# Patient Record
Sex: Female | Born: 1950 | Race: White | Hispanic: No | Smoking: Former smoker
Health system: Southern US, Community
[De-identification: ages and names within clinical notes are randomized; demographics above are authoritative.]

## PROBLEM LIST (undated history)

## (undated) DIAGNOSIS — Z720 Tobacco use: Secondary | ICD-10-CM

## (undated) DIAGNOSIS — R519 Headache, unspecified: Secondary | ICD-10-CM

## (undated) DIAGNOSIS — E559 Vitamin D deficiency, unspecified: Secondary | ICD-10-CM

## (undated) DIAGNOSIS — C801 Malignant (primary) neoplasm, unspecified: Secondary | ICD-10-CM

## (undated) DIAGNOSIS — F32A Depression, unspecified: Secondary | ICD-10-CM

## (undated) DIAGNOSIS — I10 Essential (primary) hypertension: Secondary | ICD-10-CM

## (undated) DIAGNOSIS — T7840XA Allergy, unspecified, initial encounter: Secondary | ICD-10-CM

## (undated) DIAGNOSIS — E7800 Pure hypercholesterolemia, unspecified: Secondary | ICD-10-CM

## (undated) DIAGNOSIS — G4730 Sleep apnea, unspecified: Secondary | ICD-10-CM

## (undated) DIAGNOSIS — K589 Irritable bowel syndrome without diarrhea: Secondary | ICD-10-CM

## (undated) DIAGNOSIS — R7303 Prediabetes: Secondary | ICD-10-CM

## (undated) DIAGNOSIS — J449 Chronic obstructive pulmonary disease, unspecified: Secondary | ICD-10-CM

## (undated) DIAGNOSIS — J45909 Unspecified asthma, uncomplicated: Secondary | ICD-10-CM

## (undated) DIAGNOSIS — K219 Gastro-esophageal reflux disease without esophagitis: Secondary | ICD-10-CM

## (undated) DIAGNOSIS — J40 Bronchitis, not specified as acute or chronic: Secondary | ICD-10-CM

## (undated) DIAGNOSIS — M1990 Unspecified osteoarthritis, unspecified site: Secondary | ICD-10-CM

## (undated) DIAGNOSIS — F419 Anxiety disorder, unspecified: Secondary | ICD-10-CM

## (undated) DIAGNOSIS — S52502A Unspecified fracture of the lower end of left radius, initial encounter for closed fracture: Secondary | ICD-10-CM

## (undated) HISTORY — PX: POLYPECTOMY: SHX149

## (undated) HISTORY — DX: Vitamin D deficiency, unspecified: E55.9

## (undated) HISTORY — DX: Malignant (primary) neoplasm, unspecified: C80.1

## (undated) HISTORY — PX: VAGINAL HYSTERECTOMY: SUR661

## (undated) HISTORY — DX: Tobacco use: Z72.0

## (undated) HISTORY — PX: APPENDECTOMY: SHX54

## (undated) HISTORY — DX: Allergy, unspecified, initial encounter: T78.40XA

## (undated) HISTORY — DX: Gastro-esophageal reflux disease without esophagitis: K21.9

## (undated) HISTORY — DX: Depression, unspecified: F32.A

## (undated) HISTORY — DX: Anxiety disorder, unspecified: F41.9

## (undated) HISTORY — DX: Chronic obstructive pulmonary disease, unspecified: J44.9

## (undated) HISTORY — DX: Unspecified osteoarthritis, unspecified site: M19.90

## (undated) HISTORY — DX: Headache, unspecified: R51.9

## (undated) HISTORY — DX: Irritable bowel syndrome, unspecified: K58.9

## (undated) HISTORY — DX: Bronchitis, not specified as acute or chronic: J40

---

## 1999-02-23 ENCOUNTER — Other Ambulatory Visit: Admission: RE | Admit: 1999-02-23 | Discharge: 1999-02-23 | Payer: Self-pay | Admitting: Obstetrics and Gynecology

## 1999-11-19 ENCOUNTER — Other Ambulatory Visit: Admission: RE | Admit: 1999-11-19 | Discharge: 1999-11-19 | Payer: Self-pay | Admitting: Obstetrics and Gynecology

## 2000-02-03 ENCOUNTER — Ambulatory Visit (HOSPITAL_COMMUNITY): Admission: RE | Admit: 2000-02-03 | Discharge: 2000-02-03 | Payer: Self-pay | Admitting: *Deleted

## 2000-05-07 ENCOUNTER — Emergency Department (HOSPITAL_COMMUNITY): Admission: EM | Admit: 2000-05-07 | Discharge: 2000-05-07 | Payer: Self-pay | Admitting: Emergency Medicine

## 2001-05-02 ENCOUNTER — Emergency Department (HOSPITAL_COMMUNITY): Admission: EM | Admit: 2001-05-02 | Discharge: 2001-05-02 | Payer: Self-pay

## 2001-07-09 ENCOUNTER — Encounter: Payer: Self-pay | Admitting: Emergency Medicine

## 2001-07-09 ENCOUNTER — Emergency Department (HOSPITAL_COMMUNITY): Admission: EM | Admit: 2001-07-09 | Discharge: 2001-07-10 | Payer: Self-pay | Admitting: Emergency Medicine

## 2003-10-08 ENCOUNTER — Ambulatory Visit (HOSPITAL_COMMUNITY): Admission: RE | Admit: 2003-10-08 | Discharge: 2003-10-08 | Payer: Self-pay | Admitting: *Deleted

## 2011-02-25 HISTORY — PX: ESOPHAGOGASTRODUODENOSCOPY: SHX1529

## 2012-08-05 ENCOUNTER — Emergency Department (INDEPENDENT_AMBULATORY_CARE_PROVIDER_SITE_OTHER): Payer: PRIVATE HEALTH INSURANCE

## 2012-08-05 ENCOUNTER — Encounter (HOSPITAL_COMMUNITY): Payer: Self-pay | Admitting: Emergency Medicine

## 2012-08-05 ENCOUNTER — Emergency Department (HOSPITAL_COMMUNITY)
Admission: EM | Admit: 2012-08-05 | Discharge: 2012-08-05 | Disposition: A | Discharge status: Home / Self Care | Payer: PRIVATE HEALTH INSURANCE | Source: Home / Self Care

## 2012-08-05 DIAGNOSIS — J40 Bronchitis, not specified as acute or chronic: Secondary | ICD-10-CM

## 2012-08-05 HISTORY — DX: Essential (primary) hypertension: I10

## 2012-08-05 HISTORY — DX: Unspecified asthma, uncomplicated: J45.909

## 2012-08-05 MED ORDER — ALBUTEROL SULFATE (5 MG/ML) 0.5% IN NEBU
5.0000 mg | INHALATION_SOLUTION | Freq: Once | RESPIRATORY_TRACT | Status: AC
  Administered 2012-08-05: 5 mg via RESPIRATORY_TRACT

## 2012-08-05 MED ORDER — HYDROCOD POLST-CHLORPHEN POLST 10-8 MG/5ML PO LQCR: 5.0000 mL | Freq: Two times a day (BID) | ORAL | Status: DC | PRN

## 2012-08-05 MED ORDER — ALBUTEROL SULFATE (5 MG/ML) 0.5% IN NEBU
INHALATION_SOLUTION | RESPIRATORY_TRACT | Status: AC
  Filled 2012-08-05: qty 1

## 2012-08-05 MED ORDER — ALBUTEROL SULFATE HFA 108 (90 BASE) MCG/ACT IN AERS: 2.0000 | INHALATION_SPRAY | Freq: Four times a day (QID) | RESPIRATORY_TRACT | Status: DC | PRN

## 2012-08-05 MED ORDER — IPRATROPIUM BROMIDE 0.02 % IN SOLN
0.5000 mg | Freq: Once | RESPIRATORY_TRACT | Status: AC
  Administered 2012-08-05: 0.5 mg via RESPIRATORY_TRACT

## 2012-08-05 MED ORDER — AZITHROMYCIN 250 MG PO TABS: 250.0000 mg | ORAL_TABLET | Freq: Every day | ORAL | Status: DC

## 2012-08-05 NOTE — ED Provider Notes (Signed)
Medical screening examination/treatment/procedure(s) were performed by non-physician practitioner and as supervising physician I was immediately available for consultation/collaboration.  Takyia Sindt, M.D.  Argie Lober C Trudie Cervantes, MD 08/05/12 1948 

## 2012-08-05 NOTE — ED Notes (Signed)
Waiting discharge papers 

## 2012-08-05 NOTE — ED Notes (Signed)
Pt c/o sinus pressure and pain. Productive cough. Sob. Wheezing. And low grade temp. Mild diarrhea. Denies nausea, vomiting, and chest pain.  Pt states that she has taken several otc meds with only mild relief of symptoms "symptoms will get better for a few days but then return" Symptoms present for almost the past two months.

## 2012-08-05 NOTE — ED Provider Notes (Signed)
History     CSN: [id number]  Arrival date & time 08/05/12  1151   First MD Initiated Contact with Patient 08/05/12 1240      Chief Complaint  Patient presents with  . URI    sinus pressure and pain. productive cough . sob. wheezing. decrease appetitie.     HPI: Patient is a 62 y.o. female presenting with URI. The history is provided by the patient.  URI Presenting symptoms: congestion, cough, fatigue, fever, rhinorrhea and sore throat   Presenting symptoms: no ear pain and no facial pain   Severity:  Moderate Onset quality:  Gradual Duration:  6 weeks Timing:  Intermittent Progression:  Unchanged Chronicity:  Recurrent Relieved by:  Nothing Associated symptoms: headaches, sneezing and wheezing   Associated symptoms: no myalgias, no neck pain, no sinus pain and no swollen glands   Risk factors: being elderly and chronic respiratory disease   Pt reports 6 wk h/o persistent cough and associated URI sx's. States she will improve then symptoms return. The cough is frequent and she feels "worn out" due to so muching coughing. Pt is a smoker and admits to h/o frequent Bronchitis. Has had intermittent low grade fever (99.9). Denies CP, N/V/D. States she has an episode last night where she coughed so hard and long she felt as if she would not be able to catch her breath.   Past Medical History  Diagnosis Date  . Hypertension   . Asthma     Past Surgical History  Procedure Laterality Date  . Appendectomy    . Abdominal hysterectomy      Family History  Problem Relation Age of Onset  . Heart failure Other   . Diabetes Other     History  Substance Use Topics  . Smoking status: Current Every Day Smoker -- 2.00 packs/day    Types: Cigarettes  . Smokeless tobacco: Not on file  . Alcohol Use: No    OB History   Grav Para Term Preterm Abortions TAB SAB Ect Mult Living                  Review of Systems  Constitutional: Positive for fever and fatigue.  HENT: Positive  for congestion, sore throat, rhinorrhea and sneezing. Negative for ear pain, nosebleeds, neck pain and ear discharge.   Eyes: Negative.   Respiratory: Positive for cough and wheezing. Negative for chest tightness.   Cardiovascular: Negative for chest pain.  Gastrointestinal: Negative.   Endocrine: Negative.   Genitourinary: Negative.   Musculoskeletal: Negative for myalgias.  Allergic/Immunologic: Negative.   Neurological: Positive for headaches.  Hematological: Negative.   Psychiatric/Behavioral: Negative.     Allergies  Penicillins  Home Medications   Current Outpatient Rx  Name  Route  Sig  Dispense  Refill  . citalopram (CELEXA) 20 MG tablet   Oral   Take 20 mg by mouth daily.         Marland Kitchen lisinopril-hydrochlorothiazide (PRINZIDE,ZESTORETIC) 20-25 MG per tablet   Oral   Take 1 tablet by mouth daily.         Marland Kitchen OMEPRAZOLE PO   Oral   Take by mouth.         Marland Kitchen albuterol (PROVENTIL HFA;VENTOLIN HFA) 108 (90 BASE) MCG/ACT inhaler   Inhalation   Inhale 2 puffs into the lungs every 6 (six) hours as needed for wheezing.   1 Inhaler   2   . azithromycin (ZITHROMAX Z-PAK) 250 MG tablet   Oral   Take 1  tablet (250 mg total) by mouth daily. Take 2 tabs on day 1 then 1 tab on days 2-5.   6 tablet   0   . chlorpheniramine-HYDROcodone (TUSSIONEX PENNKINETIC ER) 10-8 MG/5ML LQCR   Oral   Take 5 mLs by mouth every 12 (twelve) hours as needed.   30 mL   0     BP 154/76  Pulse 77  Temp(Src) 99.1 F (37.3 C) (Oral)  Resp 22  SpO2 99%  Physical Exam  Nursing note and vitals reviewed. Constitutional: She is oriented to person, place, and time. She appears well-developed and well-nourished.  HENT:  Head: Normocephalic and atraumatic.  Right Ear: Tympanic membrane, external ear and ear canal normal.  Left Ear: Tympanic membrane, external ear and ear canal normal.  Nose: Nose normal. Right sinus exhibits no maxillary sinus tenderness and no frontal sinus tenderness.  Left sinus exhibits no maxillary sinus tenderness and no frontal sinus tenderness.  Mouth/Throat: Uvula is midline, oropharynx is clear and moist and mucous membranes are normal.  Eyes: Conjunctivae are normal.  Neck: Neck supple.  Cardiovascular: Normal rate and regular rhythm.   Pulmonary/Chest: Effort normal. She has wheezes.  BBS coarsse with inspiratory and expiratory wheezes.  Musculoskeletal: Normal range of motion.  Neurological: She is alert and oriented to person, place, and time.  Skin: Skin is warm and dry.  Psychiatric: She has a normal mood and affect.    ED Course  Procedures (including critical care time)  Labs Reviewed - No data to display Dg Chest 2 View  08/05/2012  *RADIOLOGY REPORT*  Clinical Data: Cough, shortness of breath.  CHEST - 2 VIEW  Comparison: None  Findings: Heart and mediastinal contours are within normal limits. No focal opacities or effusions.  No acute bony abnormality.  IMPRESSION: No active cardiopulmonary disease.   Original Report Authenticated By: Charlett Nose, M.D.      1. Bronchitis       MDM  HPI and PE c/w Bronchitis. CXR w/o acute findings. BBS much improved w/ Albuterol/Atrovent neb. Will treat w/ Albuterol HFA, Z-Pack and short course of medication for cough to use primarily at night. Pt counseled regarding nedd to stop smoking. Info provided. Pt encouraged to continue her efforts at getting established w/ a PCP and until that time return here as needed.        Leanne Chang, NP 08/05/12 1511

## 2013-06-15 ENCOUNTER — Ambulatory Visit (INDEPENDENT_AMBULATORY_CARE_PROVIDER_SITE_OTHER): Payer: No Typology Code available for payment source | Admitting: Internal Medicine

## 2013-06-15 ENCOUNTER — Ambulatory Visit: Payer: No Typology Code available for payment source

## 2013-06-15 ENCOUNTER — Telehealth: Payer: Self-pay

## 2013-06-15 VITALS — BP 132/64 | HR 84 | Temp 98.7°F | Resp 16 | Ht 62.5 in

## 2013-06-15 DIAGNOSIS — R062 Wheezing: Secondary | ICD-10-CM

## 2013-06-15 DIAGNOSIS — E663 Overweight: Secondary | ICD-10-CM | POA: Insufficient documentation

## 2013-06-15 DIAGNOSIS — L519 Erythema multiforme, unspecified: Secondary | ICD-10-CM

## 2013-06-15 DIAGNOSIS — T7840XA Allergy, unspecified, initial encounter: Secondary | ICD-10-CM

## 2013-06-15 DIAGNOSIS — F17200 Nicotine dependence, unspecified, uncomplicated: Secondary | ICD-10-CM | POA: Insufficient documentation

## 2013-06-15 LAB — COMPREHENSIVE METABOLIC PANEL
BUN: 15 mg/dL (ref 6–23)
CO2: 25 mEq/L (ref 19–32)
Calcium: 9 mg/dL (ref 8.4–10.5)
Chloride: 102 mEq/L (ref 96–112)
Creat: 1.02 mg/dL (ref 0.50–1.10)
Glucose, Bld: 198 mg/dL — ABNORMAL HIGH (ref 70–99)

## 2013-06-15 LAB — POCT CBC
Hemoglobin: 10.3 g/dL — AB (ref 12.2–16.2)
Lymph, poc: 2.5 (ref 0.6–3.4)
MCH, POC: 21.7 pg — AB (ref 27–31.2)
MCV: 75.9 fL — AB (ref 80–97)
POC LYMPH PERCENT: 19.8 %L (ref 10–50)
Platelet Count, POC: 352 10*3/uL (ref 142–424)
RBC: 4.74 M/uL (ref 4.04–5.48)

## 2013-06-15 MED ORDER — EPINEPHRINE 0.3 MG/0.3ML IJ SOAJ
0.3000 mg | Freq: Once | INTRAMUSCULAR | Status: AC
  Administered 2013-06-15: 0.3 mg via SUBCUTANEOUS

## 2013-06-15 MED ORDER — METHYLPREDNISOLONE ACETATE 80 MG/ML IJ SUSP
80.0000 mg | Freq: Once | INTRAMUSCULAR | Status: AC
  Administered 2013-06-15: 80 mg via INTRAMUSCULAR

## 2013-06-15 MED ORDER — AZITHROMYCIN 250 MG PO TABS: 250.0000 mg | ORAL_TABLET | Freq: Every day | ORAL | Status: DC

## 2013-06-15 MED ORDER — PREDNISONE 20 MG PO TABS: ORAL_TABLET | ORAL | Status: DC

## 2013-06-15 NOTE — Patient Instructions (Signed)
Zyrtec  Twice a day for 5 days then once a day for 3 weeks.

## 2013-06-15 NOTE — Telephone Encounter (Signed)
Patient is wanting to make sure she received all the medications prescribed from her office visit today with Dr. Merla Riches. She received; predniSONE (DELTASONE) 20 MG tablet and  azithromycin (ZITHROMAX Z-PAK) 250 MG tablet She was confused on whether she is supposed to get the chlorpheniramine-HYDROcodone [facility]) 10-8 MG/5ML [facility]  also because she saw it on her AVS as current medications. Please advise, I told her we could check to make sure.   Best: [phone]

## 2013-06-15 NOTE — Progress Notes (Signed)
Subjective:    Patient ID: Tiffany Murphy, female    DOB: August 30, 1950, 62 y.o.   MRN: [id number]  HPI called by staff to see patient urgently as she was describing an allergic reaction that was beginning to affect her ability to breathe She has noted swelling and redness of her hands with red lesions on her arms for the past 36 hours This is very pruritic She has been exposed to lots of soaps while washing dishes and has a history of allergic reactions to various soap products She has also had tightness in her chest with an increased cough and some dyspnea on exertion for about a month No fever or night sweats No weight loss no new medications or other new exposures  There are no active problems to display for this patient.  she is a smoker with a history of reactive airway disease who is on Celexa for depression but no other chronic medications  Past Medical History  Diagnosis Date  . Hypertension   . Asthma        Review of Systems  Constitutional: Negative for fever, chills, activity change, appetite change, fatigue and unexpected weight change.  HENT: Negative for facial swelling, hearing loss, postnasal drip, rhinorrhea and sinus pressure.   Eyes: Negative for visual disturbance.  Respiratory: Negative for choking.   Cardiovascular: Negative for chest pain, palpitations and leg swelling.  Gastrointestinal: Negative for abdominal pain.  Genitourinary: Negative for difficulty urinating.  Musculoskeletal: Negative for arthralgias.       Objective:   Physical Exam BP 132/64  Pulse 84  Temp(Src) 98.7 F (37.1 C) (Oral)  Resp 16  Ht 5' 2.5" (1.588 m)  SpO2 96% She appears in no acute distress TMs clear/conjunctiva not inflamed/nares slightly boggy/throat clear/no nodes Chest with wheezing on inspiration and expiration bilaterally No exacerbation with wheezing on forced expiration and no rhonchi rales Heart regular without murmur No carotid bruits Abdomen  supple Skin exam reveals moderate swelling of both hands with target lesions on the palms and hives on the forearms A left forearm there is an area that has been irritated by her scratching which is much more purpuric-looking No regional adenopathy No peripheral edema Sensorium intact  Her rash did not respond to epinephrine but her lungs were more clear     [facility] reading (PRIMARY) by  Dr. Laney Pastor no acute distress  Results for orders placed in visit on 06/15/13  POCT CBC      Result Value Range   WBC 12.5 (*) 4.6 - 10.2 K/uL   Lymph, poc 2.5  0.6 - 3.4   POC LYMPH PERCENT 19.8  10 - 50 %L   MID (cbc) 0.6  0 - 0.9   POC MID % 5.2  0 - 12 %M   POC Granulocyte 9.4 (*) 2 - 6.9   Granulocyte percent 75.0  37 - 80 %G   RBC 4.74  4.04 - 5.48 M/uL   Hemoglobin 10.3 (*) 12.2 - 16.2 g/dL   HCT, POC 36.0 (*) 37.7 - 47.9 %   MCV 75.9 (*) 80 - 97 fL   MCH, POC 21.7 (*) 27 - 31.2 pg   MCHC 28.6 (*) 31.8 - 35.4 g/dL   RDW, POC 18.4     Platelet Count, POC 352  142 - 424 K/uL   MPV 8.8  0 - 99.8 fL       Assessment & Plan:  Allergic reaction, initial encounter - Plan: EPINEPHrine (EPI-PEN) injection 0.3 mg  Wheezing - Plan: DG Chest 2 View, methylPREDNISolone acetate (DEPO-MEDROL) injection 80 mg  ? Bronchitis exacerbation in a smoker Erythema multiforme - Plan: POCT CBC, Comprehensive metabolic panel, methylPREDNISolone acetate (DEPO-MEDROL) injection 80 mg  ? Viral reaction versus reaction to infection versus allergic reaction Smoker  Anemia-low MCV   Given 80 mg of Depo-Medrol in the office  Meds ordered this encounter  Medications  . EPINEPHrine (EPI-PEN) injection 0.3 mg    Sig:   . methylPREDNISolone acetate (DEPO-MEDROL) injection 80 mg    Sig:   . predniSONE (DELTASONE) 20 MG tablet    Sig: 3/3/2/2/1/1 single daily dose for 6 days    Dispense:  12 tablet    Refill:  0  . azithromycin (ZITHROMAX Z-PAK) 250 MG tablet    Sig: Take 1 tablet (250 mg total) by mouth  daily. Take 2 tabs on day 1 then 1 tab on days 2-5.    Dispense:  6 tablet    Refill:  0    Order Specific Question:  Supervising Provider    Answer:  Jeryl Columbia [[id number]]   Zyrtec 10 mg twice a day for 5 days and then once a day for one to 2 months Followup 48 hours Metabolic profile ordered She will need to address her problem with anemia and smoking and we will establish this in followup

## 2013-06-16 NOTE — Telephone Encounter (Signed)
Spoke with patient and was notified tussionex was medication that was prescribed in past and still showed on medication list

## 2013-07-11 ENCOUNTER — Other Ambulatory Visit: Payer: Self-pay | Admitting: *Deleted

## 2013-07-11 ENCOUNTER — Ambulatory Visit (INDEPENDENT_AMBULATORY_CARE_PROVIDER_SITE_OTHER): Payer: No Typology Code available for payment source | Admitting: Family Medicine

## 2013-07-11 ENCOUNTER — Encounter: Payer: Self-pay | Admitting: Family Medicine

## 2013-07-11 VITALS — BP 138/73 | HR 73 | Temp 98.2°F | Resp 16 | Ht 62.5 in | Wt 189.0 lb

## 2013-07-11 DIAGNOSIS — F341 Dysthymic disorder: Secondary | ICD-10-CM

## 2013-07-11 DIAGNOSIS — F418 Other specified anxiety disorders: Secondary | ICD-10-CM | POA: Insufficient documentation

## 2013-07-11 DIAGNOSIS — R739 Hyperglycemia, unspecified: Secondary | ICD-10-CM

## 2013-07-11 DIAGNOSIS — R7309 Other abnormal glucose: Secondary | ICD-10-CM

## 2013-07-11 DIAGNOSIS — I10 Essential (primary) hypertension: Secondary | ICD-10-CM | POA: Insufficient documentation

## 2013-07-11 DIAGNOSIS — E7800 Pure hypercholesterolemia, unspecified: Secondary | ICD-10-CM

## 2013-07-11 DIAGNOSIS — Z23 Encounter for immunization: Secondary | ICD-10-CM

## 2013-07-11 DIAGNOSIS — F17200 Nicotine dependence, unspecified, uncomplicated: Secondary | ICD-10-CM

## 2013-07-11 LAB — POCT GLYCOSYLATED HEMOGLOBIN (HGB A1C): Hemoglobin A1C: 5.9

## 2013-07-11 MED ORDER — LISINOPRIL-HYDROCHLOROTHIAZIDE 20-25 MG PO TABS
1.0000 | ORAL_TABLET | Freq: Every day | ORAL | Status: DC
Start: 2013-07-11 — End: 2013-09-10

## 2013-07-11 MED ORDER — CITALOPRAM HYDROBROMIDE 40 MG PO TABS: 40.0000 mg | ORAL_TABLET | Freq: Every day | ORAL | Status: DC

## 2013-07-11 MED ORDER — ALBUTEROL SULFATE HFA 108 (90 BASE) MCG/ACT IN AERS: 2.0000 | INHALATION_SPRAY | Freq: Four times a day (QID) | RESPIRATORY_TRACT | Status: DC | PRN

## 2013-07-11 NOTE — Progress Notes (Signed)
Subjective:    Patient ID: Tiffany Murphy, female    DOB: 1950-07-14, 63 y.o.   MRN: [id number]  HPI  This 63 y.o. Cauc female is here to establish care at [facility]. Previous care at [facility] in [place], [place]. Current medical problems include HTN, chronic depression and anxiety and nicotine addiction. She is using e-cigs for smoking cessation. Gained 30 lbs last time she quit smoking.  HTN- Pt out of medication 2 days but asymptomatic. No report of vision changes, diaphoresis, CP or tightness, palpitations, dizziness, numbness, facial asymmetry, weakness or syncope. Was advised that her blood sugar was high when she was seen at 89  In Dec 2014. No hx of DM, polyphagia or polydipsia. Has had polyuria w/o dysuria or urgency.  Chronic depression requiring medication for years; she has significant anxiety and feels "wound up". Relaxation- reading. Plans to get more active w/ her church group.   Hypercholesterolemia- The pt has been compliant w/ medications but stopped Lovastatin due to muscle cramps; mild cramps became severe when medication dose was increased.  Pt wants Flu vaccine but having mild throat irritation, frontal HA, itchy eyes and hx of allergies. She denies fever/ chills but has a slightly prod cough due to tobacco use.  Review of Systems  Constitutional: Positive for appetite change. Negative for diaphoresis and fatigue.  HENT: Positive for congestion, postnasal drip, sinus pressure and sore throat. Negative for ear pain and trouble swallowing.   Eyes: Positive for itching. Negative for redness and visual disturbance.  Cardiovascular: Negative.   Genitourinary: Positive for frequency.  Musculoskeletal: Positive for myalgias. Negative for arthralgias, back pain and gait problem.  Skin: Negative.   Neurological: Negative.   Hematological: Negative.   Psychiatric/Behavioral: Positive for sleep disturbance and dysphoric mood. Negative for suicidal ideas, behavioral problems,  confusion, self-injury and agitation. The patient is nervous/anxious. The patient is not hyperactive.        Objective:   Physical Exam  Nursing note and vitals reviewed. Constitutional: She is oriented to person, place, and time. Vital signs are normal. She appears well-developed and well-nourished. No distress.  HENT:  Head: Normocephalic and atraumatic.  Right Ear: Hearing, tympanic membrane, external ear and ear canal normal.  Left Ear: Hearing, tympanic membrane, external ear and ear canal normal.  Nose: Nose normal. No nasal deformity or septal deviation. Right sinus exhibits no maxillary sinus tenderness and no frontal sinus tenderness. Left sinus exhibits no maxillary sinus tenderness and no frontal sinus tenderness.  Mouth/Throat: Uvula is midline and mucous membranes are normal. No oral lesions. Dental caries present. No uvula swelling. Posterior oropharyngeal erythema present. No oropharyngeal exudate.  Eyes: Conjunctivae, EOM and lids are normal. Pupils are equal, round, and reactive to light. No scleral icterus.  Neck: Trachea normal and normal range of motion. Neck supple. No spinous process tenderness and no muscular tenderness present. No mass and no thyromegaly present.  Cardiovascular: Normal rate, regular rhythm, S1 normal, S2 normal and normal heart sounds.   No extrasystoles are present. PMI is not displaced.  Exam reveals no gallop and no friction rub.   No murmur heard. Pulmonary/Chest: Effort normal and breath sounds normal. No respiratory distress. She has no decreased breath sounds. She has no wheezes. She has no rhonchi.  Musculoskeletal: Normal range of motion. She exhibits no edema and no tenderness.  Neurological: She is alert and oriented to person, place, and time. She has normal strength. She displays no atrophy. No cranial nerve deficit or sensory  deficit. She exhibits normal muscle tone. Coordination and gait normal.  Skin: Skin is warm, dry and intact. No rash  noted. She is not diaphoretic. No cyanosis or erythema. No pallor.  Psychiatric: Her speech is normal and behavior is normal. Judgment and thought content normal. Her mood appears anxious. Her affect is not angry, not blunt and not inappropriate. Cognition and memory are normal. She does not exhibit a depressed mood.    Results for orders placed in visit on 07/11/13  POCT GLYCOSYLATED HEMOGLOBIN (HGB A1C)      Result Value Range   Hemoglobin A1C 5.9         Assessment & Plan:  Benign essential HTN-  RX: Lisinopril- HCTZ 20-25  1 tablet daily.  Depression with anxiety- Increase Citalopram to 40 mg; if this dose is too strong, reduce to 1/2 tablet.  Work on lifestyle changes- better nutrition and staying active.  Hyperglycemia - Advised about diet modifications and regular exercise.  Plan: POCT glycosylated hemoglobin (Hb A1C)  Nicotine addiction- Continue current plan but plan o discontinuing this product within few months.  Pure hypercholesterolemia- Will not prescribe another statin at this time due to side effect. Increase Fish Oil to 2 grams daily; dietary modifications as noted. Will check fasting lipids at future visit.  Need for prophylactic vaccination and inoculation against influenza - Plan: Flu Vaccine QUAD 36+ mos IM  Meds ordered this encounter  Medications  . cetirizine (ZYRTEC) 10 MG tablet    Sig: Take 10 mg by mouth daily.  . potassium gluconate 595 MG TABS tablet- OTC supplement    Sig: Take 595 mg by mouth.  . cyanocobalamin 2000 MCG tablet    Sig: Take 2,500 mcg by mouth daily.  . Fish Oil-Cholecalciferol (OMEGA-3 FISH OIL/VITAMIN D3 PO)    Sig: Take by mouth.  . calcium carbonate (OS-CAL) 600 MG TABS tablet    Sig: Take 600 mg by mouth 2 (two) times daily with a meal.  . albuterol (PROVENTIL HFA;VENTOLIN HFA) 108 (90 BASE) MCG/ACT inhaler    Sig: Inhale 2 puffs into the lungs every 6 (six) hours as needed for wheezing.    Dispense:  1 Inhaler    Refill:  2              . citalopram (CELEXA) 40 MG tablet    Sig: Take 1 tablet (40 mg total) by mouth daily.    Dispense:  30 tablet    Refill:  5   ROI signed for records from [facility] in [place], [place]. (requesting last 3 years of service).

## 2013-07-11 NOTE — Patient Instructions (Signed)
You Can Quit Smoking If you are ready to quit smoking or are thinking about it, congratulations! You have chosen to help yourself be healthier and live longer! There are lots of different ways to quit smoking. Nicotine gum, nicotine patches, a nicotine inhaler, or nicotine nasal spray can help with physical craving. Hypnosis, support groups, and medicines help break the habit of smoking. TIPS TO GET OFF AND STAY OFF CIGARETTES  Learn to predict your moods. Do not let a bad situation be your excuse to have a cigarette. Some situations in your life might tempt you to have a cigarette.  Ask friends and co-workers not to smoke around you.  Make your home smoke-free.  Never have "just one" cigarette. It leads to wanting another and another. Remind yourself of your decision to quit.  On a card, make a list of your reasons for not smoking. Read it at least the same number of times a day as you have a cigarette. Tell yourself everyday, "I do not want to smoke. I choose not to smoke."  Ask someone at home or work to help you with your plan to quit smoking.  Have something planned after you eat or have a cup of coffee. Take a walk or get other exercise to perk you up. This will help to keep you from overeating.  Try a relaxation exercise to calm you down and decrease your stress. Remember, you may be tense and nervous the first two weeks after you quit. This will pass.  Find new activities to keep your hands busy. Play with a pen, coin, or rubber band. Doodle or draw things on paper.  Brush your teeth right after eating. This will help cut down the craving for the taste of tobacco after meals. You can try mouthwash too.  Try gum, breath mints, or diet candy to keep something in your mouth. IF YOU SMOKE AND WANT TO QUIT:  Do not stock up on cigarettes. Never buy a carton. Wait until one pack is finished before you buy another.  Never carry cigarettes with you at work or at home.  Keep cigarettes  as far away from you as possible. Leave them with someone else.  Never carry matches or a lighter with you.  Ask yourself, "Do I need this cigarette or is this just a reflex?"  Bet with someone that you can quit. Put cigarette money in a piggy bank every morning. If you smoke, you give up the money. If you do not smoke, by the end of the week, you keep the money.  Keep trying. It takes 21 days to change a habit!  Talk to your doctor about using medicines to help you quit. These include nicotine replacement gum, lozenges, or skin patches. Document Released: 04/02/2009 Document Revised: 08/29/2011 Document Reviewed: 04/02/2009 [facility] Patient Information 2014 [place].    Exercise to Lose Weight Exercise and a healthy diet may help you lose weight. Your doctor may suggest specific exercises. EXERCISE IDEAS AND TIPS  Choose low-cost things you enjoy doing, such as walking, bicycling, or exercising to workout videos.  Take stairs instead of the elevator.  Walk during your lunch break.  Park your car further away from work or school.  Go to a gym or an exercise class.  Start with 5 to 10 minutes of exercise each day. Build up to 30 minutes of exercise 4 to 6 days a week.  Wear shoes with good support and comfortable clothes.  Stretch before and after  working out.  Work out until you breathe harder and your heart beats faster.  Drink extra water when you exercise.  Do not do so much that you hurt yourself, feel dizzy, or get very short of breath. Exercises that burn about 150 calories:  Running 1  miles in 15 minutes.  Playing volleyball for 45 to 60 minutes.  Washing and waxing a car for 45 to 60 minutes.  Playing touch football for 45 minutes.  Walking 1  miles in 35 minutes.  Pushing a stroller 1  miles in 30 minutes.  Playing basketball for 30 minutes.  Raking leaves for 30 minutes.  Bicycling 5 miles in 30 minutes.  Walking 2 miles in 30  minutes.  Dancing for 30 minutes.  Shoveling snow for 15 minutes.  Swimming laps for 20 minutes.  Walking up stairs for 15 minutes.  Bicycling 4 miles in 15 minutes.  Gardening for 30 to 45 minutes.  Jumping rope for 15 minutes.  Washing windows or floors for 45 to 60 minutes. Document Released: 07/09/2010 Document Revised: 08/29/2011 Document Reviewed: 07/09/2010 [facility] Patient Information 2014 [place], [place].   I have prescribed CELEXA 40 mg (Citalopram is the generic) 1 tablet every day to try to decrease your anxiety. If this dose is too high, reduce the dose back to 20 mg by taking 1/2 tablet. Work on better nutrition -more fruits and vegetables, less processed foods and fried foods, sodas and other sugary drinks and starchy foods (breads, rice, potatoes and pasta. Try to avoid unhealthy snack foods.  Try to get more active and get involved in your church again. Helping others always make you feel better!

## 2013-08-01 ENCOUNTER — Ambulatory Visit (INDEPENDENT_AMBULATORY_CARE_PROVIDER_SITE_OTHER): Payer: No Typology Code available for payment source | Admitting: Family Medicine

## 2013-08-01 VITALS — BP 146/79 | HR 85 | Temp 97.8°F | Resp 18 | Ht 60.75 in | Wt 182.4 lb

## 2013-08-01 DIAGNOSIS — A084 Viral intestinal infection, unspecified: Secondary | ICD-10-CM

## 2013-08-01 DIAGNOSIS — E860 Dehydration: Secondary | ICD-10-CM

## 2013-08-01 DIAGNOSIS — R197 Diarrhea, unspecified: Secondary | ICD-10-CM

## 2013-08-01 DIAGNOSIS — R110 Nausea: Secondary | ICD-10-CM

## 2013-08-01 LAB — POCT CBC
GRANULOCYTE PERCENT: 78.4 % (ref 37–80)
HCT, POC: 40.9 % (ref 37.7–47.9)
HEMOGLOBIN: 12 g/dL — AB (ref 12.2–16.2)
Lymph, poc: 1.2 (ref 0.6–3.4)
MCH: 22.1 pg — AB (ref 27–31.2)
MCHC: 29.3 g/dL — AB (ref 31.8–35.4)
MCV: 75.5 fL — AB (ref 80–97)
MID (CBC): 0.5 (ref 0–0.9)
MPV: 8.9 fL (ref 0–99.8)
PLATELET COUNT, POC: 351 10*3/uL (ref 142–424)
POC GRANULOCYTE: 6 (ref 2–6.9)
POC LYMPH PERCENT: 15.5 %L (ref 10–50)
POC MID %: 6.1 % (ref 0–12)
RBC: 5.42 M/uL (ref 4.04–5.48)
RDW, POC: 20.7 %
WBC: 7.7 10*3/uL (ref 4.6–10.2)

## 2013-08-01 LAB — GLUCOSE, POCT (MANUAL RESULT ENTRY): POC GLUCOSE: 79 mg/dL (ref 70–99)

## 2013-08-01 MED ORDER — ONDANSETRON 4 MG PO TBDP: ORAL_TABLET | ORAL | Status: DC

## 2013-08-01 MED ORDER — ONDANSETRON 4 MG PO TBDP
4.0000 mg | ORAL_TABLET | Freq: Once | ORAL | Status: AC
  Administered 2013-08-01: 4 mg via ORAL

## 2013-08-01 NOTE — Progress Notes (Signed)
Subjective: 63 year old lady who works at an [employer]. Yesterday morning she started having diarrhea. She had missed work yesterday and today. She has had severe diarrhea the whole time. She had nausea and vomiting yesterday, nauseated today. She has had chills and sweats but no documented fever. She has not eaten anything but drank a little Liquids which is she feels like just goes right through her. Active bowel sounds but not having major cramping or pain. She has a history of some GI problems and GERD for which she is on omeprazole. She tends toward having diarrheal stools anyhow. She took some Emetrol, no other medications directly for this. She says she is extremely thirsty.  Objective: Somewhat ill-appearing lady. Her TMs are normal. Throat clear. Neck supple without nodes. Chest is clear. Heart regular, slightly tachycardic. Abdomen has normal active bowel sounds, soft without masses or tenderness.  Assessment: Acute gastroenteritis with diarrhea and vomiting Mild to moderate dehydration Mild tachycardia  Plan: IV fluids CBC and glucose Zofran 4 mg by mouth

## 2013-08-01 NOTE — Patient Instructions (Signed)
Take Imodium 2 pills initially, then one every 6 hours as needed  Takes Zofran every 4-6 hours if needed for nausea and vomiting  Continue to try to stay well hydrated  Viral Gastroenteritis Viral gastroenteritis is also known as stomach flu. This condition affects the stomach and intestinal tract. It can cause sudden diarrhea and vomiting. The illness typically lasts 3 to 8 days. Most people develop an immune response that eventually gets rid of the virus. While this natural response develops, the virus can make you quite ill. CAUSES  Many different viruses can cause gastroenteritis, such as rotavirus or noroviruses. You can catch one of these viruses by consuming contaminated food or water. You may also catch a virus by sharing utensils or other personal items with an infected person or by touching a contaminated surface. SYMPTOMS  The most common symptoms are diarrhea and vomiting. These problems can cause a severe loss of body fluids (dehydration) and a body salt (electrolyte) imbalance. Other symptoms may include:  Fever.  Headache.  Fatigue.  Abdominal pain. DIAGNOSIS  Your caregiver can usually diagnose viral gastroenteritis based on your symptoms and a physical exam. A stool sample may also be taken to test for the presence of viruses or other infections. TREATMENT  This illness typically goes away on its own. Treatments are aimed at rehydration. The most serious cases of viral gastroenteritis involve vomiting so severely that you are not able to keep fluids down. In these cases, fluids must be given through an intravenous line (IV). HOME CARE INSTRUCTIONS   Drink enough fluids to keep your urine clear or pale yellow. Drink small amounts of fluids frequently and increase the amounts as tolerated.  Ask your caregiver for specific rehydration instructions.  Avoid:  Foods high in sugar.  Alcohol.  Carbonated drinks.  Tobacco.  Juice.  Caffeine drinks.  Extremely hot  or cold fluids.  Fatty, greasy foods.  Too much intake of anything at one time.  Dairy products until 24 to 48 hours after diarrhea stops.  You may consume probiotics. Probiotics are active cultures of beneficial bacteria. They may lessen the amount and number of diarrheal stools in adults. Probiotics can be found in yogurt with active cultures and in supplements.  Wash your hands well to avoid spreading the virus.  Only take over-the-counter or prescription medicines for pain, discomfort, or fever as directed by your caregiver. Do not give aspirin to children. Antidiarrheal medicines are not recommended.  Ask your caregiver if you should continue to take your regular prescribed and over-the-counter medicines.  Keep all follow-up appointments as directed by your caregiver. SEEK IMMEDIATE MEDICAL CARE IF:   You are unable to keep fluids down.  You do not urinate at least once every 6 to 8 hours.  You develop shortness of breath.  You notice blood in your stool or vomit. This may look like coffee grounds.  You have abdominal pain that increases or is concentrated in one small area (localized).  You have persistent vomiting or diarrhea.  You have a fever.  The patient is a child younger than 3 months, and he or she has a fever.  The patient is a child older than 3 months, and he or she has a fever and persistent symptoms.  The patient is a child older than 3 months, and he or she has a fever and symptoms suddenly get worse.  The patient is a baby, and he or she has no tears when crying. MAKE SURE YOU:  Understand these instructions.  Will watch your condition.  Will get help right away if you are not doing well or get worse. Document Released: 06/06/2005 Document Revised: 08/29/2011 Document Reviewed: 03/23/2011 [facility] Patient Information 2014 [place].

## 2013-08-14 ENCOUNTER — Encounter: Payer: Self-pay | Admitting: Family Medicine

## 2013-09-10 ENCOUNTER — Ambulatory Visit (INDEPENDENT_AMBULATORY_CARE_PROVIDER_SITE_OTHER): Payer: No Typology Code available for payment source | Admitting: Family Medicine

## 2013-09-10 ENCOUNTER — Encounter: Payer: Self-pay | Admitting: Family Medicine

## 2013-09-10 VITALS — BP 107/72 | HR 79 | Temp 98.2°F | Resp 16 | Ht 61.75 in | Wt 189.0 lb

## 2013-09-10 DIAGNOSIS — Z862 Personal history of diseases of the blood and blood-forming organs and certain disorders involving the immune mechanism: Secondary | ICD-10-CM

## 2013-09-10 DIAGNOSIS — Z8639 Personal history of other endocrine, nutritional and metabolic disease: Secondary | ICD-10-CM

## 2013-09-10 DIAGNOSIS — G8929 Other chronic pain: Secondary | ICD-10-CM

## 2013-09-10 DIAGNOSIS — I10 Essential (primary) hypertension: Secondary | ICD-10-CM

## 2013-09-10 DIAGNOSIS — R109 Unspecified abdominal pain: Secondary | ICD-10-CM

## 2013-09-10 MED ORDER — LISINOPRIL-HYDROCHLOROTHIAZIDE 20-25 MG PO TABS: 1.0000 | ORAL_TABLET | Freq: Every day | ORAL | Status: DC

## 2013-09-11 NOTE — Progress Notes (Signed)
S:  This 63 y.o. Cauc female is here for HTN follow-up. She is compliant w/ medication w/o adverse effects. Her weight is unchanged. She denies fatigue, diaphoresis, CP or tightness, palpitations, SOB or DOE, cough, orthopnea, edema, abd/back pain, HA, dizziness, asymmetric numbness, weakness or syncope.  She has hx of elevated cholesterol but not following TLCs (therapeutic lifestyle changes). She does not tolerate statins but is taking fish oil supplement.  Pt c/o chronic bilateral flank pain, not aggravated by coughing or sneezing. Certain movement do seem to cause discomfort and she endorses chronic mid-back pain. She does have intermittent nausea but no vomiting or change in stools. Hx of tobacco use. No hx of trauma.  Patient Active Problem List   Diagnosis Date Noted  . Benign essential HTN 07/11/2013  . Depression with anxiety 07/11/2013  . Pure hypercholesterolemia 07/11/2013  . Nicotine addiction 06/15/2013  . Overweight 06/15/2013   Prior to Admission medications   Medication Sig Start Date End Date Taking? Authorizing Provider  albuterol (PROVENTIL HFA;VENTOLIN HFA) 108 (90 BASE) MCG/ACT inhaler Inhale 2 puffs into the lungs every 6 (six) hours as needed for wheezing. 07/11/13  Yes Barton Fanny, MD  calcium carbonate (OS-CAL) 600 MG TABS tablet Take 600 mg by mouth 2 (two) times daily with a meal.   Yes Historical Provider, MD  cetirizine (ZYRTEC) 10 MG tablet Take 10 mg by mouth daily.   Yes Historical Provider, MD  citalopram (CELEXA) 40 MG tablet Take 1 tablet (40 mg total) by mouth daily. 07/11/13  Yes Barton Fanny, MD  Fish Oil-Cholecalciferol (OMEGA-3 FISH OIL/VITAMIN D3 PO) Take by mouth.   Yes Historical Provider, MD  lisinopril-hydrochlorothiazide (PRINZIDE,ZESTORETIC) 20-25 MG per tablet Take 1 tablet by mouth daily.   Yes Barton Fanny, MD  OMEPRAZOLE PO Take 20 mg by mouth.    Yes Historical Provider, MD  ondansetron (ZOFRAN ODT) 4 MG disintegrating  tablet Take one every 4-6 hours as needed for nausea or vomiting 08/01/13  Yes Posey Boyer, MD  potassium gluconate 595 MG TABS tablet Take 595 mg by mouth.   Yes Historical Provider, MD  cyanocobalamin 2000 MCG tablet Take 2,500 mcg by mouth daily.    Historical Provider, MD   PMHx, Surg Hx, Soc and Fam Hx reviewed.  ROS:As per HPI.  O: Filed Vitals:   09/10/13 1609  BP: 107/72  Pulse: 79  Temp: 98.2 F (36.8 C)  Resp: 16   GEN: In NAD: WN,WD. HENT: [place]/AT; EOMI w/ clear conj/sclerae. EACs/canals/nares clear. Post ph w/ mild erythema. COR: RRR. Normal S1 and S2.   LUNGS: CTA; Normal resp rate and effort. ABD: Soft and NT; normal BS. No guarding. Flank tenderness along ribs. BACK: Spine is straight; paravertebral muscles tender. NEURO: A&O x 3; CNs intact. Nonfocal.  A/P: Benign essential HTN- Stable on current medications.  History of hypercholesterolemia- Encouraged TLCs.  Advised A1c= 5.9%. Needs to address weight issue and improve nutrition.  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 Will do xrays at next visit.  Meds ordered this encounter  Medications  . lisinopril-hydrochlorothiazide (PRINZIDE,ZESTORETIC) 20-25 MG per tablet    Sig: Take 1 tablet by mouth daily.    Dispense:  30 tablet    Refill:  5

## 2014-01-22 ENCOUNTER — Encounter: Payer: Self-pay | Admitting: Family Medicine

## 2014-01-22 ENCOUNTER — Ambulatory Visit (INDEPENDENT_AMBULATORY_CARE_PROVIDER_SITE_OTHER): Payer: No Typology Code available for payment source | Admitting: Family Medicine

## 2014-01-22 VITALS — BP 124/68 | HR 71 | Temp 98.7°F | Resp 16 | Ht 62.0 in | Wt 176.0 lb

## 2014-01-22 DIAGNOSIS — R5381 Other malaise: Secondary | ICD-10-CM

## 2014-01-22 DIAGNOSIS — R51 Headache: Secondary | ICD-10-CM

## 2014-01-22 DIAGNOSIS — F418 Other specified anxiety disorders: Secondary | ICD-10-CM

## 2014-01-22 DIAGNOSIS — Z1231 Encounter for screening mammogram for malignant neoplasm of breast: Secondary | ICD-10-CM

## 2014-01-22 DIAGNOSIS — Z0000 Encounter for general adult medical examination without abnormal findings: Secondary | ICD-10-CM

## 2014-01-22 DIAGNOSIS — R5383 Other fatigue: Secondary | ICD-10-CM

## 2014-01-22 DIAGNOSIS — F341 Dysthymic disorder: Secondary | ICD-10-CM

## 2014-01-22 DIAGNOSIS — I10 Essential (primary) hypertension: Secondary | ICD-10-CM

## 2014-01-22 DIAGNOSIS — R7309 Other abnormal glucose: Secondary | ICD-10-CM

## 2014-01-22 LAB — POCT URINALYSIS DIPSTICK
Bilirubin, UA: NEGATIVE
Blood, UA: NEGATIVE
Glucose, UA: NEGATIVE
KETONES UA: NEGATIVE
Nitrite, UA: NEGATIVE
PH UA: 5
PROTEIN UA: NEGATIVE
Spec Grav, UA: 1.025
UROBILINOGEN UA: 0.2

## 2014-01-22 LAB — CBC WITH DIFFERENTIAL/PLATELET
BASOS PCT: 1 % (ref 0–1)
Basophils Absolute: 0.1 10*3/uL (ref 0.0–0.1)
Eosinophils Absolute: 0.2 10*3/uL (ref 0.0–0.7)
Eosinophils Relative: 3 % (ref 0–5)
HEMATOCRIT: 32.6 % — AB (ref 36.0–46.0)
HEMOGLOBIN: 10.1 g/dL — AB (ref 12.0–15.0)
LYMPHS PCT: 32 % (ref 12–46)
Lymphs Abs: 1.7 10*3/uL (ref 0.7–4.0)
MCH: 22.3 pg — ABNORMAL LOW (ref 26.0–34.0)
MCHC: 31 g/dL (ref 30.0–36.0)
MCV: 72.1 fL — AB (ref 78.0–100.0)
MONO ABS: 0.5 10*3/uL (ref 0.1–1.0)
MONOS PCT: 9 % (ref 3–12)
NEUTROS ABS: 2.9 10*3/uL (ref 1.7–7.7)
Neutrophils Relative %: 55 % (ref 43–77)
Platelets: 350 10*3/uL (ref 150–400)
RBC: 4.52 MIL/uL (ref 3.87–5.11)
RDW: 18 % — ABNORMAL HIGH (ref 11.5–15.5)
WBC: 5.2 10*3/uL (ref 4.0–10.5)

## 2014-01-22 LAB — BASIC METABOLIC PANEL WITH GFR
BUN: 24 mg/dL — AB (ref 6–23)
CHLORIDE: 106 meq/L (ref 96–112)
CO2: 26 mEq/L (ref 19–32)
Calcium: 9.1 mg/dL (ref 8.4–10.5)
Creat: 1.33 mg/dL — ABNORMAL HIGH (ref 0.50–1.10)
GFR, Est African American: 49 mL/min — ABNORMAL LOW
GFR, Est Non African American: 42 mL/min — ABNORMAL LOW
GLUCOSE: 100 mg/dL — AB (ref 70–99)
POTASSIUM: 4.2 meq/L (ref 3.5–5.3)
Sodium: 141 mEq/L (ref 135–145)

## 2014-01-22 LAB — THYROID PANEL WITH TSH
Free Thyroxine Index: 2.9 (ref 1.0–3.9)
T3 Uptake: 37.8 % — ABNORMAL HIGH (ref 22.5–37.0)
T4, Total: 7.7 ug/dL (ref 5.0–12.5)
TSH: 3.343 u[IU]/mL (ref 0.350–4.500)

## 2014-01-22 LAB — POCT GLYCOSYLATED HEMOGLOBIN (HGB A1C): HEMOGLOBIN A1C: 6

## 2014-01-22 LAB — SEDIMENTATION RATE: Sed Rate: 16 mm/hr (ref 0–22)

## 2014-01-22 MED ORDER — CITALOPRAM HYDROBROMIDE 40 MG PO TABS: 40.0000 mg | ORAL_TABLET | Freq: Every day | ORAL | Status: DC
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
## 2014-01-22 NOTE — Patient Instructions (Signed)

## 2014-01-23 LAB — VITAMIN D 25 HYDROXY (VIT D DEFICIENCY, FRACTURES): Vit D, 25-Hydroxy: 48 ng/mL (ref 30–89)

## 2014-01-24 NOTE — Progress Notes (Signed)
Quick Note:  Please add following labs: serum iron, TIBC, Ferritin and retic count. If there is a panel for "anemia" or "iron studies" , then just add that panel.  Thanks Angie!!!  ______

## 2014-02-13 ENCOUNTER — Ambulatory Visit: Payer: No Typology Code available for payment source | Admitting: Family Medicine

## 2014-05-01 ENCOUNTER — Ambulatory Visit: Payer: No Typology Code available for payment source | Admitting: Family Medicine

## 2014-06-06 ENCOUNTER — Other Ambulatory Visit: Payer: Self-pay | Admitting: Family Medicine

## 2014-09-20 ENCOUNTER — Other Ambulatory Visit: Payer: Self-pay | Admitting: Physician Assistant

## 2014-09-25 ENCOUNTER — Other Ambulatory Visit: Payer: Self-pay | Admitting: Physician Assistant

## 2014-10-28 ENCOUNTER — Other Ambulatory Visit: Payer: Self-pay | Admitting: Physician Assistant

## 2014-11-06 ENCOUNTER — Other Ambulatory Visit: Payer: Self-pay | Admitting: Physician Assistant

## 2014-11-26 ENCOUNTER — Telehealth: Payer: Self-pay | Admitting: Cardiology

## 2014-11-26 NOTE — Telephone Encounter (Signed)
11/26/2014 Received faxed records from [facility] on patient for upcoming appointment with Dr. Virginia Rochester on 12/01/2014 @ 8:30 am  Records given to [facility].  cbr

## 2014-12-01 ENCOUNTER — Ambulatory Visit (INDEPENDENT_AMBULATORY_CARE_PROVIDER_SITE_OTHER): Payer: Medicare HMO | Admitting: Cardiology

## 2014-12-01 ENCOUNTER — Encounter: Payer: Self-pay | Admitting: Cardiology

## 2014-12-01 VITALS — BP 138/78 | HR 77 | Ht 62.0 in | Wt 183.5 lb

## 2014-12-01 DIAGNOSIS — R931 Abnormal findings on diagnostic imaging of heart and coronary circulation: Secondary | ICD-10-CM | POA: Insufficient documentation

## 2014-12-01 DIAGNOSIS — I2510 Atherosclerotic heart disease of native coronary artery without angina pectoris: Secondary | ICD-10-CM | POA: Diagnosis not present

## 2014-12-01 MED ORDER — PRAVASTATIN SODIUM 40 MG PO TABS: 40.0000 mg | ORAL_TABLET | Freq: Every evening | ORAL | Status: DC

## 2014-12-01 NOTE — Progress Notes (Signed)
Cardiology Office Note   Date:  12/01/2014   ID:  Tiffany Murphy, DOB Jun 15, 1951, MRN [id number]  PCP:  No primary care provider on file.  Cardiologist:   Minus Breeding, MD   Chief Complaint  Patient presents with  . Coronary Calcium      History of Present Illness: Tiffany Murphy is a 64 y.o. female who presents for evaluation of coronary calcium. She has no history of coronary disease. However, recently while she was having a CT to evaluate possible lung mass she was found to have coronary calcium.  I do not have the images but this is described as left main and two-vessel coronary calcium. Of course this was not quantified. The patient denies any cardiovascular symptoms per se. She stays active at work. She doesn't exercise routinely. She gets fatigued and has to stop what she is doing. She describes her heart beating fast and diaphoresis but this has been long-standing. She denies any chest pressure, neck or arm discomfort. She does not have resting symptoms. She does not have overt shortness of breath, PND or orthopnea. She stops what she is doing and she recovers quickly. None of her symptoms are new.  Past Medical History  Diagnosis Date  . Hypertension   . Asthma   . COPD (chronic obstructive pulmonary disease)   . GERD (gastroesophageal reflux disease)   . Tobacco abuse     Past Surgical History  Procedure Laterality Date  . Appendectomy    . Vaginal hysterectomy       Current Outpatient Prescriptions  Medication Sig Dispense Refill  . albuterol (PROVENTIL HFA;VENTOLIN HFA) 108 (90 BASE) MCG/ACT inhaler Inhale 2 puffs into the lungs every 6 (six) hours as needed for wheezing. 1 Inhaler 2  . ALPRAZolam (XANAX) 0.25 MG tablet Take 1-2 tablets by mouth daily.    Jearl Klinefelter ELLIPTA 62.5-25 MCG/INH AEPB Take 1 puff by mouth daily.    . calcium carbonate (OS-CAL) 600 MG TABS tablet Take 600 mg by mouth 2 (two) times daily with a meal.    . cetirizine (ZYRTEC) 10 MG tablet  Take 10 mg by mouth daily.    . Fish Oil-Cholecalciferol (OMEGA-3 FISH OIL/VITAMIN D3 PO) Take 2,000 mg by mouth daily.     . fluorometholone (FML) 0.1 % ophthalmic suspension Place 1 drop into both eyes daily.    Marland Kitchen gabapentin (NEURONTIN) 300 MG capsule Take 2 capsules by mouth daily.    Marland Kitchen lisinopril-hydrochlorothiazide (PRINZIDE,ZESTORETIC) 20-25 MG per tablet TAKE ONE TABLET BY MOUTH ONCE DAILY "OV NEEDED FOR ADDITIONAL VISITS" 30 tablet 0  . mupirocin ointment (BACTROBAN) 2 % Apply 1 application topically daily.    Marland Kitchen omeprazole (PRILOSEC) 40 MG capsule Take 1 capsule by mouth daily.    . potassium gluconate 595 MG TABS tablet Take 595 mg by mouth.    . RESTASIS 0.05 % ophthalmic emulsion Place 1 drop into both eyes as needed.    . sertraline (ZOLOFT) 50 MG tablet Take 50 mg by mouth daily.    . SYMBICORT 160-4.5 MCG/ACT inhaler Take 2 puffs by mouth daily.    . pravastatin (PRAVACHOL) 40 MG tablet Take 1 tablet (40 mg total) by mouth every evening. 30 tablet 6   No current facility-administered medications for this visit.    Allergies:   Lovastatin; Other; and Penicillins    Social History:  The patient  reports that she has been smoking Cigarettes.  She has a 88 pack-year smoking history. She does not have  any smokeless tobacco history on file. She reports that she does not drink alcohol or use illicit drugs.   Family History:  The patient's family history includes Alzheimer's disease (age of onset: 87) in her father; CAD (age of onset: 62) in her mother; Diabetes in her father.    ROS:  Please see the history of present illness.   Otherwise, review of systems are positive for joint pains, body aches, cough, headaches, dizziness..   All other systems are reviewed and negative.    PHYSICAL EXAM: VS:  BP 138/78 mmHg  Pulse 77  Ht 5\' 2"  (1.575 m)  Wt 183 lb 8 oz (83.235 kg)  BMI 33.55 kg/m2 , BMI Body mass index is 33.55 kg/(m^2). GENERAL:  Well appearing HEENT:  Pupils equal round  and reactive, fundi not visualized, oral mucosa unremarkable, dentures.   NECK:  No jugular venous distention, waveform within normal limits, carotid upstroke brisk and symmetric, no bruits, no thyromegaly LYMPHATICS:  No cervical, inguinal adenopathy LUNGS:  Clear to auscultation bilaterally BACK:  No CVA tenderness CHEST:  Unremarkable HEART:  PMI not displaced or sustained,S1 and S2 within normal limits, no S3, no S4, no clicks, no rubs, no murmurs ABD:  Flat, positive bowel sounds normal in frequency in pitch, no bruits, no rebound, no guarding, no midline pulsatile mass, no hepatomegaly, no splenomegaly EXT:  2 plus pulses throughout, no edema, no cyanosis no clubbing SKIN:  No rashes no nodules NEURO:  Cranial nerves II through XII grossly intact, motor grossly intact throughout PSYCH:  Cognitively intact, oriented to person place and time    EKG:  EKG is ordered today. The ekg ordered today demonstrates sinus rhythm, rate 77, axis within normal limits, poor anterior R wave progression, no acute ST-T wave changes.   Recent Labs: 01/22/2014: BUN 24*; Creat 1.33*; Hemoglobin 10.1*; Platelets 350; Potassium 4.2; Sodium 141; TSH 3.343    Lipid Panel No results found for: CHOL, TRIG, HDL, CHOLHDL, VLDL, LDLCALC, LDLDIRECT    Wt Readings from Last 3 Encounters:  12/01/14 183 lb 8 oz (83.235 kg)  01/22/14 176 lb (79.833 kg)  09/10/13 189 lb (85.73 kg)      Other studies Reviewed: Additional studies/ records that were reviewed today include: Office records and lipids. Review of the above records demonstrates:  Please see elsewhere in the note.     ASSESSMENT AND PLAN:  CAD:  The patient has known coronary calcium. She has no symptoms however. I'm going to screen her with a POET (Plain Old Exercise Treadmill).  We talked a great deal about primary risk reduction.  I think it would be prudent to screen her yearly with stress testing. Should she have any symptoms in the future I  will have a low threshold for further evaluation.  TOBACCO ABUSE:  She has tried multiple things including Chantix and patches. I suggested lozenges. I gave her telephone numbers.  We discussed a specific strategy for tobacco cessation.  (Greater than three minutes discussing tobacco cessation.)  DYSLIPIDEMIA:  Given her risk factors I think her LDL should be at least less than 100 and it was recently 140. She had problems with Mevacor in the past.  I will start Pravachol 40 mg daily.  . Current medicines are reviewed at length with the patient today.  The patient does not have concerns regarding medicines.  The following changes have been made:  Danelle Earthly  Labs/ tests ordered today include:   Orders Placed This Encounter  Procedures  .  Exercise Tolerance Test  . EKG 12-Lead   Disposition:   FU with me in one year.    Signed, Minus Breeding, MD  12/01/2014 9:53 AM    [place]

## 2014-12-01 NOTE — Patient Instructions (Addendum)
Your physician recommends that you schedule a follow-up appointment in: [place] has requested that you have an exercise tolerance test. For further information please visit [email or web address]. Please also follow instruction sheet, as given.  Your physician has recommended you make the following change in your medication: Start Pravastatin 40 mg daily'

## 2014-12-31 ENCOUNTER — Telehealth (HOSPITAL_COMMUNITY): Payer: Self-pay

## 2014-12-31 NOTE — Telephone Encounter (Signed)
Encounter complete. 

## 2015-01-01 ENCOUNTER — Telehealth (HOSPITAL_COMMUNITY): Payer: Self-pay

## 2015-01-01 NOTE — Telephone Encounter (Signed)
Encounter complete. 

## 2015-01-02 ENCOUNTER — Ambulatory Visit (HOSPITAL_COMMUNITY)
Admission: RE | Admit: 2015-01-02 | Discharge: 2015-01-02 | Disposition: A | Discharge status: Home / Self Care | Payer: Medicare HMO | Source: Ambulatory Visit | Attending: Cardiology | Admitting: Cardiology

## 2015-01-02 DIAGNOSIS — I2510 Atherosclerotic heart disease of native coronary artery without angina pectoris: Secondary | ICD-10-CM

## 2015-01-02 DIAGNOSIS — R9439 Abnormal result of other cardiovascular function study: Secondary | ICD-10-CM | POA: Diagnosis not present

## 2015-01-02 DIAGNOSIS — R931 Abnormal findings on diagnostic imaging of heart and coronary circulation: Secondary | ICD-10-CM

## 2015-01-02 LAB — EXERCISE TOLERANCE TEST
CHL RATE OF PERCEIVED EXERTION: 15
Estimated workload: 6.7 METS
Exercise duration (min): 4 min
Exercise duration (sec): 49 s
MPHR: 155 {beats}/min
Peak HR: 123 {beats}/min
Percent HR: 79 %
Rest HR: 69 {beats}/min

## 2015-01-05 ENCOUNTER — Other Ambulatory Visit: Payer: Self-pay | Admitting: *Deleted

## 2015-01-05 DIAGNOSIS — R931 Abnormal findings on diagnostic imaging of heart and coronary circulation: Secondary | ICD-10-CM

## 2015-01-14 ENCOUNTER — Telehealth: Payer: Self-pay | Admitting: Cardiology

## 2015-01-14 ENCOUNTER — Other Ambulatory Visit: Payer: Self-pay | Admitting: *Deleted

## 2015-01-14 DIAGNOSIS — R931 Abnormal findings on diagnostic imaging of heart and coronary circulation: Secondary | ICD-10-CM

## 2015-01-14 NOTE — Telephone Encounter (Signed)
Follow up:   Per pt she was told she would get a call back to re-do her EXTOL. TEST.   Please give her a call.

## 2015-01-14 NOTE — Telephone Encounter (Signed)
Release myoview  Per scheduler - unable to see order. Called left message on patient's voicemail Left message scheduler will contact patient with results.

## 2015-01-14 NOTE — Telephone Encounter (Signed)
Called and left message for patient to call and schedule nuclear stress test ordered by Dr. Percival Spanish

## 2015-01-16 ENCOUNTER — Other Ambulatory Visit: Payer: Self-pay | Admitting: *Deleted

## 2015-01-16 DIAGNOSIS — R931 Abnormal findings on diagnostic imaging of heart and coronary circulation: Secondary | ICD-10-CM

## 2015-01-20 ENCOUNTER — Telehealth (HOSPITAL_COMMUNITY): Payer: Self-pay

## 2015-01-20 NOTE — Telephone Encounter (Signed)
Encounter complete. 

## 2015-01-21 ENCOUNTER — Telehealth (HOSPITAL_COMMUNITY): Payer: Self-pay

## 2015-01-21 NOTE — Telephone Encounter (Signed)
Encounter complete. 

## 2015-01-22 ENCOUNTER — Ambulatory Visit (HOSPITAL_COMMUNITY)
Admission: RE | Admit: 2015-01-22 | Discharge: 2015-01-22 | Disposition: A | Discharge status: Home / Self Care | Payer: Medicare HMO | Source: Ambulatory Visit | Attending: Cardiovascular Disease | Admitting: Cardiovascular Disease

## 2015-01-22 DIAGNOSIS — I2510 Atherosclerotic heart disease of native coronary artery without angina pectoris: Secondary | ICD-10-CM | POA: Insufficient documentation

## 2015-01-22 DIAGNOSIS — R931 Abnormal findings on diagnostic imaging of heart and coronary circulation: Secondary | ICD-10-CM

## 2015-01-22 LAB — MYOCARDIAL PERFUSION IMAGING
CHL CUP RESTING HR STRESS: 66 {beats}/min
LV dias vol: 83 mL
LV sys vol: 32 mL
Peak HR: 82 {beats}/min
SDS: 1
SRS: 2
SSS: 3
TID: 1.27

## 2015-01-22 MED ORDER — REGADENOSON 0.4 MG/5ML IV SOLN
0.4000 mg | Freq: Once | INTRAVENOUS | Status: AC
  Administered 2015-01-22: 0.4 mg via INTRAVENOUS

## 2015-01-22 MED ORDER — TECHNETIUM TC 99M SESTAMIBI GENERIC - CARDIOLITE
32.8000 | Freq: Once | INTRAVENOUS | Status: AC | PRN
  Administered 2015-01-22: 32.8 via INTRAVENOUS

## 2015-01-22 MED ORDER — TECHNETIUM TC 99M SESTAMIBI GENERIC - CARDIOLITE
10.2000 | Freq: Once | INTRAVENOUS | Status: AC | PRN
  Administered 2015-01-22: 10 via INTRAVENOUS

## 2015-07-21 ENCOUNTER — Other Ambulatory Visit: Payer: Self-pay | Admitting: Cardiology

## 2015-08-19 ENCOUNTER — Other Ambulatory Visit: Payer: Self-pay | Admitting: Cardiology

## 2015-08-20 NOTE — Telephone Encounter (Signed)
REFILL 

## 2015-08-25 LAB — HM COLONOSCOPY

## 2015-08-26 HISTORY — PX: COLONOSCOPY: SHX174

## 2016-02-10 ENCOUNTER — Other Ambulatory Visit: Payer: Self-pay | Admitting: Oncology

## 2016-03-07 ENCOUNTER — Other Ambulatory Visit: Payer: Self-pay | Admitting: *Deleted

## 2016-03-07 MED ORDER — PRAVASTATIN SODIUM 40 MG PO TABS: 40.0000 mg | ORAL_TABLET | Freq: Every evening | ORAL | 1 refills | Status: DC

## 2016-03-07 NOTE — Telephone Encounter (Signed)
called to change pharmacy to [facility], did inform pt taht she needs an OV, she is over due, transffered to scheduling to get that appt.

## 2016-04-07 ENCOUNTER — Encounter: Payer: Self-pay | Admitting: Cardiology

## 2016-04-20 NOTE — Progress Notes (Deleted)
Cardiology Office Note   Date:  04/20/2016   ID:  Tiffany Murphy, DOB [birth year], MRN [id number]  PCP:  Imagene Riches, NP  Cardiologist:   Minus Breeding, MD   No chief complaint on file.     History of Present Illness: Tiffany Murphy is a 65 y.o. female who presents for evaluation of coronary calcium. She has had a CT to evaluate possible lung mass and she was found to have coronary calcium.  I do not have the images but this is described as left main and two-vessel coronary calcium. Of course this was not quantified.  She had an inadequate POET (Plain Old Exercise Treadmill).   Stress test demonstrated no evidence of ischemia or infarct and EF is normal. She was instructed to follow up with me yearly. ***   She should have follow up with me yearly or if she has any chest pain symptoms. However, she does not have any evidence of obstructive disease from this test. Call [name] with the results and send results to Imagene Riches, NP  Given the patient's known coronary calcium and this test as being negative but inadequate she will need to come back for pharmacologic stress testing. Please call the patient and schedule Lexiscan Myoview.  The patient denies any cardiovascular symptoms per se. She stays active at work. She doesn't exercise routinely. She gets fatigued and has to stop what she is doing. She describes her heart beating fast and diaphoresis but this has been long-standing. She denies any chest pressure, neck or arm discomfort. She does not have resting symptoms. She does not have overt shortness of breath, PND or orthopnea. She stops what she is doing and she recovers quickly. None of her symptoms are new.  Past Medical History:  Diagnosis Date  . Asthma   . COPD (chronic obstructive pulmonary disease) ([place])   . GERD (gastroesophageal reflux disease)   . Hypertension   . Tobacco abuse     Past Surgical History:  Procedure Laterality Date  . APPENDECTOMY    . VAGINAL  HYSTERECTOMY       Current Outpatient Prescriptions  Medication Sig Dispense Refill  . albuterol (PROVENTIL HFA;VENTOLIN HFA) 108 (90 BASE) MCG/ACT inhaler Inhale 2 puffs into the lungs every 6 (six) hours as needed for wheezing. 1 Inhaler 2  . ALPRAZolam (XANAX) 0.25 MG tablet Take 1-2 tablets by mouth daily.    Jearl Klinefelter ELLIPTA 62.5-25 MCG/INH AEPB Take 1 puff by mouth daily.    . calcium carbonate (OS-CAL) 600 MG TABS tablet Take 600 mg by mouth 2 (two) times daily with a meal.    . cetirizine (ZYRTEC) 10 MG tablet Take 10 mg by mouth daily.    . Fish Oil-Cholecalciferol (OMEGA-3 FISH OIL/VITAMIN D3 PO) Take 2,000 mg by mouth daily.     . fluorometholone (FML) 0.1 % ophthalmic suspension Place 1 drop into both eyes daily.    Marland Kitchen gabapentin (NEURONTIN) 300 MG capsule Take 2 capsules by mouth daily.    Marland Kitchen lisinopril-hydrochlorothiazide (PRINZIDE,ZESTORETIC) 20-25 MG per tablet TAKE ONE TABLET BY MOUTH ONCE DAILY "OV NEEDED FOR ADDITIONAL VISITS" 30 tablet 0  . mupirocin ointment (BACTROBAN) 2 % Apply 1 application topically daily.    Marland Kitchen omeprazole (PRILOSEC) 40 MG capsule Take 1 capsule by mouth daily.    . potassium gluconate 595 MG TABS tablet Take 595 mg by mouth.    . pravastatin (PRAVACHOL) 40 MG tablet Take 1 tablet (40 mg total) by mouth every evening.  30 tablet 1  . RESTASIS 0.05 % ophthalmic emulsion Place 1 drop into both eyes as needed.    . sertraline (ZOLOFT) 50 MG tablet Take 50 mg by mouth daily.    . SYMBICORT 160-4.5 MCG/ACT inhaler Take 2 puffs by mouth daily.     No current facility-administered medications for this visit.     Allergies:   Lovastatin; Other; and Penicillins    ROS:  Please see the history of present illness.   Otherwise, review of systems are positive for joint pains, body aches, cough, headaches, dizziness ***..   All other systems are reviewed and negative.    PHYSICAL EXAM: VS:  There were no vitals taken for this visit. , BMI There is no height or  weight on file to calculate BMI. GENERAL:  Well appearing HEENT:  Pupils equal round and reactive, fundi not visualized, oral mucosa unremarkable, dentures.   NECK:  No jugular venous distention, waveform within normal limits, carotid upstroke brisk and symmetric, no bruits, no thyromegaly LYMPHATICS:  No cervical, inguinal adenopathy LUNGS:  Clear to auscultation bilaterally BACK:  No CVA tenderness CHEST:  Unremarkable HEART:  PMI not displaced or sustained,S1 and S2 within normal limits, no S3, no S4, no clicks, no rubs, no murmurs ABD:  Flat, positive bowel sounds normal in frequency in pitch, no bruits, no rebound, no guarding, no midline pulsatile mass, no hepatomegaly, no splenomegaly EXT:  2 plus pulses throughout, no edema, no cyanosis no clubbing   EKG:  EKG is ordered today. The ekg ordered today demonstrates sinus rhythm, rate 77, axis within normal limits, poor anterior R wave progression, no acute ST-T wave changes.   Recent Labs: No results found for requested labs within last 8760 hours.    Lipid Panel No results found for: CHOL, TRIG, HDL, CHOLHDL, VLDL, LDLCALC, LDLDIRECT    Wt Readings from Last 3 Encounters:  01/22/15 183 lb (83 kg)  12/01/14 183 lb 8 oz (83.2 kg)  01/22/14 176 lb (79.8 kg)      Other studies Reviewed: Additional studies/ records that were reviewed today include: *** Review of the above records demonstrates:  ***    ASSESSMENT AND PLAN:  CAD:  The patient has known coronary calcium.   ***  TOBACCO ABUSE:  She has tried multiple things including Chantix and patches. I suggested lozenges. I gave her telephone numbers.  We discussed a specific strategy for tobacco cessation.  (Greater than three minutes discussing tobacco cessation.)  DYSLIPIDEMIA:  ***.  Current medicines are reviewed at length with the patient today.  The patient does not have concerns regarding medicines.  The following changes have been made:  ***  Labs/ tests  ordered today include:  ***  No orders of the defined types were placed in this encounter.  Disposition:   FU with me ***  Signed, Minus Breeding, MD  04/20/2016 9:14 PM    [place]

## 2016-04-21 ENCOUNTER — Ambulatory Visit: Payer: Medicare HMO | Admitting: Cardiology

## 2016-06-11 ENCOUNTER — Other Ambulatory Visit: Payer: Self-pay | Admitting: Cardiology

## 2016-06-14 NOTE — Telephone Encounter (Signed)
REFILL 

## 2017-08-15 ENCOUNTER — Ambulatory Visit: Payer: Medicare HMO | Admitting: Neurology

## 2017-09-14 ENCOUNTER — Ambulatory Visit: Payer: Medicare HMO | Admitting: Neurology

## 2017-10-02 ENCOUNTER — Telehealth: Payer: Self-pay | Admitting: Gastroenterology

## 2017-10-03 NOTE — Telephone Encounter (Signed)
Pl refill dicyclomine 10mg  po qid  #120 with 6 refills

## 2017-10-03 NOTE — Telephone Encounter (Signed)
Would you like to give her refills? If so directions and how many refills?

## 2017-10-04 MED ORDER — DICYCLOMINE HCL 10 MG PO CAPS: 10.0000 mg | ORAL_CAPSULE | Freq: Four times a day (QID) | ORAL | 6 refills | Status: DC

## 2017-10-04 NOTE — Telephone Encounter (Signed)
Sent refill into patients pharmacy.  

## 2018-01-03 ENCOUNTER — Emergency Department (HOSPITAL_COMMUNITY)
Admission: EM | Admit: 2018-01-03 | Discharge: 2018-01-03 | Disposition: A | Discharge status: Home / Self Care | Payer: Medicare HMO | Attending: Emergency Medicine | Admitting: Emergency Medicine

## 2018-01-03 ENCOUNTER — Other Ambulatory Visit: Payer: Self-pay

## 2018-01-03 ENCOUNTER — Emergency Department (HOSPITAL_COMMUNITY): Payer: Medicare HMO

## 2018-01-03 ENCOUNTER — Encounter (HOSPITAL_COMMUNITY): Payer: Self-pay | Admitting: Emergency Medicine

## 2018-01-03 DIAGNOSIS — J441 Chronic obstructive pulmonary disease with (acute) exacerbation: Secondary | ICD-10-CM | POA: Diagnosis not present

## 2018-01-03 DIAGNOSIS — R51 Headache: Secondary | ICD-10-CM | POA: Insufficient documentation

## 2018-01-03 DIAGNOSIS — F17210 Nicotine dependence, cigarettes, uncomplicated: Secondary | ICD-10-CM | POA: Insufficient documentation

## 2018-01-03 DIAGNOSIS — R519 Headache, unspecified: Secondary | ICD-10-CM

## 2018-01-03 DIAGNOSIS — I10 Essential (primary) hypertension: Secondary | ICD-10-CM | POA: Insufficient documentation

## 2018-01-03 DIAGNOSIS — Z79899 Other long term (current) drug therapy: Secondary | ICD-10-CM | POA: Insufficient documentation

## 2018-01-03 DIAGNOSIS — R05 Cough: Secondary | ICD-10-CM | POA: Diagnosis present

## 2018-01-03 LAB — CBC
HEMATOCRIT: 43.2 % (ref 36.0–46.0)
Hemoglobin: 13.9 g/dL (ref 12.0–15.0)
MCH: 30.9 pg (ref 26.0–34.0)
MCHC: 32.2 g/dL (ref 30.0–36.0)
MCV: 96 fL (ref 78.0–100.0)
Platelets: 209 10*3/uL (ref 150–400)
RBC: 4.5 MIL/uL (ref 3.87–5.11)
RDW: 13.5 % (ref 11.5–15.5)
WBC: 7.5 10*3/uL (ref 4.0–10.5)

## 2018-01-03 LAB — BASIC METABOLIC PANEL
Anion gap: 10 (ref 5–15)
BUN: 13 mg/dL (ref 8–23)
CHLORIDE: 107 mmol/L (ref 98–111)
CO2: 26 mmol/L (ref 22–32)
Calcium: 9.4 mg/dL (ref 8.9–10.3)
Creatinine, Ser: 1.01 mg/dL — ABNORMAL HIGH (ref 0.44–1.00)
GFR calc Af Amer: >60 mL/min (ref >60)
GFR calc non Af Amer: 56 mL/min — ABNORMAL LOW (ref >60)
GLUCOSE: 152 mg/dL — AB (ref 70–99)
POTASSIUM: 4.3 mmol/L (ref 3.5–5.1)
SODIUM: 143 mmol/L (ref 135–145)

## 2018-01-03 MED ORDER — DOXYCYCLINE HYCLATE 100 MG PO TABS
100.0000 mg | ORAL_TABLET | Freq: Once | ORAL | Status: AC
  Administered 2018-01-03: 100 mg via ORAL
  Filled 2018-01-03: qty 1

## 2018-01-03 MED ORDER — SODIUM CHLORIDE 0.9 % IV BOLUS
500.0000 mL | Freq: Once | INTRAVENOUS | Status: AC
  Administered 2018-01-03: 500 mL via INTRAVENOUS

## 2018-01-03 MED ORDER — FENTANYL CITRATE (PF) 100 MCG/2ML IJ SOLN
25.0000 ug | Freq: Once | INTRAMUSCULAR | Status: DC
  Filled 2018-01-03: qty 2

## 2018-01-03 MED ORDER — FENTANYL CITRATE (PF) 100 MCG/2ML IJ SOLN
12.5000 ug | Freq: Once | INTRAMUSCULAR | Status: AC
  Administered 2018-01-03: 12.5 ug via INTRAVENOUS

## 2018-01-03 MED ORDER — DOXYCYCLINE HYCLATE 100 MG PO CAPS: 100.0000 mg | ORAL_CAPSULE | Freq: Two times a day (BID) | ORAL | 0 refills | Status: DC

## 2018-01-03 MED ORDER — PREDNISONE 10 MG PO TABS: 20.0000 mg | ORAL_TABLET | Freq: Every day | ORAL | 0 refills | Status: AC

## 2018-01-03 MED ORDER — METHYLPREDNISOLONE SODIUM SUCC 125 MG IJ SOLR
125.0000 mg | Freq: Once | INTRAMUSCULAR | Status: AC
  Administered 2018-01-03: 125 mg via INTRAVENOUS
  Filled 2018-01-03: qty 2

## 2018-01-03 MED ORDER — IPRATROPIUM-ALBUTEROL 0.5-2.5 (3) MG/3ML IN SOLN
3.0000 mL | Freq: Once | RESPIRATORY_TRACT | Status: AC
  Administered 2018-01-03: 3 mL via RESPIRATORY_TRACT
  Filled 2018-01-03: qty 3

## 2018-01-03 NOTE — Discharge Instructions (Addendum)
Please make sure you are using your nebulizer!

## 2018-01-03 NOTE — ED Triage Notes (Addendum)
Pt presents to ED with a hx of COPD, states increasing cough with green sputum, sore throat, and right sided headache x 1 week.  No wheezing noted in triage.  Pt states up to date on COPD inhalers/meds.  Pt states severe decrease in energy.

## 2018-01-03 NOTE — ED Provider Notes (Signed)
16:15: Assumed care from Wyn Quaker PA-C at change of shift pending CT head and ambulatory SpO2  See prior provider note for full H&P. Briefly patient is a 67 year old female with a past medical history of COPD and tobacco abuse who presents to the emergency department with URI symptoms, increased shortness of breath, and right-sided headache.  Patient had not been using her nebulizer treatments at home as prescribed.  Regarding her URI symptoms and dyspnea upon arrival to the emergency department she was given a DuoNeb with significant improvement.  Regarding her headache, gradual onset, steady progression, similar to previous, has been occurring intermittently for several months. Located in R frontal region radiating to entire R head. Prior PA ordered head CT to further evaluate with low suspicion.   Physical Exam  BP 122/62 (BP Location: Left Arm)   Pulse 65   Temp 98.5 F (36.9 C) (Oral)   Resp 19   SpO2 92%   Physical Exam  Constitutional: She appears well-developed and well-nourished. No distress.  HENT:  Head: Normocephalic and atraumatic.  Eyes: Conjunctivae are normal. Right eye exhibits no discharge. Left eye exhibits no discharge.  Cardiovascular: Normal rate and regular rhythm.  Pulmonary/Chest: Effort normal and breath sounds normal. No respiratory distress. She has no wheezes.  Neurological: She is alert.  Clear speech.  CN III through XII grossly intact.  Negative pronator drift.  Symmetric grip strength.  Sensation grossly intact bilateral upper extremities.  Ambulatory with steady gait.  Psychiatric: She has a normal mood and affect. Her behavior is normal. Thought content normal.  Nursing note and vitals reviewed.   ED Course/Procedures   Results for orders placed or performed during the hospital encounter of 60/10/93  Basic metabolic panel  Result Value Ref Range   Sodium 143 135 - 145 mmol/L   Potassium 4.3 3.5 - 5.1 mmol/L   Chloride 107 98 - 111 mmol/L    CO2 26 22 - 32 mmol/L   Glucose, Bld 152 (H) 70 - 99 mg/dL   BUN 13 8 - 23 mg/dL   Creatinine, Ser 1.01 (H) 0.44 - 1.00 mg/dL   Calcium 9.4 8.9 - 10.3 mg/dL   GFR calc non Af Amer 56 (L) >60 mL/min   GFR calc Af Amer >60 >60 mL/min   Anion gap 10 5 - 15  CBC  Result Value Ref Range   WBC 7.5 4.0 - 10.5 K/uL   RBC 4.50 3.87 - 5.11 MIL/uL   Hemoglobin 13.9 12.0 - 15.0 g/dL   HCT 43.2 36.0 - 46.0 %   MCV 96.0 78.0 - 100.0 fL   MCH 30.9 26.0 - 34.0 pg   MCHC 32.2 30.0 - 36.0 g/dL   RDW 13.5 11.5 - 15.5 %   Platelets 209 150 - 400 K/uL   Dg Chest 2 View  Result Date: 01/03/2018 CLINICAL DATA:  Cough, congestion, increased cough with green sputum, sore throat and RIGHT-side headache, history hypertension, smoking, COPD, asthma EXAM: CHEST - 2 VIEW COMPARISON:  06/28/2017 FINDINGS: Normal heart size, mediastinal contours, and pulmonary vascularity. Atherosclerotic calcification aorta. Minimal chronic bronchitic changes. No pulmonary infiltrate, pleural effusion or pneumothorax. Bones demineralized. IMPRESSION: Minimal chronic bronchitic changes without infiltrate. Electronically Signed   By: Lavonia Dana M.D.   On: 01/03/2018 12:23   Ct Head Wo Contrast  Result Date: 01/03/2018 CLINICAL DATA:  Right-sided headache, acute, normal neuro exam. Symptoms for 1 week. EXAM: CT HEAD WITHOUT CONTRAST TECHNIQUE: Contiguous axial images were obtained from the base of  the skull through the vertex without intravenous contrast. COMPARISON:  MRI brain 06/21/2017. FINDINGS: Brain: Cystic lesion and gliosis involving the left temporal tip is stable. No acute infarct, hemorrhage, or mass lesion is present. Ventricles are proportionate to mild atrophy. No significant extra-axial fluid collection is present. The brainstem and cerebellum are normal. Vascular: Atherosclerotic calcifications are present within the cavernous internal carotid arteries bilaterally. There is no hyperdense vessel. Skull: Calvarium is intact.  No focal lytic or blastic lesions are present. Sinuses/Orbits: Mucosal thickening is present in the right sphenoid sinus. The paranasal sinuses are otherwise clear. There is some fluid inferiorly in the right mastoid air cells. No obstructing nasopharyngeal lesion is present. The left mastoid air cells are clear. The globes and orbits are within normal limits. IMPRESSION: 1. No acute intracranial abnormality or significant interval change. 2. Right sphenoid sinus disease. Electronically Signed   By: San Morelle M.D.   On: 01/03/2018 16:36   Results reviewed:: Labs grossly unremarkable, chest x-ray negative for infiltrate-findings of minimal chronic bronchitic changes.  17:30: RE-EVAL: Patient ambulatory pulse ox remaining > 92%, no complaints of dyspnea with ambulation. She does report that her headache has persisted. CT head negative for acute intracranial abnormality- history and exam do not appear consistent with SAH, ICH, ischemic CVA, dural venous sinus thrombosis, acute glaucoma, giant cell arteritis, mass, or meningitis. Will trial fluids and low dose of fentanyl per supervising physician Dr. Gloris Manchester recommendations.    19:00: RE-EVAL: Patient feeling much better, ready go to home.   Procedures  MDM    Patient work-up reassuring. No evidence of respiratory distress, ambulatory with SpO2 > 90%. I discussed results, treatment plan, need for PCP follow-up, and return precautions with the patient. Provided opportunity for questions, patient confirmed understanding and is in agreement with plan. Discharge instructions per previous provider.   Vitals:   01/03/18 1420 01/03/18 1506 01/03/18 1727 01/03/18 1902  BP: (!) 104/54  122/62 (!) 144/60  Pulse: 69  65 64  Resp: 19  19 18   Temp: 98.9 F (37.2 C)  98.5 F (36.9 C)   TempSrc: Oral  Oral   SpO2: 94% 98% 92% 95%       Amaryllis Dyke, PA-C 01/04/18 0109    Fredia Sorrow, MD 01/16/18 [phone]

## 2018-01-03 NOTE — ED Notes (Signed)
Patient transported to CT 

## 2018-01-03 NOTE — ED Notes (Addendum)
Pt ambulated in hallway on pulse ox. O2 saturation stayed between 92-94%. Gait was steady. PA notified.

## 2018-01-03 NOTE — ED Provider Notes (Signed)
[facility] EMERGENCY DEPARTMENT Provider Note   CSN: [id number] Arrival date & time: 01/03/18  1116     History   Chief Complaint Chief Complaint  Patient presents with  . Cough  . COPD    HPI Tiffany Murphy is a 67 y.o. female with a past medical history of COPD, tobacco abuse, who presents today for evaluation of increasing cough, increasing sputum purulence for the past 3 days.  Reports generally not feeling well, that she gets very weak very quickly, and becomes very short of breath quickly.  She denies any fevers at home.  She also reports right-sided headache that she has had for multiple months that is been significantly worse for the past day.  She reports that she has not been using her nebulizers and breathing treatments at home like she is supposed to.  She does not have a reason for this.    HPI  Past Medical History:  Diagnosis Date  . Asthma   . COPD (chronic obstructive pulmonary disease) ([place])   . GERD (gastroesophageal reflux disease)   . Hypertension   . Tobacco abuse     Patient Active Problem List   Diagnosis Date Noted  . Elevated coronary artery calcium score 12/01/2014  . Benign essential HTN 07/11/2013  . Depression with anxiety 07/11/2013  . Pure hypercholesterolemia 07/11/2013  . Nicotine addiction 06/15/2013  . Overweight 06/15/2013    Past Surgical History:  Procedure Laterality Date  . APPENDECTOMY    . VAGINAL HYSTERECTOMY       OB History   None      Home Medications    Prior to Admission medications   Medication Sig Start Date End Date Taking? Authorizing Provider  albuterol (PROVENTIL HFA;VENTOLIN HFA) 108 (90 BASE) MCG/ACT inhaler Inhale 2 puffs into the lungs every 6 (six) hours as needed for wheezing. 07/11/13   Barton Fanny, MD  ALPRAZolam Duanne Moron) 0.25 MG tablet Take 1-2 tablets by mouth daily. 11/26/14   [provider]  ANORO ELLIPTA 62.5-25 MCG/INH AEPB Take 1 puff by mouth daily. 09/25/14    [provider]  calcium carbonate (OS-CAL) 600 MG TABS tablet Take 600 mg by mouth 2 (two) times daily with a meal.    [provider]  cetirizine (ZYRTEC) 10 MG tablet Take 10 mg by mouth daily.    [provider]  dicyclomine (BENTYL) 10 MG capsule Take 1 capsule (10 mg total) by mouth 4 (four) times daily. 10/04/17   Jackquline Denmark, MD  doxycycline (VIBRAMYCIN) 100 MG capsule Take 1 capsule (100 mg total) by mouth 2 (two) times daily. 01/03/18   Lorin Glass, PA-C  Fish Oil-Cholecalciferol (OMEGA-3 FISH OIL/VITAMIN D3 PO) Take 2,000 mg by mouth daily.     [provider]  fluorometholone (FML) 0.1 % ophthalmic suspension Place 1 drop into both eyes daily. 09/04/14   [provider]  gabapentin (NEURONTIN) 300 MG capsule Take 2 capsules by mouth daily. 11/12/14   [provider]  lisinopril-hydrochlorothiazide (PRINZIDE,ZESTORETIC) 20-25 MG per tablet TAKE ONE TABLET BY MOUTH ONCE DAILY "OV NEEDED FOR ADDITIONAL VISITS" 09/22/14   Harrison Mons, PA-C  mupirocin ointment (BACTROBAN) 2 % Apply 1 application topically daily. 11/12/14   [provider]  omeprazole (PRILOSEC) 40 MG capsule Take 1 capsule by mouth daily. 11/01/14   [provider]  potassium gluconate 595 MG TABS tablet Take 595 mg by mouth.    [provider]  pravastatin (PRAVACHOL) 40 MG tablet  TAKE 1 TABLET EVERY EVENING 06/14/16   Minus Breeding, MD  predniSONE (DELTASONE) 10 MG tablet Take 2 tablets (20 mg total) by mouth daily for 4 days. 01/03/18 01/07/18  Lorin Glass, PA-C  RESTASIS 0.05 % ophthalmic emulsion Place 1 drop into both eyes as needed. 09/04/14   [provider]  sertraline (ZOLOFT) 50 MG tablet Take 50 mg by mouth daily.    [provider]  SYMBICORT 160-4.5 MCG/ACT inhaler Take 2 puffs by mouth daily. 10/04/14   [provider]    Family History Family History  Problem Relation Age of Onset  .  Alzheimer's disease Father 69  . Diabetes Father   . CAD Mother 30    Social History Social History   Tobacco Use  . Smoking status: Current Every Day Smoker    Packs/day: 0.50    Years: 44.00    Pack years: 22.00    Types: Cigarettes  . Smokeless tobacco: Never Used  Substance Use Topics  . Alcohol use: No    Alcohol/week: 0.0 oz  . Drug use: No     Allergies   Lovastatin; Other; and Penicillins   Review of Systems Review of Systems  Constitutional: Positive for fatigue.  HENT: Positive for congestion, ear pain and sore throat.   Eyes: Negative for visual disturbance.  Respiratory: Positive for cough and shortness of breath. Negative for wheezing.   Cardiovascular: Negative for chest pain and palpitations.  Gastrointestinal: Negative for abdominal pain.  Neurological: Positive for headaches.  All other systems reviewed and are negative.    Physical Exam Updated Vital Signs BP (!) 104/54   Pulse 69   Temp 98.9 F (37.2 C) (Oral)   Resp 19   SpO2 98%   Physical Exam  Constitutional: She is oriented to person, place, and time. She appears well-developed and well-nourished. No distress.  HENT:  Head: Normocephalic and atraumatic.  Right Ear: Tympanic membrane, external ear and ear canal normal.  Left Ear: Tympanic membrane, external ear and ear canal normal.  Nose: Mucosal edema present.  Mouth/Throat: Uvula is midline and mucous membranes are normal. No posterior oropharyngeal edema or posterior oropharyngeal erythema.  White plaques on tongue and in mouth.   Eyes: Conjunctivae are normal.  Neck: Neck supple.  Cardiovascular: Normal rate, regular rhythm, normal heart sounds and intact distal pulses.  No murmur heard. Pulmonary/Chest: Effort normal. No respiratory distress. She has decreased breath sounds in the right upper field, the right middle field, the right lower field, the left upper field, the left middle field and the left lower field. She has  wheezes in the right upper field, the right middle field, the right lower field, the left upper field, the left middle field and the left lower field. She has rhonchi in the right upper field, the right middle field, the right lower field, the left upper field, the left middle field and the left lower field.  Abdominal: Soft. There is no tenderness.  Musculoskeletal: She exhibits no edema (To bilateral lower legs).  Neurological: She is alert and oriented to person, place, and time. No cranial nerve deficit or sensory deficit. Coordination normal.  Skin: Skin is warm and dry.  Psychiatric: She has a normal mood and affect.  Nursing note and vitals reviewed.    ED Treatments / Results  Labs (all labs ordered are listed, but only abnormal results are displayed) Labs Reviewed  BASIC METABOLIC PANEL - Abnormal; Notable for the following components:  Result Value   Glucose, Bld 152 (*)    Creatinine, Ser 1.01 (*)    GFR calc non Af Amer 56 (*)    All other components within normal limits  CBC    EKG None  Radiology Dg Chest 2 View  Result Date: 01/03/2018 CLINICAL DATA:  Cough, congestion, increased cough with green sputum, sore throat and RIGHT-side headache, history hypertension, smoking, COPD, asthma EXAM: CHEST - 2 VIEW COMPARISON:  06/28/2017 FINDINGS: Normal heart size, mediastinal contours, and pulmonary vascularity. Atherosclerotic calcification aorta. Minimal chronic bronchitic changes. No pulmonary infiltrate, pleural effusion or pneumothorax. Bones demineralized. IMPRESSION: Minimal chronic bronchitic changes without infiltrate. Electronically Signed   By: Lavonia Dana M.D.   On: 01/03/2018 12:23    Procedures Procedures (including critical care time)  Medications Ordered in ED Medications  doxycycline (VIBRA-TABS) tablet 100 mg (has no administration in time range)  ipratropium-albuterol (DUONEB) 0.5-2.5 (3) MG/3ML nebulizer solution 3 mL (3 mLs Nebulization Given  01/03/18 1503)  methylPREDNISolone sodium succinate (SOLU-MEDROL) 125 mg/2 mL injection 125 mg (125 mg Intravenous Given 01/03/18 1523)     Initial Impression / Assessment and Plan / ED Course  I have reviewed the triage vital signs and the nursing notes.  Pertinent labs & imaging results that were available during my care of the patient were reviewed by me and considered in my medical decision making (see chart for details).  Clinical Course as of Jan 04 1627  Wed Jan 03, 2018  1536 Patient reevaluated, reports that she is breathing better, lungs are improved with decreased rhonchi, wheezes, and increased air movement throughout.   [EH]    Clinical Course User Index [EH] Lorin Glass, PA-C   Patient presents today for evaluation of cough, increased sputum purulence and production along with sore throat and right-sided headache for approximately 1 week.  Symptoms are consistent with a viral URI with secondary acute exacerbation of COPD.  She was treated with a DuoNeb and IV steroids in the department after which she reported feeling like she could breathe easier and her lung sounds improved.  Chest x-ray did not show any consolidation or other acute abnormalities.  Patient does report significant headache and anxiety over this headache and is requesting CT scan.  Discussed risks and benefits with patient.  CT scan ordered.  At shift change care was transferred to [facility] PA-C who will follow pending studies, re-evaulate and determine disposition.      Final Clinical Impressions(s) / ED Diagnoses   Final diagnoses:  COPD exacerbation ([place])  Acute nonintractable headache, unspecified headache type    ED Discharge Orders        Ordered    predniSONE (DELTASONE) 10 MG tablet  Daily     01/03/18 1614    doxycycline (VIBRAMYCIN) 100 MG capsule  2 times daily     01/03/18 1614       Lorin Glass, [place] 01/03/18 1628    Nat Christen, MD 01/04/18 1041

## 2018-04-11 ENCOUNTER — Encounter: Payer: Self-pay | Admitting: Family Medicine

## 2018-04-11 ENCOUNTER — Ambulatory Visit (INDEPENDENT_AMBULATORY_CARE_PROVIDER_SITE_OTHER): Payer: Medicare HMO | Admitting: Family Medicine

## 2018-04-11 VITALS — BP 114/75 | HR 66 | Ht 62.0 in | Wt 189.7 lb

## 2018-04-11 DIAGNOSIS — F410 Panic disorder [episodic paroxysmal anxiety] without agoraphobia: Secondary | ICD-10-CM | POA: Insufficient documentation

## 2018-04-11 DIAGNOSIS — J45909 Unspecified asthma, uncomplicated: Secondary | ICD-10-CM | POA: Insufficient documentation

## 2018-04-11 DIAGNOSIS — J3089 Other allergic rhinitis: Secondary | ICD-10-CM | POA: Insufficient documentation

## 2018-04-11 DIAGNOSIS — Z716 Tobacco abuse counseling: Secondary | ICD-10-CM

## 2018-04-11 DIAGNOSIS — F418 Other specified anxiety disorders: Secondary | ICD-10-CM | POA: Diagnosis not present

## 2018-04-11 DIAGNOSIS — K219 Gastro-esophageal reflux disease without esophagitis: Secondary | ICD-10-CM | POA: Insufficient documentation

## 2018-04-11 DIAGNOSIS — R1011 Right upper quadrant pain: Secondary | ICD-10-CM

## 2018-04-11 DIAGNOSIS — Z889 Allergy status to unspecified drugs, medicaments and biological substances status: Secondary | ICD-10-CM | POA: Insufficient documentation

## 2018-04-11 DIAGNOSIS — E7800 Pure hypercholesterolemia, unspecified: Secondary | ICD-10-CM | POA: Insufficient documentation

## 2018-04-11 DIAGNOSIS — R1012 Left upper quadrant pain: Secondary | ICD-10-CM

## 2018-04-11 DIAGNOSIS — F17219 Nicotine dependence, cigarettes, with unspecified nicotine-induced disorders: Secondary | ICD-10-CM

## 2018-04-11 DIAGNOSIS — F17200 Nicotine dependence, unspecified, uncomplicated: Secondary | ICD-10-CM | POA: Insufficient documentation

## 2018-04-11 DIAGNOSIS — R7302 Impaired glucose tolerance (oral): Secondary | ICD-10-CM | POA: Insufficient documentation

## 2018-04-11 DIAGNOSIS — J449 Chronic obstructive pulmonary disease, unspecified: Secondary | ICD-10-CM | POA: Insufficient documentation

## 2018-04-11 DIAGNOSIS — E66811 Obesity, class 1: Secondary | ICD-10-CM | POA: Insufficient documentation

## 2018-04-11 DIAGNOSIS — G8929 Other chronic pain: Secondary | ICD-10-CM

## 2018-04-11 DIAGNOSIS — I10 Essential (primary) hypertension: Secondary | ICD-10-CM | POA: Diagnosis not present

## 2018-04-11 DIAGNOSIS — Z7689 Persons encountering health services in other specified circumstances: Secondary | ICD-10-CM

## 2018-04-11 DIAGNOSIS — K589 Irritable bowel syndrome without diarrhea: Secondary | ICD-10-CM

## 2018-04-11 DIAGNOSIS — E876 Hypokalemia: Secondary | ICD-10-CM | POA: Insufficient documentation

## 2018-04-11 DIAGNOSIS — E669 Obesity, unspecified: Secondary | ICD-10-CM | POA: Insufficient documentation

## 2018-04-11 DIAGNOSIS — J4489 Other specified chronic obstructive pulmonary disease: Secondary | ICD-10-CM | POA: Insufficient documentation

## 2018-04-11 DIAGNOSIS — Z720 Tobacco use: Secondary | ICD-10-CM | POA: Insufficient documentation

## 2018-04-11 DIAGNOSIS — F39 Unspecified mood [affective] disorder: Secondary | ICD-10-CM

## 2018-04-11 MED ORDER — ROSUVASTATIN CALCIUM 20 MG PO TABS: 20.0000 mg | ORAL_TABLET | Freq: Every day | ORAL | 0 refills | Status: DC

## 2018-04-11 MED ORDER — MONTELUKAST SODIUM 10 MG PO TABS: 10.0000 mg | ORAL_TABLET | Freq: Every day | ORAL | 1 refills | Status: DC

## 2018-04-11 MED ORDER — BUSPIRONE HCL 10 MG PO TABS: 10.0000 mg | ORAL_TABLET | Freq: Three times a day (TID) | ORAL | 5 refills | Status: DC

## 2018-04-11 MED ORDER — ALPRAZOLAM 0.25 MG PO TABS: ORAL_TABLET | ORAL | 0 refills | Status: DC

## 2018-04-11 MED ORDER — POTASSIUM CHLORIDE CRYS ER 20 MEQ PO TBCR
20.0000 meq | EXTENDED_RELEASE_TABLET | Freq: Two times a day (BID) | ORAL | 1 refills | Status: DC
Start: 2018-04-11 — End: 2018-07-04

## 2018-04-11 NOTE — Patient Instructions (Signed)
Melissa please put a full set of fasting labs in for the patient for future draw.  Please think seriously about quitting smoking!  This is very important for your health and well being.   Smoking cessation instruction/counseling given:  counseled patient on the dangers of tobacco use, advised patient to stop smoking, and reviewed strategies to maximize success  Discussed with patient that there are multiple treatments to aid in quitting smoking, however I explained none will work unless pt really wants to quit  Please let us know in the future if you are interested and ready to quit.  You can also call 1-800-QUIT-NOW 401 356 3286) for free smoking cessation counseling and support.     Also, please go online to www.heart.org (the American Heart Association website) and search "quit smoking ".     Or try the centers for disease control website at: https://www.schmidt.com/  Or, go to the "national cancer institute" web site of NIH:  http://benson.com/  There is a lot of great information on these websites for you to look over.      Want to Quit Smoking? FDA-Approved Products Can Help  Quitting smoking can be hard, but it is possible. In fact, every time you put out a cigarette is a new chance to try quitting again, according to the U.S. Food and Drug Administration's newest tobacco education campaign, "Every Try Counts."   If you want to quit-almost 70 percent of adult smokers say they do-you may want to use a "smoking cessation" product proven to help. Data has shown that using FDA-approved cessation medicine can double your chance of quitting successfully.  Some products contain nicotine as an active ingredient and others do not. These products include over-the-counter (OTC) options like skin patches, lozenges, and gum, as well as prescription medicines.  Smoking cessation products are intended  to help you quit smoking. They are regulated through the [facility] and Research, which ensures that the products are safe and effective and that their benefits outweigh any known associated risks.  The Benefits of Quitting Smoking No matter how much you smoke-or for how long-quitting will benefit you.  Not only will you lower your risk of getting various cancers, including lung cancer, you'll also reduce your chances of having heart disease, a stroke, emphysema, and other serious diseases. Quitting also will lower the risk of heart disease and lung cancer in nonsmokers who otherwise would be exposed to your secondhand smoke.  Although there are benefits to quitting at any age, it is important to quit as soon as possible so your body can begin to heal from the damage caused by smoking. For instance, 12 hours after you quit smoking the carbon monoxide level in your blood drops to normal. Carbon monoxide is harmful because it displaces oxygen in the blood and deprives your heart, brain, and other vital organs of oxygen.  What To Know About Smoking Cessation Products Understanding how smoking cessation products work-and what side effects they may cause-can help you determine which product may be best for you.  If you're considering one of these products, reading labels and talking to your pharmacist and other health care providers are good first steps to take.  You also can check the FDA's website for more information on each product at Drugs@FDA , where you can search for each product by name.  And remember to weigh each product's benefits and risks, among other considerations.  About Nicotine Replacement Therapy (NRT) Nicotine is the substance primarily responsible for causing  addiction to tobacco products. Tobacco users who are addicted to nicotine are used to having nicotine in their bodies.  As you try to quit smoking, you may have symptoms of nicotine withdrawal. When you  quit, this withdrawal may cause symptoms like cravings, or urges, to smoke; depression; trouble sleeping; irritability; anxiety; and increased appetite.  Nicotine withdrawal can discourage some smokers from continuing with a quit attempt. But the FDA has approved several smoking cessation products designed to help users gradually withdraw from smoking (that is, "wean" themselves from smoking) by using specific amounts of nicotine that decrease over time. This type of product is called a "nicotine replacement therapy" or NRT. It supplies nicotine in controlled amounts while sparing you from other chemicals found in tobacco products.  NRTs are available over the counter and by prescription. You should generally use them only for a short time to help you manage nicotine cravings and withdrawal. However, the FDA recognizes that some people may need to use these products longer to stay smoke-free. Talk to your health care provider to determine the best course of treatment for you.  Over-the-counter NRTs are approved for sale to people age 52 and older. They are available under various brand names and sometimes as generic products. They include:  - Skin patches (also called "transdermal nicotine patches"). These patches are placed on the skin, similar to how you would apply an adhesive bandage. - Chewing gum (also called "nicotine gum"). This gum must be chewed according to the labeled instructions to be effective. - Lozenges (also called "nicotine lozenges"). You use these products by dissolving them in your mouth. For over-the-counter products, it's important to follow the instructions on the Drug Facts Label (DFL) and to read the enclosed User's Guide for complete directions and other important information. Ask your health care provider if you have questions.  Currently, prescription nicotine replacement therapy is available only under the brand name Nicotrol, and is available both as a nasal spray and an  oral inhaler. The products are FDA-approved only for use by adults.  If you are under age 82 and want to quit smoking, talk to a health care professional about whether you should use nicotine replacement therapies.  Important Advice for People Considering Nicotine Replacement Therapy Women who are pregnant or breastfeeding should talk to their health care providers and use nicotine replacement products only if the health care providers approve.  Also talk to your health care provider before using these products if you have:  diabetes, heart disease, asthma, or stomach ulcers; had a recent heart attack; high blood pressure that is not controlled with medicine; a history of irregular heartbeat; or been prescribed medication to help you quit smoking. If you take prescription medication for depression or asthma, tell your health care provider if you are quitting smoking because he or she may need to change your prescription dose.  Stop using a nicotine replacement product and call your health care professional if you have any of the following symptoms: nausea; dizziness; weakness; vomiting; fast or irregular heartbeat; mouth problems with the lozenge or gum; or redness or swelling of the skin around the patch that does not go away.  About Prescription Cessation Medicines Without Nicotine  The FDA has approved two smoking cessation products that do not contain nicotine. They are Chantix (varenicline tartrate) and Zyban (buproprion hydrochloride). Both are available in tablet form and by prescription only.  Chantix acts at sites in the brain affected by nicotine by reducing the rewarding effects  of nicotine. The precise way that Zyban helps with smoking cessation is unknown.  As with other prescription products, the FDA has evaluated these medicines and found that the benefits outweigh the risks. For users taking these products, risks include changes in behavior, depressed mood, hostility,  aggression, and suicidal thoughts or actions.  The most common side effects of Chantix include nausea; constipation; gas; vomiting; and trouble sleeping or vivid, unusual, or strange dreams. Chantix also may change how you react to alcohol, so talk to your health care provider about your drinking habits (if you drink alcohol) and whether these habits need to change. Chantix is not recommended for people under the age of 41.  The most commonly observed side effects consistently associated with the use of Zyban are dry mouth and insomnia.  Because Zyban contains the same active ingredient as the antidepressant Wellbutrin (bupropion), the FDA encourages people who use Zyban-and those who are considering it-to talk to their health care providers about the risks of treatment with antidepressant medicines. Zyban has not been studied in children under the age of 31 and is not approved for use in children and teenagers.  Note: If your health care provider prescribes Chantix or Zyban, please read the product's patient medication guide in its entirety. These guides offer important information on side effects, risks, warnings, product ingredients, and what you should talk about with your health care provider before taking the products.  Finally, if you ever have any side effects related to any smoking cessation products, or have any other problems related to your treatment, the FDA would like to hear from you. Please consider making a voluntary and confidential report to the FDA's MedWatch program.  Updated: May 30, 2016

## 2018-04-11 NOTE — Progress Notes (Signed)
New patient office visit note:  Impression and Recommendations:    1. Establishing care with new doctor, encounter for   2. Benign essential HTN   3. Depression with anxiety   4. Cigarette nicotine dependence with nicotine-induced disorder   5. Tobacco use disorder   6. Tobacco abuse counseling   7. Elevated LDL cholesterol level   8. Chronic obstructive pulmonary disease, unspecified COPD type ([place])   9. Gastroesophageal reflux disease, esophagitis presence not specified   10. Uncomplicated asthma, unspecified asthma severity, unspecified whether persistent   11. Obesity, Class I, BMI 30-34.9   12. Glucose intolerance (impaired glucose tolerance)   13. Irritable bowel syndrome, unspecified type   14. Chronic bilateral upper abdominal pain   15. Panic attacks   16. Mood disorder ([place])   17. Hypokalemia   18. Environmental and seasonal allergies    Mood -Educated pt that alprazolam has negative indications for memory  -Strongly encouraged pt to consider changing her mood medication due to some memory issues -Educated pt about other medications to help with anxiety including buspar -Informed pt that it will take 3-4 weeks for buspar to become maximally effective -Explained to pt that she is unwilling to prescribe alprazolam in patients with memory issues and that we will be willing to refer pt to psychiatry for further evaluation -Instructed pt not to use the medication unless she is actively having a panic attack and told pt that she will not refill the medication after it runs out  -Educated pt that we can try new medications to find ways to treat her symptoms without using alprazolam daily -Explained that mood must also be managed through non-medicinal methods such as exercise, healthy diet and relaxation  Tobacco Abuse -Strongly encouraged pt to quit smoking -Explained how pack years work in healthcare -Explained that she has a significant history that is detrimental  to her health -explained to pt that she needs to quit smoking to help with breathing and help with overall health -instructed pt to try reducing cigarette use by one each day  COPD -Explained to pt that her lungs sound horrible and she likely needs further evaluation by a pulmonologist -Explained that a pulmonologist can better plan medication management of her COPD and offer samples to see what works -Encouraged pt to return for an OV to plan care for her lungs  Cramps -Explained that cramps are usually due to dehydration -Recommended pt to increase their water intake to half of their body weight in ounces.  GI -Referred pt to Gastroenterology  Falls -Strongly encouraged pt to consider bed rails to help protect her from falls -Explained that it is possible to seriously injure oneself with falls in older age  Health Management -Pt to return for FBW -Instructed pt to return for blood work in order to have all her medications refilled -Explained the significant importance of healthy diet and exercise in preventing chronic disease -Explained HIPAA requirements of the healthcare professionals in the office -Educated pt that each medicine has SE but some are worth the risk due to benefit of overall health -Discussed the importance of regular appointments in order to monitor or prevent progression of chronic diseases -Explained the role of the nurse practitioner in our clinic and scope of care  Pt was in the office today for 32.5+ minutes, with over 50% time spent in face to face counseling of patients various medical conditions, treatment plans of those medical conditions including medicine management and lifestyle  modification, strategies to improve health and well being; and in coordination of care. SEE ABOVE TREATMENT PLAN FOR DETAILS     Education and routine counseling performed. Handouts provided.   Do not remove these below:  Orders Placed This Encounter  Procedures  .  Ambulatory referral to Gastroenterology    Meds ordered this encounter  Medications  . ALPRAZolam (XANAX) 0.25 MG tablet    Sig: 1 tab as needed for PANIC attack only    Dispense:  60 tablet    Refill:  0  . montelukast (SINGULAIR) 10 MG tablet    Sig: Take 1 tablet (10 mg total) by mouth at bedtime.    Dispense:  90 tablet    Refill:  1  . potassium chloride SA (K-DUR,KLOR-CON) 20 MEQ tablet    Sig: Take 1 tablet (20 mEq total) by mouth 2 (two) times daily.    Dispense:  60 tablet    Refill:  1  . rosuvastatin (CRESTOR) 20 MG tablet    Sig: Take 1 tablet (20 mg total) by mouth at bedtime.    Dispense:  90 tablet    Refill:  0  . busPIRone (BUSPAR) 10 MG tablet    Sig: Take 1 tablet (10 mg total) by mouth 3 (three) times daily.    Dispense:  90 tablet    Refill:  5    Medications Discontinued During This Encounter  Medication Reason  . ANORO ELLIPTA 62.5-25 MCG/INH AEPB Discontinued by provider  . calcium carbonate (OS-CAL) 600 MG TABS tablet Patient Preference  . cetirizine (ZYRTEC) 10 MG tablet Change in therapy  . doxycycline (VIBRAMYCIN) 100 MG capsule Completed Course  . Fish Oil-Cholecalciferol (OMEGA-3 FISH OIL/VITAMIN D3 PO) Patient Preference  . fluorometholone (FML) 0.1 % ophthalmic suspension Patient Preference  . mupirocin ointment (BACTROBAN) 2 % Completed Course  . potassium gluconate 595 MG TABS tablet Patient Preference  . pravastatin (PRAVACHOL) 40 MG tablet Change in therapy  . RESTASIS 0.05 % ophthalmic emulsion Discontinued by provider  . ALPRAZolam (XANAX) 0.25 MG tablet Reorder  . montelukast (SINGULAIR) 10 MG tablet Reorder  . potassium chloride SA (K-DUR,KLOR-CON) 20 MEQ tablet Reorder  . rosuvastatin (CRESTOR) 20 MG tablet Reorder      Gross side effects, risk and benefits, and alternatives of medications discussed with patient.  Patient is aware that all medications have potential side effects and we are unable to predict every side effect or  drug-drug interaction that may occur.  Expresses verbal understanding and consents to current therapy plan and treatment regimen.  Return for 30-Chronic OV w me near future WITH FBW 3d prior.  Please see AVS handed out to patient at the end of our visit for further patient instructions/ counseling done pertaining to today's office visit.    Note:  This document was prepared using Dragon voice recognition software and may include unintentional dictation errors.  This document serves as a record of services personally performed by Mellody Dance, MD. It was created on her behalf by Georga Bora, a trained medical scribe. The creation of this record is based on the scribe's personal observations and the provider's statements to them.   I have reviewed the above medical documentation for accuracy and completeness and I concur.  Mellody Dance, D.O.       ---------------------------------------------------------------------------------------------------------------------------------------------------------------------------------------------    Subjective:    Chief complaint:   Chief Complaint  Patient presents with  . Establish Care     HPI: Tiffany Murphy is a pleasant 67  y.o. female who presents to [place] at [facility] today to review their medical history with me and establish care.   I asked the patient to review their chronic problem list with me to ensure everything was updated and accurate.    All recent office visits with other providers, any medical records that patient brought in etc  - I reviewed today.     We asked pt to get [place] their medical records from [facility] providers/ specialists that they had seen within the past 3-5 years- if they are in private practice and/or do not work for [employer], [facility], [facility], [facility] or [employer] owned practice.  Told them to call their specialists to clarify this if they are not sure.   Lifestyle -Pt has 8  grandchildren and 3 great-grandchildren -Cares for a 93 year old grandchild -says she hasn't been taking care of herself and feels "so bad all the time"   Medical Hx GI -Pt has IBS and hiatal hernia -Pt has chronic abdominal pain, 30 years of history -States she has a stabbing pain in her abdomen -Says she was evaluated by Dr. Lyndel Safe but he moved to [place] would like referral to a closer gastroenterologist -States it has been going on for 30 years; started intermittently but says in the last 4-5 years "it's gotten so bad I can hardly stand it  COPD -Sees Dr. Gardenia Phlegm for pulmonology in [place] a pulmonologist closer to home, in [place] -Is taking Albuterol, singulair and montelukast -Sees nurse practitioners who work under his license  Cardiology -Sees Dr. Percival Spanish -has had EKG's that came back negative -Has had some slight chest pains, still occur occasionally  -Hx of HLD -Hx of HTN -Hx of Hiatal hernia -Prediabetic  Memory -pt states  Mood -Pt has been taking alprazolam "for years and years" -Says she's usually only taking them once per day, but occasionally will take two or three depending on how bad she is feeling -States she had been taking maximum strength four times per day and decided to quit -Says she has always struggled with depression  -Previously saw a psychiatrist "a long time ago" but states they gave her a bunch of medication "that made [me] gain weight, I went from 117 in August to 150 in December"  Joint Pain -Pt has been prescribed gabapentin to help with joint pain -Pt states she struggles with knee and back pain  Potassium deficiency -pt says she gets really bad leg cramps each night so she was prescribed potassium  Family Hx -Father diagnosed with Alzheimer's in early 3s, DM in his 90's -Mother had CHF, depression and had suicidal attempts   Surgical Hx    Social Hx Tobacco Abuse -Pt does smoke, has smoked since 67  years old -States her grandpa and aunt used to sneak them to her -had gotten up to 2 packs a day for roughly 20 years -Smoked about a pack a day for 20 years -Now she smokes roughly 1/2 per day -states she had quit using chantix in the past and it worked, but she started again -Pt wants to quit     Wt Readings from Last 3 Encounters:  04/11/18 189 lb 11.2 oz (86 kg)  01/22/15 183 lb (83 kg)  12/01/14 183 lb 8 oz (83.2 kg)   BP Readings from Last 3 Encounters:  04/11/18 114/75  01/03/18 (!) 144/60  12/01/14 138/78   Pulse Readings from Last 3 Encounters:  04/11/18 66  01/03/18 64  12/01/14 77   BMI Readings from Last 3 Encounters:  04/11/18 34.70 kg/m  01/22/15 33.47 kg/m  12/01/14 33.56 kg/m    Patient Care Team    Relationship Specialty Notifications Start End  Mellody Dance, DO PCP - General Family Medicine  04/11/18   Minus Breeding, MD Consulting Physician Cardiology  04/11/18   Gardiner Rhyme, MD Referring Physician Specialist  04/11/18   Jackquline Denmark, MD Consulting Physician Gastroenterology  04/11/18   Jolene Schimke    04/11/18   Yvone Neu, MD Referring Physician Family Medicine  04/11/18     Patient Active Problem List   Diagnosis Date Noted  . COPD (chronic obstructive pulmonary disease) ([place]) 04/11/2018    Priority: High  . Tobacco use disorder 04/11/2018  . Elevated LDL cholesterol level 04/11/2018  . GERD (gastroesophageal reflux disease) 04/11/2018  . Asthma 04/11/2018  . Obesity, Class I, BMI 30-34.9 04/11/2018  . Glucose intolerance (impaired glucose tolerance) 04/11/2018  . Irritable bowel syndrome 04/11/2018  . Chronic bilateral upper abdominal pain 04/11/2018  . Panic attacks 04/11/2018  . Mood disorder ([place]) 04/11/2018  . Hypokalemia 04/11/2018  . Environmental and seasonal allergies 04/11/2018  . Elevated coronary artery calcium score 12/01/2014  . Benign essential HTN 07/11/2013  . Depression with anxiety 07/11/2013  .  Pure hypercholesterolemia 07/11/2013  . Nicotine addiction 06/15/2013  . Overweight 06/15/2013       As reported by pt:  Past Medical History:  Diagnosis Date  . Asthma   . COPD (chronic obstructive pulmonary disease) ([place])   . GERD (gastroesophageal reflux disease)   . Hypertension   . Tobacco abuse      Past Surgical History:  Procedure Laterality Date  . APPENDECTOMY    . VAGINAL HYSTERECTOMY       Family History  Problem Relation Age of Onset  . Alzheimer's disease Father 64  . Diabetes Father   . CAD Mother 56     Social History   Substance and Sexual Activity  Drug Use No     Social History   Substance and Sexual Activity  Alcohol Use No  . Alcohol/week: 0.0 standard drinks     Social History   Tobacco Use  Smoking Status Current Every Day Smoker  . Packs/day: 0.50  . Years: 44.00  . Pack years: 22.00  . Types: Cigarettes  Smokeless Tobacco Never Used     Current Meds  Medication Sig  . albuterol (PROVENTIL HFA;VENTOLIN HFA) 108 (90 BASE) MCG/ACT inhaler Inhale 2 puffs into the lungs every 6 (six) hours as needed for wheezing.  Marland Kitchen ALPRAZolam (XANAX) 0.25 MG tablet 1 tab as needed for PANIC attack only  . dicyclomine (BENTYL) 10 MG capsule Take 1 capsule (10 mg total) by mouth 4 (four) times daily.  . fluticasone (FLONASE) 50 MCG/ACT nasal spray Place 2 sprays into both nostrils daily.  Marland Kitchen gabapentin (NEURONTIN) 300 MG capsule Take 2 capsules by mouth daily.  Marland Kitchen lisinopril-hydrochlorothiazide (PRINZIDE,ZESTORETIC) 20-25 MG per tablet TAKE ONE TABLET BY MOUTH ONCE DAILY "OV NEEDED FOR ADDITIONAL VISITS"  . montelukast (SINGULAIR) 10 MG tablet Take 1 tablet (10 mg total) by mouth at bedtime.  Marland Kitchen omeprazole (PRILOSEC) 40 MG capsule Take 1 capsule by mouth daily.  . potassium chloride SA (K-DUR,KLOR-CON) 20 MEQ tablet Take 1 tablet (20 mEq total) by mouth 2 (two) times daily.  . rosuvastatin (CRESTOR) 20 MG tablet Take 1 tablet (20 mg total) by  mouth at bedtime.  . sertraline (  ZOLOFT) 100 MG tablet Take 200 mg by mouth daily.   . SYMBICORT 160-4.5 MCG/ACT inhaler Take 2 puffs by mouth daily.  . [DISCONTINUED] ALPRAZolam (XANAX) 0.25 MG tablet Take 1-2 tablets by mouth daily.  . [DISCONTINUED] montelukast (SINGULAIR) 10 MG tablet Take 10 mg by mouth at bedtime.  . [DISCONTINUED] potassium chloride SA (K-DUR,KLOR-CON) 20 MEQ tablet Take by mouth.  . [DISCONTINUED] rosuvastatin (CRESTOR) 20 MG tablet Take 20 mg by mouth daily.    Allergies: Penicillins; Lovastatin; and Other   Review of Systems  Constitutional: Negative for chills, diaphoresis, fever, malaise/fatigue and weight loss.  HENT: Negative for congestion, sore throat and tinnitus.   Eyes: Negative for blurred vision, double vision and photophobia.  Respiratory: Negative for cough and wheezing.   Cardiovascular: Negative for chest pain and palpitations.  Gastrointestinal: Negative for blood in stool, diarrhea, nausea and vomiting.  Genitourinary: Negative for dysuria, frequency and urgency.  Musculoskeletal: Negative for joint pain and myalgias.  Skin: Negative for itching and rash.  Neurological: Negative for dizziness, focal weakness, weakness and headaches.  Endo/Heme/Allergies: Negative for environmental allergies and polydipsia. Does not bruise/bleed easily.  Psychiatric/Behavioral: Negative for depression and memory loss. The patient is not nervous/anxious and does not have insomnia.         Objective:   Blood pressure 114/75, pulse 66, height 5\' 2"  (1.575 m), weight 189 lb 11.2 oz (86 kg), SpO2 96 %. Body mass index is 34.7 kg/m. General: Well Developed, well nourished, and in no acute distress.  Neuro: Alert and oriented x3, extra-ocular muscles intact, sensation grossly intact.  HEENT:[place]/AT, PERRLA, neck supple, No carotid bruits Skin: no gross rashes  Cardiac: Regular rate and rhythm Respiratory: -Diffuse inspiratory and expiratory wheezes and  coarse breath sounds Abdominal: not grossly distended Musculoskeletal: Ambulates w/o diff, FROM * 4 ext.  Vasc: less 2 sec cap RF, warm and pink  Psych:  No HI/SI, judgement and insight good, Euthymic mood. Full Affect.   Recent Results (from the past 2160 hour(s))  VITAMIN D 25 Hydroxy (Vit-D Deficiency, Fractures)     Status: Abnormal   Collection Time: 04/26/18  9:41 AM  Result Value Ref Range   Vit D, 25-Hydroxy 24.1 (L) 30.0 - 100.0 ng/mL    Comment: Vitamin D deficiency has been defined by the [place] practice guideline as a level of serum 25-OH vitamin D less than 20 ng/mL (1,2). The Endocrine Society went on to further define vitamin D insufficiency as a level between 21 and 29 ng/mL (2). 1. IOM (Institute of Medicine). 2010. Dietary reference    intakes for calcium and D. [place]: The    [employer]. 2. Holick MF, Binkley [place], Bischoff-Ferrari HA, et al.    Evaluation, treatment, and prevention of vitamin D    deficiency: an Endocrine Society clinical practice    guideline. JCEM. 2011 Jul; 96(7):1911-30.   TSH     Status: None   Collection Time: 04/26/18  9:41 AM  Result Value Ref Range   TSH 2.980 0.450 - 4.500 uIU/mL  T4, free     Status: None   Collection Time: 04/26/18  9:41 AM  Result Value Ref Range   Free T4 0.89 0.82 - 1.77 ng/dL  Lipid panel     Status: None   Collection Time: 04/26/18  9:41 AM  Result Value Ref Range   Cholesterol, Total 147 100 - 199 mg/dL   Triglycerides 139 0 - 149 mg/dL  HDL 50 >39 mg/dL   VLDL Cholesterol Cal 28 5 - 40 mg/dL   LDL Calculated 69 0 - 99 mg/dL   Chol/HDL Ratio 2.9 0.0 - 4.4 ratio    Comment:                                   T. Chol/HDL Ratio                                             Men  Women                               1/2 Avg.Risk  3.4    3.3                                   Avg.Risk  5.0    4.4                                2X Avg.Risk  9.6    7.1                                 3X Avg.Risk 23.4   11.0   Hemoglobin A1c     Status: Abnormal   Collection Time: 04/26/18  9:41 AM  Result Value Ref Range   Hgb A1c MFr Bld 6.2 (H) 4.8 - 5.6 %    Comment:          Prediabetes: 5.7 - 6.4          Diabetes: >6.4          Glycemic control for adults with diabetes: <7.0    Est. average glucose Bld gHb Est-mCnc 131 mg/dL  Comprehensive metabolic panel     Status: Abnormal   Collection Time: 04/26/18  9:41 AM  Result Value Ref Range   Glucose 125 (H) 65 - 99 mg/dL   BUN 14 8 - 27 mg/dL   Creatinine, Ser 0.96 0.57 - 1.00 mg/dL   GFR calc non Af Amer 61 >59 mL/min/1.73   GFR calc Af Amer 70 >59 mL/min/1.73   BUN/Creatinine Ratio 15 12 - 28   Sodium 142 134 - 144 mmol/L   Potassium 3.9 3.5 - 5.2 mmol/L   Chloride 102 96 - 106 mmol/L   CO2 25 20 - 29 mmol/L   Calcium 9.1 8.7 - 10.3 mg/dL   Total Protein 6.2 6.0 - 8.5 g/dL   Albumin 4.1 3.6 - 4.8 g/dL   Globulin, Total 2.1 1.5 - 4.5 g/dL   Albumin/Globulin Ratio 2.0 1.2 - 2.2   Bilirubin Total 0.4 0.0 - 1.2 mg/dL   Alkaline Phosphatase 71 39 - 117 IU/L   AST 20 0 - 40 IU/L   ALT 21 0 - 32 IU/L  CBC with Differential/Platelet     Status: None   Collection Time: 04/26/18  9:41 AM  Result Value Ref Range   WBC 5.7 3.4 - 10.8 x10E3/uL   RBC 4.65 3.77 - 5.28 x10E6/uL   Hemoglobin 14.0 11.1 - 15.9 g/dL   Hematocrit  41.9 34.0 - 46.6 %   MCV 90 79 - 97 fL   MCH 30.1 26.6 - 33.0 pg   MCHC 33.4 31.5 - 35.7 g/dL   RDW 13.2 12.3 - 15.4 %   Platelets 210 150 - 450 x10E3/uL   Neutrophils 60 Not Estab. %   Lymphs 26 Not Estab. %   Monocytes 9 Not Estab. %   Eos 3 Not Estab. %   Basos 1 Not Estab. %   Neutrophils Absolute 3.5 1.4 - 7.0 x10E3/uL   Lymphocytes Absolute 1.5 0.7 - 3.1 x10E3/uL   Monocytes Absolute 0.5 0.1 - 0.9 x10E3/uL   EOS (ABSOLUTE) 0.2 0.0 - 0.4 x10E3/uL   Basophils Absolute 0.1 0.0 - 0.2 x10E3/uL   Immature Granulocytes 1 Not Estab. %   Immature Grans (Abs) 0.0 0.0 - 0.1  x10E3/uL

## 2018-04-13 ENCOUNTER — Other Ambulatory Visit: Payer: Self-pay

## 2018-04-13 DIAGNOSIS — R931 Abnormal findings on diagnostic imaging of heart and coronary circulation: Secondary | ICD-10-CM

## 2018-04-13 DIAGNOSIS — R7302 Impaired glucose tolerance (oral): Secondary | ICD-10-CM

## 2018-04-13 DIAGNOSIS — E7800 Pure hypercholesterolemia, unspecified: Secondary | ICD-10-CM

## 2018-04-13 DIAGNOSIS — F39 Unspecified mood [affective] disorder: Secondary | ICD-10-CM

## 2018-04-13 DIAGNOSIS — J449 Chronic obstructive pulmonary disease, unspecified: Secondary | ICD-10-CM

## 2018-04-13 DIAGNOSIS — E669 Obesity, unspecified: Secondary | ICD-10-CM

## 2018-04-13 DIAGNOSIS — E876 Hypokalemia: Secondary | ICD-10-CM

## 2018-04-13 DIAGNOSIS — I10 Essential (primary) hypertension: Secondary | ICD-10-CM

## 2018-04-26 ENCOUNTER — Other Ambulatory Visit: Payer: Medicare HMO

## 2018-04-26 DIAGNOSIS — J449 Chronic obstructive pulmonary disease, unspecified: Secondary | ICD-10-CM

## 2018-04-26 DIAGNOSIS — R7302 Impaired glucose tolerance (oral): Secondary | ICD-10-CM

## 2018-04-26 DIAGNOSIS — F39 Unspecified mood [affective] disorder: Secondary | ICD-10-CM

## 2018-04-26 DIAGNOSIS — I10 Essential (primary) hypertension: Secondary | ICD-10-CM

## 2018-04-26 DIAGNOSIS — E7800 Pure hypercholesterolemia, unspecified: Secondary | ICD-10-CM

## 2018-04-26 DIAGNOSIS — R931 Abnormal findings on diagnostic imaging of heart and coronary circulation: Secondary | ICD-10-CM

## 2018-04-26 DIAGNOSIS — E876 Hypokalemia: Secondary | ICD-10-CM

## 2018-04-26 DIAGNOSIS — E669 Obesity, unspecified: Secondary | ICD-10-CM

## 2018-04-27 LAB — COMPREHENSIVE METABOLIC PANEL
ALBUMIN: 4.1 g/dL (ref 3.6–4.8)
ALK PHOS: 71 IU/L (ref 39–117)
ALT: 21 IU/L (ref 0–32)
AST: 20 IU/L (ref 0–40)
Albumin/Globulin Ratio: 2 (ref 1.2–2.2)
BUN / CREAT RATIO: 15 (ref 12–28)
BUN: 14 mg/dL (ref 8–27)
Bilirubin Total: 0.4 mg/dL (ref 0.0–1.2)
CALCIUM: 9.1 mg/dL (ref 8.7–10.3)
CO2: 25 mmol/L (ref 20–29)
CREATININE: 0.96 mg/dL (ref 0.57–1.00)
Chloride: 102 mmol/L (ref 96–106)
GFR calc Af Amer: 70 mL/min/{1.73_m2} (ref >59)
GFR, EST NON AFRICAN AMERICAN: 61 mL/min/{1.73_m2} (ref >59)
GLOBULIN, TOTAL: 2.1 g/dL (ref 1.5–4.5)
GLUCOSE: 125 mg/dL — AB (ref 65–99)
Potassium: 3.9 mmol/L (ref 3.5–5.2)
SODIUM: 142 mmol/L (ref 134–144)
Total Protein: 6.2 g/dL (ref 6.0–8.5)

## 2018-04-27 LAB — CBC WITH DIFFERENTIAL/PLATELET
BASOS: 1 %
Basophils Absolute: 0.1 10*3/uL (ref 0.0–0.2)
EOS (ABSOLUTE): 0.2 10*3/uL (ref 0.0–0.4)
EOS: 3 %
HEMATOCRIT: 41.9 % (ref 34.0–46.6)
Hemoglobin: 14 g/dL (ref 11.1–15.9)
Immature Grans (Abs): 0 10*3/uL (ref 0.0–0.1)
Immature Granulocytes: 1 %
LYMPHS ABS: 1.5 10*3/uL (ref 0.7–3.1)
Lymphs: 26 %
MCH: 30.1 pg (ref 26.6–33.0)
MCHC: 33.4 g/dL (ref 31.5–35.7)
MCV: 90 fL (ref 79–97)
MONOCYTES: 9 %
Monocytes Absolute: 0.5 10*3/uL (ref 0.1–0.9)
Neutrophils Absolute: 3.5 10*3/uL (ref 1.4–7.0)
Neutrophils: 60 %
Platelets: 210 10*3/uL (ref 150–450)
RBC: 4.65 x10E6/uL (ref 3.77–5.28)
RDW: 13.2 % (ref 12.3–15.4)
WBC: 5.7 10*3/uL (ref 3.4–10.8)

## 2018-04-27 LAB — VITAMIN D 25 HYDROXY (VIT D DEFICIENCY, FRACTURES): Vit D, 25-Hydroxy: 24.1 ng/mL — ABNORMAL LOW (ref 30.0–100.0)

## 2018-04-27 LAB — LIPID PANEL
CHOL/HDL RATIO: 2.9 ratio (ref 0.0–4.4)
CHOLESTEROL TOTAL: 147 mg/dL (ref 100–199)
HDL: 50 mg/dL (ref >39)
LDL Calculated: 69 mg/dL (ref 0–99)
TRIGLYCERIDES: 139 mg/dL (ref 0–149)
VLDL Cholesterol Cal: 28 mg/dL (ref 5–40)

## 2018-04-27 LAB — HEMOGLOBIN A1C
Est. average glucose Bld gHb Est-mCnc: 131 mg/dL
Hgb A1c MFr Bld: 6.2 % — ABNORMAL HIGH (ref 4.8–5.6)

## 2018-04-27 LAB — T4, FREE: FREE T4: 0.89 ng/dL (ref 0.82–1.77)

## 2018-04-27 LAB — TSH: TSH: 2.98 u[IU]/mL (ref 0.450–4.500)

## 2018-04-30 ENCOUNTER — Ambulatory Visit (INDEPENDENT_AMBULATORY_CARE_PROVIDER_SITE_OTHER): Payer: Medicare HMO | Admitting: Family Medicine

## 2018-04-30 ENCOUNTER — Encounter: Payer: Self-pay | Admitting: Family Medicine

## 2018-04-30 VITALS — BP 138/76 | HR 69 | Temp 98.4°F | Ht 62.0 in | Wt 196.5 lb

## 2018-04-30 DIAGNOSIS — G4733 Obstructive sleep apnea (adult) (pediatric): Secondary | ICD-10-CM | POA: Diagnosis not present

## 2018-04-30 DIAGNOSIS — R5383 Other fatigue: Secondary | ICD-10-CM | POA: Insufficient documentation

## 2018-04-30 DIAGNOSIS — R911 Solitary pulmonary nodule: Secondary | ICD-10-CM | POA: Insufficient documentation

## 2018-04-30 DIAGNOSIS — I10 Essential (primary) hypertension: Secondary | ICD-10-CM

## 2018-04-30 DIAGNOSIS — Z716 Tobacco abuse counseling: Secondary | ICD-10-CM

## 2018-04-30 DIAGNOSIS — E785 Hyperlipidemia, unspecified: Secondary | ICD-10-CM

## 2018-04-30 DIAGNOSIS — E559 Vitamin D deficiency, unspecified: Secondary | ICD-10-CM

## 2018-04-30 DIAGNOSIS — F39 Unspecified mood [affective] disorder: Secondary | ICD-10-CM

## 2018-04-30 DIAGNOSIS — J449 Chronic obstructive pulmonary disease, unspecified: Secondary | ICD-10-CM

## 2018-04-30 DIAGNOSIS — F17219 Nicotine dependence, cigarettes, with unspecified nicotine-induced disorders: Secondary | ICD-10-CM

## 2018-04-30 DIAGNOSIS — F410 Panic disorder [episodic paroxysmal anxiety] without agoraphobia: Secondary | ICD-10-CM

## 2018-04-30 DIAGNOSIS — R7302 Impaired glucose tolerance (oral): Secondary | ICD-10-CM

## 2018-04-30 DIAGNOSIS — R5382 Chronic fatigue, unspecified: Secondary | ICD-10-CM

## 2018-04-30 DIAGNOSIS — E669 Obesity, unspecified: Secondary | ICD-10-CM

## 2018-04-30 MED ORDER — ALBUTEROL SULFATE HFA 108 (90 BASE) MCG/ACT IN AERS: 2.0000 | INHALATION_SPRAY | Freq: Four times a day (QID) | RESPIRATORY_TRACT | 2 refills | Status: DC | PRN

## 2018-04-30 MED ORDER — VITAMIN D (ERGOCALCIFEROL) 1.25 MG (50000 UNIT) PO CAPS: 50000.0000 [IU] | ORAL_CAPSULE | ORAL | 10 refills | Status: DC

## 2018-04-30 NOTE — Patient Instructions (Addendum)
-Please contact your pulmonologist\sleep doctor regarding questions about your obstructive sleep apnea machine and all.  This is important to use nightly in order to help with your energy levels etc.  She would like a referral to pulmonologist in [place], let me know  -    Risk factors for prediabetes and type 2 diabetes  Researchers don't fully understand why some people develop prediabetes and type 2 diabetes and others don't.  It's clear that certain factors increase the risk, however, including:  Weight. The more fatty tissue you have, the more resistant your cells become to insulin.  Inactivity. The less active you are, the greater your risk. Physical activity helps you control your weight, uses up glucose as energy and makes your cells more sensitive to insulin.  Family history. Your risk increases if a parent or sibling has type 2 diabetes.  Race. Although it's unclear why, people of certain races -- including blacks, Hispanics, American Indians and Asian-Americans -- are at higher risk.  Age. Your risk increases as you get older. This may be because you tend to exercise less, lose muscle mass and gain weight as you age. But type 2 diabetes is also increasing dramatically among children, adolescents and younger adults.  Gestational diabetes. If you developed gestational diabetes when you were pregnant, your risk of developing prediabetes and type 2 diabetes later increases. If you gave birth to a baby weighing more than 9 pounds (4 kilograms), you're also at risk of type 2 diabetes.  Polycystic ovary syndrome. For women, having polycystic ovary syndrome -- a common condition characterized by irregular menstrual periods, excess hair growth and obesity -- increases the risk of diabetes.  High blood pressure. Having blood pressure over 140/90 millimeters of mercury (mm Hg) is linked to an increased risk of type 2 diabetes.  Abnormal cholesterol and triglyceride levels. If you have low levels  of high-density lipoprotein (HDL), or "good," cholesterol, your risk of type 2 diabetes is higher. Triglycerides are another type of fat carried in the blood. People with high levels of triglycerides have an increased risk of type 2 diabetes. Your doctor can let you know what your cholesterol and triglyceride levels are.  A good guide to good carbs: The glycemic index ---If you have diabetes, or at risk for diabetes, you know all too well that when you eat carbohydrates, your blood sugar goes up. The total amount of carbs you consume at a meal or in a snack mostly determines what your blood sugar will do. But the food itself also plays a role. A serving of white rice has almost the same effect as eating pure table sugar -- a quick, high spike in blood sugar. A serving of lentils has a slower, smaller effect.  ---Picking good sources of carbs can help you control your blood sugar and your weight. Even if you don't have diabetes, eating healthier carbohydrate-rich foods can help ward off a host of chronic conditions, from heart disease to various cancers to, well, diabetes.  ---One way to choose foods is with the glycemic index (GI). This tool measures how much a food boosts blood sugar.  The glycemic index rates the effect of a specific amount of a food on blood sugar compared with the same amount of pure glucose. A food with a glycemic index of 28 boosts blood sugar only 28% as much as pure glucose. One with a GI of 95 acts like pure glucose.    High glycemic foods result in a quick spike  in insulin and blood sugar (also known as blood glucose).  Low glycemic foods have a slower, smaller effect- these are healthier for you.   Using the glycemic index Using the glycemic index is easy: choose foods in the low GI category instead of those in the high GI category (see below), and go easy on those in between. Low glycemic index (GI of 55 or less): Most fruits and vegetables, beans, minimally processed  grains, pasta, low-fat dairy foods, and nuts.  Moderate glycemic index (GI 56 to 69): White and sweet potatoes, corn, white rice, couscous, breakfast cereals such as Cream of Wheat and Mini Wheats.  High glycemic index (GI of 70 or higher): White bread, rice cakes, most crackers, bagels, cakes, doughnuts, croissants, most packaged breakfast cereals. You can see the values for 100 commons foods and get links to more at www.[email or web address].si.  Swaps for lowering glycemic index  Instead of this high-glycemic index food Eat this lower-glycemic index food  White rice Brown rice or converted rice  Instant oatmeal Steel-cut oats  Cornflakes Bran flakes  Baked potato Pasta, bulgur  White bread Whole-grain bread  Corn Peas or leafy greens       Prediabetes Eating Plan  Prediabetes--also called impaired glucose tolerance or impaired fasting glucose--is a condition that causes blood sugar (blood glucose) levels to be higher than normal. Following a healthy diet can help to keep prediabetes under control. It can also help to lower the risk of type 2 diabetes and heart disease, which are increased in people who have prediabetes. Along with regular exercise, a healthy diet:  Promotes weight loss.  Helps to control blood sugar levels.  Helps to improve the way that the body uses insulin.   WHAT DO I NEED TO KNOW ABOUT THIS EATING PLAN?   Use the glycemic index (GI) to plan your meals. The index tells you how quickly a food will raise your blood sugar. Choose low-GI foods. These foods take a longer time to raise blood sugar.  Pay close attention to the amount of carbohydrates in the food that you eat. Carbohydrates increase blood sugar levels.  Keep track of how many calories you take in. Eating the right amount of calories will help you to achieve a healthy weight. Losing about 7 percent of your starting weight can help to prevent type 2 diabetes.  You may want to follow a  Mediterranean diet. This diet includes a lot of vegetables, lean meats or fish, whole grains, fruits, and healthy oils and fats.   WHAT FOODS CAN I EAT?  Grains Whole grains, such as whole-wheat or whole-grain breads, crackers, cereals, and pasta. Unsweetened oatmeal. Bulgur. Barley. Quinoa. Brown rice. Corn or whole-wheat flour tortillas or taco shells. Vegetables Lettuce. Spinach. Peas. Beets. Cauliflower. Cabbage. Broccoli. Carrots. Tomatoes. Squash. Eggplant. Herbs. Peppers. Onions. Cucumbers. Brussels sprouts. Fruits Berries. Bananas. Apples. Oranges. Grapes. Papaya. Mango. Pomegranate. Kiwi. Grapefruit. Cherries. Meats and Other Protein Sources Seafood. Lean meats, such as chicken and [place] or lean cuts of pork and beef. Tofu. Eggs. Nuts. Beans. Dairy Low-fat or fat-free dairy products, such as yogurt, cottage cheese, and cheese. Beverages Water. Tea. Coffee. Sugar-free or diet soda. Seltzer water. Milk. Milk alternatives, such as soy or almond milk. Condiments Mustard. Relish. Low-fat, low-sugar ketchup. Low-fat, low-sugar barbecue sauce. Low-fat or fat-free mayonnaise. Sweets and Desserts Sugar-free or low-fat pudding. Sugar-free or low-fat ice cream and other frozen treats. Fats and Oils Avocado. Walnuts. Olive oil. The items listed above may not be a complete  list of recommended foods or beverages. Contact your dietitian for more options.    WHAT FOODS ARE NOT RECOMMENDED?  Grains Refined white flour and flour products, such as bread, pasta, snack foods, and cereals. Beverages Sweetened drinks, such as sweet iced tea and soda. Sweets and Desserts Baked goods, such as cake, cupcakes, pastries, cookies, and cheesecake. The items listed above may not be a complete list of foods and beverages to avoid. Contact your dietitian for more information.   This information is not intended to replace advice given to you by your health care provider. Make sure you discuss any  questions you have with your health care provider.   Document Released: 10/21/2014 Document Reviewed: 10/21/2014 Elsevier Interactive Patient Education [employer].

## 2018-04-30 NOTE — Progress Notes (Signed)
Assessment and plan:  1. Glucose intolerance (impaired glucose tolerance)   2. Benign essential HTN   3. OSA and COPD overlap syndrome ([place])   4. Chronic obstructive pulmonary disease, unspecified COPD type (HCC)   5. Obesity, Class I, BMI 30-34.9   6. Mood disorder ([place])   7. Panic attacks   8. Hyperlipidemia with target LDL less than 70   9. Cigarette nicotine dependence with nicotine-induced disorder   10. Lung nodule seen on imaging study   11. Tobacco abuse counseling   12. Chronic fatigue   13. Vitamin D deficiency     1. Reviewed recent lab work (04/26/2018) in depth with patient today.  All lab work within normal limits unless otherwise noted.  2. Vitamin D Deficiency - 24.1 - Begin once weekly Vitamin D supplementation. - Re-check in 4-6 months.  - Educated patient about importance for energy levels, strong bones, and mood.  3. Prediabetes - Glucose Intolerance - HbA1c = 6.2. - Reviewed that if HbA1c increases above 6.4, she will become diabetic.  - Counseled patient on prevention of disease and discussed dietary and prudent lifestyle modifications as first line.  Importance of low carb/ketogenic diet discussed with patient in addition to regular exercise.   - Handout provided on prediabetes and prevention of diabetes.  - Re-check in 4 months.  4. Kidney Function - Reviewed that patient's kidney function has improved since last check.  - Discussed critical importance of hydration and exercise to preserve kidney health. - Patient should also avoid nephrotoxic substances.  5. Lipid Panel - Managed on Statins - Patient is currently managed on statins.  Triglycerides = 139 HDL = 50 LDL = 69  - Cholesterol remains stable at this time. - Continue treatment plan as prescribed.  See med list below. - Patient tolerating meds well without complication.  Denies S-E.  - Dietary changes such as low  saturated & trans fat and low carb/ ketogenic diets discussed with patient.  Encouraged regular exercise and weight loss when appropriate.   - Educational handouts provided at patient's desire.  6. Pulmonology - COPD, Nodule in Left Lung, Sleep Apnea - Need for referral to pulmonology in January due to patient's financial difficulties.   - Advised patient to continue following up with pulmonology to monitor her left lung nodule. - Patient understands that she should continue to obtain specialty care.  - Encouraged patient to follow up with management of her breathing treatment medications through pulmonology.  Obstructive Sleep Apnea - Encouraged patient to follow up with pulmonology/sleep doctor for OSA and to adjust her machine's settings.  Strongly advised patient to begin treating her OSA by wearing her machine every night.  7. Mood Management - Mood remains stable at this time. - Continue treatment plan as prescribed.   - Continue taking Buspar three times daily.  See med list below. - Patient tolerating meds well without complication.  Denies S-E  - Continue to attempt discontinuing use of Xanax.  Reviewed the importance of this at length with patient today.  8. Benign Essential Hypertension - Blood pressure remains controlled at this time. - Continue treatment plan as prescribed.  See med list below. - Patient tolerating meds well without complication.  Denies S-E  - Lifestyle changes such as dash diet and engaging in a regular exercise program discussed with patient.  Educational handouts provided  - Ambulatory BP monitoring encouraged. Keep log and bring in next OV  9. BMI Counseling -  BMI of 35.9 Explained to patient what BMI refers to, and what it means medically.    Told patient to think about it as a "medical risk stratification measurement" and how increasing BMI is associated with increasing risk/ or worsening state of various diseases such as hypertension,  hyperlipidemia, diabetes, premature OA, depression etc.  American Heart Association guidelines for healthy diet, basically Mediterranean diet, and exercise guidelines of 30 minutes 5 days per week or more discussed in detail.  Health counseling performed.  All questions answered.  10. Lifestyle & Preventative Health Maintenance - Advised patient to continue working toward exercising to improve overall mental, physical, and emotional health.    - To improve her health and energy levels, encouraged patient to quit smoking, eat more healthfully, exercise regularly.  - Encouraged patient to engage in daily physical activity, especially a formal exercise routine.  Recommended that the patient eventually strive for at least 150 minutes of moderate cardiovascular activity per week according to guidelines established by the [facility].   - Healthy dietary habits encouraged, including low-carb, and high amounts of lean protein in diet.   - Patient should also consume adequate amounts of water.  11. Smoking Cessation - Told pt to think seriously about quitting smoking!  Told pt it is very important for his/her health and well being.    - Smoking cessation instruction/counseling given:  counseled patient on the dangers of tobacco use, advised patient to stop smoking, and reviewed strategies to maximize success  - Discussed with patient that there are multiple treatments to aid in quitting smoking, however I explained none will work unless pt really want to quit  - Told to call 1-800-QUIT-NOW 956-796-3254) for free smoking cessation counseling and support, or pt can go online to www.heart.org - the American Heart Association website and search "quit smoking ".    Education and routine counseling performed. Handouts provided.  12. Follow-Up - Prescriptions provided and refilled today PRN. - Re-check fasting lab work as recommended. - In 4 months, re-check Vitamin D and A1c. - Otherwise, continue to  return for CPE and chronic follow-up as scheduled.   - Patient knows to call in sooner if desired to address acute concerns.  Pt was in the office today for 32.5+ minutes, with over 50% time spent in face to face counseling of patients various medical conditions, treatment plans of those medical conditions including medicine management and lifestyle modification, strategies to improve health and well being; and in coordination of care. SEE ABOVE TREATMENT PLAN FOR DETAILS    --> Told patient to get her pulmonary medications from her pulmonologist in the future.  Meds ordered this encounter  Medications  . albuterol (PROVENTIL HFA;VENTOLIN HFA) 108 (90 Base) MCG/ACT inhaler    Sig: Inhale 2 puffs into the lungs every 6 (six) hours as needed for wheezing.    Dispense:  1 Inhaler    Refill:  2  . Vitamin D, Ergocalciferol, (DRISDOL) 1.25 MG (50000 UT) CAPS capsule    Sig: Take 1 capsule (50,000 Units total) by mouth every 7 (seven) days.    Dispense:  12 capsule    Refill:  10      Return for (2) 48mo- reck Vit D and A1c 3 d prior.   Anticipatory guidance and routine counseling done re: condition, txmnt options and need for follow up. All questions of patient's were answered.   Gross side effects, risk and benefits, and alternatives of medications discussed with patient.  Patient is aware that  all medications have potential side effects and we are unable to predict every sideeffect or drug-drug interaction that may occur.  Expresses verbal understanding and consents to current therapy plan and treatment regiment.  Please see AVS handed out to patient at the end of our visit for additional patient instructions/ counseling done pertaining to today's office visit.  Note:  This document was prepared using Dragon voice recognition software and may include unintentional dictation errors.  This document serves as a record of services personally performed by Mellody Dance, DO. It was created  on her behalf by [name], a trained medical scribe. The creation of this record is based on the scribe's personal observations and the provider's statements to them.   I have reviewed the above medical documentation for accuracy and completeness and I concur.  Mellody Dance, DO, D.O. 04/30/2018 6:45 PM      ----------------------------------------------------------------------------------------------------------------------   Subjective:   CC:   Tiffany Murphy is a 67 y.o. female who presents to [facility] today for review and discussion of recent bloodwork that was done in addition to f/up on chronic conditions we are managing for pt.  1. All recent blood work that we ordered was reviewed with patient today.  Patient was counseled on all abnormalities and we discussed dietary and lifestyle changes that could help those values (also medications when appropriate).  Extensive health counseling performed and all patient's concerns/ questions were addressed.  See labs below and also plan for more details of these abnormalities  Nodule in Left Lung Notes she forgot to mention a nodule in her left lung.  States "they keep an eye on it, depending on what's going on."  Notes definitely every year, but sometimes every six months.  Primary Care and Pulmonology.  She followed up with Dr. Gardiner Rhyme.  Was diagnosed as COPD.  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 Alcide Clever found it.  Notes "I've never seen anybody for it."  Says "It's a little bitty thing" and "it's just a soft one."  States she has bad headaches, and had an MRI done to find this.  Mood - Managed on Buspar and Xanax Patient started Buspar last appointment. Notes she has been trying to avoid taking Xanax.  Family History of Diabetes Patient's father and all of her aunts were diabetics.  Obstructive Sleep Apnea Patient has  obstructive sleep apnea. Notes she hates the mask and refuses to wear it.  Smoking Cessation Goals Patient wants to quit smoking.  Notes "I'm not making excuses."  States her life at home is miserable between her husband and her granddaughter.  Her granddaughter is 15 and hard to handle.  Feels "it would be really hard" to quit at this time.   Wt Readings from Last 3 Encounters:  04/30/18 196 lb 8 oz (89.1 kg)  04/11/18 189 lb 11.2 oz (86 kg)  01/22/15 183 lb (83 kg)   BP Readings from Last 3 Encounters:  04/30/18 138/76  04/11/18 114/75  01/03/18 (!) 144/60   Pulse Readings from Last 3 Encounters:  04/30/18 69  04/11/18 66  01/03/18 64   BMI Readings from Last 3 Encounters:  04/30/18 35.94 kg/m  04/11/18 34.70 kg/m  01/22/15 33.47 kg/m     Patient Care Team    Relationship Specialty Notifications Start End  Mellody Dance, DO PCP - General Family Medicine  04/11/18   Minus Breeding, MD  Consulting Physician Cardiology  04/11/18   Gardiner Rhyme, MD Referring Physician Pulmonary Disease  04/11/18    Comment: txs her COPD and OSA  Jackquline Denmark, MD Consulting Physician Gastroenterology  04/11/18   Jolene Schimke    04/11/18   Yvone Neu, MD Referring Physician Family Medicine  04/11/18     Full medical history updated and reviewed in the office today  Patient Active Problem List   Diagnosis Date Noted  . Chronic obstructive pulmonary disease ([place]) 04/11/2018    Priority: High  . OSA and COPD overlap syndrome ([place]) 04/30/2018  . Chronic fatigue 04/30/2018  . Lung nodule seen on imaging study 04/30/2018  . Vitamin D deficiency 04/30/2018  . Tobacco use disorder 04/11/2018  . Elevated LDL cholesterol level 04/11/2018  . GERD (gastroesophageal reflux disease) 04/11/2018  . Asthma 04/11/2018  . Obesity, Class I, BMI 30-34.9 04/11/2018  . Glucose intolerance (impaired glucose tolerance) 04/11/2018  . Irritable bowel syndrome 04/11/2018  . Chronic  bilateral upper abdominal pain 04/11/2018  . Panic attacks 04/11/2018  . Mood disorder ([place]) 04/11/2018  . Hypokalemia 04/11/2018  . Environmental and seasonal allergies 04/11/2018  . Elevated coronary artery calcium score 12/01/2014  . Benign essential HTN 07/11/2013  . Depression with anxiety 07/11/2013  . Pure hypercholesterolemia 07/11/2013  . Nicotine addiction 06/15/2013  . Overweight 06/15/2013    Past Medical History:  Diagnosis Date  . Asthma   . COPD (chronic obstructive pulmonary disease) ([place])   . GERD (gastroesophageal reflux disease)   . Hypertension   . Tobacco abuse     Past Surgical History:  Procedure Laterality Date  . APPENDECTOMY    . VAGINAL HYSTERECTOMY      Social History   Tobacco Use  . Smoking status: Current Every Day Smoker    Packs/day: 0.50    Years: 44.00    Pack years: 22.00    Types: Cigarettes  . Smokeless tobacco: Never Used  Substance Use Topics  . Alcohol use: No    Alcohol/week: 0.0 standard drinks    Family Hx: Family History  Problem Relation Age of Onset  . Alzheimer's disease Father 22  . Diabetes Father   . CAD Mother 59     Medications: Current Outpatient Medications  Medication Sig Dispense Refill  . albuterol (PROVENTIL HFA;VENTOLIN HFA) 108 (90 Base) MCG/ACT inhaler Inhale 2 puffs into the lungs every 6 (six) hours as needed for wheezing. 1 Inhaler 2  . ALPRAZolam (XANAX) 0.25 MG tablet 1 tab as needed for PANIC attack only 60 tablet 0  . busPIRone (BUSPAR) 10 MG tablet Take 1 tablet (10 mg total) by mouth 3 (three) times daily. 90 tablet 5  . dicyclomine (BENTYL) 10 MG capsule Take 1 capsule (10 mg total) by mouth 4 (four) times daily. 120 capsule 6  . fluticasone (FLONASE) 50 MCG/ACT nasal spray Place 2 sprays into both nostrils daily.    Marland Kitchen gabapentin (NEURONTIN) 300 MG capsule Take 300 mg by mouth 3 (three) times daily.     Marland Kitchen lisinopril-hydrochlorothiazide (PRINZIDE,ZESTORETIC) 20-25 MG per tablet TAKE ONE  TABLET BY MOUTH ONCE DAILY "OV NEEDED FOR ADDITIONAL VISITS" 30 tablet 0  . montelukast (SINGULAIR) 10 MG tablet Take 1 tablet (10 mg total) by mouth at bedtime. 90 tablet 1  . omeprazole (PRILOSEC) 40 MG capsule Take 1 capsule by mouth daily.    . potassium chloride SA (K-DUR,KLOR-CON) 20 MEQ tablet Take 1 tablet (20 mEq total) by mouth 2 (two) times daily. [place]  tablet 1  . rosuvastatin (CRESTOR) 20 MG tablet Take 1 tablet (20 mg total) by mouth at bedtime. 90 tablet 0  . sertraline (ZOLOFT) 100 MG tablet Take 200 mg by mouth daily.     . SYMBICORT 160-4.5 MCG/ACT inhaler Take 2 puffs by mouth 2 (two) times daily.     . Vitamin D, Ergocalciferol, (DRISDOL) 1.25 MG (50000 UT) CAPS capsule Take 1 capsule (50,000 Units total) by mouth every 7 (seven) days. 12 capsule 10   No current facility-administered medications for this visit.     Allergies:  Allergies  Allergen Reactions  . Penicillins Hives and Shortness Of Breath  . Lovastatin     cramps  . Other     Fragrances in soaps, perfumes, shampoos, conditioners     Review of Systems: General:   No F/C, wt loss Pulm:   No DIB, SOB, pleuritic chest pain Card:  No CP, palpitations Abd:  No n/v/d or pain Ext:  No inc edema from baseline  Objective:  Blood pressure 138/76, pulse 69, temperature 98.4 F (36.9 C), height 5\' 2"  (1.575 m), weight 196 lb 8 oz (89.1 kg), SpO2 98 %. Body mass index is 35.94 kg/m. Gen:   Well NAD, A and O *3 HEENT:    [place]/AT, EOMI,  MMM Lungs:   Normal work of breathing. CTA B/L, no Wh, rhonchi Heart:   RRR, S1, S2 WNL's, no MRG Abd:   No gross distention Exts:    warm, pink,  Brisk capillary refill, warm and well perfused.  Psych:    No HI/SI, judgement and insight good, Euthymic mood. Full Affect.   Recent Results (from the past 2160 hour(s))  VITAMIN D 25 Hydroxy (Vit-D Deficiency, Fractures)     Status: Abnormal   Collection Time: 04/26/18  9:41 AM  Result Value Ref Range   Vit D, 25-Hydroxy 24.1  (L) 30.0 - 100.0 ng/mL    Comment: Vitamin D deficiency has been defined by the [place] practice guideline as a level of serum 25-OH vitamin D less than 20 ng/mL (1,2). The Endocrine Society went on to further define vitamin D insufficiency as a level between 21 and 29 ng/mL (2). 1. IOM (Institute of Medicine). 2010. Dietary reference    intakes for calcium and D. [place]: The    [employer]. 2. Holick MF, Binkley [place], Bischoff-Ferrari HA, et al.    Evaluation, treatment, and prevention of vitamin D    deficiency: an Endocrine Society clinical practice    guideline. JCEM. 2011 Jul; 96(7):1911-30.   TSH     Status: None   Collection Time: 04/26/18  9:41 AM  Result Value Ref Range   TSH 2.980 0.450 - 4.500 uIU/mL  T4, free     Status: None   Collection Time: 04/26/18  9:41 AM  Result Value Ref Range   Free T4 0.89 0.82 - 1.77 ng/dL  Lipid panel     Status: None   Collection Time: 04/26/18  9:41 AM  Result Value Ref Range   Cholesterol, Total 147 100 - 199 mg/dL   Triglycerides 139 0 - 149 mg/dL   HDL 50 >39 mg/dL   VLDL Cholesterol Cal 28 5 - 40 mg/dL   LDL Calculated 69 0 - 99 mg/dL   Chol/HDL Ratio 2.9 0.0 - 4.4 ratio    Comment:  T. Chol/HDL Ratio                                             Men  Women                               1/2 Avg.Risk  3.4    3.3                                   Avg.Risk  5.0    4.4                                2X Avg.Risk  9.6    7.1                                3X Avg.Risk 23.4   11.0   Hemoglobin A1c     Status: Abnormal   Collection Time: 04/26/18  9:41 AM  Result Value Ref Range   Hgb A1c MFr Bld 6.2 (H) 4.8 - 5.6 %    Comment:          Prediabetes: 5.7 - 6.4          Diabetes: >6.4          Glycemic control for adults with diabetes: <7.0    Est. average glucose Bld gHb Est-mCnc 131 mg/dL  Comprehensive metabolic panel     Status: Abnormal    Collection Time: 04/26/18  9:41 AM  Result Value Ref Range   Glucose 125 (H) 65 - 99 mg/dL   BUN 14 8 - 27 mg/dL   Creatinine, Ser 0.96 0.57 - 1.00 mg/dL   GFR calc non Af Amer 61 >59 mL/min/1.73   GFR calc Af Amer 70 >59 mL/min/1.73   BUN/Creatinine Ratio 15 12 - 28   Sodium 142 134 - 144 mmol/L   Potassium 3.9 3.5 - 5.2 mmol/L   Chloride 102 96 - 106 mmol/L   CO2 25 20 - 29 mmol/L   Calcium 9.1 8.7 - 10.3 mg/dL   Total Protein 6.2 6.0 - 8.5 g/dL   Albumin 4.1 3.6 - 4.8 g/dL   Globulin, Total 2.1 1.5 - 4.5 g/dL   Albumin/Globulin Ratio 2.0 1.2 - 2.2   Bilirubin Total 0.4 0.0 - 1.2 mg/dL   Alkaline Phosphatase 71 39 - 117 IU/L   AST 20 0 - 40 IU/L   ALT 21 0 - 32 IU/L  CBC with Differential/Platelet     Status: None   Collection Time: 04/26/18  9:41 AM  Result Value Ref Range   WBC 5.7 3.4 - 10.8 x10E3/uL   RBC 4.65 3.77 - 5.28 x10E6/uL   Hemoglobin 14.0 11.1 - 15.9 g/dL   Hematocrit 41.9 34.0 - 46.6 %   MCV 90 79 - 97 fL   MCH 30.1 26.6 - 33.0 pg   MCHC 33.4 31.5 - 35.7 g/dL   RDW 13.2 12.3 - 15.4 %   Platelets 210 150 - 450 x10E3/uL   Neutrophils 60 Not Estab. %   Lymphs 26 Not Estab. %   Monocytes 9 Not Estab. %   Eos 3  Not Estab. %   Basos 1 Not Estab. %   Neutrophils Absolute 3.5 1.4 - 7.0 x10E3/uL   Lymphocytes Absolute 1.5 0.7 - 3.1 x10E3/uL   Monocytes Absolute 0.5 0.1 - 0.9 x10E3/uL   EOS (ABSOLUTE) 0.2 0.0 - 0.4 x10E3/uL   Basophils Absolute 0.1 0.0 - 0.2 x10E3/uL   Immature Granulocytes 1 Not Estab. %   Immature Grans (Abs) 0.0 0.0 - 0.1 x10E3/uL

## 2018-05-13 ENCOUNTER — Other Ambulatory Visit: Payer: Self-pay | Admitting: Gastroenterology

## 2018-05-15 ENCOUNTER — Other Ambulatory Visit: Payer: Self-pay | Admitting: Gastroenterology

## 2018-05-15 ENCOUNTER — Other Ambulatory Visit: Payer: Self-pay | Admitting: Family Medicine

## 2018-05-15 DIAGNOSIS — I10 Essential (primary) hypertension: Secondary | ICD-10-CM

## 2018-05-15 MED ORDER — LISINOPRIL-HYDROCHLOROTHIAZIDE 20-25 MG PO TABS: ORAL_TABLET | ORAL | 1 refills | Status: DC

## 2018-05-15 NOTE — Telephone Encounter (Signed)
Patient is requesting a refill of her lisinopril-hydrochlorothiazide, this was filled last by prior PCP and was advised by her pharm to contact new PCP for prescription refill. If approved please send to [facility] on [address].

## 2018-05-15 NOTE — Telephone Encounter (Signed)
Patient called requesting a refill on lisinopril- HCTZ, medication was last filled by her pervious provider. LOV 04/13/2018.  Please review and advise. MPulliam, CMA/RT(R)

## 2018-05-15 NOTE — Telephone Encounter (Signed)
Sent request to Dr. Opalski to review. MPulliam, CMA/RT(R)  

## 2018-05-15 NOTE — Addendum Note (Signed)
Addended by: Lanier Prude D on: 05/15/2018 11:35 AM   Modules accepted: Orders

## 2018-05-21 ENCOUNTER — Other Ambulatory Visit: Payer: Self-pay | Admitting: Gastroenterology

## 2018-05-29 ENCOUNTER — Other Ambulatory Visit: Payer: Self-pay | Admitting: Family Medicine

## 2018-05-29 ENCOUNTER — Telehealth: Payer: Self-pay | Admitting: Gastroenterology

## 2018-05-29 MED ORDER — DICYCLOMINE HCL 10 MG PO CAPS
10.0000 mg | ORAL_CAPSULE | Freq: Four times a day (QID) | ORAL | 0 refills | Status: DC
Start: 2018-05-29 — End: 2018-06-25

## 2018-05-29 MED ORDER — OMEPRAZOLE 40 MG PO CPDR: 40.0000 mg | DELAYED_RELEASE_CAPSULE | Freq: Every day | ORAL | 1 refills | Status: DC

## 2018-05-29 MED ORDER — SERTRALINE HCL 100 MG PO TABS: 200.0000 mg | ORAL_TABLET | Freq: Every day | ORAL | 1 refills | Status: DC

## 2018-05-29 NOTE — Telephone Encounter (Signed)
Patient is requesting a refill of her sertraline and omeprazole. If approved please send to [facility] on [address]. Patient is also requesting a 90 day supply if possible, she states that with her insurance it is the same price for a 30 day and 90 day supply, so she would like the 90 day to save money if she can.  Finally the patient has taken 800mg  ibuprofen for arthritis and is wondering if Dr. Jenetta Downer would be ok sending a prescription for that too.

## 2018-05-29 NOTE — Telephone Encounter (Signed)
Sent refill into patients pharmacy.

## 2018-05-29 NOTE — Addendum Note (Signed)
Addended by: Lanier Prude D on: 05/29/2018 04:42 PM   Modules accepted: Orders

## 2018-05-29 NOTE — Telephone Encounter (Signed)
Patient is requesting a refill on Sertraline and Omeprazole, both medications were last filled by previous providers.  Patient is also requesting Ibuprofen 800mg  for arthritis.  LOV 04/30/2018.  Please review and advise. MPulliam, CMA/RT(R)

## 2018-06-14 ENCOUNTER — Other Ambulatory Visit: Payer: Self-pay

## 2018-06-14 ENCOUNTER — Telehealth: Payer: Self-pay | Admitting: Family Medicine

## 2018-06-14 NOTE — Telephone Encounter (Signed)
Last filled by a pervious provider sent request to Dr. Raliegh Scarlet to review. MPulliam, CMA/RT(R)

## 2018-06-14 NOTE — Telephone Encounter (Signed)
Patient requesting a refill on Symbicort.  Medication was last filled by a pervious provider. LOV 04/30/2018.  Please review and advise. MPulliam, CMA/RT(R)

## 2018-06-14 NOTE — Telephone Encounter (Signed)
Patient is requesting a refill for her Symbicort, she states that its roughly the same price between 1 and 3 inhalers, so if possible she would like a few extra to save some money if possible. If approved please send to [facility] on [address].

## 2018-06-14 NOTE — Telephone Encounter (Signed)
-  Sees Dr. Gardenia Phlegm for pulmonology in [place] - Per pt her copd and breathing is really bad and she requires yrly visits or more often with them.    (  Please See first OV where we decided it be best for her to cont with pulm for her COPD txmnt)

## 2018-06-14 NOTE — Telephone Encounter (Signed)
Called patient she states that she is in the process of changing pulmonologist and she is working on setting up a new patient appointment to est with a new office.  Patient is currently out of medication and is not sure how long she will have to wait on an appointment.  Please review and advise. MPulliam, CMA/RT(R)

## 2018-06-15 ENCOUNTER — Other Ambulatory Visit: Payer: Self-pay

## 2018-06-15 DIAGNOSIS — G4733 Obstructive sleep apnea (adult) (pediatric): Secondary | ICD-10-CM

## 2018-06-15 DIAGNOSIS — J449 Chronic obstructive pulmonary disease, unspecified: Secondary | ICD-10-CM

## 2018-06-15 DIAGNOSIS — J45909 Unspecified asthma, uncomplicated: Secondary | ICD-10-CM

## 2018-06-15 MED ORDER — SYMBICORT 160-4.5 MCG/ACT IN AERO: 2.0000 | INHALATION_SPRAY | Freq: Two times a day (BID) | RESPIRATORY_TRACT | 1 refills | Status: DC

## 2018-06-15 NOTE — Telephone Encounter (Signed)
Patient notified. MPulliam, CMA/RT(R)  

## 2018-06-15 NOTE — Telephone Encounter (Signed)
Yes that is fine.  You can refill it for 1 inhaler with 1 refill-  in case it takes her a month or so to get in with pulmonology.   However, please make patient aware that I will not refill it in the future as it is best for her to get this from her specialist and be properly monitored from a pulmonary standpoint.   ---> Please place referral to pulmonary-[place] [facility] for pt - [facility].

## 2018-06-15 NOTE — Telephone Encounter (Signed)
RX sent into pharmacy and referral placed.  Called patient to notify.  Left message for patient to call the office. MPulliam, CMA/RT(R)

## 2018-06-18 ENCOUNTER — Encounter: Payer: Self-pay | Admitting: Gastroenterology

## 2018-06-25 ENCOUNTER — Encounter: Payer: Self-pay | Admitting: Gastroenterology

## 2018-06-25 ENCOUNTER — Ambulatory Visit: Payer: Medicare HMO | Admitting: Gastroenterology

## 2018-06-25 VITALS — BP 134/76 | HR 73 | Ht 62.0 in | Wt 190.4 lb

## 2018-06-25 DIAGNOSIS — K589 Irritable bowel syndrome without diarrhea: Secondary | ICD-10-CM

## 2018-06-25 DIAGNOSIS — Z86010 Personal history of colonic polyps: Secondary | ICD-10-CM

## 2018-06-25 MED ORDER — DICYCLOMINE HCL 10 MG PO CAPS: 10.0000 mg | ORAL_CAPSULE | Freq: Four times a day (QID) | ORAL | 2 refills | Status: DC

## 2018-06-25 NOTE — Patient Instructions (Signed)
If you are age 70 or older, your body mass index should be between 23-30. Your Body mass index is 34.82 kg/m. If this is out of the aforementioned range listed, please consider follow up with your Primary Care Provider.  If you are age 34 or younger, your body mass index should be between 19-25. Your Body mass index is 34.82 kg/m. If this is out of the aformentioned range listed, please consider follow up with your Primary Care Provider.   We have sent the following medications to your pharmacy for you to pick up at your convenience: Bentyl 10 mg   You will be due for a recall colonoscopy in 08/2020. We will send you a reminder in the mail when it gets closer to that time.   Thank you,  Dr. Jackquline Denmark

## 2018-06-25 NOTE — Progress Notes (Signed)
Chief Complaint: FU  Referring Provider:  Mellody Dance, DO      ASSESSMENT AND PLAN;   #1.  IBS with diarhea. Neg colon with TI and random colonic biopsies 08/2015.  Did have polyps.  #2. H/O tubular adenomas (last colon 08/2015). Next due 08/20/2020 unless red flag symptoms.  Plan: - bentyl 10mg  po qid #120, 6 refills - I have instructed patient to stop smoking.  Have discussed risks associated with smoking including risks of various cancers. - FU prn   HPI:    Tiffany Murphy is a 68 y.o. female  For follow-up visit Here to get refill for Bentyl. Doing well on 4/day Occasional diarrhea and abdominal pain but much better. Continues to smoke despite medical advice.  Past GI history: -Colonoscopy 08/26/2015 colonic polyp status post polypectomy (tubular adenomas and hyperplastic polyps), mild sigmoid diverticulosis.  Otherwise normal to TI.  Negative random TI and colonic biopsies.  She had negative CT/PET CT at that time. -EGD 02/2011 by Dr. Paul Half hiatal hernia.  Otherwise normal. Past Medical History:  Diagnosis Date  . Asthma   . COPD (chronic obstructive pulmonary disease) ([place])   . GERD (gastroesophageal reflux disease)   . Hypertension   . Tobacco abuse     Past Surgical History:  Procedure Laterality Date  . APPENDECTOMY    . COLONOSCOPY  08/26/2015   Colonic polyps status post polypectomy. Minimal sigmoid diverticulosis.   Marland Kitchen ESOPHAGOGASTRODUODENOSCOPY  02/25/2011   Large hiatal hernia otherwise normal EGD.  Marland Kitchen VAGINAL HYSTERECTOMY      Family History  Problem Relation Age of Onset  . Alzheimer's disease Father 53  . Diabetes Father   . CAD Mother 107  . Colon cancer Neg Hx   . Esophageal cancer Neg Hx     Social History   Tobacco Use  . Smoking status: Current Every Day Smoker    Packs/day: 0.50    Years: 44.00    Pack years: 22.00    Types: Cigarettes  . Smokeless tobacco: Never Used  Substance Use Topics  . Alcohol use: No   Alcohol/week: 0.0 standard drinks  . Drug use: No    Current Outpatient Medications  Medication Sig Dispense Refill  . albuterol (PROVENTIL HFA;VENTOLIN HFA) 108 (90 Base) MCG/ACT inhaler Inhale 2 puffs into the lungs every 6 (six) hours as needed for wheezing. 1 Inhaler 2  . ALPRAZolam (XANAX) 0.25 MG tablet 1 tab as needed for PANIC attack only 60 tablet 0  . busPIRone (BUSPAR) 10 MG tablet Take 1 tablet (10 mg total) by mouth 3 (three) times daily. 90 tablet 5  . dicyclomine (BENTYL) 10 MG capsule Take 1 capsule (10 mg total) by mouth 4 (four) times daily. 120 capsule 0  . fluticasone (FLONASE) 50 MCG/ACT nasal spray Place 2 sprays into both nostrils daily.    Marland Kitchen gabapentin (NEURONTIN) 300 MG capsule Take 300 mg by mouth 3 (three) times daily.     Marland Kitchen lisinopril-hydrochlorothiazide (PRINZIDE,ZESTORETIC) 20-25 MG tablet TAKE ONE TABLET BY MOUTH ONCE DAILY "OV NEEDED FOR ADDITIONAL VISITS" 90 tablet 1  . montelukast (SINGULAIR) 10 MG tablet Take 1 tablet (10 mg total) by mouth at bedtime. 90 tablet 1  . omeprazole (PRILOSEC) 40 MG capsule Take 1 capsule (40 mg total) by mouth daily. 90 capsule 1  . potassium chloride SA (K-DUR,KLOR-CON) 20 MEQ tablet Take 1 tablet (20 mEq total) by mouth 2 (two) times daily. 60 tablet 1  . rosuvastatin (CRESTOR) 20 MG tablet Take 1  tablet (20 mg total) by mouth at bedtime. 90 tablet 0  . sertraline (ZOLOFT) 100 MG tablet Take 2 tablets (200 mg total) by mouth daily. 90 tablet 1  . SYMBICORT 160-4.5 MCG/ACT inhaler Inhale 2 puffs into the lungs 2 (two) times daily. 1 Inhaler 1  . Vitamin D, Ergocalciferol, (DRISDOL) 1.25 MG (50000 UT) CAPS capsule Take 1 capsule (50,000 Units total) by mouth every 7 (seven) days. 12 capsule 10   No current facility-administered medications for this visit.     Allergies  Allergen Reactions  . Penicillins Hives and Shortness Of Breath  . Lovastatin     Cramps in legs and feet  . Other     Fragrances in soaps, perfumes,  shampoos, conditioners    Review of Systems:  Constitutional: Denies fever, chills, diaphoresis, appetite change and fatigue.  HEENT: Denies photophobia, eye pain, redness, hearing loss, ear pain, congestion, sore throat, rhinorrhea, sneezing, mouth sores, neck pain, neck stiffness and tinnitus.   Respiratory: Denies SOB, DOE, cough, chest tightness,  and wheezing.   Cardiovascular: Denies chest pain, palpitations and leg swelling.  Genitourinary: Denies dysuria, urgency, frequency, hematuria, flank pain and difficulty urinating.  Musculoskeletal: Denies myalgias, back pain, joint swelling, arthralgias and gait problem.  Skin: No rash.  Neurological: Denies dizziness, seizures, syncope, weakness, light-headedness, numbness and headaches.  Hematological: Denies adenopathy. Easy bruising, personal or family bleeding history  Psychiatric/Behavioral: No anxiety or depression     Physical Exam:    BP 134/76   Pulse 73   Ht 5\' 2"  (1.575 m)   Wt 190 lb 6 oz (86.4 kg)   BMI 34.82 kg/m  Filed Weights   06/25/18 1059  Weight: 190 lb 6 oz (86.4 kg)   Constitutional:  Well-developed, in no acute distress. Psychiatric: Normal mood and affect. Behavior is normal. HEENT: Pupils normal.  Conjunctivae are normal. No scleral icterus. Neck supple.  Cardiovascular: Normal rate, regular rhythm. No edema Pulmonary/chest: B/L decreased BS Abdominal: Soft, nondistended. Nontender. Bowel sounds active throughout. There are no masses palpable. No hepatomegaly. Rectal:  defered Neurological: Alert and oriented to person place and time. Skin: Skin is warm and dry. No rashes noted.  Data Reviewed: I have personally reviewed following labs and imaging studies  CBC: CBC Latest Ref Rng & Units 04/26/2018 01/03/2018 01/22/2014  WBC 3.4 - 10.8 x10E3/uL 5.7 7.5 5.2  Hemoglobin 11.1 - 15.9 g/dL 14.0 13.9 10.1(L)  Hematocrit 34.0 - 46.6 % 41.9 43.2 32.6(L)  Platelets 150 - 450 x10E3/uL 210 209 350     CMP: CMP Latest Ref Rng & Units 04/26/2018 01/03/2018 01/22/2014  Glucose 65 - 99 mg/dL 125(H) 152(H) 100(H)  BUN 8 - 27 mg/dL 14 13 24(H)  Creatinine 0.57 - 1.00 mg/dL 0.96 1.01(H) 1.33(H)  Sodium 134 - 144 mmol/L 142 143 141  Potassium 3.5 - 5.2 mmol/L 3.9 4.3 4.2  Chloride 96 - 106 mmol/L 102 107 106  CO2 20 - 29 mmol/L 25 26 26   Calcium 8.7 - 10.3 mg/dL 9.1 9.4 9.1  Total Protein 6.0 - 8.5 g/dL 6.2 - -  Total Bilirubin 0.0 - 1.2 mg/dL 0.4 - -  Alkaline Phos 39 - 117 IU/L 71 - -  AST 0 - 40 IU/L 20 - -  ALT 0 - 32 IU/L 21 - -  I spent 15 minutes of face-to-face time with the patient. Greater than 50% of the time was spent counseling and coordinating care.     Carmell Austria, MD 06/25/2018, 11:27 AM  Cc: Mellody Dance, DO

## 2018-07-02 DIAGNOSIS — L02212 Cutaneous abscess of back [any part, except buttock]: Secondary | ICD-10-CM | POA: Diagnosis not present

## 2018-07-02 DIAGNOSIS — L578 Other skin changes due to chronic exposure to nonionizing radiation: Secondary | ICD-10-CM | POA: Diagnosis not present

## 2018-07-02 DIAGNOSIS — L0390 Cellulitis, unspecified: Secondary | ICD-10-CM | POA: Diagnosis not present

## 2018-07-03 ENCOUNTER — Other Ambulatory Visit: Payer: Self-pay | Admitting: Family Medicine

## 2018-07-03 DIAGNOSIS — E876 Hypokalemia: Secondary | ICD-10-CM

## 2018-07-03 NOTE — Telephone Encounter (Signed)
Patient is requesting a refill of her gabapentin and potassium, she was advised to all PCP office by her pharmacy. She is also requesting a 90 day supply, she states with her coverage that 90 day and 30 day is the same price so she would like to save money with the 3 mnth supply. If approved please send to [facility] on [address].

## 2018-07-03 NOTE — Telephone Encounter (Signed)
Patient requesting refill on Potassium, reviewed chart and patient is not due for refill should still have a RF through the pharmacy patient notified. Patient also requesting refill on gabepentin, medication was last filled by a pervious provider.  LOV 04/30/2018. Please review and advise. MPulliam, CMA/RT(R)

## 2018-07-03 NOTE — Telephone Encounter (Signed)
I do not know why she is on the gabapentin.  I looked back at my last 2 notes on her-the office visit notes and I never discussed use of Neurontin and what it was for with her.  -Energy given medicine I need to know what it is for, how long she has been on it.  Whether or not she is having any side effects.  Whether or not it is even effective etc.  I recommend she make a follow-up office visit to discuss this.

## 2018-07-03 NOTE — Addendum Note (Signed)
Addended by: Lanier Prude D on: 07/03/2018 05:19 PM   Modules accepted: Orders

## 2018-07-04 ENCOUNTER — Telehealth: Payer: Self-pay | Admitting: Family Medicine

## 2018-07-04 NOTE — Telephone Encounter (Signed)
Spoke to patient appointment made for 07/09/18. MPulliam, CMA/RT(R)

## 2018-07-04 NOTE — Telephone Encounter (Signed)
Appointment made for the patient on 07/09/18. MPulliam, CMA/RT(R)

## 2018-07-04 NOTE — Telephone Encounter (Signed)
Patient called to request Rx refill on :   gabapentin (NEURONTIN) 300 MG capsule [[id number]]   Order Details  Dose: 300 mg Route: Oral Frequency: 3 times daily  Dispense Quantity: -- Refills: -- Fills remaining: --        Sig: Take 300 mg by mouth 3 (three) times daily.        Written Date: -- Expiration Date: -- Ordering Date: 12/01/14   Start Date: 11/12/14 End Date: --         Ordering Provider: -- DEA #:  -- NPI:  --   Authorizing Provider: [provider] DEA #:  -- NPI:  [id number]   Ordering User:  Vennie Homans                       --Forwarding request to medical assistant that if approved to send short (30) supply to :  [facility] ([address]), [place] [phone] (Phone) [phone] (Fax)   ------Patient is completely out & usually get a 90day supply @ [facility] but needs some soon as possible .  --glh

## 2018-07-06 ENCOUNTER — Ambulatory Visit (INDEPENDENT_AMBULATORY_CARE_PROVIDER_SITE_OTHER): Payer: Medicare HMO | Admitting: Pulmonary Disease

## 2018-07-06 ENCOUNTER — Encounter: Payer: Self-pay | Admitting: Pulmonary Disease

## 2018-07-06 VITALS — BP 106/68 | HR 64 | Ht 62.0 in | Wt 191.0 lb

## 2018-07-06 DIAGNOSIS — Z8709 Personal history of other diseases of the respiratory system: Secondary | ICD-10-CM | POA: Diagnosis not present

## 2018-07-06 DIAGNOSIS — Z720 Tobacco use: Secondary | ICD-10-CM

## 2018-07-06 DIAGNOSIS — G4733 Obstructive sleep apnea (adult) (pediatric): Secondary | ICD-10-CM | POA: Diagnosis not present

## 2018-07-06 MED ORDER — FLUTICASONE-UMECLIDIN-VILANT 100-62.5-25 MCG/INH IN AEPB: 1.0000 | INHALATION_SPRAY | Freq: Every day | RESPIRATORY_TRACT | 0 refills | Status: DC

## 2018-07-06 MED ORDER — FLUTICASONE-UMECLIDIN-VILANT 100-62.5-25 MCG/INH IN AEPB: 1.0000 | INHALATION_SPRAY | Freq: Every day | RESPIRATORY_TRACT | 6 refills | Status: DC

## 2018-07-06 NOTE — Patient Instructions (Addendum)
Chronic obstructive pulmonary disease History of obstructive sleep apnea  Obtain a home sleep study Breathing study CT scan of the chest without contrast for lung nodule We will change your inhaler to Trelegy from Symbicort  Continue to work on your smoking cessation efforts  I will see you back in the office in 3 months   Sleep Apnea Sleep apnea is a condition in which breathing pauses or becomes shallow during sleep. Episodes of sleep apnea usually last 10 seconds or longer, and they may occur as many as 20 times an hour. Sleep apnea disrupts your sleep and keeps your body from getting the rest that it needs. This condition can increase your risk of certain health problems, including:  Heart attack.  Stroke.  Obesity.  Diabetes.  Heart failure.  Irregular heartbeat. There are three kinds of sleep apnea:  Obstructive sleep apnea. This kind is caused by a blocked or collapsed airway.  Central sleep apnea. This kind happens when the part of the brain that controls breathing does not send the correct signals to the muscles that control breathing.  Mixed sleep apnea. This is a combination of obstructive and central sleep apnea. What are the causes? The most common cause of this condition is a collapsed or blocked airway. An airway can collapse or become blocked if:  Your throat muscles are abnormally relaxed.  Your tongue and tonsils are larger than normal.  You are overweight.  Your airway is smaller than normal. What increases the risk? This condition is more likely to develop in people who:  Are overweight.  Smoke.  Have a smaller than normal airway.  Are elderly.  Are female.  Drink alcohol.  Take sedatives or tranquilizers.  Have a family history of sleep apnea. What are the signs or symptoms? Symptoms of this condition include:  Trouble staying asleep.  Daytime sleepiness and tiredness.  Irritability.  Loud snoring.  Morning  headaches.  Trouble concentrating.  Forgetfulness.  Decreased interest in sex.  Unexplained sleepiness.  Mood swings.  Personality changes.  Feelings of depression.  Waking up often during the night to urinate.  Dry mouth.  Sore throat. How is this diagnosed? This condition may be diagnosed with:  A medical history.  A physical exam.  A series of tests that are done while you are sleeping (sleep study). These tests are usually done in a sleep lab, but they may also be done at home. How is this treated? Treatment for this condition aims to restore normal breathing and to ease symptoms during sleep. It may involve managing health issues that can affect breathing, such as high blood pressure or obesity. Treatment may include:  Sleeping on your side.  Using a decongestant if you have nasal congestion.  Avoiding the use of depressants, including alcohol, sedatives, and narcotics.  Losing weight if you are overweight.  Making changes to your diet.  Quitting smoking.  Using a device to open your airway while you sleep, such as: ? An oral appliance. This is a custom-made mouthpiece that shifts your lower jaw forward. ? A continuous positive airway pressure (CPAP) device. This device delivers oxygen to your airway through a mask. ? A nasal expiratory positive airway pressure (EPAP) device. This device has valves that you put into each nostril. ? A bi-level positive airway pressure (BPAP) device. This device delivers oxygen to your airway through a mask.  Surgery if other treatments do not work. During surgery, excess tissue is removed to create a wider airway. It  is important to get treatment for sleep apnea. Without treatment, this condition can lead to:  High blood pressure.  Coronary artery disease.  (Men) An inability to achieve or maintain an erection (impotence).  Reduced thinking abilities. Follow these instructions at home:  Make any lifestyle changes that  your health care provider recommends.  Eat a healthy, well-balanced diet.  Take over-the-counter and prescription medicines only as told by your health care provider.  Avoid using depressants, including alcohol, sedatives, and narcotics.  Take steps to lose weight if you are overweight.  If you were given a device to open your airway while you sleep, use it only as told by your health care provider.  Do not use any tobacco products, such as cigarettes, chewing tobacco, and e-cigarettes. If you need help quitting, ask your health care provider.  Keep all follow-up visits as told by your health care provider. This is important. Contact a health care provider if:  The device that you received to open your airway during sleep is uncomfortable or does not seem to be working.  Your symptoms do not improve.  Your symptoms get worse. Get help right away if:  You develop chest pain.  You develop shortness of breath.  You develop discomfort in your back, arms, or stomach.  You have trouble speaking.  You have weakness on one side of your body.  You have drooping in your face. These symptoms may represent a serious problem that is an emergency. Do not wait to see if the symptoms will go away. Get medical help right away. Call your local emergency services (911 in the [place] Do not drive yourself to the hospital. This information is not intended to replace advice given to you by your health care provider. Make sure you discuss any questions you have with your health care provider. Document Released: 05/27/2002 Document Revised: 01/02/2017 Document Reviewed: 03/16/2015 Elsevier Interactive Patient Education  2019 [employer].

## 2018-07-06 NOTE — Progress Notes (Signed)
Tiffany Murphy    [id number]    23-Jan-1951  Primary Care Physician:Opalski, Neoma Laming, DO  Referring Physician: Mellody Dance, [place] [address] [place], [place] [address]  Chief complaint:   Patient with a history of obstructive lung disease In for evaluation for COPD  HPI:  Has a history of COPD History of obstructive sleep apnea Did use CPAP regularly in the past-has not in the last year-she did try to use it again recently and felt the pressure was not right--she has not been able to tolerate it  She has an active smoker, smokes about half a pack a day, at the area she was smoking 2 packs of cigarettes a day  She has a history of hypertension, mood disorder, panic attacks Hyperlipidemia Lung nodule noted in the past  History of chronic fatigue  She was on Symbicort-feels it may not be helping as well as it did in the past  Outpatient Encounter Medications as of 07/06/2018  Medication Sig  . albuterol (PROVENTIL HFA;VENTOLIN HFA) 108 (90 Base) MCG/ACT inhaler Inhale 2 puffs into the lungs every 6 (six) hours as needed for wheezing.  Marland Kitchen ALPRAZolam (XANAX) 0.25 MG tablet 1 tab as needed for PANIC attack only  . busPIRone (BUSPAR) 10 MG tablet Take 1 tablet (10 mg total) by mouth 3 (three) times daily.  Marland Kitchen dicyclomine (BENTYL) 10 MG capsule Take 1 capsule (10 mg total) by mouth 4 (four) times daily.  . fluticasone (FLONASE) 50 MCG/ACT nasal spray Place 2 sprays into both nostrils daily.  Marland Kitchen gabapentin (NEURONTIN) 300 MG capsule Take 300 mg by mouth 3 (three) times daily.   Marland Kitchen lisinopril-hydrochlorothiazide (PRINZIDE,ZESTORETIC) 20-25 MG tablet TAKE ONE TABLET BY MOUTH ONCE DAILY "OV NEEDED FOR ADDITIONAL VISITS"  . montelukast (SINGULAIR) 10 MG tablet Take 1 tablet (10 mg total) by mouth at bedtime.  Marland Kitchen omeprazole (PRILOSEC) 40 MG capsule Take 1 capsule (40 mg total) by mouth daily.  . potassium chloride SA (K-DUR,KLOR-CON) 20 MEQ tablet TAKE 1 TABLET BY MOUTH TWICE  DAILY  . rosuvastatin (CRESTOR) 20 MG tablet Take 1 tablet (20 mg total) by mouth at bedtime.  . sertraline (ZOLOFT) 100 MG tablet Take 2 tablets (200 mg total) by mouth daily.  . SYMBICORT 160-4.5 MCG/ACT inhaler Inhale 2 puffs into the lungs 2 (two) times daily.  . Vitamin D, Ergocalciferol, (DRISDOL) 1.25 MG (50000 UT) CAPS capsule Take 1 capsule (50,000 Units total) by mouth every 7 (seven) days.  . Fluticasone-Umeclidin-Vilant (TRELEGY ELLIPTA) 100-62.5-25 MCG/INH AEPB Inhale 1 puff into the lungs daily.  . Fluticasone-Umeclidin-Vilant (TRELEGY ELLIPTA) 100-62.5-25 MCG/INH AEPB Inhale 1 puff into the lungs daily.  . mupirocin ointment (BACTROBAN) 2 % APPLY OINTMENT EXTERNALLY TO AFFECTED AREA TWICE DAILY FOR 10 DAYS THEN THE FIRST 5 DAYS OF THE MONTH FOR 3 MONTHS APPLY TO NOSTRILS UNDER B   No facility-administered encounter medications on file as of 07/06/2018.     Allergies as of 07/06/2018 - Review Complete 07/06/2018  Allergen Reaction Noted  . Penicillins Hives and Shortness Of Breath 08/05/2012  . Lovastatin  07/11/2013  . Other  07/11/2013    Past Medical History:  Diagnosis Date  . Asthma   . COPD (chronic obstructive pulmonary disease) ([place])   . GERD (gastroesophageal reflux disease)   . Hypertension   . Tobacco abuse     Past Surgical History:  Procedure Laterality Date  . APPENDECTOMY    . COLONOSCOPY  08/26/2015   Colonic polyps  status post polypectomy. Minimal sigmoid diverticulosis.   Marland Kitchen ESOPHAGOGASTRODUODENOSCOPY  02/25/2011   Large hiatal hernia otherwise normal EGD.  Marland Kitchen VAGINAL HYSTERECTOMY      Family History  Problem Relation Age of Onset  . Alzheimer's disease Father 68  . Diabetes Father   . CAD Mother 26  . Colon cancer Neg Hx   . Esophageal cancer Neg Hx     Social History   Socioeconomic History  . Marital status: Married    Spouse name: Not on file  . Number of children: 2  . Years of education: Not on file  . Highest education level:  Not on file  Occupational History  . Not on file  Social Needs  . Financial resource strain: Not on file  . Food insecurity:    Worry: Not on file    Inability: Not on file  . Transportation needs:    Medical: Not on file    Non-medical: Not on file  Tobacco Use  . Smoking status: Current Every Day Smoker    Packs/day: 0.50    Years: 44.00    Pack years: 22.00    Types: Cigarettes  . Smokeless tobacco: Never Used  Substance and Sexual Activity  . Alcohol use: No    Alcohol/week: 0.0 standard drinks  . Drug use: No  . Sexual activity: Yes    Birth control/protection: Condom, None  Lifestyle  . Physical activity:    Days per week: Not on file    Minutes per session: Not on file  . Stress: Not on file  Relationships  . Social connections:    Talks on phone: Not on file    Gets together: Not on file    Attends religious service: Not on file    Active member of club or organization: Not on file    Attends meetings of clubs or organizations: Not on file    Relationship status: Not on file  . Intimate partner violence:    Fear of current or ex partner: Not on file    Emotionally abused: Not on file    Physically abused: Not on file    Forced sexual activity: Not on file  Other Topics Concern  . Not on file  Social History Narrative   Works at [employer].     Review of Systems  Constitutional: Positive for fatigue.  HENT: Negative.   Eyes: Negative.   Respiratory: Positive for shortness of breath.   Cardiovascular: Negative.   Gastrointestinal: Negative.   Endocrine: Negative.   Genitourinary: Negative.   All other systems reviewed and are negative.   Vitals:   07/06/18 1028  BP: 106/68  Pulse: 64  SpO2: 95%   Physical Exam  Constitutional: She is oriented to person, place, and time. She appears well-developed and well-nourished.  HENT:  Head: Normocephalic and atraumatic.  Mallampati 3  Eyes: Pupils are equal, round, and reactive to light.  Conjunctivae are normal. Right eye exhibits no discharge. Left eye exhibits no discharge.  Neck: Normal range of motion. Neck supple. No tracheal deviation present. No thyromegaly present.  Cardiovascular: Normal rate and regular rhythm.  Pulmonary/Chest: Effort normal. No respiratory distress. She has no wheezes.  Abdominal: Soft. Bowel sounds are normal. She exhibits no distension. There is no abdominal tenderness. There is no rebound.  Musculoskeletal: Normal range of motion.        General: No edema.  Neurological: She is alert and oriented to person, place, and time. No cranial nerve  deficit.  Skin: Skin is warm and dry.  Psychiatric: She has a normal mood and affect.   Data Reviewed: Report of a previous CT scan noted in record Sleep study results not available  Assessment:  .  Obstructive lung disease -Very significant smoking history -Did have breathing study in the past but not within the last couple years -Has been using Symbicort-does not feel it is working as well .  History of abnormal CT scan of the chest -CT report from 2016 does show some nodularity, some mucous plugging -Significant smoking history .  History of obstructive sleep apnea -Used CPAP well in the past but recently has not used it -When she tried to use her CPAP again-feels like the pressure is not appropriate, not tolerable .  Shortness of breath -Related to history of COPD .  Active smoker -Has quit for over 3 months in the past with different interventions -She is actively thinking about quitting -She recently cut back from 2 packs a day to half a pack a day  Plan/Recommendations:  Repeat CT scan of the chest  Pulmonary function study  Switch from Symbicort to [facility]  Home sleep study  Sherrilyn Rist MD [facility] 07/06/2018, 11:06 AM  CC: Mellody Dance, DO

## 2018-07-09 ENCOUNTER — Ambulatory Visit: Payer: Medicare HMO | Admitting: Family Medicine

## 2018-07-10 ENCOUNTER — Ambulatory Visit: Payer: Medicare HMO | Admitting: Family Medicine

## 2018-07-10 DIAGNOSIS — H524 Presbyopia: Secondary | ICD-10-CM | POA: Diagnosis not present

## 2018-07-10 DIAGNOSIS — Z0100 Encounter for examination of eyes and vision without abnormal findings: Secondary | ICD-10-CM | POA: Diagnosis not present

## 2018-07-10 DIAGNOSIS — E119 Type 2 diabetes mellitus without complications: Secondary | ICD-10-CM | POA: Diagnosis not present

## 2018-07-10 LAB — HM DIABETES EYE EXAM

## 2018-07-11 DIAGNOSIS — G4733 Obstructive sleep apnea (adult) (pediatric): Secondary | ICD-10-CM

## 2018-07-12 ENCOUNTER — Other Ambulatory Visit: Payer: Self-pay | Admitting: Pulmonary Disease

## 2018-07-12 ENCOUNTER — Ambulatory Visit (INDEPENDENT_AMBULATORY_CARE_PROVIDER_SITE_OTHER): Payer: Medicare HMO | Admitting: Family Medicine

## 2018-07-12 ENCOUNTER — Encounter: Payer: Self-pay | Admitting: Family Medicine

## 2018-07-12 VITALS — BP 137/79 | HR 74 | Temp 98.0°F | Ht 62.0 in | Wt 190.0 lb

## 2018-07-12 DIAGNOSIS — G479 Sleep disorder, unspecified: Secondary | ICD-10-CM | POA: Insufficient documentation

## 2018-07-12 DIAGNOSIS — R5382 Chronic fatigue, unspecified: Secondary | ICD-10-CM | POA: Diagnosis not present

## 2018-07-12 DIAGNOSIS — G8929 Other chronic pain: Secondary | ICD-10-CM | POA: Insufficient documentation

## 2018-07-12 DIAGNOSIS — F17219 Nicotine dependence, cigarettes, with unspecified nicotine-induced disorders: Secondary | ICD-10-CM | POA: Diagnosis not present

## 2018-07-12 DIAGNOSIS — Z716 Tobacco abuse counseling: Secondary | ICD-10-CM | POA: Diagnosis not present

## 2018-07-12 DIAGNOSIS — M2550 Pain in unspecified joint: Secondary | ICD-10-CM

## 2018-07-12 DIAGNOSIS — G894 Chronic pain syndrome: Secondary | ICD-10-CM | POA: Insufficient documentation

## 2018-07-12 DIAGNOSIS — E669 Obesity, unspecified: Secondary | ICD-10-CM

## 2018-07-12 DIAGNOSIS — F410 Panic disorder [episodic paroxysmal anxiety] without agoraphobia: Secondary | ICD-10-CM

## 2018-07-12 DIAGNOSIS — F39 Unspecified mood [affective] disorder: Secondary | ICD-10-CM | POA: Diagnosis not present

## 2018-07-12 DIAGNOSIS — Z23 Encounter for immunization: Secondary | ICD-10-CM

## 2018-07-12 MED ORDER — BUPROPION HCL ER (SR) 150 MG PO TB12: ORAL_TABLET | ORAL | 2 refills | Status: DC

## 2018-07-12 MED ORDER — GABAPENTIN 300 MG PO CAPS: ORAL_CAPSULE | ORAL | 1 refills | Status: DC

## 2018-07-12 NOTE — Progress Notes (Addendum)
Impression and Recommendations:    1. Chronic pain disorder   2. Chronic joint pain   3. Obesity, Class I, BMI 30-34.9   4. Mood disorder ([place])   5. Panic attacks   6. Cigarette nicotine dependence with nicotine-induced disorder   7. Tobacco abuse counseling   8. Chronic fatigue   9. Sleep difficulties- poor sleep hygeine   10. Flu vaccine need     - Followed by Dr. Lyndel Safe of GI, Dr. Ander Slade of pulmonology, and Dr. Michele Mcalpine for dermatology.  Discussed need for patient to continue to obtain management and screenings with all established specialists.  Educated patient at length about the critical importance of keeping health maintenance up to date.  Pt was interviewed and evaluated by me in the clinic today for 32.5+ minutes, with over 50% time spent in face to face counseling of patients various medical conditions, treatment plans of those medical conditions including medicine management and lifestyle modification, strategies to improve health and well being; and in coordination of care. SEE ABOVE TREATMENT PLAN FOR DETAILS  - Participated in lengthy conversation and all questions were answered.  - Advised patient to return for Medicare Wellness visit in near future.  - Extensively reviewed med list and refills with patient in office today.  1. Chronic Pains (Joint, Back, Generalized Abdominal, Carpal Tunnel) - Per patient, managed well since 2016 on Gabapentin. - Gabapentin refilled today.  - Extensive education provided today on chronic pain management.  - Reviewed that adequate hydration is also critical for pain management.  2. Sleep Difficulties - Patient may take 600 Gabapentin at night to help alleviate nighttime cramps/restless leg concerns. - Advised patient to restore a good sleep/wake cycle.  - Reviewed the "spokes of the wheel" of mood and health management.  Stressed the importance of ongoing prudent habits, including regular exercise, appropriate sleep hygiene,  healthful dietary habits, and prayer/meditation to relax.  - Handouts provided.  3. Current Smoker - Tobacco Abuse & Cessation Counseling Told pt to think seriously about quitting smoking!  Told pt it is very important for his/her health and well being.    Smoking cessation instruction/counseling given:  counseled patient on the dangers of tobacco use, advised patient to stop smoking, and reviewed strategies to maximize success  Discussed with patient that there are multiple treatments to aid in quitting smoking, however I explained none will work unless pt really want to quit  Told to call 1-800-QUIT-NOW (920)430-3045) for free smoking cessation counseling and support, or pt can go online to www.heart.org - the American Heart Association website and search "quit smoking ".   4. Lifestyle & Preventative Health Maintenance - Advised patient to continue working toward exercising to improve overall mental, physical, and emotional health.    - Extensive health counseling performed.  All questions answered.  - Encouraged patient to engage in daily physical activity, especially a formal exercise routine.  Recommended that the patient eventually strive for at least 150 minutes of moderate cardiovascular activity per week according to guidelines established by the [facility].   - Healthy dietary habits encouraged, including low-carb, and high amounts of lean protein in diet.   - Patient should also consume adequate amounts of water.   Orders Placed This Encounter  Procedures  . Flu vaccine HIGH DOSE PF (Fluzone High dose)    Meds ordered this encounter  Medications  . gabapentin (NEURONTIN) 300 MG capsule    Sig: 1 tab in am, 1 in afternoon and 2  po q hs    Dispense:  400 capsule    Refill:  1    Medications Discontinued During This Encounter  Medication Reason  . Fluticasone-Umeclidin-Vilant (TRELEGY ELLIPTA) 100-62.5-25 MCG/INH AEPB Duplicate  . SYMBICORT 160-4.5 MCG/ACT inhaler Change  in therapy  . gabapentin (NEURONTIN) 300 MG capsule Reorder     Gross side effects, risk and benefits, and alternatives of medications and treatment plan in general discussed with patient.  Patient is aware that all medications have potential side effects and we are unable to predict every side effect or drug-drug interaction that may occur.   Patient will call with any questions prior to using medication if they have concerns.    Expresses verbal understanding and consents to current therapy and treatment regimen.  No barriers to understanding were identified.  Red flag symptoms and signs discussed in detail.  Patient expressed understanding regarding what to do in case of emergency\urgent symptoms  Please see AVS handed out to patient at the end of our visit for further patient instructions/ counseling done pertaining to today's office visit.   Return for Medicare wellness visit 1-2 mo.     Note:  This note was prepared with assistance of Dragon voice recognition software. Occasional wrong-word or sound-a-like substitutions may have occurred due to the inherent limitations of voice recognition software.   This document serves as a record of services personally performed by Mellody Dance, DO. It was created on her behalf by [name], a trained medical scribe. The creation of this record is based on the scribe's personal observations and the provider's statements to them.   I have reviewed the above medical documentation for accuracy and completeness and I concur.  Mellody Dance, DO 07/15/2018 9:14 PM       --------------------------------------------------------------------------------------------------------------------------------------------------------------------------------------------------------------------------------------------    Subjective:     HPI: Tiffany Murphy is a 68 y.o. female who presents to [facility] today for issues  as discussed below.  Chronic Pain - Managed by Neurontin Per patient, has been managed on gabapentin since 2016.  States that she's taken it long ago in the past, as early as age 47, for pain management.  States "I have so many pains, and a lot of it I don't know where it's coming from."  Takes 300 three times per day.  Denies S-E.  "It made me loopy to start with, but I'm used to it now, and it doesn't do it anymore."  Has back pains, joint pains, generalized abdominal pains, and indicates a pain that comes up under her breasts.  Per patient, believes she began taking the gabapentin for her carpal tunnel and elbow pains.  States she's been checked for these pains several times, "checked and checked and checked," and told several explanations.  Notes she has also had shingles, including a current outbreak.  Sleep Habits Has trouble going to sleep and often gets to bed late, then gets up with her husband at 5 AM and drinks coffee.  Often has cramps, restless legs, and cramps in her toes.  GI Concerns Continues to follow up with Dr. Lyndel Safe of GI in [place], who renewed her dicyclomine for her IBS.  Smoking Cessation Patient has made up her mind to totally quit smoking.  Has the support of her pulmonary doctor with this.  Has been to pulmonology, GI, and Jennette Dubin of Dermatology in [place].  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   Wt Readings from Last 3 Encounters:  07/12/18  190 lb (86.2 kg)  07/06/18 191 lb (86.6 kg)  06/25/18 190 lb 6 oz (86.4 kg)   BP Readings from Last 3 Encounters:  07/12/18 137/79  07/06/18 106/68  06/25/18 134/76   Pulse Readings from Last 3 Encounters:  07/12/18 74  07/06/18 64  06/25/18 73   BMI Readings from Last 3 Encounters:  07/12/18 34.75 kg/m  07/06/18 34.93 kg/m  06/25/18 34.82 kg/m     Patient Care Team    Relationship Specialty Notifications Start End  Mellody Dance, DO PCP - General Family Medicine  04/11/18   Minus Breeding,  MD Consulting Physician Cardiology  04/11/18   Jackquline Denmark, MD Consulting Physician Gastroenterology  04/11/18   Jolene Schimke    04/11/18   Yvone Neu, MD Referring Physician Family Medicine  04/11/18   Laurin Coder, MD Consulting Physician Pulmonary Disease  07/12/18      Patient Active Problem List   Diagnosis Date Noted  . Chronic obstructive pulmonary disease ([place]) 04/11/2018    Priority: High  . Tobacco abuse counseling 07/12/2018  . Sleep difficulties- poor sleep hygeine 07/12/2018  . Chronic pain disorder 07/12/2018  . Chronic joint pain 07/12/2018  . OSA and COPD overlap syndrome ([place]) 04/30/2018  . Chronic fatigue 04/30/2018  . Lung nodule seen on imaging study 04/30/2018  . Vitamin D deficiency 04/30/2018  . Tobacco use disorder 04/11/2018  . Elevated LDL cholesterol level 04/11/2018  . GERD (gastroesophageal reflux disease) 04/11/2018  . Asthma 04/11/2018  . Obesity, Class I, BMI 30-34.9 04/11/2018  . Glucose intolerance (impaired glucose tolerance) 04/11/2018  . Irritable bowel syndrome 04/11/2018  . Chronic bilateral upper abdominal pain 04/11/2018  . Panic attacks 04/11/2018  . Mood disorder ([place]) 04/11/2018  . Hypokalemia 04/11/2018  . Environmental and seasonal allergies 04/11/2018  . Elevated coronary artery calcium score 12/01/2014  . Benign essential HTN 07/11/2013  . Depression with anxiety 07/11/2013  . Pure hypercholesterolemia 07/11/2013  . Nicotine addiction 06/15/2013  . Overweight 06/15/2013    Past Medical history, Surgical history, Family history, Social history, Allergies and Medications have been entered into the medical record, reviewed and changed as needed.    Current Meds  Medication Sig  . albuterol (PROVENTIL HFA;VENTOLIN HFA) 108 (90 Base) MCG/ACT inhaler Inhale 2 puffs into the lungs every 6 (six) hours as needed for wheezing.  Marland Kitchen ALPRAZolam (XANAX) 0.25 MG tablet 1 tab as needed for PANIC attack only  . busPIRone  (BUSPAR) 10 MG tablet Take 1 tablet (10 mg total) by mouth 3 (three) times daily.  Marland Kitchen dicyclomine (BENTYL) 10 MG capsule Take 1 capsule (10 mg total) by mouth 4 (four) times daily.  . fluticasone (FLONASE) 50 MCG/ACT nasal spray Place 2 sprays into both nostrils daily.  . Fluticasone-Umeclidin-Vilant (TRELEGY ELLIPTA) 100-62.5-25 MCG/INH AEPB Inhale 1 puff into the lungs daily.  Marland Kitchen gabapentin (NEURONTIN) 300 MG capsule 1 tab in am, 1 in afternoon and 2 po q hs  . lisinopril-hydrochlorothiazide (PRINZIDE,ZESTORETIC) 20-25 MG tablet TAKE ONE TABLET BY MOUTH ONCE DAILY "OV NEEDED FOR ADDITIONAL VISITS"  . montelukast (SINGULAIR) 10 MG tablet Take 1 tablet (10 mg total) by mouth at bedtime.  . mupirocin ointment (BACTROBAN) 2 % APPLY OINTMENT EXTERNALLY TO AFFECTED AREA TWICE DAILY FOR 10 DAYS THEN THE FIRST 5 DAYS OF THE MONTH FOR 3 MONTHS APPLY TO NOSTRILS UNDER B  . omeprazole (PRILOSEC) 40 MG capsule Take 1 capsule (40 mg total) by mouth daily.  . potassium chloride SA (K-DUR,KLOR-CON) 20  MEQ tablet TAKE 1 TABLET BY MOUTH TWICE DAILY  . rosuvastatin (CRESTOR) 20 MG tablet Take 1 tablet (20 mg total) by mouth at bedtime.  . sertraline (ZOLOFT) 100 MG tablet Take 2 tablets (200 mg total) by mouth daily.  . Vitamin D, Ergocalciferol, (DRISDOL) 1.25 MG (50000 UT) CAPS capsule Take 1 capsule (50,000 Units total) by mouth every 7 (seven) days.  . [DISCONTINUED] gabapentin (NEURONTIN) 300 MG capsule Take 300 mg by mouth 3 (three) times daily.     Allergies:  Allergies  Allergen Reactions  . Penicillins Hives and Shortness Of Breath  . Lovastatin     Cramps in legs and feet  . Other     Fragrances in soaps, perfumes, shampoos, conditioners     Review of Systems:  A fourteen system review of systems was performed and found to be positive as per HPI.   Objective:   Blood pressure 137/79, pulse 74, temperature 98 F (36.7 C), height 5\' 2"  (1.575 m), weight 190 lb (86.2 kg), SpO2 97 %. Body mass  index is 34.75 kg/m. General:  Well Developed, well nourished, appropriate for stated age.  Neuro:  Alert and oriented,  extra-ocular muscles intact  HEENT:  Normocephalic, atraumatic, neck supple, no carotid bruits appreciated  Skin:  no gross rash, warm, pink. Cardiac:  RRR, S1 S2 Respiratory:  ECTA B/L and A/P, Not using accessory muscles, speaking in full sentences- unlabored. Vascular:  Ext warm, no cyanosis apprec.; cap RF less 2 sec. Psych:  No HI/SI, judgement and insight good, Euthymic mood. Full Affect.

## 2018-07-12 NOTE — Progress Notes (Signed)
Tiffany Coder, MD  Madolyn Frieze, LPN        We can send in wellbutrin  150 mg daily for three days and then 150 BID for 3 months    I have spoke with the patient and advise of Dr. Ander Slade recs she verbalized understanding nothing further needed.

## 2018-07-12 NOTE — Patient Instructions (Addendum)
If you have insomnia or difficulty sleeping, this information is for you:  - Avoid caffeinated beverages after lunch,  no alcoholic beverages,  no eating within 2-3 hours of lying down,  avoid exposure to blue light before bed,  avoid daytime naps, and  needs to maintain a regular sleep schedule- go to sleep and wake up around the same time every night.   - Resolve concerns or worries before entering bedroom:  Discussed relaxation techniques with patient and to keep a journal to write down fears\ worries.  I suggested seeing a counselor for CBT.   - Recommend patient meditate or do deep breathing exercises to help relax.   Incorporate the use of white noise machines or listen to "sleep meditation music", or recordings of guided meditations for sleep from YouTube which are free, such as  "guided meditation for detachment from over thinking"  by Mayford Knife.     Please realize, EXERCISE IS MEDICINE!  -  American Heart Association [facility]) guidelines for exercise : If you are in good health, without any medical conditions, you should engage in 150-300 minutes of moderate intensity aerobic activity per week.  This means you should be huffing and puffing throughout your workout.   Engaging in regular exercise will improve brain function and memory, as well as improve mood, boost immune system and help with weight management.  As well as the other, more well-known effects of exercise such as decreasing blood sugar levels, decreasing blood pressure,  and decreasing bad cholesterol levels/ increasing good cholesterol levels.     -  The AHA strongly endorses consumption of a diet that contains a variety of foods from all the food categories with an emphasis on fruits and vegetables; fat-free and low-fat dairy products; cereal and grain products; legumes and nuts; and fish, poultry, and/or extra lean meats.    Excessive food intake, especially of foods high in saturated and trans fats, sugar, and salt, should be  avoided.    Adequate water intake of roughly 1/2 of your weight in pounds, should equal the ounces of water per day you should drink.  So for instance, if you're 200 pounds, that would be 100 ounces of water per day.         Mediterranean Diet  Why follow it? Research shows  Those who follow the Mediterranean diet have a reduced risk of heart disease   The diet is associated with a reduced incidence of Parkinson's and Alzheimer's diseases  People following the diet may have longer life expectancies and lower rates of chronic diseases   The Dietary Guidelines for Americans recommends the Mediterranean diet as an eating plan to promote health and prevent disease  What Is the Mediterranean Diet?   Healthy eating plan based on typical foods and recipes of Mediterranean-style cooking  The diet is primarily a plant based diet; these foods should make up a majority of meals   Starches - Plant based foods should make up a majority of meals - They are an important sources of vitamins, minerals, energy, antioxidants, and fiber - Choose whole grains, foods high in fiber and minimally processed items  - Typical grain sources include wheat, oats, barley, corn, brown rice, bulgar, farro, millet, polenta, couscous  - Various types of beans include chickpeas, lentils, fava beans, black beans, white beans   Fruits  Veggies - Large quantities of antioxidant rich fruits & veggies; 6 or more servings  - Vegetables can be eaten raw or lightly drizzled with  oil and cooked  - Vegetables common to the traditional Mediterranean Diet include: artichokes, arugula, beets, broccoli, brussel sprouts, cabbage, carrots, celery, collard greens, cucumbers, eggplant, kale, leeks, lemons, lettuce, mushrooms, okra, onions, peas, peppers, potatoes, pumpkin, radishes, rutabaga, shallots, spinach, sweet potatoes, turnips, zucchini - Fruits common to the Mediterranean Diet include: apples, apricots, avocados, cherries,  clementines, dates, figs, grapefruits, grapes, melons, nectarines, oranges, peaches, pears, pomegranates, strawberries, tangerines  Fats - Replace butter and margarine with healthy oils, such as olive oil, canola oil, and tahini  - Limit nuts to no more than a handful a day  - Nuts include walnuts, almonds, pecans, pistachios, pine nuts  - Limit or avoid candied, honey roasted or heavily salted nuts - Olives are central to the [employer] - can be eaten whole or used in a variety of dishes   Meats Protein - Limiting red meat: no more than a few times a month - When eating red meat: choose lean cuts and keep the portion to the size of deck of cards - Eggs: approx. 0 to 4 times a week  - Fish and lean poultry: at least 2 a week  - Healthy protein sources include, chicken, [place], lean beef, lamb - Increase intake of seafood such as tuna, salmon, trout, mackerel, shrimp, scallops - Avoid or limit high fat processed meats such as sausage and bacon  Dairy - Include moderate amounts of low fat dairy products  - Focus on healthy dairy such as fat free yogurt, skim milk, low or reduced fat cheese - Limit dairy products higher in fat such as whole or 2% milk, cheese, ice cream  Alcohol - Moderate amounts of red wine is ok  - No more than 5 oz daily for women (all ages) and men older than age 67  - No more than 10 oz of wine daily for men younger than 61  Other - Limit sweets and other desserts  - Use herbs and spices instead of salt to flavor foods  - Herbs and spices common to the traditional Mediterranean Diet include: basil, bay leaves, chives, cloves, cumin, fennel, garlic, lavender, marjoram, mint, oregano, parsley, pepper, rosemary, sage, savory, sumac, tarragon, thyme   Its not just a diet, its a lifestyle:   The Mediterranean diet includes lifestyle factors typical of those in the region   Foods, drinks and meals are best eaten with others and savored  Daily physical activity is  important for overall good health  This could be strenuous exercise like running and aerobics  This could also be more leisurely activities such as walking, housework, yard-work, or taking the stairs  Moderation is the key; a balanced and healthy diet accommodates most foods and drinks  Consider portion sizes and frequency of consumption of certain foods   Meal Ideas & Options:   Breakfast:  o Whole wheat toast or whole wheat English muffins with peanut butter & hard boiled egg o Steel cut oats topped with apples & cinnamon and skim milk  o Fresh fruit: banana, strawberries, melon, berries, peaches  o Smoothies: strawberries, bananas, greek yogurt, peanut butter o Low fat greek yogurt with blueberries and granola  o Egg white omelet with spinach and mushrooms o Breakfast couscous: whole wheat couscous, apricots, skim milk, cranberries   Sandwiches:  o Hummus and grilled vegetables (peppers, zucchini, squash) on whole wheat bread   o Grilled chicken on whole wheat pita with lettuce, tomatoes, cucumbers or tzatziki  o Tuna salad on whole wheat bread: tuna  salad made with greek yogurt, olives, red peppers, capers, green onions o Garlic rosemary lamb pita: lamb sauted with garlic, rosemary, salt & pepper; add lettuce, cucumber, greek yogurt to pita - flavor with lemon juice and black pepper   Seafood:  o Mediterranean grilled salmon, seasoned with garlic, basil, parsley, lemon juice and black pepper o Shrimp, lemon, and spinach whole-grain pasta salad made with low fat greek yogurt  o Seared scallops with lemon orzo  o Seared tuna steaks seasoned salt, pepper, coriander topped with tomato mixture of olives, tomatoes, olive oil, minced garlic, parsley, green onions and cappers   Meats:  o Herbed greek chicken salad with kalamata olives, cucumber, feta  o Red bell peppers stuffed with spinach, bulgur, lean ground beef (or lentils) & topped with feta   o Kebabs: skewers of chicken,  tomatoes, onions, zucchini, squash  o [place] burgers: made with red onions, mint, dill, lemon juice, feta cheese topped with roasted red peppers  Vegetarian o Cucumber salad: cucumbers, artichoke hearts, celery, red onion, feta cheese, tossed in olive oil & lemon juice  o Hummus and whole grain pita points with a greek salad (lettuce, tomato, feta, olives, cucumbers, red onion) o Lentil soup with celery, carrots made with vegetable broth, garlic, salt and pepper  o Tabouli salad: parsley, bulgur, mint, scallions, cucumbers, tomato, radishes, lemon juice, olive oil, salt and pepper.  Please think seriously about quitting smoking!  This is very important for your health and well being.    Please let us know in the future if you are interested and ready to quit.   You can also call 1-800-QUIT-NOW 614-431-9704) for free smoking cessation counseling and support.     Also, please go online to www.heart.org (the American Heart Association website) and search "quit smoking ".     Or try the centers for disease control website at: https://www.schmidt.com/  Or, go to the "national cancer institute" web site of NIH:  http://benson.com/   There is a lot of great information on these websites for you to look over.      Want to Quit Smoking? FDA-Approved Products Can Help  Quitting smoking can be hard, but it is possible. In fact, every time you put out a cigarette is a new chance to try quitting again, according to the U.S. Food and Drug Administrations newest tobacco education campaign, Every Try Counts.   If you want to quit--almost 70 percent of adult smokers say they do--you may want to use a smoking cessation product proven to help. Data has shown that using FDA-approved cessation medicine can double your chance of quitting successfully.  Some products contain nicotine as an active ingredient and  others do not. These products include over-the-counter (OTC) options like skin patches, lozenges, and gum, as well as prescription medicines.  Smoking cessation products are intended to help you quit smoking. They are regulated through the [facility] for Drug Evaluation and Research, which ensures that the products are safe and effective and that their benefits outweigh any known associated risks.  The Benefits of Quitting Smoking No matter how much you smoke--or for how long--quitting will benefit you.  Not only will you lower your risk of getting various cancers, including lung cancer, youll also reduce your chances of having heart disease, a stroke, emphysema, and other serious diseases. Quitting also will lower the risk of heart disease and lung cancer in nonsmokers who otherwise would be exposed to your secondhand smoke.  Although there are benefits to quitting  at any age, it is important to quit as soon as possible so your body can begin to heal from the damage caused by smoking. For instance, 12 hours after you quit smoking the carbon monoxide level in your blood drops to normal. Carbon monoxide is harmful because it displaces oxygen in the blood and deprives your heart, brain, and other vital organs of oxygen.  What To Know About Smoking Cessation Products Understanding how smoking cessation products work--and what side effects they may cause--can help you determine which product may be best for you.  If youre considering one of these products, reading labels and talking to your pharmacist and other health care providers are good first steps to take.  You also can check the FDAs website for more information on each product at Drugs@FDA , where you can search for each product by name.  And remember to weigh each products benefits and risks, among other considerations.  About Nicotine Replacement Therapy (NRT) Nicotine is the substance primarily responsible for causing addiction to  tobacco products. Tobacco users who are addicted to nicotine are used to having nicotine in their bodies.  As you try to quit smoking, you may have symptoms of nicotine withdrawal. When you quit, this withdrawal may cause symptoms like cravings, or urges, to smoke; depression; trouble sleeping; irritability; anxiety; and increased appetite.  Nicotine withdrawal can discourage some smokers from continuing with a quit attempt. But the FDA has approved several smoking cessation products designed to help users gradually withdraw from smoking (that is, wean themselves from smoking) by using specific amounts of nicotine that decrease over time. This type of product is called a nicotine replacement therapy or NRT. It supplies nicotine in controlled amounts while sparing you from other chemicals found in tobacco products.  NRTs are available over the counter and by prescription. You should generally use them only for a short time to help you manage nicotine cravings and withdrawal. However, the FDA recognizes that some people may need to use these products longer to stay smoke-free. Talk to your health care provider to determine the best course of treatment for you.  Over-the-counter NRTs are approved for sale to people age 70 and older. They are available under various brand names and sometimes as generic products. They include:  - Skin patches (also called transdermal nicotine patches). These patches are placed on the skin, similar to how you would apply an adhesive bandage. - Chewing gum (also called nicotine gum). This gum must be chewed according to the labeled instructions to be effective. - Lozenges (also called nicotine lozenges). You use these products by dissolving them in your mouth. For over-the-counter products, its important to follow the instructions on the Drug Facts Label (DFL) and to read the enclosed Users Guide for complete directions and other important information. Ask your  health care provider if you have questions.  Currently, prescription nicotine replacement therapy is available only under the brand name Nicotrol, and is available both as a nasal spray and an oral inhaler. The products are FDA-approved only for use by adults.  If you are under age 22 and want to quit smoking, talk to a health care professional about whether you should use nicotine replacement therapies.  Important Advice for People Considering Nicotine Replacement Therapy Women who are pregnant or breastfeeding should talk to their health care providers and use nicotine replacement products only if the health care providers approve.  Also talk to your health care provider before using these products if you have:  diabetes, heart disease, asthma, or stomach ulcers; had a recent heart attack; high blood pressure that is not controlled with medicine; a history of irregular heartbeat; or been prescribed medication to help you quit smoking. If you take prescription medication for depression or asthma, tell your health care provider if you are quitting smoking because he or she may need to change your prescription dose.  Stop using a nicotine replacement product and call your health care professional if you have any of the following symptoms: nausea; dizziness; weakness; vomiting; fast or irregular heartbeat; mouth problems with the lozenge or gum; or redness or swelling of the skin around the patch that does not go away.  About Prescription Cessation Medicines Without Nicotine  The FDA has approved two smoking cessation products that do not contain nicotine. They are Chantix (varenicline tartrate) and Zyban (buproprion hydrochloride). Both are available in tablet form and by prescription only.  Chantix acts at sites in the brain affected by nicotine by reducing the rewarding effects of nicotine. The precise way that Zyban helps with smoking cessation is unknown.  As with other  prescription products, the FDA has evaluated these medicines and found that the benefits outweigh the risks. For users taking these products, risks include changes in behavior, depressed mood, hostility, aggression, and suicidal thoughts or actions.  The most common side effects of Chantix include nausea; constipation; gas; vomiting; and trouble sleeping or vivid, unusual, or strange dreams. Chantix also may change how you react to alcohol, so talk to your health care provider about your drinking habits (if you drink alcohol) and whether these habits need to change. Chantix is not recommended for people under the age of 48.  The most commonly observed side effects consistently associated with the use of Zyban are dry mouth and insomnia.  Because Zyban contains the same active ingredient as the antidepressant Wellbutrin (bupropion), the FDA encourages people who use Zyban--and those who are considering it--to talk to their health care providers about the risks of treatment with antidepressant medicines. Zyban has not been studied in children under the age of 65 and is not approved for use in children and teenagers.  Note: If your health care provider prescribes Chantix or Zyban, please read the products patient medication guide in its entirety. These guides offer important information on side effects, risks, warnings, product ingredients, and what you should talk about with your health care provider before taking the products.  Finally, if you ever have any side effects related to any smoking cessation products, or have any other problems related to your treatment, the FDA would like to hear from you. Please consider making a voluntary and confidential report to the [facility] MedWatch program.  Updated: May 30, 2016

## 2018-07-17 ENCOUNTER — Other Ambulatory Visit: Payer: Self-pay | Admitting: Family Medicine

## 2018-07-17 DIAGNOSIS — J45909 Unspecified asthma, uncomplicated: Secondary | ICD-10-CM

## 2018-07-17 DIAGNOSIS — J3089 Other allergic rhinitis: Secondary | ICD-10-CM

## 2018-07-18 ENCOUNTER — Other Ambulatory Visit: Payer: Self-pay | Admitting: Family Medicine

## 2018-07-18 DIAGNOSIS — G4733 Obstructive sleep apnea (adult) (pediatric): Secondary | ICD-10-CM | POA: Diagnosis not present

## 2018-07-18 DIAGNOSIS — E7800 Pure hypercholesterolemia, unspecified: Secondary | ICD-10-CM

## 2018-07-19 ENCOUNTER — Telehealth: Payer: Self-pay | Admitting: Pulmonary Disease

## 2018-07-19 ENCOUNTER — Ambulatory Visit (INDEPENDENT_AMBULATORY_CARE_PROVIDER_SITE_OTHER)
Admission: RE | Admit: 2018-07-19 | Discharge: 2018-07-19 | Disposition: A | Discharge status: Home / Self Care | Payer: Medicare HMO | Source: Ambulatory Visit | Attending: Pulmonary Disease | Admitting: Pulmonary Disease

## 2018-07-19 DIAGNOSIS — Z720 Tobacco use: Secondary | ICD-10-CM

## 2018-07-19 DIAGNOSIS — J449 Chronic obstructive pulmonary disease, unspecified: Secondary | ICD-10-CM | POA: Diagnosis not present

## 2018-07-19 DIAGNOSIS — Z8709 Personal history of other diseases of the respiratory system: Secondary | ICD-10-CM | POA: Diagnosis not present

## 2018-07-19 NOTE — Telephone Encounter (Signed)
Dr. Ander Slade has reviewed the home sleep test this showed Mild osa.   Recommendations   Treatment options are CPAP with the settings auto 5 to 15.    Weight loss measures .   Advise against driving while sleepy & against medication with sedative side effects.    Make appointment for 3 months for compliance with download with Dr. Ander Slade.    Called and spoke with the patient about the results she would like to come in and talk about her options for treatment.

## 2018-07-23 DIAGNOSIS — C44729 Squamous cell carcinoma of skin of left lower limb, including hip: Secondary | ICD-10-CM | POA: Diagnosis not present

## 2018-07-23 DIAGNOSIS — L820 Inflamed seborrheic keratosis: Secondary | ICD-10-CM | POA: Diagnosis not present

## 2018-07-23 DIAGNOSIS — L821 Other seborrheic keratosis: Secondary | ICD-10-CM | POA: Diagnosis not present

## 2018-07-23 DIAGNOSIS — L570 Actinic keratosis: Secondary | ICD-10-CM | POA: Diagnosis not present

## 2018-07-23 DIAGNOSIS — L578 Other skin changes due to chronic exposure to nonionizing radiation: Secondary | ICD-10-CM | POA: Diagnosis not present

## 2018-07-30 ENCOUNTER — Ambulatory Visit (INDEPENDENT_AMBULATORY_CARE_PROVIDER_SITE_OTHER): Payer: Medicare HMO | Admitting: Pulmonary Disease

## 2018-07-30 ENCOUNTER — Encounter: Payer: Self-pay | Admitting: Pulmonary Disease

## 2018-07-30 VITALS — BP 142/70 | HR 68 | Ht 62.0 in | Wt 196.6 lb

## 2018-07-30 DIAGNOSIS — G4733 Obstructive sleep apnea (adult) (pediatric): Secondary | ICD-10-CM | POA: Diagnosis not present

## 2018-07-30 DIAGNOSIS — J449 Chronic obstructive pulmonary disease, unspecified: Secondary | ICD-10-CM | POA: Diagnosis not present

## 2018-07-30 NOTE — Patient Instructions (Signed)
Obstructive sleep apnea  Considering other options of treatment Inspire device as discussed We find out more information about other options I will be glad to discuss it with you   We will see you back in the office in 3 months  Call with any other significant concerns

## 2018-07-30 NOTE — Progress Notes (Signed)
Tiffany Murphy    [id number]    10-Apr-1951  Primary Care Physician:Opalski, Neoma Laming, DO  Referring Physician: Mellody Dance, [place], [place] [address]  Chief complaint:   Patient with a history of obstructive lung disease In for evaluation for COPD  HPI: Sleep study did reveal mild obstructive sleep apnea Patient had questions about an inspire device which we did talk about-inspire is improved approved for moderate to severe obstructive sleep apnea-patient's diagnosis is mild obstructive sleep apnea  Has a history of COPD History of obstructive sleep apnea Did use CPAP regularly in the past-has not in the last year-she did try to use it again recently and felt the pressure was not right--she has not been able to tolerate it  She has an active smoker, smokes about half a pack a day, at the area she was smoking 2 packs of cigarettes a day  She has a history of hypertension, mood disorder, panic attacks Hyperlipidemia Lung nodule noted in the past  History of chronic fatigue  She was on Symbicort-feels it may not be helping as well as it did in the past  Outpatient Encounter Medications as of 07/30/2018  Medication Sig  . albuterol (PROVENTIL HFA;VENTOLIN HFA) 108 (90 Base) MCG/ACT inhaler Inhale 2 puffs into the lungs every 6 (six) hours as needed for wheezing.  Marland Kitchen ALPRAZolam (XANAX) 0.25 MG tablet 1 tab as needed for PANIC attack only  . buPROPion (WELLBUTRIN SR) 150 MG 12 hr tablet Please take 150mg  (one tablet) for three days then take one tablet twice a day for 3 months.  . busPIRone (BUSPAR) 10 MG tablet Take 1 tablet (10 mg total) by mouth 3 (three) times daily.  Marland Kitchen dicyclomine (BENTYL) 10 MG capsule Take 1 capsule (10 mg total) by mouth 4 (four) times daily.  . fluticasone (FLONASE) 50 MCG/ACT nasal spray Place 2 sprays into both nostrils daily.  . Fluticasone-Umeclidin-Vilant (TRELEGY ELLIPTA) 100-62.5-25 MCG/INH AEPB Inhale 1 puff into the  lungs daily.  Marland Kitchen gabapentin (NEURONTIN) 300 MG capsule 1 tab in am, 1 in afternoon and 2 po q hs  . lisinopril-hydrochlorothiazide (PRINZIDE,ZESTORETIC) 20-25 MG tablet TAKE ONE TABLET BY MOUTH ONCE DAILY "OV NEEDED FOR ADDITIONAL VISITS"  . montelukast (SINGULAIR) 10 MG tablet TAKE 1 TABLET BY MOUTH AT BEDTIME  . mupirocin ointment (BACTROBAN) 2 % APPLY OINTMENT EXTERNALLY TO AFFECTED AREA TWICE DAILY FOR 10 DAYS THEN THE FIRST 5 DAYS OF THE MONTH FOR 3 MONTHS APPLY TO NOSTRILS UNDER B  . omeprazole (PRILOSEC) 40 MG capsule Take 1 capsule (40 mg total) by mouth daily.  . potassium chloride SA (K-DUR,KLOR-CON) 20 MEQ tablet TAKE 1 TABLET BY MOUTH TWICE DAILY  . rosuvastatin (CRESTOR) 20 MG tablet TAKE 1 TABLET BY MOUTH AT BEDTIME  . sertraline (ZOLOFT) 100 MG tablet Take 2 tablets (200 mg total) by mouth daily.  . Vitamin D, Ergocalciferol, (DRISDOL) 1.25 MG (50000 UT) CAPS capsule Take 1 capsule (50,000 Units total) by mouth every 7 (seven) days.   No facility-administered encounter medications on file as of 07/30/2018.     Allergies as of 07/30/2018 - Review Complete 07/30/2018  Allergen Reaction Noted  . Penicillins Hives and Shortness Of Breath 08/05/2012  . Lovastatin  07/11/2013  . Other  07/11/2013    Past Medical History:  Diagnosis Date  . Asthma   . COPD (chronic obstructive pulmonary disease) ([place])   . GERD (gastroesophageal reflux disease)   . Hypertension   .  Tobacco abuse     Past Surgical History:  Procedure Laterality Date  . APPENDECTOMY    . COLONOSCOPY  08/26/2015   Colonic polyps status post polypectomy. Minimal sigmoid diverticulosis.   Marland Kitchen ESOPHAGOGASTRODUODENOSCOPY  02/25/2011   Large hiatal hernia otherwise normal EGD.  Marland Kitchen VAGINAL HYSTERECTOMY      Family History  Problem Relation Age of Onset  . Alzheimer's disease Father 43  . Diabetes Father   . CAD Mother 49  . Colon cancer Neg Hx   . Esophageal cancer Neg Hx     Social History   Socioeconomic  History  . Marital status: Married    Spouse name: Not on file  . Number of children: 2  . Years of education: Not on file  . Highest education level: Not on file  Occupational History  . Not on file  Social Needs  . Financial resource strain: Not on file  . Food insecurity:    Worry: Not on file    Inability: Not on file  . Transportation needs:    Medical: Not on file    Non-medical: Not on file  Tobacco Use  . Smoking status: Current Every Day Smoker    Packs/day: 0.50    Years: 44.00    Pack years: 22.00    Types: Cigarettes  . Smokeless tobacco: Never Used  Substance and Sexual Activity  . Alcohol use: No    Alcohol/week: 0.0 standard drinks  . Drug use: No  . Sexual activity: Yes    Birth control/protection: Condom, None  Lifestyle  . Physical activity:    Days per week: Not on file    Minutes per session: Not on file  . Stress: Not on file  Relationships  . Social connections:    Talks on phone: Not on file    Gets together: Not on file    Attends religious service: Not on file    Active member of club or organization: Not on file    Attends meetings of clubs or organizations: Not on file    Relationship status: Not on file  . Intimate partner violence:    Fear of current or ex partner: Not on file    Emotionally abused: Not on file    Physically abused: Not on file    Forced sexual activity: Not on file  Other Topics Concern  . Not on file  Social History Narrative   Works at [employer].     Review of Systems  Constitutional: Positive for fatigue.  HENT: Negative.   Eyes: Negative.   Respiratory: Positive for apnea and shortness of breath.   Cardiovascular: Negative.   Gastrointestinal: Negative.   Endocrine: Negative.   Genitourinary: Negative.   Psychiatric/Behavioral: Positive for sleep disturbance.  All other systems reviewed and are negative.   Vitals:   07/30/18 1410  BP: (!) 142/70  Pulse: 68  SpO2: 96%   Physical  Exam  Constitutional: She appears well-developed and well-nourished.  HENT:  Head: Normocephalic and atraumatic.  Mallampati 3  Eyes: Pupils are equal, round, and reactive to light. Conjunctivae are normal. Right eye exhibits no discharge. Left eye exhibits no discharge.  Neck: Normal range of motion. Neck supple. No tracheal deviation present. No thyromegaly present.  Cardiovascular: Normal rate and regular rhythm.  Pulmonary/Chest: Effort normal. No respiratory distress. She has no wheezes. She has no rales.  Abdominal: Soft. Bowel sounds are normal. She exhibits no distension. There is no abdominal tenderness. There is no  rebound.  Musculoskeletal: Normal range of motion.   Data Reviewed: Report of a previous CT scan noted in record Most recent CT reviewed showing stable nodules-reviewed by myself  Sleep study results not available  Assessment:  .  Obstructive lung disease -Very significant smoking history -Did have breathing study in the past but not within the last couple years -Continue bronchodilator treatments  .  History of abnormal CT scan of the chest -CT report from 2016 does show some nodularity, some mucous plugging -Significant smoking history -Repeat CT showing stable findings-likely old granulomatous disease  .  History of obstructive sleep apnea -Used CPAP well in the past but recently has not used it -When she tried to use her CPAP again-feels like the pressure is not appropriate, not tolerable -Repeat study reveals mild obstructive sleep apnea -She is evaluating other modes of treatment, does not want to start CPAP at present -We did talk about inspire device-only approved for moderate to severe sleep apnea  .  Shortness of breath -Related to history of COPD .  Active smoker -Has quit for over 3 months in the past with different interventions -She is actively thinking about quitting -She recently cut back from 2 packs a day to half a pack a  day  Plan/Recommendations:   Continue use of inhalers  I will see her back in the office in about 3 months  Is to call with any other significant concerns  Sherrilyn Rist MD [facility] 07/30/2018, 2:11 PM  CC: Mellody Dance, DO

## 2018-08-02 ENCOUNTER — Other Ambulatory Visit: Payer: Self-pay | Admitting: *Deleted

## 2018-08-02 DIAGNOSIS — G4733 Obstructive sleep apnea (adult) (pediatric): Secondary | ICD-10-CM

## 2018-08-08 DIAGNOSIS — D0472 Carcinoma in situ of skin of left lower limb, including hip: Secondary | ICD-10-CM | POA: Diagnosis not present

## 2018-08-16 DIAGNOSIS — R69 Illness, unspecified: Secondary | ICD-10-CM | POA: Diagnosis not present

## 2018-08-27 ENCOUNTER — Other Ambulatory Visit: Payer: Self-pay | Admitting: Family Medicine

## 2018-08-28 ENCOUNTER — Other Ambulatory Visit: Payer: Medicare HMO

## 2018-08-28 ENCOUNTER — Other Ambulatory Visit: Payer: Self-pay

## 2018-08-28 DIAGNOSIS — E559 Vitamin D deficiency, unspecified: Secondary | ICD-10-CM

## 2018-08-28 DIAGNOSIS — R7302 Impaired glucose tolerance (oral): Secondary | ICD-10-CM

## 2018-08-29 ENCOUNTER — Other Ambulatory Visit: Payer: Self-pay | Admitting: Family Medicine

## 2018-08-31 ENCOUNTER — Ambulatory Visit: Payer: Medicare HMO | Admitting: Family Medicine

## 2018-09-11 ENCOUNTER — Other Ambulatory Visit: Payer: Self-pay

## 2018-09-11 ENCOUNTER — Telehealth: Payer: Self-pay | Admitting: Family Medicine

## 2018-09-11 DIAGNOSIS — J3089 Other allergic rhinitis: Secondary | ICD-10-CM

## 2018-09-11 NOTE — Telephone Encounter (Signed)
Sent refill request for flonase to Dr. Raliegh Scarlet to review.  Medication was last filled by pervious provider. MPulliam, CMA/RT(R)

## 2018-09-11 NOTE — Telephone Encounter (Signed)
Patient left VM regarding refill of nasal spray (patient did not clearly define exactly which med she takes but states she is requesting a refill of a nasal spray that Dr. Jenetta Downer has never refilled for her). Please advise if we can refill this med and place order please

## 2018-09-11 NOTE — Telephone Encounter (Signed)
Patient called and is requesting a refill on flonase.  Medication was last filled by a pervious provider.  LOV 07/12/2018.  Please review and advise. MPulliam, CMA/RT(R)

## 2018-09-12 MED ORDER — FLUTICASONE PROPIONATE 50 MCG/ACT NA SUSP: 2.0000 | Freq: Every day | NASAL | 1 refills | Status: DC

## 2018-09-20 ENCOUNTER — Ambulatory Visit: Payer: Medicare HMO | Admitting: Pulmonary Disease

## 2018-10-04 ENCOUNTER — Other Ambulatory Visit: Payer: Self-pay | Admitting: Gastroenterology

## 2018-10-10 ENCOUNTER — Ambulatory Visit: Payer: Medicare HMO | Admitting: Pulmonary Disease

## 2018-10-12 ENCOUNTER — Other Ambulatory Visit: Payer: Self-pay | Admitting: Family Medicine

## 2018-10-12 DIAGNOSIS — E876 Hypokalemia: Secondary | ICD-10-CM

## 2018-10-12 DIAGNOSIS — E7800 Pure hypercholesterolemia, unspecified: Secondary | ICD-10-CM

## 2018-10-12 NOTE — Telephone Encounter (Signed)
Patient was last seen in January and was told to follow-up in 1 to 2 months for a Medicare wellness and she never did.    Please schedule patient for Medicare wellness.  Please notify her that we only gave her a 30-day supply of meds this time because she needs to schedule a video visit Medicare wellness in near future.

## 2018-10-17 ENCOUNTER — Other Ambulatory Visit: Payer: Self-pay

## 2018-10-17 ENCOUNTER — Ambulatory Visit: Payer: Medicare HMO | Admitting: Family Medicine

## 2018-10-18 ENCOUNTER — Ambulatory Visit: Payer: Medicare HMO | Admitting: Family Medicine

## 2018-10-18 NOTE — Progress Notes (Signed)
Patient was called 4 different times between 1:00 and 1:21 PM.  Her mailbox was full and hence we could not leave her a message.  She will need to be rescheduled to a later date.

## 2018-10-21 ENCOUNTER — Ambulatory Visit (HOSPITAL_COMMUNITY): Payer: Medicare HMO

## 2018-10-21 ENCOUNTER — Encounter (HOSPITAL_COMMUNITY): Payer: Self-pay | Admitting: *Deleted

## 2018-10-21 ENCOUNTER — Other Ambulatory Visit: Payer: Self-pay

## 2018-10-21 ENCOUNTER — Ambulatory Visit (INDEPENDENT_AMBULATORY_CARE_PROVIDER_SITE_OTHER): Payer: Medicare HMO

## 2018-10-21 ENCOUNTER — Ambulatory Visit (HOSPITAL_COMMUNITY)
Admission: EM | Admit: 2018-10-21 | Discharge: 2018-10-21 | Disposition: A | Discharge status: Home / Self Care | Payer: Medicare HMO | Attending: Emergency Medicine | Admitting: Emergency Medicine

## 2018-10-21 DIAGNOSIS — Z23 Encounter for immunization: Secondary | ICD-10-CM

## 2018-10-21 DIAGNOSIS — S52612A Displaced fracture of left ulna styloid process, initial encounter for closed fracture: Secondary | ICD-10-CM | POA: Diagnosis not present

## 2018-10-21 DIAGNOSIS — S52572A Other intraarticular fracture of lower end of left radius, initial encounter for closed fracture: Secondary | ICD-10-CM

## 2018-10-21 HISTORY — DX: Pure hypercholesterolemia, unspecified: E78.00

## 2018-10-21 MED ORDER — TETANUS-DIPHTH-ACELL PERTUSSIS 5-2.5-18.5 LF-MCG/0.5 IM SUSP
INTRAMUSCULAR | Status: AC
  Filled 2018-10-21: qty 0.5

## 2018-10-21 MED ORDER — TETANUS-DIPHTH-ACELL PERTUSSIS 5-2.5-18.5 LF-MCG/0.5 IM SUSP
0.5000 mL | Freq: Once | INTRAMUSCULAR | Status: AC
  Administered 2018-10-21: 11:00:00 0.5 mL via INTRAMUSCULAR

## 2018-10-21 MED ORDER — TRAMADOL HCL 50 MG PO TABS: 50.0000 mg | ORAL_TABLET | Freq: Four times a day (QID) | ORAL | 0 refills | Status: DC | PRN

## 2018-10-21 MED ORDER — IBUPROFEN 600 MG PO TABS: 600.0000 mg | ORAL_TABLET | Freq: Three times a day (TID) | ORAL | 0 refills | Status: DC | PRN

## 2018-10-21 MED ORDER — IBUPROFEN 800 MG PO TABS
ORAL_TABLET | ORAL | Status: AC
  Filled 2018-10-21: qty 1

## 2018-10-21 MED ORDER — IBUPROFEN 800 MG PO TABS
800.0000 mg | ORAL_TABLET | Freq: Once | ORAL | Status: AC
  Administered 2018-10-21: 800 mg via ORAL

## 2018-10-21 NOTE — Progress Notes (Signed)
Orthopedic Tech Progress Note Patient Details:  Tiffany Murphy 11/28/1950 [id number]  Ortho Devices Type of Ortho Device: Ace wrap, Arm sling, Sugartong splint Ortho Device/Splint Location: left Ortho Device/Splint Interventions: Application   Post Interventions Patient Tolerated: Well Instructions Provided: Care of device   [name] 10/21/2018, 12:22 PM

## 2018-10-21 NOTE — ED Notes (Signed)
Ortho tech paged  
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
## 2018-10-21 NOTE — ED Notes (Signed)
Ortho tech notified of sugartong splint request.

## 2018-10-21 NOTE — Discharge Instructions (Signed)
You have fracture to your wrist, surgery will be considered with orthopedics, therefore calling tomorrow to set up follow up is very important.  Ice, elevation, use of sling.  Keep splint in place.  Tylenol or ibuprofen as needed for pain, tramadol for breakthrough pain. May cause drowsiness. Please do not take if driving or drinking alcohol.
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
## 2018-10-22 ENCOUNTER — Other Ambulatory Visit: Payer: Self-pay

## 2018-10-22 ENCOUNTER — Ambulatory Visit: Payer: Medicare HMO | Admitting: Family Medicine

## 2018-10-22 DIAGNOSIS — S52502A Unspecified fracture of the lower end of left radius, initial encounter for closed fracture: Secondary | ICD-10-CM | POA: Diagnosis not present

## 2018-10-23 ENCOUNTER — Encounter (HOSPITAL_BASED_OUTPATIENT_CLINIC_OR_DEPARTMENT_OTHER): Payer: Self-pay | Admitting: *Deleted

## 2018-10-23 ENCOUNTER — Other Ambulatory Visit: Payer: Self-pay

## 2018-10-24 ENCOUNTER — Encounter (HOSPITAL_BASED_OUTPATIENT_CLINIC_OR_DEPARTMENT_OTHER)
Admission: RE | Admit: 2018-10-24 | Discharge: 2018-10-24 | Disposition: A | Discharge status: Home / Self Care | Payer: Medicare HMO | Source: Ambulatory Visit | Attending: Orthopedic Surgery | Admitting: Orthopedic Surgery

## 2018-10-24 DIAGNOSIS — J449 Chronic obstructive pulmonary disease, unspecified: Secondary | ICD-10-CM | POA: Diagnosis not present

## 2018-10-24 DIAGNOSIS — Z833 Family history of diabetes mellitus: Secondary | ICD-10-CM | POA: Diagnosis not present

## 2018-10-24 DIAGNOSIS — E7800 Pure hypercholesterolemia, unspecified: Secondary | ICD-10-CM | POA: Diagnosis not present

## 2018-10-24 DIAGNOSIS — Z79899 Other long term (current) drug therapy: Secondary | ICD-10-CM | POA: Diagnosis not present

## 2018-10-24 DIAGNOSIS — E669 Obesity, unspecified: Secondary | ICD-10-CM | POA: Diagnosis not present

## 2018-10-24 DIAGNOSIS — Z8249 Family history of ischemic heart disease and other diseases of the circulatory system: Secondary | ICD-10-CM | POA: Diagnosis not present

## 2018-10-24 DIAGNOSIS — S52572A Other intraarticular fracture of lower end of left radius, initial encounter for closed fracture: Secondary | ICD-10-CM | POA: Diagnosis not present

## 2018-10-24 DIAGNOSIS — Z6834 Body mass index (BMI) 34.0-34.9, adult: Secondary | ICD-10-CM | POA: Diagnosis not present

## 2018-10-24 DIAGNOSIS — I10 Essential (primary) hypertension: Secondary | ICD-10-CM | POA: Diagnosis not present

## 2018-10-24 DIAGNOSIS — K219 Gastro-esophageal reflux disease without esophagitis: Secondary | ICD-10-CM | POA: Diagnosis not present

## 2018-10-24 DIAGNOSIS — K449 Diaphragmatic hernia without obstruction or gangrene: Secondary | ICD-10-CM | POA: Diagnosis not present

## 2018-10-24 DIAGNOSIS — Z7951 Long term (current) use of inhaled steroids: Secondary | ICD-10-CM | POA: Diagnosis not present

## 2018-10-24 DIAGNOSIS — Z820 Family history of epilepsy and other diseases of the nervous system: Secondary | ICD-10-CM | POA: Diagnosis not present

## 2018-10-24 DIAGNOSIS — F329 Major depressive disorder, single episode, unspecified: Secondary | ICD-10-CM | POA: Diagnosis not present

## 2018-10-24 DIAGNOSIS — F419 Anxiety disorder, unspecified: Secondary | ICD-10-CM | POA: Diagnosis not present

## 2018-10-24 DIAGNOSIS — F17210 Nicotine dependence, cigarettes, uncomplicated: Secondary | ICD-10-CM | POA: Diagnosis not present

## 2018-10-24 DIAGNOSIS — Z90710 Acquired absence of both cervix and uterus: Secondary | ICD-10-CM | POA: Diagnosis not present

## 2018-10-24 DIAGNOSIS — Z791 Long term (current) use of non-steroidal anti-inflammatories (NSAID): Secondary | ICD-10-CM | POA: Diagnosis not present

## 2018-10-24 DIAGNOSIS — G4730 Sleep apnea, unspecified: Secondary | ICD-10-CM | POA: Diagnosis not present

## 2018-10-24 DIAGNOSIS — W1789XA Other fall from one level to another, initial encounter: Secondary | ICD-10-CM | POA: Diagnosis not present

## 2018-10-24 LAB — BASIC METABOLIC PANEL
Anion gap: 10 (ref 5–15)
BUN: 19 mg/dL (ref 8–23)
CO2: 27 mmol/L (ref 22–32)
Calcium: 8.9 mg/dL (ref 8.9–10.3)
Chloride: 104 mmol/L (ref 98–111)
Creatinine, Ser: 0.97 mg/dL (ref 0.44–1.00)
GFR calc Af Amer: >60 mL/min (ref >60)
GFR calc non Af Amer: 60 mL/min — ABNORMAL LOW (ref >60)
Glucose, Bld: 137 mg/dL — ABNORMAL HIGH (ref 70–99)
Potassium: 3.9 mmol/L (ref 3.5–5.1)
Sodium: 141 mmol/L (ref 135–145)

## 2018-10-24 NOTE — Progress Notes (Signed)
Ensure pre surgery drink given with instructions to complete by 0700 dos, pt verbalized understanding. 

## 2018-10-25 ENCOUNTER — Encounter (HOSPITAL_BASED_OUTPATIENT_CLINIC_OR_DEPARTMENT_OTHER)
Admission: RE | Disposition: A | Discharge status: Home / Self Care | Payer: Self-pay | Source: Home / Self Care | Attending: Orthopedic Surgery

## 2018-10-25 ENCOUNTER — Ambulatory Visit (HOSPITAL_BASED_OUTPATIENT_CLINIC_OR_DEPARTMENT_OTHER): Payer: Medicare HMO | Admitting: Certified Registered"

## 2018-10-25 ENCOUNTER — Ambulatory Visit (HOSPITAL_BASED_OUTPATIENT_CLINIC_OR_DEPARTMENT_OTHER)
Admission: RE | Admit: 2018-10-25 | Discharge: 2018-10-25 | Disposition: A | Discharge status: Home / Self Care | Payer: Medicare HMO | Attending: Orthopedic Surgery | Admitting: Orthopedic Surgery

## 2018-10-25 ENCOUNTER — Other Ambulatory Visit: Payer: Self-pay

## 2018-10-25 ENCOUNTER — Encounter (HOSPITAL_BASED_OUTPATIENT_CLINIC_OR_DEPARTMENT_OTHER): Payer: Self-pay | Admitting: Certified Registered"

## 2018-10-25 DIAGNOSIS — Z791 Long term (current) use of non-steroidal anti-inflammatories (NSAID): Secondary | ICD-10-CM | POA: Insufficient documentation

## 2018-10-25 DIAGNOSIS — G4730 Sleep apnea, unspecified: Secondary | ICD-10-CM | POA: Diagnosis not present

## 2018-10-25 DIAGNOSIS — E669 Obesity, unspecified: Secondary | ICD-10-CM | POA: Insufficient documentation

## 2018-10-25 DIAGNOSIS — Z6834 Body mass index (BMI) 34.0-34.9, adult: Secondary | ICD-10-CM | POA: Insufficient documentation

## 2018-10-25 DIAGNOSIS — Z8249 Family history of ischemic heart disease and other diseases of the circulatory system: Secondary | ICD-10-CM | POA: Insufficient documentation

## 2018-10-25 DIAGNOSIS — Z90710 Acquired absence of both cervix and uterus: Secondary | ICD-10-CM | POA: Insufficient documentation

## 2018-10-25 DIAGNOSIS — S52502A Unspecified fracture of the lower end of left radius, initial encounter for closed fracture: Secondary | ICD-10-CM | POA: Insufficient documentation

## 2018-10-25 DIAGNOSIS — E7800 Pure hypercholesterolemia, unspecified: Secondary | ICD-10-CM | POA: Insufficient documentation

## 2018-10-25 DIAGNOSIS — F329 Major depressive disorder, single episode, unspecified: Secondary | ICD-10-CM | POA: Insufficient documentation

## 2018-10-25 DIAGNOSIS — S52572A Other intraarticular fracture of lower end of left radius, initial encounter for closed fracture: Secondary | ICD-10-CM | POA: Diagnosis not present

## 2018-10-25 DIAGNOSIS — Z79899 Other long term (current) drug therapy: Secondary | ICD-10-CM | POA: Insufficient documentation

## 2018-10-25 DIAGNOSIS — J449 Chronic obstructive pulmonary disease, unspecified: Secondary | ICD-10-CM | POA: Insufficient documentation

## 2018-10-25 DIAGNOSIS — F17210 Nicotine dependence, cigarettes, uncomplicated: Secondary | ICD-10-CM | POA: Diagnosis not present

## 2018-10-25 DIAGNOSIS — K219 Gastro-esophageal reflux disease without esophagitis: Secondary | ICD-10-CM | POA: Diagnosis not present

## 2018-10-25 DIAGNOSIS — Z820 Family history of epilepsy and other diseases of the nervous system: Secondary | ICD-10-CM | POA: Insufficient documentation

## 2018-10-25 DIAGNOSIS — I10 Essential (primary) hypertension: Secondary | ICD-10-CM | POA: Insufficient documentation

## 2018-10-25 DIAGNOSIS — W1789XA Other fall from one level to another, initial encounter: Secondary | ICD-10-CM | POA: Insufficient documentation

## 2018-10-25 DIAGNOSIS — G8918 Other acute postprocedural pain: Secondary | ICD-10-CM | POA: Diagnosis not present

## 2018-10-25 DIAGNOSIS — K449 Diaphragmatic hernia without obstruction or gangrene: Secondary | ICD-10-CM | POA: Insufficient documentation

## 2018-10-25 DIAGNOSIS — Z7951 Long term (current) use of inhaled steroids: Secondary | ICD-10-CM | POA: Insufficient documentation

## 2018-10-25 DIAGNOSIS — F419 Anxiety disorder, unspecified: Secondary | ICD-10-CM | POA: Insufficient documentation

## 2018-10-25 DIAGNOSIS — J45909 Unspecified asthma, uncomplicated: Secondary | ICD-10-CM | POA: Diagnosis not present

## 2018-10-25 DIAGNOSIS — Z833 Family history of diabetes mellitus: Secondary | ICD-10-CM | POA: Insufficient documentation

## 2018-10-25 HISTORY — PX: OPEN REDUCTION INTERNAL FIXATION (ORIF) DISTAL RADIAL FRACTURE: SHX5989

## 2018-10-25 HISTORY — DX: Sleep apnea, unspecified: G47.30

## 2018-10-25 HISTORY — DX: Unspecified fracture of the lower end of left radius, initial encounter for closed fracture: S52.502A

## 2018-10-25 SURGERY — OPEN REDUCTION INTERNAL FIXATION (ORIF) DISTAL RADIUS FRACTURE
Anesthesia: General | Site: Wrist | Laterality: Left

## 2018-10-25 MED ORDER — SCOPOLAMINE 1 MG/3DAYS TD PT72: 1.0000 | MEDICATED_PATCH | Freq: Once | TRANSDERMAL | Status: DC | PRN

## 2018-10-25 MED ORDER — SENNA-DOCUSATE SODIUM 8.6-50 MG PO TABS: 2.0000 | ORAL_TABLET | Freq: Every day | ORAL | 1 refills | Status: DC

## 2018-10-25 MED ORDER — HYDROMORPHONE HCL 1 MG/ML IJ SOLN: 0.2500 mg | INTRAMUSCULAR | Status: DC | PRN

## 2018-10-25 MED ORDER — PROPOFOL 500 MG/50ML IV EMUL
INTRAVENOUS | Status: DC | PRN
  Administered 2018-10-25: 50 ug/kg/min via INTRAVENOUS

## 2018-10-25 MED ORDER — CEFAZOLIN SODIUM-DEXTROSE 2-4 GM/100ML-% IV SOLN: 2.0000 g | INTRAVENOUS | Status: DC

## 2018-10-25 MED ORDER — FENTANYL CITRATE (PF) 100 MCG/2ML IJ SOLN
50.0000 ug | INTRAMUSCULAR | Status: DC | PRN
  Administered 2018-10-25: 11:00:00 50 ug via INTRAVENOUS

## 2018-10-25 MED ORDER — OXYCODONE HCL 5 MG/5ML PO SOLN: 5.0000 mg | Freq: Once | ORAL | Status: DC | PRN

## 2018-10-25 MED ORDER — CLINDAMYCIN PHOSPHATE 900 MG/50ML IV SOLN
INTRAVENOUS | Status: DC | PRN
  Administered 2018-10-25: 900 mg via INTRAVENOUS

## 2018-10-25 MED ORDER — GABAPENTIN 300 MG PO CAPS
300.0000 mg | ORAL_CAPSULE | Freq: Once | ORAL | Status: AC
  Administered 2018-10-25: 300 mg via ORAL

## 2018-10-25 MED ORDER — MIDAZOLAM HCL 2 MG/2ML IJ SOLN
1.0000 mg | INTRAMUSCULAR | Status: DC | PRN
  Administered 2018-10-25: 11:00:00 2 mg via INTRAVENOUS

## 2018-10-25 MED ORDER — HYDROCODONE-ACETAMINOPHEN 10-325 MG PO TABS: 1.0000 | ORAL_TABLET | Freq: Four times a day (QID) | ORAL | 0 refills | Status: DC | PRN

## 2018-10-25 MED ORDER — MIDAZOLAM HCL 2 MG/2ML IJ SOLN
INTRAMUSCULAR | Status: AC
  Filled 2018-10-25: qty 2

## 2018-10-25 MED ORDER — CEFAZOLIN SODIUM-DEXTROSE 2-4 GM/100ML-% IV SOLN
INTRAVENOUS | Status: AC
  Filled 2018-10-25: qty 100

## 2018-10-25 MED ORDER — ACETAMINOPHEN 500 MG PO TABS
ORAL_TABLET | ORAL | Status: AC
  Filled 2018-10-25: qty 2

## 2018-10-25 MED ORDER — OXYCODONE HCL 5 MG PO TABS: 5.0000 mg | ORAL_TABLET | Freq: Once | ORAL | Status: DC | PRN

## 2018-10-25 MED ORDER — PROMETHAZINE HCL 25 MG/ML IJ SOLN: 6.2500 mg | INTRAMUSCULAR | Status: DC | PRN

## 2018-10-25 MED ORDER — ACETAMINOPHEN 500 MG PO TABS
1000.0000 mg | ORAL_TABLET | Freq: Once | ORAL | Status: AC
  Administered 2018-10-25: 11:00:00 1000 mg via ORAL

## 2018-10-25 MED ORDER — ROPIVACAINE HCL 5 MG/ML IJ SOLN
INTRAMUSCULAR | Status: DC | PRN
  Administered 2018-10-25: 30 mL via PERINEURAL

## 2018-10-25 MED ORDER — MEPERIDINE HCL 25 MG/ML IJ SOLN: 6.2500 mg | INTRAMUSCULAR | Status: DC | PRN

## 2018-10-25 MED ORDER — LACTATED RINGERS IV SOLN
INTRAVENOUS | Status: DC
  Administered 2018-10-25: 11:00:00 10 mL/h via INTRAVENOUS

## 2018-10-25 MED ORDER — ONDANSETRON HCL 4 MG/2ML IJ SOLN
INTRAMUSCULAR | Status: DC | PRN
  Administered 2018-10-25: 4 mg via INTRAVENOUS

## 2018-10-25 MED ORDER — GABAPENTIN 300 MG PO CAPS
ORAL_CAPSULE | ORAL | Status: AC
  Filled 2018-10-25: qty 1

## 2018-10-25 MED ORDER — CHLORHEXIDINE GLUCONATE 4 % EX LIQD: 60.0000 mL | Freq: Once | CUTANEOUS | Status: DC

## 2018-10-25 MED ORDER — FENTANYL CITRATE (PF) 100 MCG/2ML IJ SOLN
INTRAMUSCULAR | Status: AC
  Filled 2018-10-25: qty 2

## 2018-10-25 MED ORDER — CLINDAMYCIN PHOSPHATE 900 MG/50ML IV SOLN
INTRAVENOUS | Status: AC
  Filled 2018-10-25: qty 50

## 2018-10-25 MED ORDER — BUPIVACAINE HCL (PF) 0.25 % IJ SOLN
INTRAMUSCULAR | Status: AC
  Filled 2018-10-25: qty 30

## 2018-10-25 SURGICAL SUPPLY — 71 items
BANDAGE ACE 3X5.8 VEL STRL LF (GAUZE/BANDAGES/DRESSINGS) ×3 IMPLANT
BANDAGE ACE 4X5 VEL STRL LF (GAUZE/BANDAGES/DRESSINGS) IMPLANT
BIT DRILL 2.2 SS TIBIAL (BIT) ×3 IMPLANT
BLADE MINI RND TIP GREEN BEAV (BLADE) IMPLANT
BLADE SURG 15 STRL LF DISP TIS (BLADE) ×1 IMPLANT
BLADE SURG 15 STRL SS (BLADE) ×2
BNDG COHESIVE 4X5 TAN STRL (GAUZE/BANDAGES/DRESSINGS) ×3 IMPLANT
BNDG ESMARK 4X9 LF (GAUZE/BANDAGES/DRESSINGS) ×3 IMPLANT
CHLORAPREP W/TINT 26 (MISCELLANEOUS) ×3 IMPLANT
CLOSURE STERI-STRIP 1/2X4 (GAUZE/BANDAGES/DRESSINGS) ×1
CLOSURE WOUND 1/4X4 (GAUZE/BANDAGES/DRESSINGS) ×1
CLSR STERI-STRIP ANTIMIC 1/2X4 (GAUZE/BANDAGES/DRESSINGS) ×2 IMPLANT
CORD BIPOLAR FORCEPS 12FT (ELECTRODE) ×3 IMPLANT
COVER BACK TABLE REUSABLE LG (DRAPES) ×3 IMPLANT
COVER WAND RF STERILE (DRAPES) IMPLANT
CUFF TOURN SGL QUICK 18X4 (TOURNIQUET CUFF) ×3 IMPLANT
DECANTER SPIKE VIAL GLASS SM (MISCELLANEOUS) IMPLANT
DRAPE EXTREMITY T 121X128X90 (DISPOSABLE) ×3 IMPLANT
DRAPE IMP U-DRAPE 54X76 (DRAPES) ×3 IMPLANT
DRAPE OEC MINIVIEW 54X84 (DRAPES) ×3 IMPLANT
DRAPE SURG 17X23 STRL (DRAPES) ×3 IMPLANT
DURAPREP 26ML APPLICATOR (WOUND CARE) IMPLANT
GAUZE SPONGE 4X4 12PLY STRL (GAUZE/BANDAGES/DRESSINGS) ×3 IMPLANT
GLOVE BIO SURGEON STRL SZ8 (GLOVE) ×3 IMPLANT
GLOVE BIOGEL PI IND STRL 8 (GLOVE) ×2 IMPLANT
GLOVE BIOGEL PI INDICATOR 8 (GLOVE) ×4
GLOVE ORTHO TXT STRL SZ7.5 (GLOVE) ×3 IMPLANT
GOWN STRL REUS W/ TWL LRG LVL3 (GOWN DISPOSABLE) ×1 IMPLANT
GOWN STRL REUS W/ TWL XL LVL3 (GOWN DISPOSABLE) ×2 IMPLANT
GOWN STRL REUS W/TWL LRG LVL3 (GOWN DISPOSABLE) ×2
GOWN STRL REUS W/TWL XL LVL3 (GOWN DISPOSABLE) ×4
K-WIRE 1.6 (WIRE) ×4
K-WIRE FX5X1.6XNS BN SS (WIRE) ×2
KWIRE FX5X1.6XNS BN SS (WIRE) ×2 IMPLANT
NEEDLE HYPO 25X1 1.5 SAFETY (NEEDLE) IMPLANT
NS IRRIG 1000ML POUR BTL (IV SOLUTION) ×3 IMPLANT
PACK BASIN DAY SURGERY FS (CUSTOM PROCEDURE TRAY) ×3 IMPLANT
PAD CAST 3X4 CTTN HI CHSV (CAST SUPPLIES) ×1 IMPLANT
PAD CAST 4YDX4 CTTN HI CHSV (CAST SUPPLIES) ×1 IMPLANT
PADDING CAST ABS 4INX4YD NS (CAST SUPPLIES) ×2
PADDING CAST ABS COTTON 4X4 ST (CAST SUPPLIES) ×1 IMPLANT
PADDING CAST COTTON 3X4 STRL (CAST SUPPLIES) ×2
PADDING CAST COTTON 4X4 STRL (CAST SUPPLIES) ×2
PEG LOCKING SMOOTH 2.2X14 (Peg) ×3 IMPLANT
PEG LOCKING SMOOTH 2.2X18 (Peg) ×12 IMPLANT
PEG LOCKING SMOOTH 2.2X20 (Screw) ×3 IMPLANT
PLATE NARROW DVR LEFT (Plate) ×3 IMPLANT
SCREW LOCK 14X2.7X 3 LD TPR (Screw) ×1 IMPLANT
SCREW LOCKING 2.7X13MM (Screw) ×3 IMPLANT
SCREW LOCKING 2.7X14 (Screw) ×2 IMPLANT
SLEEVE SCD COMPRESS KNEE MED (MISCELLANEOUS) ×3 IMPLANT
SLING ARM FOAM STRAP LRG (SOFTGOODS) ×3 IMPLANT
SPLINT PLASTER CAST XFAST 3X15 (CAST SUPPLIES) IMPLANT
SPLINT PLASTER CAST XFAST 4X15 (CAST SUPPLIES) ×8 IMPLANT
SPLINT PLASTER XTRA FAST SET 4 (CAST SUPPLIES) ×16
SPLINT PLASTER XTRA FASTSET 3X (CAST SUPPLIES)
STRIP CLOSURE SKIN 1/4X4 (GAUZE/BANDAGES/DRESSINGS) ×2 IMPLANT
SUCTION FRAZIER HANDLE 10FR (MISCELLANEOUS) ×2
SUCTION TUBE FRAZIER 10FR DISP (MISCELLANEOUS) ×1 IMPLANT
SUT ETHILON 3 0 PS 1 (SUTURE) IMPLANT
SUT ETHILON 4 0 PS 2 18 (SUTURE) IMPLANT
SUT MNCRL AB 4-0 PS2 18 (SUTURE) IMPLANT
SUT VIC AB 0 CT1 27 (SUTURE)
SUT VIC AB 0 CT1 27XBRD ANBCTR (SUTURE) IMPLANT
SUT VICRYL 3-0 CR8 SH (SUTURE) ×3 IMPLANT
SYR BULB 3OZ (MISCELLANEOUS) ×3 IMPLANT
SYR CONTROL 10ML LL (SYRINGE) IMPLANT
TOWEL GREEN STERILE FF (TOWEL DISPOSABLE) ×3 IMPLANT
TUBE CONNECTING 20'X1/4 (TUBING) ×1
TUBE CONNECTING 20X1/4 (TUBING) ×2 IMPLANT
UNDERPAD 30X30 (UNDERPADS AND DIAPERS) ×3 IMPLANT

## 2018-10-25 NOTE — Transfer of Care (Signed)
Immediate Anesthesia Transfer of Care Note  Patient: Tiffany Murphy  Procedure(s) Performed: OPEN REDUCTION INTERNAL FIXATION (ORIF)LEFT  DISTAL RADIAL FRACTURE (Left Wrist)  Patient Location: PACU  Anesthesia Type:MAC combined with regional for post-op pain  Level of Consciousness: awake, alert , oriented and patient cooperative  Airway & Oxygen Therapy: Patient Spontanous Breathing and Patient connected to face mask oxygen  Post-op Assessment: Report given to RN and Post -op Vital signs reviewed and stable  Post vital signs: Reviewed and stable  Last Vitals:  Vitals Value Taken Time  BP    Temp    Pulse 62 10/25/2018 12:57 PM  Resp    SpO2 99 % 10/25/2018 12:57 PM  Vitals shown include unvalidated device data.  Last Pain:  Vitals:   10/25/18 1030  TempSrc: Oral  PainSc: 5       Patients Stated Pain Goal: 4 (72/82/06 0156)  Complications: No apparent anesthesia complications

## 2018-10-25 NOTE — Anesthesia Procedure Notes (Signed)
Anesthesia Regional Block: Supraclavicular block   Pre-Anesthetic Checklist: ,, timeout performed, Correct Patient, Correct Site, Correct Laterality, Correct Procedure, Correct Position, site marked, Risks and benefits discussed,  Surgical consent,  Pre-op evaluation,  At surgeon's request and post-op pain management  Laterality: Left  Prep: chloraprep       Needles:  Injection technique: Single-shot  Needle Type: Stimiplex     Needle Length: 9cm  Needle Gauge: 21     Additional Needles:   Procedures:,,,, ultrasound used (permanent image in chart),,,,  Narrative:  Start time: 10/25/2018 11:23 AM End time: 10/25/2018 11:28 AM Injection made incrementally with aspirations every 5 mL.  Performed by: Personally  Anesthesiologist: Lynda Rainwater, MD

## 2018-10-25 NOTE — Anesthesia Procedure Notes (Signed)
Procedure Name: MAC Date/Time: 10/25/2018 12:58 PM Performed by: Signe Colt, CRNA Pre-anesthesia Checklist: Patient identified, Emergency Drugs available, Suction available, Patient being monitored and Timeout performed Patient Re-evaluated:Patient Re-evaluated prior to induction Oxygen Delivery Method: Simple face mask

## 2018-10-25 NOTE — Anesthesia Preprocedure Evaluation (Addendum)
Anesthesia Evaluation  Patient identified by MRN, date of birth, ID band Patient awake    Reviewed: Allergy & Precautions, NPO status , Patient's Chart, lab work & pertinent test results  Airway Mallampati: II  TM Distance: >3 FB Neck ROM: Full    Dental  (+) Edentulous Upper, Edentulous Lower   Pulmonary asthma , sleep apnea , COPD, Current Smoker,    Pulmonary exam normal breath sounds clear to auscultation       Cardiovascular hypertension, Pt. on medications negative cardio ROS Normal cardiovascular exam Rhythm:Regular Rate:Normal     Neuro/Psych Anxiety Depression negative neurological ROS  negative psych ROS   GI/Hepatic Neg liver ROS, GERD  ,  Endo/Other  negative endocrine ROS  Renal/GU negative Renal ROS  negative genitourinary   Musculoskeletal negative musculoskeletal ROS (+)   Abdominal (+) + obese,   Peds negative pediatric ROS (+)  Hematology negative hematology ROS (+)   Anesthesia Other Findings   Reproductive/Obstetrics negative OB ROS                            Anesthesia Physical Anesthesia Plan  ASA: II  Anesthesia Plan: MAC   Post-op Pain Management:  Regional for Post-op pain   Induction: Intravenous  PONV Risk Score and Plan: 2 and Ondansetron and Midazolam  Airway Management Planned: LMA  Additional Equipment:   Intra-op Plan:   Post-operative Plan: Extubation in OR  Informed Consent: I have reviewed the patients History and Physical, chart, labs and discussed the procedure including the risks, benefits and alternatives for the proposed anesthesia with the patient or authorized representative who has indicated his/her understanding and acceptance.     Dental advisory given  Plan Discussed with: CRNA  Anesthesia Plan Comments:        Anesthesia Quick Evaluation

## 2018-10-25 NOTE — Anesthesia Postprocedure Evaluation (Signed)
Anesthesia Post Note  Patient: Tiffany Murphy  Procedure(s) Performed: OPEN REDUCTION INTERNAL FIXATION (ORIF)LEFT  DISTAL RADIAL FRACTURE (Left Wrist)     Patient location during evaluation: PACU Anesthesia Type: General Level of consciousness: awake and alert Pain management: pain level controlled Vital Signs Assessment: post-procedure vital signs reviewed and stable Respiratory status: spontaneous breathing, nonlabored ventilation and respiratory function stable Cardiovascular status: blood pressure returned to baseline and stable Postop Assessment: no apparent nausea or vomiting Anesthetic complications: no    Last Vitals:  Vitals:   10/25/18 1326 10/25/18 1400  BP:  115/60  Pulse: (!) 59 64  Resp: 15 16  Temp:  36.8 C  SpO2: 94% 95%    Last Pain:  Vitals:   10/25/18 1400  TempSrc:   PainSc: 0-No pain                 Lynda Rainwater

## 2018-10-25 NOTE — Progress Notes (Signed)
Assisted Dr. Miller with left, ultrasound guided, supraclavicular block. Side rails up, monitors on throughout procedure. See vital signs in flow sheet. Tolerated Procedure well. 
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
## 2018-10-25 NOTE — Discharge Instructions (Signed)
Regional Anesthesia Blocks  1. Numbness or the inability to move the "blocked" extremity may last from 3-48 hours after placement. The length of time depends on the medication injected and your individual response to the medication. If the numbness is not going away after 48 hours, call your surgeon.  2. The extremity that is blocked will need to be protected until the numbness is gone and the  Strength has returned. Because you cannot feel it, you will need to take extra care to avoid injury. Because it may be weak, you may have difficulty moving it or using it. You may not know what position it is in without looking at it while the block is in effect.  3. For blocks in the legs and feet, returning to weight bearing and walking needs to be done carefully. You will need to wait until the numbness is entirely gone and the strength has returned. You should be able to move your leg and foot normally before you try and bear weight or walk. You will need someone to be with you when you first try to ensure you do not fall and possibly risk injury.  4. Bruising and tenderness at the needle site are common side effects and will resolve in a few days.  5. Persistent numbness or new problems with movement should be communicated to the surgeon or the [facility] [phone] [place] [phone]).  Post Anesthesia Home Care Instructions  Activity: Get plenty of rest for the remainder of the day. A responsible individual must stay with you for 24 hours following the procedure.  For the next 24 hours, DO NOT: -Drive a car -Paediatric nurse -Drink alcoholic beverages -Take any medication unless instructed by your physician -Make any legal decisions or sign important papers.  Meals: Start with liquid foods such as gelatin or soup. Progress to regular foods as tolerated. Avoid greasy, spicy, heavy foods. If nausea and/or vomiting occur, drink only clear liquids until the  nausea and/or vomiting subsides. Call your physician if vomiting continues.  Special Instructions/Symptoms: Your throat may feel dry or sore from the anesthesia or the breathing tube placed in your throat during surgery. If this causes discomfort, gargle with warm salt water. The discomfort should disappear within 24 hours.  If you had a scopolamine patch placed behind your ear for the management of post- operative nausea and/or vomiting:  1. The medication in the patch is effective for 72 hours, after which it should be removed.  Wrap patch in a tissue and discard in the trash. Wash hands thoroughly with soap and water. 2. You may remove the patch earlier than 72 hours if you experience unpleasant side effects which may include dry mouth, dizziness or visual disturbances. 3. Avoid touching the patch. Wash your hands with soap and water after contact with the patch.      Diet: As you were doing prior to hospitalization   Shower:  May shower but keep the wounds dry, use an occlusive plastic wrap, NO SOAKING IN TUB.  If the bandage gets wet, change with a clean dry gauze.  If you have a splint on, leave the splint in place and keep the splint dry with a plastic bag.  Dressing:  You may change your dressing 3-5 days after surgery, unless you have a splint.  If you have a splint, then just leave the splint in place and we will change your bandages during your first follow-up appointment.    If  you had hand or foot surgery, we will plan to remove your stitches in about 2 weeks in the office.  For all other surgeries, there are sticky tapes (steri-strips) on your wounds and all the stitches are absorbable.  Leave the steri-strips in place when changing your dressings, they will peel off with time, usually 2-3 weeks.  Activity:  Increase activity slowly as tolerated, but follow the weight bearing instructions below.  The rules on driving is that you can not be taking narcotics while you drive, and you  must feel in control of the vehicle.    Weight Bearing:  Elevate, no lifting with left hand.    To prevent constipation: you may use a stool softener such as -  Colace (over the counter) 100 mg by mouth twice a day  Drink plenty of fluids (prune juice may be helpful) and high fiber foods Miralax (over the counter) for constipation as needed.    Itching:  If you experience itching with your medications, try taking only a single pain pill, or even half a pain pill at a time.  You may take up to 10 pain pills per day, and you can also use benadryl over the counter for itching or also to help with sleep.   Precautions:  If you experience chest pain or shortness of breath - call 911 immediately for transfer to the hospital emergency department!!  If you develop a fever greater that 101 F, purulent drainage from wound, increased redness or drainage from wound, or calf pain -- Call the office at [phone]                                                Follow- Up Appointment:  Please call for an appointment to be seen in 2 weeks [place] - [phone]

## 2018-10-25 NOTE — Op Note (Signed)
10/25/2018  1:05 PM  PATIENT:  Tiffany Murphy    PRE-OPERATIVE DIAGNOSIS:  LEFT DISTAL RADIUS FRACTURE, intra-articular split  POST-OPERATIVE DIAGNOSIS:  Same  PROCEDURE:  ORIF DISTAL RADIUS FRACTURE, 3 PIECES  SURGEON:  Johnny Bridge, MD  PHYSICIAN ASSISTANT: Joya Gaskins, OPA-C, present and scrubbed throughout the case, critical for completion in a timely fashion, and for retraction, instrumentation, and closure.  ANESTHESIA:   Regional with MAC  ESTIMATED BLOOD LOSS: 2m  PREOPERATIVE INDICATIONS:  GScotlyn Mccranieis a  68y.o. female with a diagnosis of LEFT DISTAL RADIUS FRACTURE who elected for surgical management due to fracture displacement.    The risks benefits and alternatives were discussed with the patient preoperatively including but not limited to the risks of infection, bleeding, nerve injury, cardiopulmonary complications, the need for revision surgery, tendon rupture, hardware prominence, hardware failure, nonunion, malunion, post-traumatic arthritis, regional pain syndrome, among others, and the patient was willing to proceed.  OPERATIVE IMPLANTS: Biomet DVR volar plate with 2 proximal cortical screws and multiple distal interlocking smooth pegs, using the standard narrow plate.   OPERATIVE FINDINGS: Comminution of the distal radius fracture.  OPERATIVE PROCEDURE: The patient was brought to the operating room and placed in the supine position. General anesthesia was administered. IV antibiotics were given. Time out was performed. The upper extremity was prepped and draped in usual sterile fashion. The arm was elevated and exsanguinated and the tourniquet was inflated at 2555mhg.    Volar approach to the distal radius was carried out, and the flexor carpi radialis was retracted radially. The radial artery was protected throughout the case.  Deep dissection was carried down, and the pronator quadratus was elevated off of the radius. The fracture site was identified and  cleaned and reduced anatomically. This keyed into place nicely.   I held this provisionally with a K wire, and C-arm used to confirm alignment.  I had restored height and inclination and then applied a volar plate. A K wire was used to confirm appropriate position of the plate, and once I was satisfied with the overall alignment I was able to secure the plate proximally with a cortical screw.   I then secured the fracture with multiple smooth interlocking pegs distally, and confirmed that none of these were in the joint, and none of these were penetrating the dorsal cortex. I also secured the plate proximally with one more cortical screw. On the ulnar side of the plate distally I used shorter pegs than on the radial side.    The wounds were irrigated copiously, followed by 3-0 subcutaneous Vicryl for the skin and Steri-Strips and sterile gauze and a volar splint. The tourniquet was released. She was awakened and returned back in stable and satisfactory condition. There were no complications and She tolerated the procedure well.

## 2018-10-26 ENCOUNTER — Other Ambulatory Visit: Payer: Self-pay | Admitting: Pulmonary Disease

## 2018-10-26 ENCOUNTER — Encounter (HOSPITAL_BASED_OUTPATIENT_CLINIC_OR_DEPARTMENT_OTHER): Payer: Self-pay | Admitting: Orthopedic Surgery

## 2018-11-05 ENCOUNTER — Other Ambulatory Visit: Payer: Self-pay | Admitting: Gastroenterology

## 2018-11-06 ENCOUNTER — Telehealth: Payer: Self-pay

## 2018-11-06 NOTE — Telephone Encounter (Signed)
Refill request for this patient for Bentyl 10mg  tablet. Can I refill this medication and if so can you please tell me the instructions on how the patient should take it and the quantity and the refills please?

## 2018-11-07 ENCOUNTER — Other Ambulatory Visit: Payer: Self-pay

## 2018-11-07 DIAGNOSIS — S52502D Unspecified fracture of the lower end of left radius, subsequent encounter for closed fracture with routine healing: Secondary | ICD-10-CM | POA: Diagnosis not present

## 2018-11-07 MED ORDER — DICYCLOMINE HCL 10 MG PO CAPS: 10.0000 mg | ORAL_CAPSULE | Freq: Two times a day (BID) | ORAL | 6 refills | Status: DC

## 2018-11-07 NOTE — Telephone Encounter (Signed)
Lets do Bentyl 10 mg p.o. twice daily, 60, 6 refills

## 2018-11-07 NOTE — Telephone Encounter (Signed)
Rx refilled for Bentyl 10mg   2 times daily with #60 and 6 refills sent to pharmacy

## 2018-11-13 ENCOUNTER — Other Ambulatory Visit: Payer: Self-pay

## 2018-11-13 ENCOUNTER — Encounter: Payer: Self-pay | Admitting: Family Medicine

## 2018-11-13 ENCOUNTER — Ambulatory Visit (INDEPENDENT_AMBULATORY_CARE_PROVIDER_SITE_OTHER): Payer: Medicare HMO | Admitting: Family Medicine

## 2018-11-13 VITALS — Ht 62.0 in | Wt 192.0 lb

## 2018-11-13 DIAGNOSIS — C4492 Squamous cell carcinoma of skin, unspecified: Secondary | ICD-10-CM

## 2018-11-13 DIAGNOSIS — E669 Obesity, unspecified: Secondary | ICD-10-CM | POA: Diagnosis not present

## 2018-11-13 DIAGNOSIS — E785 Hyperlipidemia, unspecified: Secondary | ICD-10-CM | POA: Diagnosis not present

## 2018-11-13 DIAGNOSIS — E559 Vitamin D deficiency, unspecified: Secondary | ICD-10-CM

## 2018-11-13 DIAGNOSIS — I10 Essential (primary) hypertension: Secondary | ICD-10-CM | POA: Diagnosis not present

## 2018-11-13 DIAGNOSIS — R7302 Impaired glucose tolerance (oral): Secondary | ICD-10-CM | POA: Diagnosis not present

## 2018-11-13 DIAGNOSIS — F39 Unspecified mood [affective] disorder: Secondary | ICD-10-CM

## 2018-11-13 DIAGNOSIS — E782 Mixed hyperlipidemia: Secondary | ICD-10-CM | POA: Insufficient documentation

## 2018-11-13 DIAGNOSIS — L570 Actinic keratosis: Secondary | ICD-10-CM | POA: Diagnosis not present

## 2018-11-13 DIAGNOSIS — X32XXXA Exposure to sunlight, initial encounter: Secondary | ICD-10-CM

## 2018-11-13 DIAGNOSIS — E876 Hypokalemia: Secondary | ICD-10-CM

## 2018-11-13 MED ORDER — POTASSIUM CHLORIDE CRYS ER 20 MEQ PO TBCR: 20.0000 meq | EXTENDED_RELEASE_TABLET | Freq: Two times a day (BID) | ORAL | 1 refills | Status: DC

## 2018-11-13 MED ORDER — LISINOPRIL-HYDROCHLOROTHIAZIDE 20-25 MG PO TABS: ORAL_TABLET | ORAL | 1 refills | Status: DC

## 2018-11-13 MED ORDER — ROSUVASTATIN CALCIUM 10 MG PO TABS: 10.0000 mg | ORAL_TABLET | Freq: Every day | ORAL | 1 refills | Status: DC

## 2018-11-13 NOTE — Progress Notes (Signed)
Telehealth office visit note for Tiffany Murphy, D.O- at Primary Care at [facility]   I connected with current patient today and verified that I am speaking with the correct person using two identifiers.   . Location of the patient: Home . Location of the provider: Office Only the patient (+/- their family members at pt's discretion) and myself were participating in the encounter    - This visit type was conducted due to national recommendations for restrictions regarding the COVID-19 Pandemic (e.g. social distancing) in an effort to limit this patient's exposure and mitigate transmission in our community.  This format is felt to be most appropriate for this patient at this time.   - The patient did not have access to video technology or had technical difficulties with video requiring transitioning to audio format only. - No physical exam could be performed with this format, beyond that communicated to [place] by the patient/ family members as noted.   - Additionally my office staff/ schedulers discussed with the patient that there may be a monetary charge related to this service, depending on their medical insurance.   The patient expressed understanding, and agreed to proceed.       History of Present Illness:  07/12/18- last appt.   Dr. Lyndel Safe of GI, Dr. Ander Slade of pulmonology, Dr Haskell Riling of ortho- for her chronic pains and Dr. Michele Mcalpine for dermatology.  Had an ORIF L distal radius early May- 10/25/18- Dr Mardelle Matte  HTN: bp at home- 117/80;  125/70 once q 2 wks.  HLD:  60mo ago-  LDL was 69 HDL- 50  PRe-DM:  a1c  Last time 52mo ago was 6.2 on 04/26/18  Vit D Def:  Pt states that her insurance didn't cover it- the prescription one.  5K IU once daily  Mood:  Doing well. "Nothing new"  Current Smoker w COPD: down to taking 6 cig/day- just can't  Insomnia- trouble falling and staying asleep.  Patient did not increase her gabapentin nighttime dose for the restless leg concerns and  difficulty sleeping.  Muscle cramps - has essentially disappeared since she is drinking more water as we discussed last office visit.     Impression and Recommendations:    1. Hyperlipidemia with target LDL less than 70   2. Benign essential HTN   3. Hypokalemia   4. Glucose intolerance (impaired glucose tolerance)   5. Vitamin D deficiency   6. Obesity, Class I, BMI 30-34.9   7. Mood disorder ([place])   8. Solar keratosis   9. SCC (squamous cell carcinoma)     -  - As part of my medical decision making, I reviewed the following data within the [place] History obtained from pt /family, CMA notes reviewed and incorporated if applicable, Labs reviewed, Radiograph/ tests reviewed if applicable and OV notes from prior OV's with me, as well as other specialists she/he has seen since seeing me last, were all reviewed and used in my medical decision making process today.   - Additionally, discussion had with patient regarding txmnt plan, and their biases/concerns about that plan were used in my medical decision making today.   - The patient agreed with the plan and demonstrated an understanding of the instructions.   No barriers to understanding were identified.   - Red flag symptoms and signs discussed in detail.  Patient expressed understanding regarding what to do in case of emergency\ urgent symptoms.  The patient was advised to call  back or seek an in-person evaluation if the symptoms worsen or if the condition fails to improve as anticipated.   Return for vit D, A1c, potassium near future- lab only; 2)  4-80mo chronic care.    Orders Placed This Encounter  Procedures  . Ambulatory referral to Dermatology    Meds ordered this encounter  Medications  . lisinopril-hydrochlorothiazide (ZESTORETIC) 20-25 MG tablet    Sig: TAKE ONE TABLET BY MOUTH ONCE DAILY    Dispense:  90 tablet    Refill:  1  . potassium chloride SA (K-DUR) 20 MEQ tablet    Sig: Take 1 tablet (20  mEq total) by mouth 2 (two) times daily.    Dispense:  180 tablet    Refill:  1    Needs video office visit for further refills  . rosuvastatin (CRESTOR) 10 MG tablet    Sig: Take 1 tablet (10 mg total) by mouth daily.    Dispense:  90 tablet    Refill:  1    Medications Discontinued During This Encounter  Medication Reason  . dicyclomine (BENTYL) 10 MG capsule   . sennosides-docusate sodium (SENOKOT-S) 8.6-50 MG tablet Completed Course  . HYDROcodone-acetaminophen (NORCO) 10-325 MG tablet Completed Course  . lisinopril-hydrochlorothiazide (PRINZIDE,ZESTORETIC) 20-25 MG tablet Reorder  . potassium chloride SA (K-DUR) 20 MEQ tablet Reorder  . rosuvastatin (CRESTOR) 10 MG tablet Reorder      I provided 23 minutes of non-face-to-face time during this encounter,with over 50% of the time in direct counseling on patients medical conditions/ medical concerns.  Additional time was spent with charting and coordination of care after the actual visit commenced.   Note:  This note was prepared with assistance of Dragon voice recognition software. Occasional wrong-word or sound-a-like substitutions may have occurred due to the inherent limitations of voice recognition software.  Tiffany Dance, DO     Patient Care Team    Relationship Specialty Notifications Start End  Tiffany Dance, DO PCP - General Family Medicine  04/11/18   Minus Breeding, MD Consulting Physician Cardiology  04/11/18   Jackquline Denmark, MD Consulting Physician Gastroenterology  04/11/18   Jolene Schimke    04/11/18   Yvone Neu, MD Referring Physician Family Medicine  04/11/18   Laurin Coder, MD Consulting Physician Pulmonary Disease  07/12/18   Marchia Bond, MD Consulting Physician Orthopedic Surgery  11/13/18   Laurin Coder, MD Consulting Physician Pulmonary Disease  11/13/18 11/13/18     -Vitals obtained; medications/ allergies reconciled;  personal medical, social, Sx etc.histories were  updated by CMA, reviewed by me and are reflected in chart   Patient Active Problem List   Diagnosis Date Noted  . Hyperlipidemia with target LDL less than 70 11/13/2018    Priority: High  . Chronic obstructive pulmonary disease ([place]) 04/11/2018    Priority: High  . Glucose intolerance (impaired glucose tolerance) 04/11/2018    Priority: High  . Mood disorder ([place]) 04/11/2018    Priority: High  . Benign essential HTN 07/11/2013    Priority: High  . Tobacco abuse counseling 07/12/2018    Priority: Medium  . OSA and COPD overlap syndrome ([place]) 04/30/2018    Priority: Medium  . Tobacco use disorder 04/11/2018    Priority: Medium  . GERD (gastroesophageal reflux disease) 04/11/2018    Priority: Medium  . Obesity, Class I, BMI 30-34.9 04/11/2018    Priority: Medium  . Sleep difficulties- poor sleep hygeine 07/12/2018    Priority: Low  . Vitamin  D deficiency 04/30/2018    Priority: Low  . Panic attacks 04/11/2018    Priority: Low  . Hypokalemia 04/11/2018    Priority: Low  . Environmental and seasonal allergies 04/11/2018    Priority: Low  . Closed fracture of left distal radius 10/25/2018  . Chronic pain disorder 07/12/2018  . Chronic joint pain 07/12/2018  . Chronic fatigue 04/30/2018  . Lung nodule seen on imaging study 04/30/2018  . Elevated LDL cholesterol level 04/11/2018  . Asthma 04/11/2018  . Irritable bowel syndrome 04/11/2018  . Chronic bilateral upper abdominal pain 04/11/2018  . Elevated coronary artery calcium score 12/01/2014  . Depression with anxiety 07/11/2013  . Pure hypercholesterolemia 07/11/2013  . Nicotine addiction 06/15/2013  . Overweight 06/15/2013     Current Meds  Medication Sig  . acyclovir (ZOVIRAX) 200 MG capsule Take 1 capsule by mouth daily.  Marland Kitchen albuterol (PROVENTIL HFA;VENTOLIN HFA) 108 (90 Base) MCG/ACT inhaler Inhale 2 puffs into the lungs every 6 (six) hours as needed for wheezing.  Marland Kitchen buPROPion (WELLBUTRIN SR) 150 MG 12 hr tablet  TAKE 1 TABLET BY MOUTH ONCE DAILY FOR 3 DAYS THEN 1 TAB TWICE DAILY  . dicyclomine (BENTYL) 10 MG capsule Take 1 capsule by mouth 4 times daily  . fluticasone (FLONASE) 50 MCG/ACT nasal spray Place 2 sprays into both nostrils daily.  . Fluticasone-Umeclidin-Vilant (TRELEGY ELLIPTA) 100-62.5-25 MCG/INH AEPB Inhale 1 puff into the lungs daily. (Patient taking differently: Inhale 1 puff into the lungs daily. )  . gabapentin (NEURONTIN) 300 MG capsule 1 tab in am, 1 in afternoon and 2 po q hs  . lisinopril-hydrochlorothiazide (ZESTORETIC) 20-25 MG tablet TAKE ONE TABLET BY MOUTH ONCE DAILY  . montelukast (SINGULAIR) 10 MG tablet TAKE 1 TABLET BY MOUTH AT BEDTIME  . omeprazole (PRILOSEC) 40 MG capsule Take 1 capsule (40 mg total) by mouth daily.  . potassium chloride SA (K-DUR) 20 MEQ tablet Take 1 tablet (20 mEq total) by mouth 2 (two) times daily.  . rosuvastatin (CRESTOR) 10 MG tablet Take 1 tablet (10 mg total) by mouth daily.  . sertraline (ZOLOFT) 100 MG tablet Take 2 tablets by mouth once daily  . [DISCONTINUED] lisinopril-hydrochlorothiazide (PRINZIDE,ZESTORETIC) 20-25 MG tablet TAKE ONE TABLET BY MOUTH ONCE DAILY "OV NEEDED FOR ADDITIONAL VISITS"  . [DISCONTINUED] potassium chloride SA (K-DUR) 20 MEQ tablet Take 1 tablet by mouth twice daily  . [DISCONTINUED] rosuvastatin (CRESTOR) 10 MG tablet Take 10 mg by mouth daily.     Allergies:  Allergies  Allergen Reactions  . Penicillins Hives and Shortness Of Breath  . Lovastatin     Cramps in legs and feet  . Other     Fragrances in soaps, perfumes, shampoos, conditioners     ROS:  See above HPI for pertinent positives and negatives   Objective:   Height 5\' 2"  (1.575 m), weight 192 lb (87.1 kg).  (if some vitals are omitted, this means that patient was UNABLE to obtain them even though they were asked to get them prior to OV today.  They were asked to call us at their earliest convenience with these once obtained. )  General: A & O  * 3; sounds in no acute distress; in usual state of health.  Skin: Pt confirms warm and dry extremities and pink fingertips HEENT: Pt confirms lips non-cyanotic Chest: Patient confirms normal chest excursion and movement Respiratory: speaking in full sentences, no conversational dyspnea; patient confirms no use of accessory muscles Psych: insight appears good, mood- appears full

## 2018-11-14 DIAGNOSIS — S52502D Unspecified fracture of the lower end of left radius, subsequent encounter for closed fracture with routine healing: Secondary | ICD-10-CM | POA: Diagnosis not present

## 2018-11-27 ENCOUNTER — Other Ambulatory Visit: Payer: Self-pay | Admitting: Family Medicine

## 2018-11-27 ENCOUNTER — Other Ambulatory Visit: Payer: Self-pay | Admitting: Pulmonary Disease

## 2018-11-28 DIAGNOSIS — S52502D Unspecified fracture of the lower end of left radius, subsequent encounter for closed fracture with routine healing: Secondary | ICD-10-CM | POA: Diagnosis not present

## 2018-11-28 NOTE — Telephone Encounter (Signed)
Refill request received for Wellbutrin 150mg  tablet. Last time pt's med was refilled was 10/29/2018 #63 tabs with 0 RF. This was authorized by Dr. Ander Slade.  Pt last seen at office 07/30/2018. Sarah, please advise if you are okay refilling med for pt. Thanks!

## 2018-12-11 ENCOUNTER — Other Ambulatory Visit: Payer: Self-pay

## 2018-12-11 DIAGNOSIS — J45909 Unspecified asthma, uncomplicated: Secondary | ICD-10-CM

## 2018-12-11 DIAGNOSIS — I10 Essential (primary) hypertension: Secondary | ICD-10-CM

## 2018-12-11 DIAGNOSIS — E876 Hypokalemia: Secondary | ICD-10-CM

## 2018-12-11 DIAGNOSIS — G8929 Other chronic pain: Secondary | ICD-10-CM

## 2018-12-11 DIAGNOSIS — G894 Chronic pain syndrome: Secondary | ICD-10-CM

## 2018-12-11 DIAGNOSIS — M2550 Pain in unspecified joint: Secondary | ICD-10-CM

## 2018-12-11 DIAGNOSIS — J3089 Other allergic rhinitis: Secondary | ICD-10-CM

## 2018-12-11 DIAGNOSIS — E785 Hyperlipidemia, unspecified: Secondary | ICD-10-CM

## 2018-12-11 MED ORDER — ROSUVASTATIN CALCIUM 10 MG PO TABS: 10.0000 mg | ORAL_TABLET | Freq: Every day | ORAL | 1 refills | Status: DC

## 2018-12-11 MED ORDER — GABAPENTIN 300 MG PO CAPS: ORAL_CAPSULE | ORAL | 0 refills | Status: DC

## 2018-12-11 MED ORDER — OMEPRAZOLE 40 MG PO CPDR: 40.0000 mg | DELAYED_RELEASE_CAPSULE | Freq: Every day | ORAL | 0 refills | Status: DC

## 2018-12-11 MED ORDER — POTASSIUM CHLORIDE CRYS ER 20 MEQ PO TBCR: 20.0000 meq | EXTENDED_RELEASE_TABLET | Freq: Two times a day (BID) | ORAL | 0 refills | Status: DC

## 2018-12-11 MED ORDER — LISINOPRIL-HYDROCHLOROTHIAZIDE 20-25 MG PO TABS: ORAL_TABLET | ORAL | 0 refills | Status: DC

## 2018-12-11 MED ORDER — MONTELUKAST SODIUM 10 MG PO TABS: 10.0000 mg | ORAL_TABLET | Freq: Every day | ORAL | 0 refills | Status: DC

## 2018-12-11 MED ORDER — FLUTICASONE PROPIONATE 50 MCG/ACT NA SUSP: 2.0000 | Freq: Every day | NASAL | 1 refills | Status: DC

## 2018-12-11 MED ORDER — SERTRALINE HCL 100 MG PO TABS: 200.0000 mg | ORAL_TABLET | Freq: Every day | ORAL | 0 refills | Status: DC

## 2018-12-17 ENCOUNTER — Telehealth: Payer: Self-pay | Admitting: Pulmonary Disease

## 2018-12-17 MED ORDER — TRELEGY ELLIPTA 100-62.5-25 MCG/INH IN AEPB: 1.0000 | INHALATION_SPRAY | Freq: Every day | RESPIRATORY_TRACT | 3 refills | Status: DC

## 2018-12-17 MED ORDER — BUPROPION HCL ER (SR) 150 MG PO TB12: 150.0000 mg | ORAL_TABLET | Freq: Two times a day (BID) | ORAL | 0 refills | Status: DC

## 2018-12-17 NOTE — Telephone Encounter (Signed)
Called [employer] and gave verbal over the phone for pt's wellbutrin as a 90 day supply for pt to take 1 tablet twice daily. Nothing further needed.

## 2018-12-17 NOTE — Telephone Encounter (Signed)
I have sent Trelegy Rx to [facility] for pt.   It seems like the original Rx for the wellbutrin was for pt to take 1 tab daily for three days and then for pt to take twice daily after that.  The Rx will need to be updated with the instructions stating for pt to take one tablet twice daily and the quantity will also need to be updated for a 90-day supply as it is being sent to pt's mail order pharmacy.  Dr. Ander Slade, please advise if you are okay with sending refill of Wellbutrin to [facility] for pt and if pt can have any refills of the med. Thank you!

## 2018-12-17 NOTE — Telephone Encounter (Signed)
Wellbutrin can be ordered Take twice a day

## 2019-01-16 ENCOUNTER — Telehealth: Payer: Self-pay | Admitting: Gastroenterology

## 2019-01-16 NOTE — Telephone Encounter (Signed)
Pt needs rf for Bentyl sent to [facility] mail order for 90-day supply, pt states that she was taking med 2 pills BID but for some reason her last rf was for 1 pill BID, she wants to know if Dr. Lyndel Safe decreased the dose.

## 2019-01-21 ENCOUNTER — Other Ambulatory Visit: Payer: Self-pay

## 2019-01-21 MED ORDER — DICYCLOMINE HCL 10 MG PO CAPS: 10.0000 mg | ORAL_CAPSULE | Freq: Two times a day (BID) | ORAL | 3 refills | Status: DC

## 2019-01-21 NOTE — Telephone Encounter (Signed)
Sent refill to patients pharmacy. 

## 2019-01-30 ENCOUNTER — Telehealth: Payer: Self-pay | Admitting: Family Medicine

## 2019-01-30 NOTE — Telephone Encounter (Signed)
Patient called request refill on : Chantix  (do not see that Dr. Jenetta Downer has ever prescribed for patient.  ---Forwarding refill request to medical assistant that if approved to send  Order to :   [facility] ([address] [place] [address] [phone] (Phone) [phone] (Fax)   --glh

## 2019-01-31 NOTE — Telephone Encounter (Signed)
Patient called back - she states that she has been taking a friends Chantix lowest dose and that it has been helping and would like RX sent in for her.  Please review and advise. MPulliam, CMA/RT(R)

## 2019-01-31 NOTE — Telephone Encounter (Signed)
Left message for patient to call. MPulliam, CMA/RT(R)

## 2019-01-31 NOTE — Telephone Encounter (Signed)
Called patient to see if this is a medication that the patient is already taking or if this would be a new medication as it is not listed on her medication list.  Left message for patient to call the office. MPulliam, CMA/RT(R)

## 2019-02-02 NOTE — Telephone Encounter (Signed)
Needs ov in order to discuss starting this new med with me.  Please explain I would like to set her up for success in doing everything we can to prepare her

## 2019-02-04 NOTE — Telephone Encounter (Signed)
Dr. Raliegh Scarlet sent to me over the weekend, can you please call the patient to set up appointment and if patient has clinical questions send call to [place].  Thanks. MPulliam, CMA/RT(R)

## 2019-02-12 ENCOUNTER — Ambulatory Visit: Payer: Medicare HMO | Admitting: Family Medicine

## 2019-02-12 ENCOUNTER — Other Ambulatory Visit: Payer: Self-pay

## 2019-02-21 ENCOUNTER — Encounter: Payer: Self-pay | Admitting: Family Medicine

## 2019-02-21 ENCOUNTER — Other Ambulatory Visit: Payer: Self-pay

## 2019-02-21 ENCOUNTER — Ambulatory Visit (INDEPENDENT_AMBULATORY_CARE_PROVIDER_SITE_OTHER): Payer: Medicare HMO | Admitting: Family Medicine

## 2019-02-21 DIAGNOSIS — R4189 Other symptoms and signs involving cognitive functions and awareness: Secondary | ICD-10-CM | POA: Insufficient documentation

## 2019-02-21 DIAGNOSIS — R350 Frequency of micturition: Secondary | ICD-10-CM | POA: Diagnosis not present

## 2019-02-21 DIAGNOSIS — I10 Essential (primary) hypertension: Secondary | ICD-10-CM | POA: Diagnosis not present

## 2019-02-21 DIAGNOSIS — E785 Hyperlipidemia, unspecified: Secondary | ICD-10-CM | POA: Diagnosis not present

## 2019-02-21 DIAGNOSIS — F39 Unspecified mood [affective] disorder: Secondary | ICD-10-CM | POA: Diagnosis not present

## 2019-02-21 MED ORDER — LOSARTAN POTASSIUM 100 MG PO TABS: 100.0000 mg | ORAL_TABLET | Freq: Every day | ORAL | 0 refills | Status: DC

## 2019-02-21 NOTE — Progress Notes (Signed)
Telehealth office visit note for Tiffany Murphy, D.O- at Primary Care at [facility]   I connected with current patient today and verified that I am speaking with the correct person using two identifiers.   . Location of the patient: Home . Location of the provider: Office Only the patient (+/- their family members at pt's discretion) and myself were participating in the encounter    - This visit type was conducted due to national recommendations for restrictions regarding the COVID-19 Pandemic (e.g. social distancing) in an effort to limit this patient's exposure and mitigate transmission in our community.  This format is felt to be most appropriate for this patient at this time.   - The patient did not have access to video technology or had technical difficulties with video requiring transitioning to audio format only. - No physical exam could be performed with this format, beyond that communicated to [place] by the patient/ family members as noted.   - Additionally my office staff/ schedulers discussed with the patient that there may be a monetary charge related to this service, depending on their medical insurance.   The patient expressed understanding, and agreed to proceed.       History of Present Illness:  States doing good today.  Memory Concerns:  Notes having a "terrible time with her memory" that's "so scary it's unreal."  She's been concerned about her memory for a couple of years, but states it's "getting worse, a lot worse."  Recalls a day she was trying to get to the doctor's office, got lost, had a panic attack and started crying and called the office.  Blood Pressure at Home:  Went to the dentist this morning and notes her blood pressure was 90/51.  Says "they took it with the wrist cuff."  Otherwise, reports her BP at home "about the same as when I went to your office."  Says running in the 120's-130's at home on average.  Says her husband's blood pressure is  running 210/124, and now running in the "220's/100" and states he "won't go to the doctor."  Otherwise states "I've been okay.  I'm having some pee pee problems."  Says "I have to pee too much."  Notes switched her BP medicine to nighttime, "and it doesn't make any difference whatsoever."  Notes she's up all night and going all day, and has even peed herself twice while in public.  The medication she refers to is the lisinopril.  Cholesterol:  Continues taking statin at night.  Breathing:  Says doing good with her inhaler; "I don't use it much unless I'm feeling like I can't breathe."  Mood:  Denies mood concerns.  Was placed on Wellbutrin by her new pulmonary doctor, who was trying to get her to quit smoking.  Is taking both Zoloft (two 100 mg tablets in the morning) and Wellbutrin.    GAD 7 : Generalized Anxiety Score 11/13/2018  Nervous, Anxious, on Edge 2  Control/stop worrying 1  Worry too much - different things 0  Trouble relaxing 2  Restless 3  Easily annoyed or irritable 0  Afraid - awful might happen 1  Total GAD 7 Score 9  Anxiety Difficulty Somewhat difficult    Depression screen [facility] 2/9 02/21/2019 11/13/2018 07/12/2018 07/12/2018 04/30/2018  Decreased Interest 1 0 3 2 3   Down, Depressed, Hopeless 1 1 3 2 3   PHQ - 2 Score 2 1 6 4 6   Altered sleeping 3 2 3  3 3  Tired, decreased energy 3 1 3 3 3   Change in appetite 0 0 3 3 2   Feeling bad or failure about yourself  0 0 - 2 2  Trouble concentrating 3 0 1 0 2  Moving slowly or fidgety/restless 0 3 0 0 0  Suicidal thoughts 0 0 0 0 0  PHQ-9 Score 11 7 16 15 18   Difficult doing work/chores Very difficult Somewhat difficult - Very difficult Extremely dIfficult        Impression and Recommendations:    1. Mood disorder ([place])   2. Hyperlipidemia with target LDL less than 70   3. Benign essential HTN   4. Frequency of urination- pt thinks due to HCTZ BP med   5. Subjective memory complaints      - Last visit 11/13/2018,  told to return in 4-6 months for chronic care.  Mood Disorder - Stable at this time. - Per patient, pulmonology added Wellbutrin to pt regimen.  Patient aware that she is now on 2 mood medicines and she will discuss the appropriateness of this with pulmonology  - Discussed that if patient desires to stop taking zoloft, she MUST wean down instead of stopping cold [place] under our guidance to avoid major side effects.  - Continue treatment plan as established. - Patient tolerating meds well without complication.  Denies S-E.  - In addition to prescription intervention, reviewed the "spokes of the wheel" of mood and health management.  Stressed the importance of ongoing prudent habits, including regular exercise, appropriate sleep hygiene, healthful dietary habits, and prayer/meditation to relax.  - Will continue to monitor.  Subjective Memory Complaints - If patient is having memory concerns, discussed need for detailed memory assessment. - Patient knows to return in near future for evaluation.  - To help improve memory, advised llearning a new language, reading, doing crossword puzzles, learning a new instrument, exercising regularly, and consuming a prudent diet.  - Will continue to monitor.  Hyperlipidemia w/ target LDL less than 70 - Lipid Panel stable last check. - Continue treatment plan as estalished.  See med list below. - Patient tolerating meds well without complication.  Denies S-E  - Need for re-check in near future, around November 2020. - Will continue to monitor.  Benign Essential Hypertension - Pt thinks HCTZ causing Freq of Urination - BP stable at this time on current management.  - However, per pt, she thinks current BP medicine is making her urinate more. - Due to concerns about increased urination, discussed changing BP treatment plan today. - Begin new Rx losartan today; we DCed  hydrochlorothiazide/lisinopril.  See med list. - May adjust to new combination  med in the near future. - If this does not help with the urinary frequency, we will take further action.  - Reviewed goal BP with patient today, 130/80 or less. - Advised patient to use an arm cuff and not a wrist BP cuff. - Ambulatory blood pressure monitoring encouraged every other day.   - Keep log and bring to next OV.  - Will continue to monitor.  Recommendations - In two weeks, call clinic and report to [place] with BP log. - If patient doing well without symptoms, will continue new BP. - Return in November for full fasting lab work and [employer] in-person. - Will obtain in-person memory evaluation.   - As part of my medical decision making, I reviewed the following data within the [place] History obtained from pt /family,  CMA notes reviewed and incorporated if applicable, Labs reviewed, Radiograph/ tests reviewed if applicable and OV notes from prior OV's with me, as well as other specialists she/he has seen since seeing me last, were all reviewed and used in my medical decision making process today.   - Additionally, discussion had with patient regarding txmnt plan, and their biases/concerns about that plan were used in my medical decision making today.   - The patient agreed with the plan and demonstrated an understanding of the instructions.   No barriers to understanding were identified.   - Red flag symptoms and signs discussed in detail.  Patient expressed understanding regarding what to do in case of emergency\ urgent symptoms.  The patient was advised to call back or seek an in-person evaluation if the symptoms worsen or if the condition fails to improve as anticipated.   Return in about 8 weeks (around 04/18/2019), or pt will call with BP's QOD in 2 wks and tell CMA what they are, for Medicare Wellness-needs in person- 4 extended memory eval & FBW with dementia panel .     Meds ordered this encounter  Medications  . losartan (COZAAR) 100 MG tablet     Sig: Take 1 tablet (100 mg total) by mouth daily.    Dispense:  60 tablet    Refill:  0    Medications Discontinued During This Encounter  Medication Reason  . dicyclomine (BENTYL) 10 MG capsule Dose change  . traMADol (ULTRAM) 50 MG tablet No longer needed (for PRN medications)  . lisinopril-hydrochlorothiazide (ZESTORETIC) 20-25 MG tablet      I provided 25+ minutes of non face-to-face time during this encounter.  Additional time was spent with charting and coordination of care after the actual visit commenced.   Note:  This note was prepared with assistance of Dragon voice recognition software. Occasional wrong-word or sound-a-like substitutions may have occurred due to the inherent limitations of voice recognition software.  This document serves as a record of services personally performed by Tiffany Dance, DO. It was created on her behalf by [name], a trained medical scribe. The creation of this record is based on the scribe's personal observations and the provider's statements to them.   I have reviewed the above medical documentation for accuracy and completeness and I concur.  Tiffany Dance, DO 02/21/2019 6:42 PM        Patient Care Team    Relationship Specialty Notifications Start End  Tiffany Dance, DO PCP - General Family Medicine  04/11/18   Minus Breeding, MD Consulting Physician Cardiology  04/11/18   Jackquline Denmark, MD Consulting Physician Gastroenterology  04/11/18   Jolene Schimke    04/11/18   Yvone Neu, MD Referring Physician Family Medicine  04/11/18   Laurin Coder, MD Consulting Physician Pulmonary Disease  07/12/18   Marchia Bond, MD Consulting Physician Orthopedic Surgery  11/13/18      -Vitals obtained; medications/ allergies reconciled;  personal medical, social, Sx etc.histories were updated by CMA, reviewed by me and are reflected in chart   Patient Active Problem List   Diagnosis Date Noted  . Hyperlipidemia  with target LDL less than 70 11/13/2018    Priority: High  . Chronic obstructive pulmonary disease ([place]) 04/11/2018    Priority: High  . Glucose intolerance (impaired glucose tolerance) 04/11/2018    Priority: High  . Mood disorder ([place]) 04/11/2018    Priority: High  . Benign essential HTN 07/11/2013    Priority: High  .  Tobacco abuse counseling 07/12/2018    Priority: Medium  . OSA and COPD overlap syndrome ([place]) 04/30/2018    Priority: Medium  . Tobacco use disorder 04/11/2018    Priority: Medium  . GERD (gastroesophageal reflux disease) 04/11/2018    Priority: Medium  . Obesity, Class I, BMI 30-34.9 04/11/2018    Priority: Medium  . Sleep difficulties- poor sleep hygeine 07/12/2018    Priority: Low  . Vitamin D deficiency 04/30/2018    Priority: Low  . Panic attacks 04/11/2018    Priority: Low  . Hypokalemia 04/11/2018    Priority: Low  . Environmental and seasonal allergies 04/11/2018    Priority: Low  . Subjective memory complaints 02/21/2019  . Frequency of urination- pt thinks due to HCTZ BP med 02/21/2019  . Closed fracture of left distal radius 10/25/2018  . Chronic pain disorder 07/12/2018  . Chronic joint pain 07/12/2018  . Chronic fatigue 04/30/2018  . Lung nodule seen on imaging study 04/30/2018  . Elevated LDL cholesterol level 04/11/2018  . Asthma 04/11/2018  . Irritable bowel syndrome 04/11/2018  . Chronic bilateral upper abdominal pain 04/11/2018  . Elevated coronary artery calcium score 12/01/2014  . Depression with anxiety 07/11/2013  . Pure hypercholesterolemia 07/11/2013  . Nicotine addiction 06/15/2013  . Overweight 06/15/2013     Current Meds  Medication Sig  . acyclovir (ZOVIRAX) 200 MG capsule Take 1 capsule by mouth daily.  Marland Kitchen buPROPion (WELLBUTRIN SR) 150 MG 12 hr tablet Take 1 tablet (150 mg total) by mouth 2 (two) times daily.  Marland Kitchen dicyclomine (BENTYL) 10 MG capsule Take 1 capsule (10 mg total) by mouth 2 (two) times a day. (Patient  taking differently: Take 20 mg by mouth 2 (two) times a day. )  . fluticasone (FLONASE) 50 MCG/ACT nasal spray Place 2 sprays into both nostrils daily.  . Fluticasone-Umeclidin-Vilant (TRELEGY ELLIPTA) 100-62.5-25 MCG/INH AEPB Inhale 1 puff into the lungs daily.  Marland Kitchen gabapentin (NEURONTIN) 300 MG capsule 1 tab in am, 1 in afternoon and 2 po q hs  . montelukast (SINGULAIR) 10 MG tablet Take 1 tablet (10 mg total) by mouth at bedtime.  Marland Kitchen omeprazole (PRILOSEC) 40 MG capsule Take 1 capsule (40 mg total) by mouth daily.  . potassium chloride SA (K-DUR) 20 MEQ tablet Take 1 tablet (20 mEq total) by mouth 2 (two) times daily.  . rosuvastatin (CRESTOR) 10 MG tablet Take 1 tablet (10 mg total) by mouth daily.  . sertraline (ZOLOFT) 100 MG tablet Take 2 tablets (200 mg total) by mouth daily.  . [DISCONTINUED] dicyclomine (BENTYL) 10 MG capsule Take 1 capsule by mouth 4 times daily  . [DISCONTINUED] lisinopril-hydrochlorothiazide (ZESTORETIC) 20-25 MG tablet TAKE ONE TABLET BY MOUTH ONCE DAILY     Allergies:  Allergies  Allergen Reactions  . Penicillins Hives and Shortness Of Breath  . Lovastatin     Cramps in legs and feet  . Other     Fragrances in soaps, perfumes, shampoos, conditioners     ROS:  See above HPI for pertinent positives and negatives   Objective:   There were no vitals taken for this visit.  (if some vitals are omitted, this means that patient was UNABLE to obtain them even though they were asked to get them prior to OV today.  They were asked to call us at their earliest convenience with these once obtained. )  General: A & O * 3; sounds in no acute distress; in usual state of health.  Skin: Pt confirms  warm and dry extremities and pink fingertips HEENT: Pt confirms lips non-cyanotic Chest: Patient confirms normal chest excursion and movement Respiratory: speaking in full sentences, no conversational dyspnea; patient confirms no use of accessory muscles Psych: insight  appears good, mood- appears full

## 2019-02-28 ENCOUNTER — Telehealth: Payer: Self-pay | Admitting: Family Medicine

## 2019-02-28 NOTE — Telephone Encounter (Signed)
Per previous appointment I do not see Chantix mentioned, please look behind me and advise on prescription

## 2019-02-28 NOTE — Telephone Encounter (Signed)
Patient had televisit last week and thought she would have a Chantix prescription sent to [facility] on [address] but it does not seem that is the case. If approved can we please send an order for this?

## 2019-03-01 NOTE — Telephone Encounter (Signed)
I do not see where that was discussed either (especially with having a scribe-usually everything gets written down).  We can discuss her readiness to quit and this medication in a future office visit.    -Since we changed blood pressure medicines- patient was going to call us with her home blood pressures that she was going to check every other day for the next several weeks and then let us know how her home BPs were doing.    However, in lieu of her recent change in blood pressure medicines as well as her concerns about Chantix etc., I recommend she make an office visit follow-up to discuss blood pressure and smoking cessation with me in 4 weeks  Also please tell patient she needs to keep her 8-week follow-up for Medicare wellness which will be in person for an extended memory eval and fasting blood work with dementia panel.    Please educate patient on the difference between a chronic follow-up office visit versus a health maintenance Medicare wellness visit  Thanks!!

## 2019-03-02 ENCOUNTER — Other Ambulatory Visit: Payer: Self-pay | Admitting: Family Medicine

## 2019-03-03 NOTE — Telephone Encounter (Signed)
Recommend pt go to 20mg  daily in order to minimize potential for adverse S-E.  UNLESS, she has had espohagitis, bleeding gastric ulcers etc in which a GI doc told he she needs to be on that higher dose, hence she needs to get higher dose from them.      Try lower dose along with GERD prudent diet- ( she can read online about this) and let's see if we can't get good control of sx with less meds.   If need be, we can add H2 bl to regimen for better control with less chance adverse effects of medicaitons.

## 2019-03-04 NOTE — Telephone Encounter (Signed)
LVM for pt to call to discuss.  T. Nelson, CMA  

## 2019-03-05 NOTE — Telephone Encounter (Signed)
Pt informed.  Pt expressed understanding and is agreeable.  T. Jassiah Viviano, CMA  

## 2019-03-05 NOTE — Telephone Encounter (Signed)
Pt informed.  Pt expressed understanding and is agreeable.  T. Nelson, CMA  

## 2019-03-08 ENCOUNTER — Other Ambulatory Visit: Payer: Self-pay | Admitting: Family Medicine

## 2019-03-11 ENCOUNTER — Telehealth: Payer: Self-pay | Admitting: Family Medicine

## 2019-03-11 NOTE — Telephone Encounter (Signed)
Patient called and gave current BP log records:  02/23/19: 118/67, pulse 86  02/25/19: 120/66, pulse 83  02/27/19: 144/93, pulse 73  03/01/19: 129/72, pulse 74  03/03/19: 120/80, pulse 68  03/05/19: 113/65, pulse 73  03/07/19: 122/70, pulse 66

## 2019-03-11 NOTE — Telephone Encounter (Signed)
LVM for pt to call to discuss.  T. Nelson, CMA  

## 2019-03-11 NOTE — Telephone Encounter (Signed)
See below

## 2019-03-11 NOTE — Telephone Encounter (Signed)
Excellent-   tell patient to continue current medication on the losartan.  This was just started new,  back on 9/3 and apparently, I assume she is doing well with the medicine so when you call her to tell her to continue the current dose of medicine, please confirm that is tolerating it well without side effect and document that.  Thanks very much.

## 2019-03-12 NOTE — Telephone Encounter (Signed)
LVM for pt to call to discuss.  T. Nelson, CMA  

## 2019-03-13 ENCOUNTER — Telehealth: Payer: Self-pay | Admitting: Pulmonary Disease

## 2019-03-13 DIAGNOSIS — Z720 Tobacco use: Secondary | ICD-10-CM

## 2019-03-13 NOTE — Telephone Encounter (Signed)
ATC pt, no answer. Left message for pt to call back.  

## 2019-03-14 NOTE — Telephone Encounter (Signed)
Called spoke with patient and discussed Brain NP's recommendation for referral to the pharmacy team for smoking cessation and potential Chantix start.  Patient okay with this referral and provided dates that she will be available for the next 2 weeks (these have been included in the referral).  Nothing further needed; will sign off.

## 2019-03-14 NOTE — Telephone Encounter (Signed)
Called and spoke with patient. She states she was on Chantix back in her 20-30s and wasn't ready to quit.  She is ready to quit smoking now.   Patient has been taking her friends samples of chantix 5mg  but now is out.   Dr. Ander Slade put patient on Wellbutrin patient states she needs chantix and maybe a little strong dose than 5mg .   Last OV with AO- 07/30/18 has follow up with AO on 05/14/19 in office.   AO is out of office until Wed 9/30, will route to Aaron Edelman to advise  Aaron Edelman please advise on RX for Chantix

## 2019-03-14 NOTE — Telephone Encounter (Signed)
LVM for pt to call to discuss.  T. Nelson, CMA  

## 2019-03-14 NOTE — Telephone Encounter (Signed)
Please get pt scheduled with pharmacy for in office visit for smoking cessation and potential chantix start.   Tiffany Quaker FNP

## 2019-03-18 NOTE — Telephone Encounter (Signed)
Pt has not returned any of our phone calls.  Therefore, encounter closed after 3 attempts.  Charyl Bigger, CMA

## 2019-04-03 ENCOUNTER — Ambulatory Visit: Payer: Medicare HMO | Admitting: Family Medicine

## 2019-04-03 ENCOUNTER — Telehealth: Payer: Self-pay | Admitting: Pulmonary Disease

## 2019-04-03 ENCOUNTER — Other Ambulatory Visit: Payer: Self-pay

## 2019-04-03 MED ORDER — DICYCLOMINE HCL 20 MG PO TABS: 20.0000 mg | ORAL_TABLET | Freq: Two times a day (BID) | ORAL | 3 refills | Status: DC

## 2019-04-03 NOTE — Telephone Encounter (Signed)
If the patient feels this is sufficient that is ok. If she changes her mind and would like to meet with pharmacy she can call back and be scheduled.   Smoking Cessation Resources:  1 800 QUIT NOW  >>> Patient to call this resource and utilize it to help support her quit smoking >>> Keep up your hard work with stopping smoking  You can also contact the [facility] >>>For smoking cessation classes call [phone]  We do not recommend using e-cigarettes as a form of stopping smoking  You can sign up for smoking cessation support texts and information:  >>>https://smokefree.gov/smokefreetxt  Please congratulate the patient for me. Keep up the hard work!  Wyn Quaker FNP

## 2019-04-04 ENCOUNTER — Encounter: Payer: Self-pay | Admitting: Pulmonary Disease

## 2019-04-04 NOTE — Telephone Encounter (Signed)
Left message for patient to call back.   Updated her smoking history with the quit date. Will see if she remembers when she started smoking so the years portion will be updated as well.

## 2019-04-26 ENCOUNTER — Ambulatory Visit: Payer: Medicare HMO | Admitting: Family Medicine

## 2019-04-29 ENCOUNTER — Telehealth: Payer: Self-pay | Admitting: Pulmonary Disease

## 2019-04-29 ENCOUNTER — Other Ambulatory Visit: Payer: Self-pay | Admitting: Family Medicine

## 2019-04-29 ENCOUNTER — Telehealth: Payer: Self-pay | Admitting: Family Medicine

## 2019-04-29 DIAGNOSIS — J45909 Unspecified asthma, uncomplicated: Secondary | ICD-10-CM

## 2019-04-29 DIAGNOSIS — J3089 Other allergic rhinitis: Secondary | ICD-10-CM

## 2019-04-29 DIAGNOSIS — G8929 Other chronic pain: Secondary | ICD-10-CM

## 2019-04-29 DIAGNOSIS — W06XXXD Fall from bed, subsequent encounter: Secondary | ICD-10-CM

## 2019-04-29 DIAGNOSIS — G894 Chronic pain syndrome: Secondary | ICD-10-CM

## 2019-04-29 DIAGNOSIS — M2550 Pain in unspecified joint: Secondary | ICD-10-CM

## 2019-04-29 NOTE — Telephone Encounter (Signed)
Pt called requesting to have her wellbutrin 150mg  refilled for a 90 day supply. Pt is also requesting to have the dose increased.  Med was last filled for pt 12/17/2018 and pt last seen 07/30/2018. Dr. Ander Slade, please advise if it is okay to refill med for pt. Thanks!

## 2019-04-29 NOTE — Telephone Encounter (Signed)
Patient called states she fell again out of the bed & has terrible bruises-- She called [employer] (her Health Ins ) they advised her to have provider order /Rx for DME (aka A bed rail) and told her it would be covered by her ins coverage.  -- Pt also questions why her Rx for Prilosec was reduce from 40MG  to 20 MG, says her acid reflux & heartburn has gotten worst since taking less MG.   ---Forwarding request to medical asst for review w/ provider & to contact patient if there are any questions.  --glh

## 2019-04-30 MED ORDER — OMEPRAZOLE 40 MG PO CPDR: 40.0000 mg | DELAYED_RELEASE_CAPSULE | Freq: Every day | ORAL | 0 refills | Status: DC

## 2019-04-30 NOTE — Addendum Note (Signed)
Addended by: Fonnie Mu on: 04/30/2019 01:26 PM   Modules accepted: Orders

## 2019-04-30 NOTE — Telephone Encounter (Signed)
Yes it is okay to order bed rails for patient.  Thank you

## 2019-04-30 NOTE — Telephone Encounter (Signed)
Please advise if it is okay to order DME for bed rails.  Charyl Bigger, CMA

## 2019-04-30 NOTE — Telephone Encounter (Signed)
Okay to refill Wellbutrin 150 mg twice daily for 90 days

## 2019-04-30 NOTE — Telephone Encounter (Signed)
Advised pt that order for bed rails is ready.  Pt states that she will come to the office to pick it up.  Also reviewed chart for omeprazole and found that she was previously taking 40mg .  There is no documentation of this medication dosage being reduced.  New RX for 40mg  sent to [facility].  Charyl Bigger, CMA

## 2019-04-30 NOTE — Telephone Encounter (Signed)
Dr. Ander Slade, please clarify if you want to increase dose as pt requested or we are keeping wellbutrin 150 bid/ Please advise.

## 2019-05-01 MED ORDER — BUPROPION HCL ER (SR) 150 MG PO TB12: 150.0000 mg | ORAL_TABLET | Freq: Two times a day (BID) | ORAL | 0 refills | Status: DC

## 2019-05-01 NOTE — Telephone Encounter (Signed)
ATC pt, no answer. Left message for pt to call back.  

## 2019-05-01 NOTE — Telephone Encounter (Signed)
Acute Wellbutrin to 150 twice daily

## 2019-05-01 NOTE — Telephone Encounter (Signed)
Rx sent to [facility] pharmacy

## 2019-05-02 ENCOUNTER — Telehealth: Payer: Self-pay | Admitting: Pulmonary Disease

## 2019-05-02 NOTE — Telephone Encounter (Signed)
ATC Patient.  LMTCB for CT results.

## 2019-05-02 NOTE — Telephone Encounter (Signed)
Left a detailed message with the pt making her aware that we refilled her medication.

## 2019-05-03 NOTE — Telephone Encounter (Signed)
Notes recorded by Laurin Coder, MD on 05/01/2019 at 3:35 PM EST  Inform patient   CT scan is stable from previous ---------------------------------- [facility] x2 for pt.

## 2019-05-06 NOTE — Telephone Encounter (Signed)
LMTCB x3 for pt.  

## 2019-05-08 ENCOUNTER — Other Ambulatory Visit: Payer: Self-pay | Admitting: Family Medicine

## 2019-05-08 DIAGNOSIS — E876 Hypokalemia: Secondary | ICD-10-CM

## 2019-05-08 DIAGNOSIS — I10 Essential (primary) hypertension: Secondary | ICD-10-CM

## 2019-05-08 NOTE — Telephone Encounter (Signed)
We have attempted to contact pt several times with no success or call back from pt. Per triage protocol, message will be closed.  

## 2019-05-09 NOTE — Telephone Encounter (Signed)
Per Dr. Jenetta Downer last office note on 02/21/19 Return in about 8 weeks (around 04/18/2019), or pt will call with BP's QOD in 2 wks and tell CMA what they are, for Medicare Wellness-needs in person- 4 extended memory eval & FBW with dementia panel .  Can you please contact patient to schedule apt for patient to follow up. AS, CMA

## 2019-05-11 ENCOUNTER — Inpatient Hospital Stay (HOSPITAL_COMMUNITY): Admission: RE | Admit: 2019-05-11 | Payer: Medicare HMO | Source: Ambulatory Visit

## 2019-05-14 ENCOUNTER — Ambulatory Visit: Payer: Medicare HMO | Admitting: Pulmonary Disease

## 2019-05-14 NOTE — Telephone Encounter (Signed)
Who refilled this last for pt?  Was it our clinic?    If it was [place] prior and bmp or CMP w/in last 6 mo revealed N K levels, then ok.

## 2019-06-21 ENCOUNTER — Other Ambulatory Visit: Payer: Self-pay | Admitting: Family Medicine

## 2019-06-21 DIAGNOSIS — I10 Essential (primary) hypertension: Secondary | ICD-10-CM

## 2019-06-28 ENCOUNTER — Encounter (HOSPITAL_COMMUNITY): Payer: Self-pay

## 2019-06-28 ENCOUNTER — Ambulatory Visit (INDEPENDENT_AMBULATORY_CARE_PROVIDER_SITE_OTHER): Payer: Medicare HMO

## 2019-06-28 ENCOUNTER — Ambulatory Visit (INDEPENDENT_AMBULATORY_CARE_PROVIDER_SITE_OTHER)
Admission: EM | Admit: 2019-06-28 | Discharge: 2019-06-28 | Disposition: A | Discharge status: Home / Self Care | Payer: Medicare HMO | Source: Home / Self Care | Attending: Family Medicine | Admitting: Family Medicine

## 2019-06-28 ENCOUNTER — Emergency Department (HOSPITAL_COMMUNITY)
Admission: EM | Admit: 2019-06-28 | Discharge: 2019-06-29 | Discharge status: Against Medical Advice | Payer: Medicare HMO | Attending: Emergency Medicine | Admitting: Emergency Medicine

## 2019-06-28 ENCOUNTER — Other Ambulatory Visit: Payer: Self-pay

## 2019-06-28 DIAGNOSIS — R0600 Dyspnea, unspecified: Secondary | ICD-10-CM | POA: Diagnosis not present

## 2019-06-28 DIAGNOSIS — J181 Lobar pneumonia, unspecified organism: Secondary | ICD-10-CM | POA: Diagnosis not present

## 2019-06-28 DIAGNOSIS — R197 Diarrhea, unspecified: Secondary | ICD-10-CM | POA: Insufficient documentation

## 2019-06-28 DIAGNOSIS — Z5321 Procedure and treatment not carried out due to patient leaving prior to being seen by health care provider: Secondary | ICD-10-CM | POA: Diagnosis not present

## 2019-06-28 DIAGNOSIS — R0602 Shortness of breath: Secondary | ICD-10-CM | POA: Diagnosis present

## 2019-06-28 DIAGNOSIS — Z20822 Contact with and (suspected) exposure to covid-19: Secondary | ICD-10-CM

## 2019-06-28 DIAGNOSIS — J189 Pneumonia, unspecified organism: Secondary | ICD-10-CM | POA: Insufficient documentation

## 2019-06-28 DIAGNOSIS — R509 Fever, unspecified: Secondary | ICD-10-CM | POA: Diagnosis not present

## 2019-06-28 LAB — COMPREHENSIVE METABOLIC PANEL
ALT: 28 U/L (ref 0–44)
AST: 48 U/L — ABNORMAL HIGH (ref 15–41)
Albumin: 3.5 g/dL (ref 3.5–5.0)
Alkaline Phosphatase: 75 U/L (ref 38–126)
Anion gap: 7 (ref 5–15)
BUN: 30 mg/dL — ABNORMAL HIGH (ref 8–23)
CO2: 29 mmol/L (ref 22–32)
Calcium: 9 mg/dL (ref 8.9–10.3)
Chloride: 101 mmol/L (ref 98–111)
Creatinine, Ser: 1.29 mg/dL — ABNORMAL HIGH (ref 0.44–1.00)
GFR calc Af Amer: 49 mL/min — ABNORMAL LOW (ref >60)
GFR calc non Af Amer: 42 mL/min — ABNORMAL LOW (ref >60)
Glucose, Bld: 130 mg/dL — ABNORMAL HIGH (ref 70–99)
Potassium: 3.6 mmol/L (ref 3.5–5.1)
Sodium: 137 mmol/L (ref 135–145)
Total Bilirubin: 0.8 mg/dL (ref 0.3–1.2)
Total Protein: 7.1 g/dL (ref 6.5–8.1)

## 2019-06-28 LAB — CBC WITH DIFFERENTIAL/PLATELET
Abs Immature Granulocytes: 0.35 10*3/uL — ABNORMAL HIGH (ref 0.00–0.07)
Basophils Absolute: 0.1 10*3/uL (ref 0.0–0.1)
Basophils Relative: 0 %
Eosinophils Absolute: 0.1 10*3/uL (ref 0.0–0.5)
Eosinophils Relative: 1 %
HCT: 43.3 % (ref 36.0–46.0)
Hemoglobin: 14 g/dL (ref 12.0–15.0)
Immature Granulocytes: 2 %
Lymphocytes Relative: 5 %
Lymphs Abs: 0.9 10*3/uL (ref 0.7–4.0)
MCH: 31.1 pg (ref 26.0–34.0)
MCHC: 32.3 g/dL (ref 30.0–36.0)
MCV: 96.2 fL (ref 80.0–100.0)
Monocytes Absolute: 0.9 10*3/uL (ref 0.1–1.0)
Monocytes Relative: 5 %
Neutro Abs: 14.2 10*3/uL — ABNORMAL HIGH (ref 1.7–7.7)
Neutrophils Relative %: 87 %
Platelets: 208 10*3/uL (ref 150–400)
RBC: 4.5 MIL/uL (ref 3.87–5.11)
RDW: 14.4 % (ref 11.5–15.5)
WBC: 16.4 10*3/uL — ABNORMAL HIGH (ref 4.0–10.5)
nRBC: 0 % (ref 0.0–0.2)

## 2019-06-28 LAB — POC SARS CORONAVIRUS 2 AG: SARS Coronavirus 2 Ag: NEGATIVE

## 2019-06-28 LAB — POC SARS CORONAVIRUS 2 AG -  ED: SARS Coronavirus 2 Ag: NEGATIVE

## 2019-06-28 MED ORDER — ONDANSETRON 4 MG PO TBDP
4.0000 mg | ORAL_TABLET | Freq: Once | ORAL | Status: AC
  Administered 2019-06-28: 4 mg via ORAL

## 2019-06-28 MED ORDER — ACETAMINOPHEN 325 MG PO TABS
650.0000 mg | ORAL_TABLET | Freq: Once | ORAL | Status: AC
  Administered 2019-06-28: 17:00:00 650 mg via ORAL

## 2019-06-28 MED ORDER — ACETAMINOPHEN 325 MG PO TABS
ORAL_TABLET | ORAL | Status: AC
  Filled 2019-06-28: qty 2

## 2019-06-28 MED ORDER — ONDANSETRON 4 MG PO TBDP
ORAL_TABLET | ORAL | Status: AC
  Filled 2019-06-28: qty 1

## 2019-06-28 NOTE — ED Notes (Signed)
Pt stated there is someone coming to get her and that she's leaving AMA. Pt advised to return if symptoms worsen.

## 2019-06-28 NOTE — ED Notes (Signed)
Patient desaturated to 89% on room air while ambulating, was 92% at rest prior to ambulation. Pt walked approx 20 feet before she felt SOB and shaky, requested to return to her room to rest. HR 87-95 bmp.

## 2019-06-28 NOTE — ED Provider Notes (Signed)
[place]    CSN: [id number] Arrival date & time: 06/28/19  1540      History   Chief Complaint Chief Complaint  Patient presents with  . Fever  . Nasal Congestion  . Generalized Body Aches  . Nausea  . Diarrhea    HPI Tiffany Murphy is a 69 y.o. female.   Tiffany Murphy presents with complaints of fevers up to 102, which started two days ago. Today fevers have improved, but feels hot. Body aches. Cough and congestion which is worse than normal for her- history of COPD. Hasn't been able to take her medications the past few nights. Nausea. Took nausea medication from her daughter which did help. Still with current nausea. Diarrhea. Today had an episode of stool incontinence as she felt too sore/fatigued to get to the bathroom. Some chest pain. Shortness of breath. Has used her inhaler today which did help. No known ill contacts. Doesn't work outside the home. Endorses good social distancing. Her husband and granddaughter work outside the home, but they have been feeling well. Took ibuprofen, which did help. Today feels worse this afternoon. States there have been more than one incident of having to lower herself to the ground due to weakness in order to prevent falling. History of htn, copd, gerd, depression.    ROS per HPI, negative if not otherwise mentioned.      Past Medical History:  Diagnosis Date  . Asthma   . Closed fracture of left distal radius 10/25/2018  . COPD (chronic obstructive pulmonary disease) ([place])   . GERD (gastroesophageal reflux disease)   . Hypercholesteremia   . Hypertension   . Sleep apnea   . Tobacco abuse     Patient Active Problem List   Diagnosis Date Noted  . Subjective memory complaints 02/21/2019  . Frequency of urination- pt thinks due to HCTZ BP med 02/21/2019  . Hyperlipidemia with target LDL less than 70 11/13/2018  . Closed fracture of left distal radius 10/25/2018  . Tobacco abuse counseling 07/12/2018  . Sleep  difficulties- poor sleep hygeine 07/12/2018  . Chronic pain disorder 07/12/2018  . Chronic joint pain 07/12/2018  . OSA and COPD overlap syndrome ([place]) 04/30/2018  . Chronic fatigue 04/30/2018  . Lung nodule seen on imaging study 04/30/2018  . Vitamin D deficiency 04/30/2018  . Tobacco use disorder 04/11/2018  . Elevated LDL cholesterol level 04/11/2018  . Chronic obstructive pulmonary disease ([place]) 04/11/2018  . GERD (gastroesophageal reflux disease) 04/11/2018  . Asthma 04/11/2018  . Obesity, Class I, BMI 30-34.9 04/11/2018  . Glucose intolerance (impaired glucose tolerance) 04/11/2018  . Irritable bowel syndrome 04/11/2018  . Chronic bilateral upper abdominal pain 04/11/2018  . Panic attacks 04/11/2018  . Mood disorder ([place]) 04/11/2018  . Hypokalemia 04/11/2018  . Environmental and seasonal allergies 04/11/2018  . Elevated coronary artery calcium score 12/01/2014  . Benign essential HTN 07/11/2013  . Depression with anxiety 07/11/2013  . Pure hypercholesterolemia 07/11/2013  . Nicotine addiction 06/15/2013  . Overweight 06/15/2013    Past Surgical History:  Procedure Laterality Date  . APPENDECTOMY    . COLONOSCOPY  08/26/2015   Colonic polyps status post polypectomy. Minimal sigmoid diverticulosis.   Marland Kitchen ESOPHAGOGASTRODUODENOSCOPY  02/25/2011   Large hiatal hernia otherwise normal EGD.  Marland Kitchen OPEN REDUCTION INTERNAL FIXATION (ORIF) DISTAL RADIAL FRACTURE Left 10/25/2018   Procedure: OPEN REDUCTION INTERNAL FIXATION (ORIF)LEFT  DISTAL RADIAL FRACTURE;  Surgeon: Marchia Bond, MD;  Location: [facility];  Service: Orthopedics;  Laterality:  Left;  . VAGINAL HYSTERECTOMY      OB History   No obstetric history on file.      Home Medications    Prior to Admission medications   Medication Sig Start Date End Date Taking? Authorizing Provider  acyclovir (ZOVIRAX) 200 MG capsule Take 1 capsule by mouth daily. 10/26/18   [provider]  albuterol (PROVENTIL  HFA;VENTOLIN HFA) 108 (90 Base) MCG/ACT inhaler Inhale 2 puffs into the lungs every 6 (six) hours as needed for wheezing. 04/30/18   Opalski, Neoma Laming, DO  buPROPion (WELLBUTRIN SR) 150 MG 12 hr tablet Take 1 tablet (150 mg total) by mouth 2 (two) times daily. 05/01/19   Laurin Coder, MD  dicyclomine (BENTYL) 10 MG capsule Take 1 capsule (10 mg total) by mouth 2 (two) times a day. Patient taking differently: Take 20 mg by mouth 2 (two) times a day.  01/21/19   Jackquline Denmark, MD  dicyclomine (BENTYL) 20 MG tablet Take 1 tablet (20 mg total) by mouth 2 (two) times daily. 04/03/19   Jackquline Denmark, MD  fluticasone (FLONASE) 50 MCG/ACT nasal spray Place 2 sprays into both nostrils daily. 12/11/18   Opalski, Neoma Laming, DO  Fluticasone-Umeclidin-Vilant (TRELEGY ELLIPTA) 100-62.5-25 MCG/INH AEPB Inhale 1 puff into the lungs daily. 12/17/18   Olalere, Cicero Duck A, MD  gabapentin (NEURONTIN) 300 MG capsule TAKE 1 CAPSULE IN THE MORNING, 1 CAPSULE IN THE  AFTERNOON, AND 2 CAPSULES AT BEDTIME 04/29/19   Opalski, Deborah, DO  losartan (COZAAR) 100 MG tablet TAKE 1 TABLET BY MOUTH ONCE DAILY(NEEDS APPT) 06/24/19   Opalski, Neoma Laming, DO  montelukast (SINGULAIR) 10 MG tablet TAKE 1 TABLET AT BEDTIME 04/29/19   Opalski, Deborah, DO  omeprazole (PRILOSEC) 40 MG capsule Take 1 capsule (40 mg total) by mouth daily. 04/30/19   Opalski, Neoma Laming, DO  potassium chloride SA (KLOR-CON) 20 MEQ tablet Take 1 tablet (20 mEq total) by mouth 2 (two) times daily. 05/14/19   Opalski, Neoma Laming, DO  rosuvastatin (CRESTOR) 10 MG tablet Take 1 tablet (10 mg total) by mouth daily. 12/11/18   Mellody Dance, DO  sertraline (ZOLOFT) 100 MG tablet Take 2 tablets by mouth once daily 03/11/19   Mellody Dance, DO    Family History Family History  Problem Relation Age of Onset  . Alzheimer's disease Father 42  . Diabetes Father   . CAD Mother 69  . Colon cancer Neg Hx   . Esophageal cancer Neg Hx     Social History Social History    Tobacco Use  . Smoking status: Former Smoker    Packs/day: 0.25    Years: 44.00    Pack years: 11.00    Types: Cigarettes    Quit date: 03/24/2019    Years since quitting: 0.2  . Smokeless tobacco: Never Used  . Tobacco comment: in process of quitting  Substance Use Topics  . Alcohol use: No    Alcohol/week: 0.0 standard drinks  . Drug use: No     Allergies   Penicillins, Lovastatin, and Other   Review of Systems Review of Systems   Physical Exam Triage Vital Signs ED Triage Vitals  Enc Vitals Group     BP 06/28/19 1611 100/70     Pulse Rate 06/28/19 1611 96     Resp 06/28/19 1611 16     Temp 06/28/19 1611 100 F (37.8 C)     Temp Source 06/28/19 1611 Oral     SpO2 06/28/19 1611 94 %     Weight --  Height --      Head Circumference --      Peak Flow --      Pain Score 06/28/19 1609 0     Pain Loc --      Pain Edu? --      Excl. in [place]? --    No data found.  Updated Vital Signs BP 100/70 (BP Location: Left Arm)   Pulse 89   Temp 100 F (37.8 C) (Oral)   Resp (!) 36   SpO2 (!) 89%    Physical Exam Constitutional:      Appearance: She is well-developed. She is ill-appearing.  Cardiovascular:     Rate and Rhythm: Normal rate.  Pulmonary:     Effort: Pulmonary effort is normal. Tachypnea present.     Breath sounds: Decreased breath sounds present.  Skin:    General: Skin is warm and dry.  Neurological:     Mental Status: She is alert and oriented to person, place, and time.      UC Treatments / Results  Labs (all labs ordered are listed, but only abnormal results are displayed) Labs Reviewed  NOVEL CORONAVIRUS, NAA (HOSP ORDER, SEND-OUT TO REF LAB; TAT 18-24 HRS)  CBC WITH DIFFERENTIAL/PLATELET  COMPREHENSIVE METABOLIC PANEL  POC SARS CORONAVIRUS 2 AG -  ED  POC SARS CORONAVIRUS 2 AG    EKG   Radiology DG Chest 2 View  Result Date: 06/28/2019 CLINICAL DATA:  Dyspnea, fever, COPD EXAM: CHEST - 2 VIEW COMPARISON:  01/03/2018 chest  radiograph. FINDINGS: Stable cardiomediastinal silhouette with normal heart size. No pneumothorax. No pleural effusion. Extensive patchy opacity throughout the left lung is new. IMPRESSION: Extensive patchy opacity throughout the left lung, new, most compatible with pneumonia. Chest radiograph follow-up advised. Electronically Signed   By: Ilona Sorrel M.D.   On: 06/28/2019 17:23    Procedures Procedures (including critical care time)  Medications Ordered in UC Medications  acetaminophen (TYLENOL) tablet 650 mg (650 mg Oral Given 06/28/19 1702)  ondansetron (ZOFRAN-ODT) disintegrating tablet 4 mg (4 mg Oral Given 06/28/19 1702)    Initial Impression / Assessment and Plan / UC Course  I have reviewed the triage vital signs and the nursing notes.  Pertinent labs & imaging results that were available during my care of the patient were reviewed by me and considered in my medical decision making (see chart for details).     1725- nursing noted that patient seemed to have some forgetfulness. Unknown if this is new for patient, per chart review listed as "subjective memory complaints" vs related to illness   1750- patient unable to tolerate ambulating in the hallway due to weakness, desat to 89%, continued tachypnea.    Patient with labs pending. Xray concerning for pneumonia.  Negative rapid covid but still seems highly suspicious based on clinical history with gi and URI symptoms. Mild desaturation with activity. Tachypnea noted. Do recommend further evaluation and management in the ER as patient with significant weakness, having to stool herself on the floor rather than get to the restroom, as well as shortness of breath. Patient's husband to drive her to the ER now.   Final Clinical Impressions(s) / UC Diagnoses   Final diagnoses:  Pneumonia of left lower lobe due to infectious organism  Suspected COVID-19 virus infection  Diarrhea, unspecified type   Discharge Instructions   None    ED  Prescriptions    None     PDMP not reviewed this encounter.   Augusto Gamble  B, NP 06/28/19 1733

## 2019-06-28 NOTE — ED Triage Notes (Signed)
Pt sent from UC for further evaluation of sob, cough and body aches, pt had chest xray at UC that confirmed penumonia, pt also had labs drawn. Pt a.o, resp e.u at this time. Rapid POC COVID test at [facility] was negative

## 2019-06-28 NOTE — ED Triage Notes (Signed)
Pt presents to UC with nausea, diarrhea, nasal congestion, fever and diarrhea x 3 days.

## 2019-06-28 NOTE — Discharge Instructions (Signed)
Please go to the ER now for further evaluation.

## 2019-06-28 NOTE — ED Notes (Signed)
Patient is being discharged from the [facility] and sent to the Emergency Department via POV driven by husbandf. Per Lanelle Bal, NP, patient is stable but in need of higher level of care due to desaturation at rest and worse with ambulation. Patient is aware and verbalizes understanding of plan of care.  Vitals:   06/28/19 1724 06/28/19 1727  BP:    Pulse:  89  Resp: (!) 36   Temp:    SpO2:  (!) 89%

## 2019-06-29 LAB — NOVEL CORONAVIRUS, NAA (HOSP ORDER, SEND-OUT TO REF LAB; TAT 18-24 HRS): SARS-CoV-2, NAA: NOT DETECTED

## 2019-06-29 NOTE — ED Notes (Signed)
Pt called in lobby for vitals recheck but no answer

## 2019-07-01 ENCOUNTER — Telehealth (HOSPITAL_COMMUNITY): Payer: Self-pay | Admitting: Emergency Medicine

## 2019-07-01 NOTE — Telephone Encounter (Signed)
Called and spoke to husband on the phone, husband states shes doing "okay". Encouraged him to bring her back to ER. Husband states "well they did a terrible job there she waited five hours". At this time does not want to take her to the ER, but encouraged pt if she is feeling worse and or not getting better, he needs to take her to the ER. He verbalized understanding, all questions answered.

## 2019-07-03 ENCOUNTER — Other Ambulatory Visit: Payer: Self-pay | Admitting: Pulmonary Disease

## 2019-07-03 DIAGNOSIS — Z720 Tobacco use: Secondary | ICD-10-CM

## 2019-07-12 ENCOUNTER — Other Ambulatory Visit: Payer: Self-pay | Admitting: Family Medicine

## 2019-07-12 DIAGNOSIS — J3089 Other allergic rhinitis: Secondary | ICD-10-CM

## 2019-07-19 ENCOUNTER — Encounter: Payer: Self-pay | Admitting: Family Medicine

## 2019-07-19 ENCOUNTER — Other Ambulatory Visit: Payer: Self-pay

## 2019-07-19 ENCOUNTER — Ambulatory Visit (INDEPENDENT_AMBULATORY_CARE_PROVIDER_SITE_OTHER): Payer: Medicare HMO | Admitting: Family Medicine

## 2019-07-19 VITALS — BP 119/67 | HR 69 | Temp 98.6°F | Resp 12 | Ht 62.0 in | Wt 186.3 lb

## 2019-07-19 DIAGNOSIS — Z1159 Encounter for screening for other viral diseases: Secondary | ICD-10-CM

## 2019-07-19 DIAGNOSIS — E785 Hyperlipidemia, unspecified: Secondary | ICD-10-CM | POA: Diagnosis not present

## 2019-07-19 DIAGNOSIS — Z780 Asymptomatic menopausal state: Secondary | ICD-10-CM | POA: Diagnosis not present

## 2019-07-19 DIAGNOSIS — E7800 Pure hypercholesterolemia, unspecified: Secondary | ICD-10-CM

## 2019-07-19 DIAGNOSIS — I10 Essential (primary) hypertension: Secondary | ICD-10-CM | POA: Diagnosis not present

## 2019-07-19 DIAGNOSIS — E2839 Other primary ovarian failure: Secondary | ICD-10-CM | POA: Diagnosis not present

## 2019-07-19 DIAGNOSIS — E559 Vitamin D deficiency, unspecified: Secondary | ICD-10-CM

## 2019-07-19 DIAGNOSIS — Z23 Encounter for immunization: Secondary | ICD-10-CM

## 2019-07-19 DIAGNOSIS — Z1231 Encounter for screening mammogram for malignant neoplasm of breast: Secondary | ICD-10-CM

## 2019-07-19 DIAGNOSIS — Z0000 Encounter for general adult medical examination without abnormal findings: Secondary | ICD-10-CM

## 2019-07-19 DIAGNOSIS — Z114 Encounter for screening for human immunodeficiency virus [HIV]: Secondary | ICD-10-CM | POA: Diagnosis not present

## 2019-07-19 DIAGNOSIS — Z716 Tobacco abuse counseling: Secondary | ICD-10-CM

## 2019-07-19 DIAGNOSIS — Z720 Tobacco use: Secondary | ICD-10-CM

## 2019-07-19 MED ORDER — VARENICLINE TARTRATE 1 MG PO TABS: 1.0000 mg | ORAL_TABLET | Freq: Two times a day (BID) | ORAL | 0 refills | Status: DC

## 2019-07-19 MED ORDER — CHANTIX STARTING MONTH PAK 0.5 MG X 11 & 1 MG X 42 PO TABS: ORAL_TABLET | ORAL | 0 refills | Status: DC

## 2019-07-19 MED ORDER — SHINGRIX 50 MCG/0.5ML IM SUSR: 0.5000 mL | Freq: Once | INTRAMUSCULAR | 0 refills | Status: AC

## 2019-07-19 NOTE — Progress Notes (Signed)
Subjective:   Tiffany Murphy is a 69 y.o. female who presents for Medicare Annual (Subsequent) preventive examination.   HPI: Notes she thought she had COVID earlier this month because she felt so sick. Patient has memory concerns but confirms that she has never forgotten anything in a pathological manner. Notes it's been a long time since she has had a hearing evaluation. She walks many days per week when the weather is good, about 2-3 times per week. Notes "I walk a good mile and it takes about twenty minutes, I think." Notes "I fall so much, I'm afraid to walk by myself." Says "I have fallen a lot this year. I mean, I have fallen a lot." States she doesn't use anything like a cane or a walker to help her walk. Notes she's fallen out of bed, tripped over her own feet; confirms mainly tripping and having accidents, not falling while walking normally.    Objective:     Vitals: BP (!) 144/69   Pulse 69   Temp 98.6 F (37 C) (Oral)   Resp 12   Ht 5\' 2"  (1.575 m)   Wt 186 lb 4.8 oz (84.5 kg)   SpO2 95%   BMI 34.07 kg/m   Body mass index is 34.07 kg/m.  Advanced Directives 10/23/2018  Does Patient Have a Medical Advance Directive? Yes  Type of Advance Directive Living will;Healthcare Power of Attorney    Tobacco Social History   Tobacco Use  Smoking Status Current Every Day Smoker  . Packs/day: 1.00  . Years: 44.00  . Pack years: 44.00  . Types: Cigarettes  . Last attempt to quit: 03/24/2019  . Years since quitting: 0.3  Smokeless Tobacco Never Used  Tobacco Comment   in process of quitting     Ready to quit: Not Answered Counseling given: Not Answered Comment: in process of quitting     Past Medical History:  Diagnosis Date  . Asthma   . Closed fracture of left distal radius 10/25/2018  . COPD (chronic obstructive pulmonary disease) ([place])   . GERD (gastroesophageal reflux disease)   . Hypercholesteremia   . Hypertension   . Sleep apnea   . Tobacco abuse    Past  Surgical History:  Procedure Laterality Date  . APPENDECTOMY    . COLONOSCOPY  08/26/2015   Colonic polyps status post polypectomy. Minimal sigmoid diverticulosis.   Marland Kitchen ESOPHAGOGASTRODUODENOSCOPY  02/25/2011   Large hiatal hernia otherwise normal EGD.  Marland Kitchen OPEN REDUCTION INTERNAL FIXATION (ORIF) DISTAL RADIAL FRACTURE Left 10/25/2018   Procedure: OPEN REDUCTION INTERNAL FIXATION (ORIF)LEFT  DISTAL RADIAL FRACTURE;  Surgeon: Marchia Bond, MD;  Location: [facility];  Service: Orthopedics;  Laterality: Left;  Marland Kitchen VAGINAL HYSTERECTOMY     Family History  Problem Relation Age of Onset  . Alzheimer's disease Father 18  . Diabetes Father   . CAD Mother 53  . Colon cancer Neg Hx   . Esophageal cancer Neg Hx    Social History   Socioeconomic History  . Marital status: Married    Spouse name: Not on file  . Number of children: 2  . Years of education: Not on file  . Highest education level: Not on file  Occupational History  . Not on file  Tobacco Use  . Smoking status: Current Every Day Smoker    Packs/day: 1.00    Years: 44.00    Pack years: 44.00    Types: Cigarettes    Last attempt to quit: 03/24/2019  Years since quitting: 0.3  . Smokeless tobacco: Never Used  . Tobacco comment: in process of quitting  Substance and Sexual Activity  . Alcohol use: No    Alcohol/week: 0.0 standard drinks  . Drug use: No  . Sexual activity: Not Currently  Other Topics Concern  . Not on file  Social History Narrative   Works at [employer].    Social Determinants of Health   Financial Resource Strain:   . Difficulty of Paying Living Expenses: Not on file  Food Insecurity:   . Worried About Charity fundraiser in the Last Year: Not on file  . Ran Out of Food in the Last Year: Not on file  Transportation Needs:   . Lack of Transportation (Medical): Not on file  . Lack of Transportation (Non-Medical): Not on file  Physical Activity:   . Days of Exercise per  Week: Not on file  . Minutes of Exercise per Session: Not on file  Stress:   . Feeling of Stress : Not on file  Social Connections:   . Frequency of Communication with Friends and Family: Not on file  . Frequency of Social Gatherings with Friends and Family: Not on file  . Attends Religious Services: Not on file  . Active Member of Clubs or Organizations: Not on file  . Attends Archivist Meetings: Not on file  . Marital Status: Not on file    Outpatient Encounter Medications as of 07/19/2019  Medication Sig  . acyclovir (ZOVIRAX) 200 MG capsule Take 1 capsule by mouth daily.  Marland Kitchen albuterol (PROVENTIL HFA;VENTOLIN HFA) 108 (90 Base) MCG/ACT inhaler Inhale 2 puffs into the lungs every 6 (six) hours as needed for wheezing.  Marland Kitchen buPROPion (WELLBUTRIN SR) 150 MG 12 hr tablet Take 1 tablet (150 mg total) by mouth 2 (two) times daily.  Marland Kitchen dicyclomine (BENTYL) 10 MG capsule Take 1 capsule (10 mg total) by mouth 2 (two) times a day. (Patient taking differently: Take 20 mg by mouth 2 (two) times a day. )  . dicyclomine (BENTYL) 20 MG tablet Take 1 tablet (20 mg total) by mouth 2 (two) times daily.  . fluticasone (FLONASE) 50 MCG/ACT nasal spray USE 2 SPRAYS IN EACH NOSTRIL EVERY DAY  . Fluticasone-Umeclidin-Vilant (TRELEGY ELLIPTA) 100-62.5-25 MCG/INH AEPB Inhale 1 puff into the lungs daily.  Marland Kitchen gabapentin (NEURONTIN) 300 MG capsule TAKE 1 CAPSULE IN THE MORNING, 1 CAPSULE IN THE  AFTERNOON, AND 2 CAPSULES AT BEDTIME  . losartan (COZAAR) 100 MG tablet TAKE 1 TABLET BY MOUTH ONCE DAILY(NEEDS APPT)  . montelukast (SINGULAIR) 10 MG tablet TAKE 1 TABLET AT BEDTIME  . omeprazole (PRILOSEC) 40 MG capsule Take 1 capsule (40 mg total) by mouth daily.  . potassium chloride SA (KLOR-CON) 20 MEQ tablet Take 1 tablet (20 mEq total) by mouth 2 (two) times daily.  . rosuvastatin (CRESTOR) 10 MG tablet Take 1 tablet (10 mg total) by mouth daily.  . sertraline (ZOLOFT) 100 MG tablet Take 2 tablets by mouth  once daily  . varenicline (CHANTIX STARTING MONTH PAK) 0.5 MG X 11 & 1 MG X 42 tablet Take one 0.5mg  tablet by mouth once daily for 3 days, then increase to one 0.5mg  tablet twice daily for 3 days, then increase to one 1mg  tablet twice daily.  . varenicline (CHANTIX) 1 MG tablet Take 1 tablet (1 mg total) by mouth 2 (two) times daily.  Marland Kitchen Zoster Vaccine Adjuvanted [facility]) injection Inject 0.5 mLs into the muscle once  for 1 dose.   No facility-administered encounter medications on file as of 07/19/2019.    Activities of Daily Living In your present state of health, do you have any difficulty performing the following activities: 07/19/2019 10/25/2018  Hearing? [place]? Y -  Difficulty concentrating or making decisions? Y -  Walking or climbing stairs? N -  Dressing or bathing? N -  Doing errands, shopping? N N  Some recent data might be hidden    Patient Care Team: Mellody Dance, DO as PCP - General (Family Medicine) Minus Breeding, MD as Consulting Physician (Cardiology) Jackquline Denmark, MD as Consulting Physician (Gastroenterology) Adron Bene, Woody Seller, MD as Referring Physician (Family Medicine) Laurin Coder, MD as Consulting Physician (Pulmonary Disease) Marchia Bond, MD as Consulting Physician (Orthopedic Surgery)    Assessment:   This is a routine wellness examination for Charletta.  Exercise Activities and Dietary recommendations    Goals   None     Fall Risk Fall Risk  07/19/2019 07/12/2018 04/11/2018 01/22/2014  Falls in the past year? 1 0 No Yes  Comment - - - fell off chair while cleaning ceiling fan  Number falls in past yr: 1 - - -  Injury with Fall? 1 - - -  Follow up Falls evaluation completed - - -   Is the patient's home free of loose throw rugs in walkways, pet beds, electrical cords, etc?   yes      Grab bars in the bathroom? no      Handrails on the stairs?   no      Adequate lighting?   yes  Timed Get Up and Go performed:  Telehealth  Depression Screen PHQ 2/9 Scores 07/19/2019 02/21/2019 11/13/2018 07/12/2018  PHQ - 2 Score 4 2 1 6   PHQ- 9 Score 13 11 7 16      Cognitive Function     6CIT Screen 07/19/2019  What Year? 0 points  What month? 0 points  What time? 0 points  Count back from 20 0 points  Months in reverse 0 points  Repeat phrase 0 points  Total Score 0    Immunization History  Administered Date(s) Administered  . Influenza, High Dose Seasonal PF 07/12/2018  . Influenza,inj,Quad PF,6+ Mos 07/11/2013  . Tdap 06/20/2008, 10/21/2018    Qualifies for Shingles Vaccine? The yes. She will obtain first 1 now told to repeat 2 to 6 months and to make appointment for this.  Pneumococcal vaccines: Patient states she has had both of them in the past. CMA tracking down from [employer].   Screening Tests Health Maintenance  Topic Date Due  . Hepatitis C Screening  09-Feb-1951  . MAMMOGRAM  04/11/2013  . DEXA SCAN  06/18/2014  . PNA vac Low Risk Adult (1 of 2 - PCV13) 06/18/2014  . COLONOSCOPY  06/20/2018  . INFLUENZA VACCINE  01/19/2019  . TETANUS/TDAP  10/20/2028    Cancer Screenings: Lung: Low Dose CT Chest recommended if Age 30-80 years, 30 pack-year currently smoking OR have quit w/in 15years. Patient does qualify but advised to talk to her pulmonologist about this.  Breast:  Up to date on Mammogram? No  ; CMA tracking down from last physician in [place]   up to date of Bone Density/Dexa? No Per patient she states she never had 1. One is ordered today.  Colorectal: Patient states she had a colonoscopy about 2 to 3 years ago by Dr. Lyndel Safe in [place]. CMA is tracking that  down from that practice.   Additional Screenings: Hepatitis C Screening: Patient qualifies and will order today.      Plan:   - Reviewed normal, age-related memory loss versus signs of pathological memory loss. - Encouraged patient to exercise 30 minutes per day, five days per week. AHA  guidelines reviewed and discussed. - Encouraged the patient to use a cane or another assistive ambulatory device whenever she is walking. -Smoking cessation discussed with patient. Chantix was started. Handouts were provided. Encouraged to go to [email or web address] for further information   Orders Placed This Encounter  Procedures  . MM Digital Screening  . DG Bone Density  . CBC w/Diff  . Comprehensive metabolic panel  . TSH  . T4, free  . Hemoglobin A1c  . Lipid panel  . VITAMIN D 25 Hydroxy (Vit-D Deficiency, Fractures)  . Hepatitis C Antibody    Meds ordered this encounter  Medications  . Zoster Vaccine Adjuvanted [facility]) injection    Sig: Inject 0.5 mLs into the muscle once for 1 dose.    Dispense:  0.5 mL    Refill:  0  . varenicline (CHANTIX STARTING MONTH PAK) 0.5 MG X 11 & 1 MG X 42 tablet    Sig: Take one 0.5mg  tablet by mouth once daily for 3 days, then increase to one 0.5mg  tablet twice daily for 3 days, then increase to one 1mg  tablet twice daily.    Dispense:  53 tablet    Refill:  0  . varenicline (CHANTIX) 1 MG tablet    Sig: Take 1 tablet (1 mg total) by mouth 2 (two) times daily.    Dispense:  60 tablet    Refill:  0   I have personally reviewed and noted the following in the patient's chart:   . Medical and social history . Use of alcohol, tobacco or illicit drugs  . Current medications and supplements . Functional ability and status . Nutritional status . Physical activity . Advanced directives . List of other physicians . Hospitalizations, surgeries, and ER visits in previous 12 months . Vitals . Screenings to include cognitive, depression, and falls . Referrals and appointments  In addition, I have reviewed and discussed with patient certain preventive protocols, quality metrics, and best practice recommendations. A written personalized care plan for preventive services as well as general preventive health recommendations were provided to patient.     Return f/up next available for mood/depressed feelings via tele AND 4wks-smoking cessation-chantix started.  Mellody Dance

## 2019-07-19 NOTE — Patient Instructions (Addendum)
Follow up in two months for second shingles vaccine.  Also please talk to your pulmonologist about a lung cancer screening CT. Since today you told us to smoke about a pack a day on average since you have been 69 years old, this is much different than the history we had in the chart. You do qualify for lung cancer screening so please discuss this with your pulmonologist    Tobacco Use Disorder Tobacco use disorder (TUD) occurs when a person craves, seeks, and uses tobacco, regardless of the consequences. This disorder can cause problems with mental and physical health. It can affect your ability to have healthy relationships, and it can keep you from meeting your responsibilities at work, home, or school. Tobacco may be:  Smoked as a cigarette or cigar.  Inhaled using e-cigarettes.  Smoked in a pipe or hookah.  Chewed as smokeless tobacco.  Inhaled into the nostrils as snuff. Tobacco products contain a dangerous chemical called nicotine, which is very addictive. Nicotine triggers hormones that make the body feel stimulated and works on areas of the brain that make you feel good. These effects can make it hard for people to quit nicotine. Tobacco contains many other unsafe chemicals that can damage almost every organ in the body. Smoking tobacco also puts others in danger due to fire risk and possible health problems caused by breathing in secondhand smoke. What are the signs or symptoms? Symptoms of TUD may include:  Being unable to slow down or stop your tobacco use.  Spending an abnormal amount of time getting or using tobacco.  Craving tobacco. Cravings may last for up to 6 months after quitting.  Tobacco use that: ? Interferes with your work, school, or home life. ? Interferes with your personal and social relationships. ? Makes you give up activities that you once enjoyed or found important.  Using tobacco even though you know that it is: ? Dangerous or bad for your health or  someone else's health. ? Causing problems in your life.  Needing more and more of the substance to get the same effect (developing tolerance).  Experiencing unpleasant symptoms if you do not use the substance (withdrawal). Withdrawal symptoms may include: ? Depressed, anxious, or irritable mood. ? Difficulty concentrating. ? Increased appetite. ? Restlessness or trouble sleeping.  Using the substance to avoid withdrawal. How is this diagnosed? This condition may be diagnosed based on:  Your current and past tobacco use. Your health care provider may ask questions about how your tobacco use affects your life.  A physical exam. You may be diagnosed with TUD if you have at least two symptoms within a 27-monthperiod. How is this treated? This condition is treated by stopping tobacco use. Many people are unable to quit on their own and need help. Treatment may include:  Nicotine replacement therapy (NRT). NRT provides nicotine without the other harmful chemicals in tobacco. NRT gradually lowers the dosage of nicotine in the body and reduces withdrawal symptoms. NRT is available as: ? Over-the-counter gums, lozenges, and skin patches. ? Prescription mouth inhalers and nasal sprays.  Medicine that acts on the brain to reduce cravings and withdrawal symptoms.  A type of talk therapy that examines your triggers for tobacco use, how to avoid them, and how to cope with cravings (behavioral therapy).  Hypnosis. This may help with withdrawal symptoms.  Joining a support group for others coping with TUD. The best treatment for TUD is usually a combination of medicine, talk therapy, and support  groups. Recovery can be a long process. Many people start using tobacco again after stopping (relapse). If you relapse, it does not mean that treatment will not work. Follow these instructions at home:  Lifestyle  Do not use any products that contain nicotine or tobacco, such as cigarettes and  e-cigarettes.  Avoid things that trigger tobacco use as much as you can. Triggers include people and situations that usually cause you to use tobacco.  Avoid drinks that contain caffeine, including coffee. These may worsen some withdrawal symptoms.  Find ways to manage stress. Wanting to smoke may cause stress, and stress can make you want to smoke. Relaxation techniques such as deep breathing, meditation, and yoga may help.  Attend support groups as needed. These groups are an important part of long-term recovery for many people. General instructions  Take over-the-counter and prescription medicines only as told by your health care provider.  Check with your health care provider before taking any new prescription or over-the-counter medicines.  Decide on a friend, family member, or smoking quit-line (such as 1-800-QUIT-NOW in the [place] that you can call or text when you feel the urge to smoke or when you need help coping with cravings.  Keep all follow-up visits as told by your health care provider and therapist. This is important. Contact a health care provider if:  You are not able to take your medicines as prescribed.  Your symptoms get worse, even with treatment. Summary  Tobacco use disorder (TUD) occurs when a person craves, seeks, and uses tobacco regardless of the consequences.  This condition may be diagnosed based on your current and past tobacco use and a physical exam.  Many people are unable to quit on their own and need help. Recovery can be a long process.  The most effective treatment for TUD is usually a combination of medicine, talk therapy, and support groups. This information is not intended to replace advice given to you by your health care provider. Make sure you discuss any questions you have with your health care provider. Document Revised: 05/24/2017 Document Reviewed: 05/24/2017 Elsevier Patient Education  2020 [employer].    Steps to Quit  Smoking Smoking tobacco is the leading cause of preventable death. It can affect almost every organ in the body. Smoking puts you and those around you at risk for developing many serious chronic diseases. Quitting smoking can be difficult, but it is one of the best things that you can do for your health. It is never too late to quit. How do I get ready to quit? When you decide to quit smoking, create a plan to help you succeed. Before you quit:  Pick a date to quit. Set a date within the next 2 weeks to give you time to prepare.  Write down the reasons why you are quitting. Keep this list in places where you will see it often.  Tell your family, friends, and co-workers that you are quitting. Support from your loved ones can make quitting easier.  Talk with your health care provider about your options for quitting smoking.  Find out what treatment options are covered by your health insurance.  Identify people, places, things, and activities that make you want to smoke (triggers). Avoid them. What first steps can I take to quit smoking?  Throw away all cigarettes at home, at work, and in your car.  Throw away smoking accessories, such as Scientist, research (medical).  Clean your car. Make sure to empty the ashtray.  Clean your home, including curtains and carpets. What strategies can I use to quit smoking? Talk with your health care provider about combining strategies, such as taking medicines while you are also receiving in-person counseling. Using these two strategies together makes you more likely to succeed in quitting than if you used either strategy on its own.  If you are pregnant or breastfeeding, talk with your health care provider about finding counseling or other support strategies to quit smoking. Do not take medicine to help you quit smoking unless your health care provider tells you to do so. To quit smoking: Quit right away  Quit smoking completely, instead of gradually reducing  how much you smoke over a period of time. Research shows that stopping smoking right away is more successful than gradually quitting.  Attend in-person counseling to help you build problem-solving skills. You are more likely to succeed in quitting if you attend counseling sessions regularly. Even short sessions of 10 minutes can be effective. Take medicine You may take medicines to help you quit smoking. Some medicines require a prescription and some you can purchase over-the-counter. Medicines may have nicotine in them to replace the nicotine in cigarettes. Medicines may:  Help to stop cravings.  Help to relieve withdrawal symptoms. Your health care provider may recommend:  Nicotine patches, gum, or lozenges.  Nicotine inhalers or sprays.  Non-nicotine medicine that is taken by mouth. Find resources Find resources and support systems that can help you to quit smoking and remain smoke-free after you quit. These resources are most helpful when you use them often. They include:  Online chats with a Social worker.  Telephone quitlines.  Printed Furniture conservator/restorer.  Support groups or group counseling.  Text messaging programs.  Mobile phone apps or applications. Use apps that can help you stick to your quit plan by providing reminders, tips, and encouragement. There are many free apps for mobile devices as well as websites. Examples include Quit Guide from the [employer] and smokefree.gov What things can I do to make it easier to quit?   Reach out to your family and friends for support and encouragement. Call telephone quitlines (1-800-QUIT-NOW), reach out to support groups, or work with a counselor for support.  Ask people who smoke to avoid smoking around you.  Avoid places that trigger you to smoke, such as bars, parties, or smoke-break areas at work.  Spend time with people who do not smoke.  Lessen the stress in your life. Stress can be a smoking trigger for some people. To lessen  stress, try: ? Exercising regularly. ? Doing deep-breathing exercises. ? Doing yoga. ? Meditating. ? Performing a body scan. This involves closing your eyes, scanning your body from head to toe, and noticing which parts of your body are particularly tense. Try to relax the muscles in those areas. How will I feel when I quit smoking? Day 1 to 3 weeks Within the first 24 hours of quitting smoking, you may start to feel withdrawal symptoms. These symptoms are usually most noticeable 2-3 days after quitting, but they usually do not last for more than 2-3 weeks. You may experience these symptoms:  Mood swings.  Restlessness, anxiety, or irritability.  Trouble concentrating.  Dizziness.  Strong cravings for sugary foods and nicotine.  Mild weight gain.  Constipation.  Nausea.  Coughing or a sore throat.  Changes in how the medicines that you take for unrelated issues work in your body.  Depression.  Trouble sleeping (insomnia). Week 3 and afterward After  the first 2-3 weeks of quitting, you may start to notice more positive results, such as:  Improved sense of smell and taste.  Decreased coughing and sore throat.  Slower heart rate.  Lower blood pressure.  Clearer skin.  The ability to breathe more easily.  Fewer sick days. Quitting smoking can be very challenging. Do not get discouraged if you are not successful the first time. Some people need to make many attempts to quit before they achieve long-term success. Do your best to stick to your quit plan, and talk with your health care provider if you have any questions or concerns. Summary  Smoking tobacco is the leading cause of preventable death. Quitting smoking is one of the best things that you can do for your health.  When you decide to quit smoking, create a plan to help you succeed.  Quit smoking right away, not slowly over a period of time.  When you start quitting, seek help from your health care provider,  family, or friends. This information is not intended to replace advice given to you by your health care provider. Make sure you discuss any questions you have with your health care provider. Document Revised: 03/01/2019 Document Reviewed: 08/25/2018 Elsevier Patient Education  [place] for Adults, Female  A healthy lifestyle and preventive care can promote health and wellness. Preventive health guidelines for women include the following key practices.   A routine yearly physical is a good way to check with your health care provider about your health and preventive screening. It is a chance to share any concerns and updates on your health and to receive a thorough exam.   Visit your dentist for a routine exam and preventive care every 6 months. Brush your teeth twice a day and floss once a day. Good oral hygiene prevents tooth decay and gum disease.   The frequency of eye exams is based on your age, health, family medical history, use of contact lenses, and other factors. Follow your health care provider's recommendations for frequency of eye exams.   Eat a healthy diet. Foods like vegetables, fruits, whole grains, low-fat dairy products, and lean protein foods contain the nutrients you need without too many calories. Decrease your intake of foods high in solid fats, added sugars, and salt. Eat the right amount of calories for you.Get information about a proper diet from your health care provider, if necessary.   Regular physical exercise is one of the most important things you can do for your health. Most adults should get at least 150 minutes of moderate-intensity exercise (any activity that increases your heart rate and causes you to sweat) each week. In addition, most adults need muscle-strengthening exercises on 2 or more days a week.   Maintain a healthy weight. The body mass index (BMI) is a screening tool to identify possible weight problems. It  provides an estimate of body fat based on height and weight. Your health care provider can find your BMI, and can help you achieve or maintain a healthy weight.For adults 20 years and older:   - A BMI below 18.5 is considered underweight.   - A BMI of 18.5 to 24.9 is normal.   - A BMI of 25 to 29.9 is considered overweight.   - A BMI of 30 and above is considered obese.   Maintain normal blood lipids and cholesterol levels by exercising and minimizing your intake of trans and saturated fats.  Eat a balanced diet with plenty of fruit and vegetables. Blood tests for lipids and cholesterol should begin at age 3 and be repeated every 5 years minimum.  If your lipid or cholesterol levels are high, you are over 40, or you are at high risk for heart disease, you may need your cholesterol levels checked more frequently.Ongoing high lipid and cholesterol levels should be treated with medicines if diet and exercise are not working.   If you smoke, find out from your health care provider how to quit. If you do not use tobacco, do not start.   Lung cancer screening is recommended for adults aged 1-80 years who are at high risk for developing lung cancer because of a history of smoking. A yearly low-dose CT scan of the lungs is recommended for people who have at least a 30-pack-year history of smoking and are a current smoker or have quit within the past 15 years. A pack year of smoking is smoking an average of 1 pack of cigarettes a day for 1 year (for example: 1 pack a day for 30 years or 2 packs a day for 15 years). Yearly screening should continue until the smoker has stopped smoking for at least 15 years. Yearly screening should be stopped for people who develop a health problem that would prevent them from having lung cancer treatment.   If you are pregnant, do not drink alcohol. If you are breastfeeding, be very cautious about drinking alcohol. If you are not pregnant and choose to drink alcohol, do  not have more than 1 drink per day. One drink is considered to be 12 ounces (355 mL) of beer, 5 ounces (148 mL) of wine, or 1.5 ounces (44 mL) of liquor.   Avoid use of street drugs. Do not share needles with anyone. Ask for help if you need support or instructions about stopping the use of drugs.   High blood pressure causes heart disease and increases the risk of stroke. Your blood pressure should be checked at least yearly.  Ongoing high blood pressure should be treated with medicines if weight loss and exercise do not work.   If you are 38-53 years old, ask your health care provider if you should take aspirin to prevent strokes.   Diabetes screening involves taking a blood sample to check your fasting blood sugar level. This should be done once every 3 years, after age 10, if you are within normal weight and without risk factors for diabetes. Testing should be considered at a younger age or be carried out more frequently if you are overweight and have at least 1 risk factor for diabetes.   Breast cancer screening is essential preventive care for women. You should practice "breast self-awareness."  This means understanding the normal appearance and feel of your breasts and may include breast self-examination.  Any changes detected, no matter how small, should be reported to a health care provider.  Women in their 55s and 30s should have a clinical breast exam (CBE) by a health care provider as part of a regular health exam every 1 to 3 years.  After age 18, women should have a CBE every year.  Starting at age 62, women should consider having a mammogram (breast X-ray test) every year.  Women who have a family history of breast cancer should talk to their health care provider about genetic screening.  Women at a high risk of breast cancer should talk to their health care providers about having an  MRI and a mammogram every year.   -Breast cancer gene (BRCA)-related cancer risk assessment is  recommended for women who have family members with BRCA-related cancers. BRCA-related cancers include breast, ovarian, tubal, and peritoneal cancers. Having family members with these cancers may be associated with an increased risk for harmful changes (mutations) in the breast cancer genes BRCA1 and BRCA2. Results of the assessment will determine the need for genetic counseling and BRCA1 and BRCA2 testing.   The Pap test is a screening test for cervical cancer. A Pap test can show cell changes on the cervix that might become cervical cancer if left untreated. A Pap test is a procedure in which cells are obtained and examined from the lower end of the uterus (cervix).   - Women should have a Pap test starting at age 81.   - Between ages 49 and 32, Pap tests should be repeated every 2 years.   - Beginning at age 34, you should have a Pap test every 3 years as long as the past 3 Pap tests have been normal.   - Some women have medical problems that increase the chance of getting cervical cancer. Talk to your health care provider about these problems. It is especially important to talk to your health care provider if a new problem develops soon after your last Pap test. In these cases, your health care provider may recommend more frequent screening and Pap tests.   - The above recommendations are the same for women who have or have not gotten the vaccine for human papillomavirus (HPV).   - If you had a hysterectomy for a problem that was not cancer or a condition that could lead to cancer, then you no longer need Pap tests. Even if you no longer need a Pap test, a regular exam is a good idea to make sure no other problems are starting.   - If you are between ages 48 and 37 years, and you have had normal Pap tests going back 10 years, you no longer need Pap tests. Even if you no longer need a Pap test, a regular exam is a good idea to make sure no other problems are starting.   - If you have had past  treatment for cervical cancer or a condition that could lead to cancer, you need Pap tests and screening for cancer for at least 20 years after your treatment.   - If Pap tests have been discontinued, risk factors (such as a new sexual partner) need to be reassessed to determine if screening should be resumed.   - The HPV test is an additional test that may be used for cervical cancer screening. The HPV test looks for the virus that can cause the cell changes on the cervix. The cells collected during the Pap test can be tested for HPV. The HPV test could be used to screen women aged 33 years and older, and should be used in women of any age who have unclear Pap test results. After the age of 79, women should have HPV testing at the same frequency as a Pap test.   Colorectal cancer can be detected and often prevented. Most routine colorectal cancer screening begins at the age of 57 years and continues through age 60 years. However, your health care provider may recommend screening at an earlier age if you have risk factors for colon cancer. On a yearly basis, your health care provider may provide home test kits to check for hidden blood  in the stool.  Use of a small camera at the end of a tube, to directly examine the colon (sigmoidoscopy or colonoscopy), can detect the earliest forms of colorectal cancer. Talk to your health care provider about this at age 33, when routine screening begins. Direct exam of the colon should be repeated every 5 -10 years through age 68 years, unless early forms of pre-cancerous polyps or small growths are found.   People who are at an increased risk for hepatitis B should be screened for this virus. You are considered at high risk for hepatitis B if:  -You were born in a country where hepatitis B occurs often. Talk with your health care provider about which countries are considered high risk.  - Your parents were born in a high-risk country and you have not received a shot  to protect against hepatitis B (hepatitis B vaccine).  - You have HIV or AIDS.  - You use needles to inject street drugs.  - You live with, or have sex with, someone who has Hepatitis B.  - You get hemodialysis treatment.  - You take certain medicines for conditions like cancer, organ transplantation, and autoimmune conditions.   Hepatitis C blood testing is recommended for all people born from [birth year] through 1967 and any individual with known risks for hepatitis C.   Practice safe sex. Use condoms and avoid high-risk sexual practices to reduce the spread of sexually transmitted infections (STIs). STIs include gonorrhea, chlamydia, syphilis, trichomonas, herpes, HPV, and human immunodeficiency virus (HIV). Herpes, HIV, and HPV are viral illnesses that have no cure. They can result in disability, cancer, and death. Sexually active women aged 17 years and younger should be checked for chlamydia. Older women with new or multiple partners should also be tested for chlamydia. Testing for other STIs is recommended if you are sexually active and at increased risk.   Osteoporosis is a disease in which the bones lose minerals and strength with aging. This can result in serious bone fractures or breaks. The risk of osteoporosis can be identified using a bone density scan. Women ages 8 years and over and women at risk for fractures or osteoporosis should discuss screening with their health care providers. Ask your health care provider whether you should take a calcium supplement or vitamin D to There are also several preventive steps women can take to avoid osteoporosis and resulting fractures or to keep osteoporosis from worsening. -->Recommendations include:  Eat a balanced diet high in fruits, vegetables, calcium, and vitamins.  Get enough calcium. The recommended total intake of is 1,200 mg daily; for best absorption, if taking supplements, divide doses into 250-500 mg doses throughout the day. Of the two  types of calcium, calcium carbonate is best absorbed when taken with food but calcium citrate can be taken on an empty stomach.  Get enough vitamin D. [employer] and the [place] recommend at least 1,000 IU per day for women age 70 and over who are at risk of vitamin D deficiency. Vitamin D deficiency can be caused by inadequate sun exposure (for example, those who live in [place]).  Avoid alcohol and smoking. Heavy alcohol intake (more than 7 drinks per week) increases the risk of falls and hip fracture and women smokers tend to lose bone more rapidly and have lower bone mass than nonsmokers. Stopping smoking is one of the most important changes women can make to improve their health and decrease risk for disease.  Be physically active every  day. Weight-bearing exercise (for example, fast walking, hiking, jogging, and weight training) may strengthen bones or slow the rate of bone loss that comes with aging. Balancing and muscle-strengthening exercises can reduce the risk of falling and fracture.  Consider therapeutic medications. Currently, several types of effective drugs are available. Healthcare providers can recommend the type most appropriate for each woman.  Eliminate environmental factors that may contribute to accidents. Falls cause nearly 90% of all osteoporotic fractures, so reducing this risk is an important bone-health strategy. Measures include ample lighting, removing obstructions to walking, using nonskid rugs on floors, and placing mats and/or grab bars in showers.  Be aware of medication side effects. Some common medicines make bones weaker. These include a type of steroid drug called glucocorticoids used for arthritis and asthma, some antiseizure drugs, certain sleeping pills, treatments for endometriosis, and some cancer drugs. An overactive thyroid gland or using too much thyroid hormone for an underactive thyroid can also be a problem. If you are taking  these medicines, talk to your doctor about what you can do to help protect your bones.reduce the rate of osteoporosis.    Menopause can be associated with physical symptoms and risks. Hormone replacement therapy is available to decrease symptoms and risks. You should talk to your health care provider about whether hormone replacement therapy is right for you.   Use sunscreen. Apply sunscreen liberally and repeatedly throughout the day. You should seek shade when your shadow is shorter than you. Protect yourself by wearing long sleeves, pants, a wide-brimmed hat, and sunglasses year round, whenever you are outdoors.   Once a month, do a whole body skin exam, using a mirror to look at the skin on your back. Tell your health care provider of new moles, moles that have irregular borders, moles that are larger than a pencil eraser, or moles that have changed in shape or color.   -Stay current with required vaccines (immunizations).   Influenza vaccine. All adults should be immunized every year.  Tetanus, diphtheria, and acellular pertussis (Td, Tdap) vaccine. Pregnant women should receive 1 dose of Tdap vaccine during each pregnancy. The dose should be obtained regardless of the length of time since the last dose. Immunization is preferred during the 27th 36th week of gestation. An adult who has not previously received Tdap or who does not know her vaccine status should receive 1 dose of Tdap. This initial dose should be followed by tetanus and diphtheria toxoids (Td) booster doses every 10 years. Adults with an unknown or incomplete history of completing a 3-dose immunization series with Td-containing vaccines should begin or complete a primary immunization series including a Tdap dose. Adults should receive a Td booster every 10 years.  Varicella vaccine. An adult without evidence of immunity to varicella should receive 2 doses or a second dose if she has previously received 1 dose. Pregnant females  who do not have evidence of immunity should receive the first dose after pregnancy. This first dose should be obtained before leaving the health care facility. The second dose should be obtained 4 8 weeks after the first dose.  Human papillomavirus (HPV) vaccine. Females aged 81 26 years who have not received the vaccine previously should obtain the 3-dose series. The vaccine is not recommended for use in pregnant females. However, pregnancy testing is not needed before receiving a dose. If a female is found to be pregnant after receiving a dose, no treatment is needed. In that case, the remaining doses should be delayed  until after the pregnancy. Immunization is recommended for any person with an immunocompromised condition through the age of 73 years if she did not get any or all doses earlier. During the 3-dose series, the second dose should be obtained 4 8 weeks after the first dose. The third dose should be obtained 24 weeks after the first dose and 16 weeks after the second dose.  Zoster vaccine. One dose is recommended for adults aged 32 years or older unless certain conditions are present.  Measles, mumps, and rubella (MMR) vaccine. Adults born before [birth year] generally are considered immune to measles and mumps. Adults born in [birth year] or later should have 1 or more doses of MMR vaccine unless there is a contraindication to the vaccine or there is laboratory evidence of immunity to each of the three diseases. A routine second dose of MMR vaccine should be obtained at least 28 days after the first dose for students attending postsecondary schools, health care workers, or international travelers. People who received inactivated measles vaccine or an unknown type of measles vaccine during 1963 1967 should receive 2 doses of MMR vaccine. People who received inactivated mumps vaccine or an unknown type of mumps vaccine before 1979 and are at high risk for mumps infection should consider immunization with 2 doses  of MMR vaccine. For females of childbearing age, rubella immunity should be determined. If there is no evidence of immunity, females who are not pregnant should be vaccinated. If there is no evidence of immunity, females who are pregnant should delay immunization until after pregnancy. Unvaccinated health care workers born before [birth year] who lack laboratory evidence of measles, mumps, or rubella immunity or laboratory confirmation of disease should consider measles and mumps immunization with 2 doses of MMR vaccine or rubella immunization with 1 dose of MMR vaccine.  Pneumococcal 13-valent conjugate (PCV13) vaccine. When indicated, a person who is uncertain of her immunization history and has no record of immunization should receive the PCV13 vaccine. An adult aged 29 years or older who has certain medical conditions and has not been previously immunized should receive 1 dose of PCV13 vaccine. This PCV13 should be followed with a dose of pneumococcal polysaccharide (PPSV23) vaccine. The PPSV23 vaccine dose should be obtained at least 8 weeks after the dose of PCV13 vaccine. An adult aged 12 years or older who has certain medical conditions and previously received 1 or more doses of PPSV23 vaccine should receive 1 dose of PCV13. The PCV13 vaccine dose should be obtained 1 or more years after the last PPSV23 vaccine dose.  Pneumococcal polysaccharide (PPSV23) vaccine. When PCV13 is also indicated, PCV13 should be obtained first. All adults aged 72 years and older should be immunized. An adult younger than age 30 years who has certain medical conditions should be immunized. Any person who resides in a nursing home or long-term care facility should be immunized. An adult smoker should be immunized. People with an immunocompromised condition and certain other conditions should receive both PCV13 and PPSV23 vaccines. People with human immunodeficiency virus (HIV) infection should be immunized as soon as possible after  diagnosis. Immunization during chemotherapy or radiation therapy should be avoided. Routine use of PPSV23 vaccine is not recommended for American Indians, [place]atives, or people younger than 65 years unless there are medical conditions that require PPSV23 vaccine. When indicated, people who have unknown immunization and have no record of immunization should receive PPSV23 vaccine. One-time revaccination 5 years after the first dose of PPSV23 is recommended for people  aged 28 64 years who have chronic kidney failure, nephrotic syndrome, asplenia, or immunocompromised conditions. People who received 1 2 doses of PPSV23 before age 65 years should receive another dose of PPSV23 vaccine at age 42 years or later if at least 5 years have passed since the previous dose. Doses of PPSV23 are not needed for people immunized with PPSV23 at or after age 21 years.  Meningococcal vaccine. Adults with asplenia or persistent complement component deficiencies should receive 2 doses of quadrivalent meningococcal conjugate (MenACWY-D) vaccine. The doses should be obtained at least 2 months apart. Microbiologists working with certain meningococcal bacteria, [employer] recruits, people at risk during an outbreak, and people who travel to or live in countries with a high rate of meningitis should be immunized. A first-year college student up through age 75 years who is living in a residence hall should receive a dose if she did not receive a dose on or after her 16th birthday. Adults who have certain high-risk conditions should receive one or more doses of vaccine.  Hepatitis A vaccine. Adults who wish to be protected from this disease, have certain high-risk conditions, work with hepatitis A-infected animals, work in hepatitis A research labs, or travel to or work in countries with a high rate of hepatitis A should be immunized. Adults who were previously unvaccinated and who anticipate close contact with an international adoptee  during the first 60 days after arrival in the [place] from a country with a high rate of hepatitis A should be immunized.  Hepatitis B vaccine.  Adults who wish to be protected from this disease, have certain high-risk conditions, may be exposed to blood or other infectious body fluids, are household contacts or sex partners of hepatitis B positive people, are clients or workers in certain care facilities, or travel to or work in countries with a high rate of hepatitis B should be immunized.  Haemophilus influenzae type b (Hib) vaccine. A previously unvaccinated person with asplenia or sickle cell disease or having a scheduled splenectomy should receive 1 dose of Hib vaccine. Regardless of previous immunization, a recipient of a hematopoietic stem cell transplant should receive a 3-dose series 6 12 months after her successful transplant. Hib vaccine is not recommended for adults with HIV infection.  Preventive Services / Frequency Ages 67 to 39years  Blood pressure check.** / Every 1 to 2 years.  Lipid and cholesterol check.** / Every 5 years beginning at age 63.  Clinical breast exam.** / Every 3 years for women in their 73s and 55s.  BRCA-related cancer risk assessment.** / For women who have family members with a BRCA-related cancer (breast, ovarian, tubal, or peritoneal cancers).  Pap test.** / Every 2 years from ages 48 through 46. Every 3 years starting at age 68 through age 17 or 21 with a history of 3 consecutive normal Pap tests.  HPV screening.** / Every 3 years from ages 27 through ages 54 to 48 with a history of 3 consecutive normal Pap tests.  Hepatitis C blood test.** / For any individual with known risks for hepatitis C.  Skin self-exam. / Monthly.  Influenza vaccine. / Every year.  Tetanus, diphtheria, and acellular pertussis (Tdap, Td) vaccine.** / Consult your health care provider. Pregnant women should receive 1 dose of Tdap vaccine during each pregnancy. 1 dose of  Td every 10 years.  Varicella vaccine.** / Consult your health care provider. Pregnant females who do not have evidence of immunity should receive the first dose after  pregnancy.  HPV vaccine. / 3 doses over 6 months, if 4 and younger. The vaccine is not recommended for use in pregnant females. However, pregnancy testing is not needed before receiving a dose.  Measles, mumps, rubella (MMR) vaccine.** / You need at least 1 dose of MMR if you were born in 1957 or later. You may also need a 2nd dose. For females of childbearing age, rubella immunity should be determined. If there is no evidence of immunity, females who are not pregnant should be vaccinated. If there is no evidence of immunity, females who are pregnant should delay immunization until after pregnancy.  Pneumococcal 13-valent conjugate (PCV13) vaccine.** / Consult your health care provider.  Pneumococcal polysaccharide (PPSV23) vaccine.** / 1 to 2 doses if you smoke cigarettes or if you have certain conditions.  Meningococcal vaccine.** / 1 dose if you are age 40 to 32 years and a Market researcher living in a residence hall, or have one of several medical conditions, you need to get vaccinated against meningococcal disease. You may also need additional booster doses.  Hepatitis A vaccine.** / Consult your health care provider.  Hepatitis B vaccine.** / Consult your health care provider.  Haemophilus influenzae type b (Hib) vaccine.** / Consult your health care provider.  Ages 39 to 64years  Blood pressure check.** / Every 1 to 2 years.  Lipid and cholesterol check.** / Every 5 years beginning at age 28 years.  Lung cancer screening. / Every year if you are aged 90 80 years and have a 30-pack-year history of smoking and currently smoke or have quit within the past 15 years. Yearly screening is stopped once you have quit smoking for at least 15 years or develop a health problem that would prevent you from having lung  cancer treatment.  Clinical breast exam.** / Every year after age 41 years.  BRCA-related cancer risk assessment.** / For women who have family members with a BRCA-related cancer (breast, ovarian, tubal, or peritoneal cancers).  Mammogram.** / Every year beginning at age 31 years and continuing for as long as you are in good health. Consult with your health care provider.  Pap test.** / Every 3 years starting at age 60 years through age 65 or 76 years with a history of 3 consecutive normal Pap tests.  HPV screening.** / Every 3 years from ages 26 years through ages 72 to 72 years with a history of 3 consecutive normal Pap tests.  Fecal occult blood test (FOBT) of stool. / Every year beginning at age 82 years and continuing until age 61 years. You may not need to do this test if you get a colonoscopy every 10 years.  Flexible sigmoidoscopy or colonoscopy.** / Every 5 years for a flexible sigmoidoscopy or every 10 years for a colonoscopy beginning at age 49 years and continuing until age 28 years.  Hepatitis C blood test.** / For all people born from [birth year] through 1967 and any individual with known risks for hepatitis C.  Skin self-exam. / Monthly.  Influenza vaccine. / Every year.  Tetanus, diphtheria, and acellular pertussis (Tdap/Td) vaccine.** / Consult your health care provider. Pregnant women should receive 1 dose of Tdap vaccine during each pregnancy. 1 dose of Td every 10 years.  Varicella vaccine.** / Consult your health care provider. Pregnant females who do not have evidence of immunity should receive the first dose after pregnancy.  Zoster vaccine.** / 1 dose for adults aged 49 years or older.  Measles, mumps, rubella (MMR) vaccine.** /  You need at least 1 dose of MMR if you were born in 1957 or later. You may also need a 2nd dose. For females of childbearing age, rubella immunity should be determined. If there is no evidence of immunity, females who are not pregnant should be  vaccinated. If there is no evidence of immunity, females who are pregnant should delay immunization until after pregnancy.  Pneumococcal 13-valent conjugate (PCV13) vaccine.** / Consult your health care provider.  Pneumococcal polysaccharide (PPSV23) vaccine.** / 1 to 2 doses if you smoke cigarettes or if you have certain conditions.  Meningococcal vaccine.** / Consult your health care provider.  Hepatitis A vaccine.** / Consult your health care provider.  Hepatitis B vaccine.** / Consult your health care provider.  Haemophilus influenzae type b (Hib) vaccine.** / Consult your health care provider.  Ages 56 years and over  Blood pressure check.** / Every 1 to 2 years.  Lipid and cholesterol check.** / Every 5 years beginning at age 41 years.  Lung cancer screening. / Every year if you are aged 34 80 years and have a 30-pack-year history of smoking and currently smoke or have quit within the past 15 years. Yearly screening is stopped once you have quit smoking for at least 15 years or develop a health problem that would prevent you from having lung cancer treatment.  Clinical breast exam.** / Every year after age 81 years.  BRCA-related cancer risk assessment.** / For women who have family members with a BRCA-related cancer (breast, ovarian, tubal, or peritoneal cancers).  Mammogram.** / Every year beginning at age 81 years and continuing for as long as you are in good health. Consult with your health care provider.  Pap test.** / Every 3 years starting at age 26 years through age 77 or 24 years with 3 consecutive normal Pap tests. Testing can be stopped between 65 and 70 years with 3 consecutive normal Pap tests and no abnormal Pap or HPV tests in the past 10 years.  HPV screening.** / Every 3 years from ages 65 years through ages 37 or 11 years with a history of 3 consecutive normal Pap tests. Testing can be stopped between 65 and 70 years with 3 consecutive normal Pap tests and no  abnormal Pap or HPV tests in the past 10 years.  Fecal occult blood test (FOBT) of stool. / Every year beginning at age 47 years and continuing until age 55 years. You may not need to do this test if you get a colonoscopy every 10 years.  Flexible sigmoidoscopy or colonoscopy.** / Every 5 years for a flexible sigmoidoscopy or every 10 years for a colonoscopy beginning at age 1 years and continuing until age 34 years.  Hepatitis C blood test.** / For all people born from [birth year] through 1967 and any individual with known risks for hepatitis C.  Osteoporosis screening.** / A one-time screening for women ages 40 years and over and women at risk for fractures or osteoporosis.  Skin self-exam. / Monthly.  Influenza vaccine. / Every year.  Tetanus, diphtheria, and acellular pertussis (Tdap/Td) vaccine.** / 1 dose of Td every 10 years.  Varicella vaccine.** / Consult your health care provider.  Zoster vaccine.** / 1 dose for adults aged 25 years or older.  Pneumococcal 13-valent conjugate (PCV13) vaccine.** / Consult your health care provider.  Pneumococcal polysaccharide (PPSV23) vaccine.** / 1 dose for all adults aged 71 years and older.  Meningococcal vaccine.** / Consult your health care provider.  Hepatitis A vaccine.** /  Consult your health care provider.  Hepatitis B vaccine.** / Consult your health care provider.  Haemophilus influenzae type b (Hib) vaccine.** / Consult your health care provider. ** Family history and personal history of risk and conditions may change your health care provider's recommendations. Document Released: 08/02/2001 Document Revised: 03/27/2013  [facility] Patient Information 2014 [place], [place].   EXERCISE AND DIET:  We recommended that you start or continue a regular exercise program for good health. Regular exercise means any activity that makes your heart beat faster and makes you sweat.  We recommend exercising at least 30 minutes per day at least 3  days a week, preferably 5.  We also recommend a diet low in fat and sugar / carbohydrates.  Inactivity, poor dietary choices and obesity can cause diabetes, heart attack, stroke, and kidney damage, among others.     ALCOHOL AND SMOKING:  Women should limit their alcohol intake to no more than 7 drinks/beers/glasses of wine (combined, not each!) per week. Moderation of alcohol intake to this level decreases your risk of breast cancer and liver damage.  ( And of course, no recreational drugs are part of a healthy lifestyle.)  Also, you should not be smoking at all or even being exposed to second hand smoke. Most people know smoking can cause cancer, and various heart and lung diseases, but did you know it also contributes to weakening of your bones?  Aging of your skin?  Yellowing of your teeth and nails?   CALCIUM AND VITAMIN D:  Adequate intake of calcium and Vitamin D are recommended.  The recommendations for exact amounts of these supplements seem to change often, but generally speaking 600 mg of calcium (either carbonate or citrate) and 800 units of Vitamin D per day seems prudent. Certain women may benefit from higher intake of Vitamin D.  If you are among these women, your doctor will have told you during your visit.     PAP SMEARS:  Pap smears, to check for cervical cancer or precancers,  have traditionally been done yearly, although recent scientific advances have shown that most women can have pap smears less often.  However, every woman still should have a physical exam from her gynecologist or primary care physician every year. It will include a breast check, inspection of the vulva and vagina to check for abnormal growths or skin changes, a visual exam of the cervix, and then an exam to evaluate the size and shape of the uterus and ovaries.  And after 71 years of age, a rectal exam is indicated to check for rectal cancers. We will also provide age appropriate advice regarding health  maintenance, like when you should have certain vaccines, screening for sexually transmitted diseases, bone density testing, colonoscopy, mammograms, etc.    MAMMOGRAMS:  All women over 38 years old should have a yearly mammogram. Many facilities now offer a "3D" mammogram, which may cost around $50 extra out of pocket. If possible,  we recommend you accept the option to have the 3D mammogram performed.  It both reduces the number of women who will be called back for extra views which then turn out to be normal, and it is better than the routine mammogram at detecting truly abnormal areas.     COLONOSCOPY:  Colonoscopy to screen for colon cancer is recommended for all women at age 27.  We know, you hate the idea of the prep.  We agree, BUT, having colon cancer and not knowing it is worse!!  Colon cancer so often starts as a polyp that can be seen and removed at colonscopy, which can quite literally save your life!  And if your first colonoscopy is normal and you have no family history of colon cancer, most women don't have to have it again for 10 years.  Once every ten years, you can do something that may end up saving your life, right?  We will be happy to help you get it scheduled when you are ready.  Be sure to check your insurance coverage so you understand how much it will cost.  It may be covered as a preventative service at no cost, but you should check your particular policy.

## 2019-07-20 LAB — COMPREHENSIVE METABOLIC PANEL
ALT: 13 IU/L (ref 0–32)
AST: 18 IU/L (ref 0–40)
Albumin/Globulin Ratio: 1.9 (ref 1.2–2.2)
Albumin: 4.1 g/dL (ref 3.8–4.8)
Alkaline Phosphatase: 70 IU/L (ref 39–117)
BUN/Creatinine Ratio: 22 (ref 12–28)
BUN: 20 mg/dL (ref 8–27)
Bilirubin Total: 0.3 mg/dL (ref 0.0–1.2)
CO2: 24 mmol/L (ref 20–29)
Calcium: 9.5 mg/dL (ref 8.7–10.3)
Chloride: 108 mmol/L — ABNORMAL HIGH (ref 96–106)
Creatinine, Ser: 0.89 mg/dL (ref 0.57–1.00)
GFR calc Af Amer: 76 mL/min/{1.73_m2} (ref >59)
GFR calc non Af Amer: 66 mL/min/{1.73_m2} (ref >59)
Globulin, Total: 2.2 g/dL (ref 1.5–4.5)
Glucose: 128 mg/dL — ABNORMAL HIGH (ref 65–99)
Potassium: 4.4 mmol/L (ref 3.5–5.2)
Sodium: 143 mmol/L (ref 134–144)
Total Protein: 6.3 g/dL (ref 6.0–8.5)

## 2019-07-20 LAB — HEMOGLOBIN A1C
Est. average glucose Bld gHb Est-mCnc: 128 mg/dL
Hgb A1c MFr Bld: 6.1 % — ABNORMAL HIGH (ref 4.8–5.6)

## 2019-07-20 LAB — VITAMIN D 25 HYDROXY (VIT D DEFICIENCY, FRACTURES): Vit D, 25-Hydroxy: 87.8 ng/mL (ref 30.0–100.0)

## 2019-07-20 LAB — CBC WITH DIFFERENTIAL/PLATELET
Basophils Absolute: 0.1 10*3/uL (ref 0.0–0.2)
Basos: 1 %
EOS (ABSOLUTE): 0.1 10*3/uL (ref 0.0–0.4)
Eos: 3 %
Hematocrit: 42.6 % (ref 34.0–46.6)
Hemoglobin: 13.6 g/dL (ref 11.1–15.9)
Immature Grans (Abs): 0 10*3/uL (ref 0.0–0.1)
Immature Granulocytes: 0 %
Lymphocytes Absolute: 1.3 10*3/uL (ref 0.7–3.1)
Lymphs: 24 %
MCH: 29.8 pg (ref 26.6–33.0)
MCHC: 31.9 g/dL (ref 31.5–35.7)
MCV: 93 fL (ref 79–97)
Monocytes Absolute: 0.5 10*3/uL (ref 0.1–0.9)
Monocytes: 9 %
Neutrophils Absolute: 3.4 10*3/uL (ref 1.4–7.0)
Neutrophils: 63 %
Platelets: 264 10*3/uL (ref 150–450)
RBC: 4.56 x10E6/uL (ref 3.77–5.28)
RDW: 13.8 % (ref 11.7–15.4)
WBC: 5.3 10*3/uL (ref 3.4–10.8)

## 2019-07-20 LAB — T4, FREE: Free T4: 0.83 ng/dL (ref 0.82–1.77)

## 2019-07-20 LAB — LIPID PANEL
Chol/HDL Ratio: 3.3 ratio (ref 0.0–4.4)
Cholesterol, Total: 169 mg/dL (ref 100–199)
HDL: 51 mg/dL (ref >39)
LDL Chol Calc (NIH): 95 mg/dL (ref 0–99)
Triglycerides: 130 mg/dL (ref 0–149)
VLDL Cholesterol Cal: 23 mg/dL (ref 5–40)

## 2019-07-20 LAB — TSH: TSH: 2.63 u[IU]/mL (ref 0.450–4.500)

## 2019-07-20 LAB — HEPATITIS C ANTIBODY: Hep C Virus Ab: <0.1 s/co ratio (ref 0.0–0.9)

## 2019-07-23 ENCOUNTER — Other Ambulatory Visit: Payer: Self-pay | Admitting: Family Medicine

## 2019-07-23 DIAGNOSIS — E785 Hyperlipidemia, unspecified: Secondary | ICD-10-CM

## 2019-07-23 MED ORDER — ROSUVASTATIN CALCIUM 40 MG PO TABS: 20.0000 mg | ORAL_TABLET | Freq: Every day | ORAL | 1 refills | Status: DC

## 2019-07-24 ENCOUNTER — Telehealth: Payer: Self-pay | Admitting: Family Medicine

## 2019-07-24 DIAGNOSIS — E785 Hyperlipidemia, unspecified: Secondary | ICD-10-CM

## 2019-07-24 NOTE — Telephone Encounter (Signed)
Patient called our office back and stated that she was taking the Crestor 10mg  before. I advised patient that we sent in for Crestor 40 mg take 1/2 tab daily at bedtime. Patient seemed very confused with the directions and I had to reiterate this to her 4 times before she understood. I advised patient to pick up the new rx and take it the way it is directed on the bottle and she stated she understood. Patient aware to come back for future labs and the orders have been placed. AS, CMA

## 2019-07-24 NOTE — Telephone Encounter (Signed)
-----   Message from Mellody Dance, DO sent at 07/23/2019  1:15 PM EST ----- No, she has not been on that dose, that was the new prescription that I told you I already sent in for her.    I just increased it from 10mg  to 20 mg nightly.  Next time I will not send in the new prescription until you speak with the patient in order to avoid any confusion.  My apologies.

## 2019-07-26 ENCOUNTER — Other Ambulatory Visit: Payer: Self-pay | Admitting: Family Medicine

## 2019-07-29 ENCOUNTER — Other Ambulatory Visit: Payer: Self-pay | Admitting: Family Medicine

## 2019-07-29 DIAGNOSIS — I10 Essential (primary) hypertension: Secondary | ICD-10-CM

## 2019-07-30 ENCOUNTER — Other Ambulatory Visit: Payer: Self-pay | Admitting: Family Medicine

## 2019-07-30 DIAGNOSIS — J3089 Other allergic rhinitis: Secondary | ICD-10-CM

## 2019-07-30 DIAGNOSIS — J45909 Unspecified asthma, uncomplicated: Secondary | ICD-10-CM

## 2019-08-15 ENCOUNTER — Other Ambulatory Visit: Payer: Self-pay

## 2019-08-15 ENCOUNTER — Ambulatory Visit (INDEPENDENT_AMBULATORY_CARE_PROVIDER_SITE_OTHER): Payer: Medicare HMO | Admitting: Family Medicine

## 2019-08-15 ENCOUNTER — Encounter: Payer: Self-pay | Admitting: Family Medicine

## 2019-08-15 VITALS — BP 125/80 | Temp 97.6°F | Ht 62.0 in | Wt 182.0 lb

## 2019-08-15 DIAGNOSIS — J449 Chronic obstructive pulmonary disease, unspecified: Secondary | ICD-10-CM | POA: Diagnosis not present

## 2019-08-15 DIAGNOSIS — Z720 Tobacco use: Secondary | ICD-10-CM

## 2019-08-15 DIAGNOSIS — K219 Gastro-esophageal reflux disease without esophagitis: Secondary | ICD-10-CM | POA: Diagnosis not present

## 2019-08-15 DIAGNOSIS — Z716 Tobacco abuse counseling: Secondary | ICD-10-CM | POA: Diagnosis not present

## 2019-08-15 MED ORDER — OMEPRAZOLE 40 MG PO CPDR: 40.0000 mg | DELAYED_RELEASE_CAPSULE | Freq: Every day | ORAL | 0 refills | Status: DC

## 2019-08-15 NOTE — Progress Notes (Signed)
Telehealth office visit note for Tiffany Murphy, D.O- at Primary Care at [facility]   I connected with current patient today and verified that I am speaking with the correct person using two identifiers.   . Location of the patient: Home . Location of the provider: Office Only the patient (+/- their family members at pt's discretion) and myself were participating in the encounter - This visit type was conducted due to national recommendations for restrictions regarding the COVID-19 Pandemic (e.g. social distancing) in an effort to limit this patient's exposure and mitigate transmission in our community.  This format is felt to be most appropriate for this patient at this time.   - No physical exam could be performed with this format, beyond that communicated to [place] by the patient/ family members as noted.   - Additionally my office staff/ schedulers discussed with the patient that there may be a monetary charge related to this service, depending on their medical insurance.   The patient expressed understanding, and agreed to proceed.     _________________________________________________________________________________   History of Present Illness: Nicotine Dependence and Depression    I, Tiffany Murphy, am serving as Education administrator for [employer].   Notes "I'm okay I guess, just the same old me."  - Smoking Cessation Says that the Chantix is helping a whole lot.  Notes "I still haven't completely quit."  Says she's lasted up to 4 days without something at all, and then ends up smoking a cigarette.  She never lets herself smoke more than one or two cigarettes when she falls off the wagon.  Notes "I want to quit and I'm not going to allow that."  Notes she knows if she can go four days, she can go more days.  "I just have so much stress around me, and my husband smokes around me."  Confirms she started the Chantix right around last OV January 29th.  Notes she's had several quit  dates in the past, but has failed.  Says "I want to quit soon, but I don't know if I can or not."  She's been taking the Chantix for almost one month.  She is tolerating the Chantix well.  Notes "it isn't like it was before when I was taking it (30-40 years ago)."  Says last time she tried a medication to quit smoking, she had terrible nightmares.  Says last time, even taking a puff of one cigarette would make her sick on her stomach.  She has not visited the Chantix website for further information.  Due to the cold weather, she has not begun a daily walking routine.    GAD 7 : Generalized Anxiety Score 07/19/2019 11/13/2018  Nervous, Anxious, on Edge 2 2  Control/stop worrying 2 1  Worry too much - different things 3 0  Trouble relaxing 3 2  Restless 3 3  Easily annoyed or irritable 0 0  Afraid - awful might happen 1 1  Total GAD 7 Score 14 9  Anxiety Difficulty Somewhat difficult Somewhat difficult    Depression screen [facility] 2/9 08/15/2019 07/19/2019 02/21/2019 11/13/2018 07/12/2018  Decreased Interest 3 2 1  0 3  Down, Depressed, Hopeless 2 2 1 1 3   PHQ - 2 Score 5 4 2 1 6   Altered sleeping 2 3 3 2 3   Tired, decreased energy 2 3 3 1 3   Change in appetite 1 0 0 0 3  Feeling bad or failure about yourself  2 1 0  0 -  Trouble concentrating 2 1 3  0 1  Moving slowly or fidgety/restless 3 1 0 3 0  Suicidal thoughts 0 0 0 0 0  PHQ-9 Score 17 13 11 7 16   Difficult doing work/chores Somewhat difficult Extremely dIfficult Very difficult Somewhat difficult -     Impression and Recommendations:    1. Tobacco use   2. Tobacco abuse counseling   3. Chronic obstructive pulmonary disease, unspecified COPD type ([place])   4. Gastroesophageal reflux disease, unspecified whether esophagitis present      Tobacco Use, Tobacco Abuse Counseling, Smoking Cessation - Patient started Chantix after last OV 07/19/2019.  - Reviewed smoking cessation goals and appropriate use of Chantix with patient. -  Patient knows to take Chantix as prescribed.  Reviewed instructions with patient. - Education provided and all questions answered.  - Encouraged patient to ask her husband to smoke outside instead of near the patient if he desires to smoke.  - Discussed that patients on Chantix usually tend to smoke for about 2-4 weeks, and then end up quitting completely.  Encouraged patient to stay the course.   Advised patient to count out exactly four weeks from when she started the Chantix, and put her cigarettes down and never pick them back up again.  - Advised patient to look on the Chantix website online for tips and tricks to help her accomplish her goals.  - Told to call 1-800-QUIT-NOW (660)147-0821) for free smoking cessation counseling and support, or pt can go online to www.heart.org - the American Heart Association website and search "quit smoking."  - Encouraged patient to set a quit date.  - Will continue to monitor.  Recommendations - Goal: completely quit smoking 4 weeks after starting Chantix. - Set up her environment to succeed with smoking cessation. - Visit Chantix website for tips and tricks to aid with cessation.  - Follow-up in 6 weeks.  - As part of my medical decision making, I reviewed the following data within the [place] History obtained from pt /family, CMA notes reviewed and incorporated if applicable, Labs reviewed, Radiograph/ tests reviewed if applicable and OV notes from prior OV's with me, as well as other specialists she/he has seen since seeing me last, were all reviewed and used in my medical decision making process today.    - Additionally, discussion had with patient regarding our treatment plan, and their biases/concerns about that plan were used in my medical decision making today.    - The patient agreed with the plan and demonstrated an understanding of the instructions.   No barriers to understanding were identified.     Return for  f/up regarding smoking cessation in 6 weeks.    Meds ordered this encounter  Medications  . omeprazole (PRILOSEC) 40 MG capsule    Sig: Take 1 capsule (40 mg total) by mouth daily.    Dispense:  90 capsule    Refill:  0    Medications Discontinued During This Encounter  Medication Reason  . dicyclomine (BENTYL) 10 MG capsule Dose change  . varenicline (CHANTIX STARTING MONTH PAK) 0.5 MG X 11 & 1 MG X 42 tablet Dose change  . omeprazole (PRILOSEC) 40 MG capsule Reorder     Time spent on visit including pre-visit chart review and post-visit care was 18+ minutes.  Note:  This note was prepared with assistance of Dragon voice recognition software. Occasional wrong-word or sound-a-like substitutions may have occurred due to the inherent limitations of voice  recognition software.   The [place] was signed into law in 2016 which includes the topic of electronic health records.  This provides immediate access to information in MyChart.  This includes consultation notes, operative notes, office notes, lab results and pathology reports.  If you have any questions about what you read please let us know at your next visit or call us at the office.  We are right here with you.   This document serves as a record of services personally performed by Tiffany Dance, DO. It was created on her behalf by Tiffany Murphy, a trained medical scribe. The creation of this record is based on the scribe's personal observations and the provider's statements to them.   This case required medical decision making of at least moderate complexity. The above documentation from Tiffany Murphy, medical scribe, has been reviewed by Marjory Sneddon, D.O. __________________________________________________________________________________________________________________________________     Patient Care Team    Relationship Specialty Notifications Start End  Tiffany Dance, DO PCP - General  Family Medicine  04/11/18   Minus Breeding, MD Consulting Physician Cardiology  04/11/18   Jackquline Denmark, MD Consulting Physician Gastroenterology  04/11/18   Jolene Schimke    04/11/18   Yvone Neu, MD Referring Physician Family Medicine  04/11/18   Laurin Coder, MD Consulting Physician Pulmonary Disease  07/12/18   Marchia Bond, MD Consulting Physician Orthopedic Surgery  11/13/18      -Vitals obtained; medications/ allergies reconciled;  personal medical, social, Sx etc.histories were updated by CMA, reviewed by me and are reflected in chart   Patient Active Problem List   Diagnosis Date Noted  . Hyperlipidemia with target LDL less than 70 11/13/2018  . Chronic obstructive pulmonary disease ([place]) 04/11/2018  . Glucose intolerance (impaired glucose tolerance) 04/11/2018  . Mood disorder ([place]) 04/11/2018  . Benign essential HTN 07/11/2013  . Tobacco abuse counseling 07/12/2018  . OSA and COPD overlap syndrome ([place]) 04/30/2018  . Tobacco use disorder 04/11/2018  . GERD (gastroesophageal reflux disease) 04/11/2018  . Obesity, Class I, BMI 30-34.9 04/11/2018  . Sleep difficulties- poor sleep hygeine 07/12/2018  . Vitamin D deficiency 04/30/2018  . Panic attacks 04/11/2018  . Hypokalemia 04/11/2018  . Environmental and seasonal allergies 04/11/2018  . Subjective memory complaints 02/21/2019  . Frequency of urination- pt thinks due to HCTZ BP med 02/21/2019  . Closed fracture of left distal radius 10/25/2018  . Chronic pain disorder 07/12/2018  . Chronic joint pain 07/12/2018  . Chronic fatigue 04/30/2018  . Lung nodule seen on imaging study 04/30/2018  . Elevated LDL cholesterol level 04/11/2018  . Asthma 04/11/2018  . Irritable bowel syndrome 04/11/2018  . Chronic bilateral upper abdominal pain 04/11/2018  . Elevated coronary artery calcium score 12/01/2014  . Depression with anxiety 07/11/2013  . Pure hypercholesterolemia 07/11/2013  . Nicotine addiction  06/15/2013  . Overweight 06/15/2013     Current Meds  Medication Sig  . acyclovir (ZOVIRAX) 200 MG capsule Take 1 capsule by mouth daily.  Marland Kitchen albuterol (PROVENTIL HFA;VENTOLIN HFA) 108 (90 Base) MCG/ACT inhaler Inhale 2 puffs into the lungs every 6 (six) hours as needed for wheezing.  Marland Kitchen buPROPion (WELLBUTRIN SR) 150 MG 12 hr tablet Take 1 tablet (150 mg total) by mouth 2 (two) times daily.  Marland Kitchen dicyclomine (BENTYL) 20 MG tablet Take 1 tablet (20 mg total) by mouth 2 (two) times daily.  . fluticasone (FLONASE) 50 MCG/ACT nasal spray USE 2 SPRAYS IN EACH NOSTRIL EVERY DAY  .  Fluticasone-Umeclidin-Vilant (TRELEGY ELLIPTA) 100-62.5-25 MCG/INH AEPB Inhale 1 puff into the lungs daily.  Marland Kitchen gabapentin (NEURONTIN) 300 MG capsule TAKE 1 CAPSULE IN THE MORNING, 1 CAPSULE IN THE  AFTERNOON, AND 2 CAPSULES AT BEDTIME  . losartan (COZAAR) 100 MG tablet TAKE 1 TABLET BY MOUTH ONCE DAILY . APPOINTMENT REQUIRED FOR FUTURE REFILLS  . montelukast (SINGULAIR) 10 MG tablet TAKE 1 TABLET AT BEDTIME  . omeprazole (PRILOSEC) 40 MG capsule Take 1 capsule (40 mg total) by mouth daily.  . potassium chloride SA (KLOR-CON) 20 MEQ tablet Take 1 tablet (20 mEq total) by mouth 2 (two) times daily.  . rosuvastatin (CRESTOR) 40 MG tablet Take 0.5 tablets (20 mg total) by mouth at bedtime.  . sertraline (ZOLOFT) 100 MG tablet Take 2 tablets by mouth once daily  . varenicline (CHANTIX) 1 MG tablet Take 1 tablet (1 mg total) by mouth 2 (two) times daily.  . [DISCONTINUED] omeprazole (PRILOSEC) 40 MG capsule Take 1 capsule (40 mg total) by mouth daily.     Allergies:  Allergies  Allergen Reactions  . Penicillins Hives and Shortness Of Breath  . Lovastatin     Cramps in legs and feet  . Other     Fragrances in soaps, perfumes, shampoos, conditioners     ROS:  See above HPI for pertinent positives and negatives   Objective:   Blood pressure 125/80, temperature 97.6 F (36.4 C), temperature source Temporal, height 5'  2" (1.575 m), weight 182 lb (82.6 kg).  (if some vitals are omitted, this means that patient was UNABLE to obtain them even though they were asked to get them prior to OV today.  They were asked to call us at their earliest convenience with these once obtained. )  General: A & O * 3; sounds in no acute distress; in usual state of health.  Skin: Pt confirms warm and dry extremities and pink fingertips HEENT: Pt confirms lips non-cyanotic Chest: Patient confirms normal chest excursion and movement Respiratory: speaking in full sentences, no conversational dyspnea; patient confirms no use of accessory muscles Psych: insight appears good, mood- appears full

## 2019-08-16 ENCOUNTER — Other Ambulatory Visit: Payer: Self-pay | Admitting: Family Medicine

## 2019-08-16 ENCOUNTER — Other Ambulatory Visit: Payer: Self-pay | Admitting: Pulmonary Disease

## 2019-08-16 ENCOUNTER — Encounter: Payer: Self-pay | Admitting: Family Medicine

## 2019-08-16 DIAGNOSIS — M2550 Pain in unspecified joint: Secondary | ICD-10-CM

## 2019-08-16 DIAGNOSIS — G8929 Other chronic pain: Secondary | ICD-10-CM

## 2019-08-16 DIAGNOSIS — G894 Chronic pain syndrome: Secondary | ICD-10-CM

## 2019-08-27 DIAGNOSIS — H524 Presbyopia: Secondary | ICD-10-CM | POA: Diagnosis not present

## 2019-08-27 LAB — HM DIABETES EYE EXAM

## 2019-09-14 ENCOUNTER — Ambulatory Visit: Payer: Medicare HMO | Attending: Internal Medicine

## 2019-09-14 DIAGNOSIS — Z23 Encounter for immunization: Secondary | ICD-10-CM

## 2019-09-14 NOTE — Progress Notes (Signed)
   Covid-19 Vaccination Clinic  Name:  Tiffany Murphy    MRN: [id number] DOB: [birth year]  09/14/2019  [name] was observed post Covid-19 immunization for 30 minutes based on pre-vaccination screening without incident. She was provided with Vaccine Information Sheet and instruction to access the V-Safe system.   [name] was instructed to call 911 with any severe reactions post vaccine: Marland Kitchen Difficulty breathing  . Swelling of face and throat  . A fast heartbeat  . A bad rash all over body  . Dizziness and weakness   Immunizations Administered    Name Date Dose VIS Date Route   Pfizer COVID-19 Vaccine 09/14/2019 11:36 AM 0.3 mL 05/31/2019 Intramuscular   Manufacturer: [place]   Lot: [id number]   [place]: [id number]

## 2019-09-17 ENCOUNTER — Other Ambulatory Visit: Payer: Self-pay | Admitting: Family Medicine

## 2019-09-17 DIAGNOSIS — H524 Presbyopia: Secondary | ICD-10-CM | POA: Diagnosis not present

## 2019-09-17 DIAGNOSIS — H52223 Regular astigmatism, bilateral: Secondary | ICD-10-CM | POA: Diagnosis not present

## 2019-09-25 ENCOUNTER — Other Ambulatory Visit: Payer: Self-pay | Admitting: Family Medicine

## 2019-09-25 DIAGNOSIS — Z716 Tobacco abuse counseling: Secondary | ICD-10-CM

## 2019-09-25 DIAGNOSIS — Z720 Tobacco use: Secondary | ICD-10-CM

## 2019-09-26 ENCOUNTER — Telehealth: Payer: Self-pay

## 2019-09-26 NOTE — Telephone Encounter (Signed)
Please call pt to schedule appt.  No further refills until pt is seen.  T. Chanci Ojala, CMA  

## 2019-10-08 ENCOUNTER — Ambulatory Visit: Payer: Medicare HMO | Attending: Internal Medicine

## 2019-10-08 DIAGNOSIS — Z23 Encounter for immunization: Secondary | ICD-10-CM

## 2019-10-08 NOTE — Progress Notes (Signed)
   Covid-19 Vaccination Clinic  Name:  Yurie Jeck    MRN: [id number] DOB: 03-22-1951  10/08/2019  Ms. Portee was observed post Covid-19 immunization for 30 minutes based on pre-vaccination screening without incident. She was provided with Vaccine Information Sheet and instruction to access the V-Safe system.   Ms. Frost was instructed to call 911 with any severe reactions post vaccine: Marland Kitchen Difficulty breathing  . Swelling of face and throat  . A fast heartbeat  . A bad rash all over body  . Dizziness and weakness   Immunizations Administered    Name Date Dose VIS Date Route   Pfizer COVID-19 Vaccine 10/08/2019 11:37 AM 0.3 mL 08/14/2018 Intramuscular   Manufacturer: [place]   Lot: [id number]   [place]: [id number]

## 2019-10-23 ENCOUNTER — Other Ambulatory Visit: Payer: Self-pay

## 2019-10-23 ENCOUNTER — Encounter: Payer: Self-pay | Admitting: Physician Assistant

## 2019-10-23 ENCOUNTER — Ambulatory Visit (INDEPENDENT_AMBULATORY_CARE_PROVIDER_SITE_OTHER): Payer: Medicare HMO | Admitting: Physician Assistant

## 2019-10-23 VITALS — BP 137/74 | HR 75 | Temp 98.3°F | Ht 62.0 in | Wt 184.8 lb

## 2019-10-23 DIAGNOSIS — Z720 Tobacco use: Secondary | ICD-10-CM | POA: Diagnosis not present

## 2019-10-23 DIAGNOSIS — I10 Essential (primary) hypertension: Secondary | ICD-10-CM

## 2019-10-23 DIAGNOSIS — F39 Unspecified mood [affective] disorder: Secondary | ICD-10-CM

## 2019-10-23 DIAGNOSIS — G478 Other sleep disorders: Secondary | ICD-10-CM | POA: Diagnosis not present

## 2019-10-23 DIAGNOSIS — F439 Reaction to severe stress, unspecified: Secondary | ICD-10-CM

## 2019-10-23 DIAGNOSIS — Z716 Tobacco abuse counseling: Secondary | ICD-10-CM | POA: Diagnosis not present

## 2019-10-23 DIAGNOSIS — R413 Other amnesia: Secondary | ICD-10-CM | POA: Diagnosis not present

## 2019-10-23 DIAGNOSIS — Z9181 History of falling: Secondary | ICD-10-CM | POA: Diagnosis not present

## 2019-10-23 MED ORDER — LOSARTAN POTASSIUM 100 MG PO TABS: ORAL_TABLET | ORAL | 0 refills | Status: DC

## 2019-10-23 MED ORDER — LORAZEPAM 0.5 MG PO TABS: 0.5000 mg | ORAL_TABLET | Freq: Two times a day (BID) | ORAL | 1 refills | Status: DC | PRN

## 2019-10-23 NOTE — Patient Instructions (Addendum)
Call pulmonologist to scheduled follow--up appointment Call to schedule mammogram - [facility] [phone]  I want you to do [employer] Hook-up and square breathing 4-5 times per day.  10 square breathing each time.    -Also we need to transition your brain into thinking more positively.  These tasks below are some things I want you to do every day 1)  write 3 new things that you are grateful for every day for 21 days  2)  exercise daily- walk for 15 minutes twice a day every day 3)  you are going to journal every day about one positive experience that you had 4)  meditate every day.  You can go on YouTube and look for 15-minute relaxation meditation or what ever.  But we need to make sure that you are in the moment and relaxing and deep breathing every day 5)  Write 1 positive email every day to praise someone in your life     - If you have insomnia or difficulty sleeping, this information is for you:  - Avoid caffeinated beverages after lunch,  no alcoholic beverages,  no eating within 2-3 hours of lying down,  avoid exposure to blue light before bed,  avoid daytime naps, and  needs to maintain a regular sleep schedule- go to sleep and wake up around the same time every night.   - Resolve concerns or worries before entering bedroom:  Discussed relaxation techniques with patient and to keep a journal to write down fears\ worries.  I suggested seeing a counselor for CBT.   - Recommend patient meditate or do deep breathing exercises to help relax.   Incorporate the use of white noise machines or listen to "sleep meditation music", or recordings of guided meditations for sleep from YouTube which are free, such as  "guided meditation for detachment from over thinking"  by Mayford Knife.     What causes depression?  You may already know that depression (MDD) is a complex medical condition. The exact cause is unknown, but it's most likely a combination of several factors: genetic,  biological, environmental, and psychological.  Depression can run in families, but not everyone with depression has a family history. Scientists are trying to identify genes involved to understand the role that genetics may play in depression.  Environmental factors that may play a role in depression include trauma, loss of a loved one, a difficult relationship, or other stressful situations. Some depressive episodes may occur without an obvious trigger.  If you think you may have depression, there is something you can do about it. The first step is to talk to your healthcare professional about your symptoms.  You can also read about the symptoms of depression below to help you get a better understanding about the types of changes you may be experiencing.    Depression and the brain:  It's thought that certain neurotransmitters such as serotonin, norepinephrine, and dopamine (chemicals that the brain uses to communicate) are out of balance when you're depressed  Serotonin is one of the chemicals believed to be affected by depression - it is thought to be involved in the regulation of mood and other functions    What are the symptoms of depression?  Emotional. Physical. Cognitive.  1. Little interest or pleasure in doing things 2. Feeling down, depressed, or hopeless 3. Trouble falling or staying asleep, or sleeping too much 4. Feeling tired or having little energy 5. Poor appetite, overeating, or considerable weight changes 6. Feeling bad  about yourself - that you are a failure or having a lot of guilt 7. Difficulty concentrating on things or making decisions 8. Moving or speaking slowly, so that other people have noticed, or being so restless that you've been moving around a lot 9. Thoughts that you would be better off dead, or of hurting yourself in some way   Depression is a complex medical condition that may make it hard to feel like yourself. Talk to your healthcare  professional about all of your symptoms and their impact.   You may be suffering from depression if you are experiencing five or more of the symptoms above including either depressed mood or decreased interest or pleasure. In addition, depression is also associated with:   Symptoms that are new or noticeably worse compared to what they were prior to the episode  Symptoms that persist for most of the day, nearly every day for at least two consecutive weeks  Episodes that are accompanied by clinically significant distress or impaired functioning    Myth:  Depression is something you can just "snap out of".   Fact:  No one chooses to be depressed, and it is not caused by laziness, weakness, or simply feeling sad. Depression is a complex medical condition. The exact cause is unknown, but it's most likely a combination of several factors: genetic, biological, environmental, and psychological. You cannot control your loved one's recovery, but you can support them along the way.   Myth:  Depression only affects your emotions  Fact:  Depression can be emotional, physical, and cognitive symptoms. It can result in symptoms that include little interest or pleasure in doing things; feeling down, depressed, or hopeless; trouble falling or staying asleep, or sleeping too much; feeling tired or having little energy; poor appetite, overeating, or considerable weight changes; feeling bad about yourself, like you are a failure or having a lot of guilt; trouble concentrating on things or making decisions; moving or speaking slowly, so that other people have noticed, or being so restless that you've been moving around a lot; and thoughts that you would be better off dead, or of hurting yourself in some way. People with depression do not all experience the same symptoms, and the severity, frequency, and duration can vary

## 2019-10-23 NOTE — Progress Notes (Signed)
Established Patient Office Visit  Subjective:  Patient ID: Tiffany Murphy, female    DOB: 1950-10-13  Age: 69 y.o. MRN: [id number]  CC:  Chief Complaint  Patient presents with  . Nicotine Dependence    HPI Tiffany Murphy presents for follow-up on smoking cessation,  medication refills, and mood management. Pt reports Wellbutrin and Chantix have helped with reducing her smoking. She is down to 4 cigarettes/day. Reports sometimes she does miss her Chantix dose and can tell a difference in her cravings. Currently she is experiencing increased personal stress which has affected her ability to completely quit smoking. States there's conflict between her husband and 9 year-old granddaughter and she is in the middle of it. States she can't handle it. Denies SI/HI. Reports she was on chronic benzodiapzepines in the past to help control her anxiety. States Sertraline has helped to manage her mood well in the past. She does have a good friend who is her support system when she needs her.   Pt also reports recurrent falls from bed when she's sleeping, and some have resulted in injury. She does sleep talk too. Pt reports memory concerns. States she has noticed forgetting to cook certain meals that she cooked in the past with no issues.    Past Medical History:  Diagnosis Date  . Asthma   . Closed fracture of left distal radius 10/25/2018  . COPD (chronic obstructive pulmonary disease) ([place])   . GERD (gastroesophageal reflux disease)   . Hypercholesteremia   . Hypertension   . Sleep apnea   . Tobacco abuse     Past Surgical History:  Procedure Laterality Date  . APPENDECTOMY    . COLONOSCOPY  08/26/2015   Colonic polyps status post polypectomy. Minimal sigmoid diverticulosis.   Marland Kitchen ESOPHAGOGASTRODUODENOSCOPY  02/25/2011   Large hiatal hernia otherwise normal EGD.  Marland Kitchen OPEN REDUCTION INTERNAL FIXATION (ORIF) DISTAL RADIAL FRACTURE Left 10/25/2018   Procedure: OPEN REDUCTION INTERNAL FIXATION (ORIF)LEFT   DISTAL RADIAL FRACTURE;  Surgeon: Marchia Bond, MD;  Location: [place];  Service: Orthopedics;  Laterality: Left;  Marland Kitchen VAGINAL HYSTERECTOMY      Family History  Problem Relation Age of Onset  . Alzheimer's disease Father 56  . Diabetes Father   . CAD Mother 74  . Colon cancer Neg Hx   . Esophageal cancer Neg Hx     Social History   Socioeconomic History  . Marital status: Married    Spouse name: Not on file  . Number of children: 2  . Years of education: Not on file  . Highest education level: Not on file  Occupational History  . Not on file  Tobacco Use  . Smoking status: Current Every Day Smoker    Packs/day: 1.00    Years: 44.00    Pack years: 44.00    Types: Cigarettes    Last attempt to quit: 03/24/2019    Years since quitting: 0.5  . Smokeless tobacco: Never Used  . Tobacco comment: in process of quitting  Substance and Sexual Activity  . Alcohol use: No    Alcohol/week: 0.0 standard drinks  . Drug use: No  . Sexual activity: Not Currently  Other Topics Concern  . Not on file  Social History Narrative   Works at [employer].    Social Determinants of Health   Financial Resource Strain:   . Difficulty of Paying Living Expenses:   Food Insecurity:   . Worried About Charity fundraiser in the  Last Year:   . [place] in the Last Year:   Transportation Needs:   . Film/video editor (Medical):   Marland Kitchen Lack of Transportation (Non-Medical):   Physical Activity:   . Days of Exercise per Week:   . Minutes of Exercise per Session:   Stress:   . Feeling of Stress :   Social Connections:   . Frequency of Communication with Friends and Family:   . Frequency of Social Gatherings with Friends and Family:   . Attends Religious Services:   . Active Member of Clubs or Organizations:   . Attends Archivist Meetings:   Marland Kitchen Marital Status:   Intimate Partner Violence:   . Fear of Current or Ex-Partner:   . Emotionally  Abused:   Marland Kitchen Physically Abused:   . Sexually Abused:     Outpatient Medications Prior to Visit  Medication Sig Dispense Refill  . acyclovir (ZOVIRAX) 200 MG capsule Take 1 capsule by mouth daily.    Marland Kitchen albuterol (PROVENTIL HFA;VENTOLIN HFA) 108 (90 Base) MCG/ACT inhaler Inhale 2 puffs into the lungs every 6 (six) hours as needed for wheezing. 1 Inhaler 2  . buPROPion (WELLBUTRIN SR) 150 MG 12 hr tablet TAKE 1 TABLET (150 MG TOTAL) BY MOUTH 2 (TWO) TIMES DAILY. 180 tablet 0  . CHANTIX 1 MG tablet Take 1 tablet by mouth twice daily 60 tablet 0  . dicyclomine (BENTYL) 20 MG tablet Take 1 tablet (20 mg total) by mouth 2 (two) times daily. 180 tablet 3  . fluticasone (FLONASE) 50 MCG/ACT nasal spray USE 2 SPRAYS IN EACH NOSTRIL EVERY DAY 32 g 0  . Fluticasone-Umeclidin-Vilant (TRELEGY ELLIPTA) 100-62.5-25 MCG/INH AEPB Inhale 1 puff into the lungs daily. 90 each 3  . gabapentin (NEURONTIN) 300 MG capsule TAKE 1 CAPSULE IN THE MORNING, 1 CAPSULE IN THE  AFTERNOON, AND 2 CAPSULES AT BEDTIME 360 capsule 0  . montelukast (SINGULAIR) 10 MG tablet TAKE 1 TABLET AT BEDTIME 90 tablet 0  . omeprazole (PRILOSEC) 40 MG capsule Take 1 capsule (40 mg total) by mouth daily. 90 capsule 0  . potassium chloride SA (KLOR-CON) 20 MEQ tablet Take 1 tablet (20 mEq total) by mouth 2 (two) times daily. 180 tablet 0  . rosuvastatin (CRESTOR) 40 MG tablet Take 0.5 tablets (20 mg total) by mouth at bedtime. 45 tablet 1  . losartan (COZAAR) 100 MG tablet TAKE 1 TABLET BY MOUTH ONCE DAILY . APPOINTMENT REQUIRED FOR FUTURE REFILLS 90 tablet 0  . sertraline (ZOLOFT) 100 MG tablet Take 2 tablets by mouth once daily 60 tablet 0   No facility-administered medications prior to visit.    Allergies  Allergen Reactions  . Penicillins Hives and Shortness Of Breath  . Lovastatin     Cramps in legs and feet  . Other     Fragrances in soaps, perfumes, shampoos, conditioners    ROS  Review of Systems:  A fourteen system review  of systems was performed and found to be positive as per HPI.  Objective:    Physical Exam General:  Well Developed, well nourished, appropriate for stated age.  Neuro:  Alert and oriented,  extra-ocular muscles intact  HEENT:  Normocephalic, atraumatic Skin:  no gross rash, warm, pink. Cardiac:  RRR, S1 S2 Respiratory:  ECTA B/L and A/P, Not using accessory muscles, speaking in full sentences- unlabored. Vascular:  Ext warm, no cyanosis apprec.; cap RF less 2 sec. Psych:  No HI/SI, judgement and insight stable, mood- very  anxious   BP 137/74   Pulse 75   Temp 98.3 F (36.8 C) (Oral)   Ht 5\' 2"  (1.575 m)   Wt 184 lb 12.8 oz (83.8 kg)   SpO2 95%   BMI 33.80 kg/m  Wt Readings from Last 3 Encounters:  10/23/19 184 lb 12.8 oz (83.8 kg)  08/15/19 182 lb (82.6 kg)  07/19/19 186 lb 4.8 oz (84.5 kg)     Health Maintenance Due  Topic Date Due  . MAMMOGRAM  04/11/2013  . DEXA SCAN  Never done  . PNA vac Low Risk Adult (1 of 2 - PCV13) Never done    There are no preventive care reminders to display for this patient.  Lab Results  Component Value Date   TSH 2.630 07/19/2019   Lab Results  Component Value Date   WBC 5.3 07/19/2019   HGB 13.6 07/19/2019   HCT 42.6 07/19/2019   MCV 93 07/19/2019   PLT 264 07/19/2019   Lab Results  Component Value Date   NA 143 07/19/2019   K 4.4 07/19/2019   CO2 24 07/19/2019   GLUCOSE 128 (H) 07/19/2019   BUN 20 07/19/2019   CREATININE 0.89 07/19/2019   BILITOT 0.3 07/19/2019   ALKPHOS 70 07/19/2019   AST 18 07/19/2019   ALT 13 07/19/2019   PROT 6.3 07/19/2019   ALBUMIN 4.1 07/19/2019   CALCIUM 9.5 07/19/2019   ANIONGAP 7 06/28/2019   Lab Results  Component Value Date   CHOL 169 07/19/2019   Lab Results  Component Value Date   HDL 51 07/19/2019   Lab Results  Component Value Date   LDLCALC 95 07/19/2019   Lab Results  Component Value Date   TRIG 130 07/19/2019   Lab Results  Component Value Date   CHOLHDL 3.3  07/19/2019   Lab Results  Component Value Date   HGBA1C 6.1 (H) 07/19/2019      Assessment & Plan:   Problem List Items Addressed This Visit      Cardiovascular and Mediastinum   Benign essential HTN (Chronic)   Relevant Medications   losartan (COZAAR) 100 MG tablet     Other   Mood disorder (HCC) (Chronic)   Relevant Medications   LORazepam (ATIVAN) 0.5 MG tablet   Other Relevant Orders   Ambulatory referral to Psychiatry   Tobacco abuse counseling    Other Visit Diagnoses    Tobacco use    -  Primary   Sleep talking       Relevant Orders   Ambulatory referral to Neurology   Memory loss       Relevant Orders   Ambulatory referral to Neurology   Stress at home       Relevant Orders   Ambulatory referral to Psychiatry      Meds ordered this encounter  Medications  . losartan (COZAAR) 100 MG tablet    Sig: TAKE 1 TABLET BY MOUTH ONCE DAILY .    Dispense:  90 tablet    Refill:  0  . LORazepam (ATIVAN) 0.5 MG tablet    Sig: Take 1 tablet (0.5 mg total) by mouth 2 (two) times daily as needed for anxiety.    Dispense:  30 tablet    Refill:  1    Order Specific Question:   Supervising Provider    Answer:   Beatrice Lecher D [[id number]]   Mood disorder, stress at home: - PHQ9 score of 15 and GAD-7 score of 15 indicating anxiety and  depression not well-controlled. - Pt's pulmonologist started Wellbutrin to help with smoking cessation. Discussed with patient she's on highest dose of Sertraline 200 mg/day and Wellbutrin 150 mg BID so most appropriate step would be referral to psychiatry for further management. Current medication therapy needs to be closely monitored for serotonin syndrome. Pt is agreeable. - Discussed with patient risk and benefits of benzodiazepines including risk for dependence and tolerance. Pt made verbal contract for 2 refills of 30 tablets per year per office policy and will seek immediate medical care if she has any SI/HI.  - Discussed with patient  the importance of non-pharmacologic therapy such as exercise- walking, good sleep hygiene, or meditation for relaxation and to reduce stress.  Tobacco use,Tobacco Abuse Counseling: - Pt has reduced smoking and encourage to set a quit date. - Continue Chantix and Wellbutrin and will re-assess progress at next OV. - Will continue to monitor.  HTN: - BP today is 137/74, stable. - Pt requested refill. - Continue Losartan 100 mg once daily. - Will continue to monitor.   H/o of falls, sleep talk, memory concerns: - Will place referral for neurology consult for further evaluation of possible sleep disorders and patient's concern of memory loss.    Follow-up: Return for Mood, HTN, Smoking cessation in 3 months.    Lorrene Reid, PA-C

## 2019-10-25 ENCOUNTER — Other Ambulatory Visit: Payer: Self-pay | Admitting: Family Medicine

## 2019-10-30 ENCOUNTER — Telehealth: Payer: Self-pay | Admitting: Physician Assistant

## 2019-10-30 NOTE — Telephone Encounter (Signed)
-----   Message from Lorrene Reid, [place] sent at 10/29/2019  6:01 PM EDT ----- Jefm Bryant,  Please call patient and let her know neurology recommended for patient to discuss concerns of frequent falls during sleep and sleep talk with her pulmonologist at her upcoming appointment next month.   Thank you, Herb Grays

## 2019-10-30 NOTE — Telephone Encounter (Signed)
Attempted to contact patient with no answer. Unable to leave VM due to VM box full. AS, CMA

## 2019-10-31 NOTE — Telephone Encounter (Signed)
Patient is aware of the below and verbalized understanding. AS, CMA 

## 2019-11-01 ENCOUNTER — Ambulatory Visit: Payer: Medicare HMO

## 2019-11-13 ENCOUNTER — Other Ambulatory Visit: Payer: Self-pay | Admitting: Family Medicine

## 2019-11-13 DIAGNOSIS — K219 Gastro-esophageal reflux disease without esophagitis: Secondary | ICD-10-CM

## 2019-11-13 DIAGNOSIS — J45909 Unspecified asthma, uncomplicated: Secondary | ICD-10-CM

## 2019-11-13 DIAGNOSIS — J3089 Other allergic rhinitis: Secondary | ICD-10-CM

## 2019-11-14 ENCOUNTER — Other Ambulatory Visit: Payer: Self-pay

## 2019-11-14 DIAGNOSIS — I10 Essential (primary) hypertension: Secondary | ICD-10-CM

## 2019-11-14 DIAGNOSIS — Z716 Tobacco abuse counseling: Secondary | ICD-10-CM

## 2019-11-14 DIAGNOSIS — Z720 Tobacco use: Secondary | ICD-10-CM

## 2019-11-14 DIAGNOSIS — E785 Hyperlipidemia, unspecified: Secondary | ICD-10-CM

## 2019-11-14 MED ORDER — LOSARTAN POTASSIUM 100 MG PO TABS: ORAL_TABLET | ORAL | 0 refills | Status: DC

## 2019-11-14 MED ORDER — ROSUVASTATIN CALCIUM 40 MG PO TABS: 20.0000 mg | ORAL_TABLET | Freq: Every day | ORAL | 0 refills | Status: DC

## 2019-11-14 MED ORDER — VARENICLINE TARTRATE 1 MG PO TABS: 1.0000 mg | ORAL_TABLET | Freq: Two times a day (BID) | ORAL | 0 refills | Status: DC

## 2019-11-18 ENCOUNTER — Other Ambulatory Visit: Payer: Self-pay | Admitting: Family Medicine

## 2019-11-18 DIAGNOSIS — K219 Gastro-esophageal reflux disease without esophagitis: Secondary | ICD-10-CM

## 2019-11-19 ENCOUNTER — Other Ambulatory Visit: Payer: Self-pay | Admitting: Physician Assistant

## 2019-11-19 MED ORDER — SERTRALINE HCL 100 MG PO TABS: 200.0000 mg | ORAL_TABLET | Freq: Every day | ORAL | 0 refills | Status: DC

## 2019-11-27 ENCOUNTER — Encounter: Payer: Self-pay | Admitting: Pulmonary Disease

## 2019-11-27 ENCOUNTER — Other Ambulatory Visit: Payer: Self-pay

## 2019-11-27 ENCOUNTER — Ambulatory Visit (INDEPENDENT_AMBULATORY_CARE_PROVIDER_SITE_OTHER): Payer: Medicare HMO | Admitting: Pulmonary Disease

## 2019-11-27 VITALS — BP 120/72 | HR 67 | Temp 98.3°F | Ht 62.0 in | Wt 183.2 lb

## 2019-11-27 DIAGNOSIS — G4733 Obstructive sleep apnea (adult) (pediatric): Secondary | ICD-10-CM | POA: Diagnosis not present

## 2019-11-27 DIAGNOSIS — J449 Chronic obstructive pulmonary disease, unspecified: Secondary | ICD-10-CM | POA: Diagnosis not present

## 2019-11-27 MED ORDER — TRELEGY ELLIPTA 100-62.5-25 MCG/INH IN AEPB
1.0000 | INHALATION_SPRAY | Freq: Every day | RESPIRATORY_TRACT | 0 refills | Status: DC
Start: 2019-11-27 — End: 2019-12-13

## 2019-11-27 MED ORDER — TRELEGY ELLIPTA 100-62.5-25 MCG/INH IN AEPB: 1.0000 | INHALATION_SPRAY | Freq: Every day | RESPIRATORY_TRACT | 3 refills | Status: DC

## 2019-11-27 NOTE — Patient Instructions (Signed)
Continue Trelegy  Continue albuterol use as needed  I will see you back in the office in about 3 months  Call with any significant concerns

## 2019-11-27 NOTE — Progress Notes (Signed)
Tiffany Murphy    [id number]    January 23, 1951  Primary Care Physician:Abonza, Samantha Crimes  Referring Physician: Lorrene Reid, PA-C [place] [place],  [place] [id number]  Chief complaint:   Patient with a history of obstructive lung disease In for evaluation for COPD  HPI:  She has been doing relatively well Down to just about 1 cigarette a day  Sleep study did reveal mild obstructive sleep apnea -Not on any treatment for mild COPD at present, denies significant daytime symptoms  Has a history of COPD History of obstructive sleep apnea Did use CPAP regularly in the past-has not in the last year-she did try to use it again recently and felt the pressure was not right--she has not been able to tolerate it  She was smoking up to 2 packs a day in the past  She has a history of hypertension, mood disorder, panic attacks Hyperlipidemia Lung nodule noted in the past  History of chronic fatigue  She was on Symbicort-feels it may not be helping as well as it did in the past  Outpatient Encounter Medications as of 11/27/2019  Medication Sig  . acyclovir (ZOVIRAX) 200 MG capsule Take 1 capsule by mouth daily.  Marland Kitchen albuterol (PROVENTIL HFA;VENTOLIN HFA) 108 (90 Base) MCG/ACT inhaler Inhale 2 puffs into the lungs every 6 (six) hours as needed for wheezing.  Marland Kitchen buPROPion (WELLBUTRIN SR) 150 MG 12 hr tablet TAKE 1 TABLET (150 MG TOTAL) BY MOUTH 2 (TWO) TIMES DAILY.  Marland Kitchen dicyclomine (BENTYL) 20 MG tablet Take 1 tablet (20 mg total) by mouth 2 (two) times daily.  . fluticasone (FLONASE) 50 MCG/ACT nasal spray USE 2 SPRAYS IN EACH NOSTRIL EVERY DAY  . Fluticasone-Umeclidin-Vilant (TRELEGY ELLIPTA) 100-62.5-25 MCG/INH AEPB Inhale 1 puff into the lungs daily.  Marland Kitchen gabapentin (NEURONTIN) 300 MG capsule TAKE 1 CAPSULE IN THE MORNING, 1 CAPSULE IN THE  AFTERNOON, AND 2 CAPSULES AT BEDTIME  . LORazepam (ATIVAN) 0.5 MG tablet Take 1 tablet (0.5 mg total) by mouth 2 (two) times  daily as needed for anxiety.  Marland Kitchen losartan (COZAAR) 100 MG tablet TAKE 1 TABLET BY MOUTH ONCE DAILY .  Marland Kitchen montelukast (SINGULAIR) 10 MG tablet TAKE 1 TABLET AT BEDTIME  . omeprazole (PRILOSEC) 40 MG capsule Take 1 capsule (40 mg total) by mouth daily.  . potassium chloride SA (KLOR-CON) 20 MEQ tablet Take 1 tablet (20 mEq total) by mouth 2 (two) times daily.  . rosuvastatin (CRESTOR) 40 MG tablet Take 0.5 tablets (20 mg total) by mouth at bedtime.  . sertraline (ZOLOFT) 100 MG tablet Take 2 tablets (200 mg total) by mouth daily.  . varenicline (CHANTIX) 1 MG tablet Take 1 tablet (1 mg total) by mouth 2 (two) times daily.   No facility-administered encounter medications on file as of 11/27/2019.    Allergies as of 11/27/2019 - Review Complete 11/27/2019  Allergen Reaction Noted  . Penicillins Hives and Shortness Of Breath 08/05/2012  . Lovastatin  07/11/2013  . Other  07/11/2013    Past Medical History:  Diagnosis Date  . Asthma   . Closed fracture of left distal radius 10/25/2018  . COPD (chronic obstructive pulmonary disease) ([place])   . GERD (gastroesophageal reflux disease)   . Hypercholesteremia   . Hypertension   . Sleep apnea   . Tobacco abuse     Past Surgical History:  Procedure Laterality Date  . APPENDECTOMY    . COLONOSCOPY  08/26/2015  Colonic polyps status post polypectomy. Minimal sigmoid diverticulosis.   Marland Kitchen ESOPHAGOGASTRODUODENOSCOPY  02/25/2011   Large hiatal hernia otherwise normal EGD.  Marland Kitchen OPEN REDUCTION INTERNAL FIXATION (ORIF) DISTAL RADIAL FRACTURE Left 10/25/2018   Procedure: OPEN REDUCTION INTERNAL FIXATION (ORIF)LEFT  DISTAL RADIAL FRACTURE;  Surgeon: Marchia Bond, MD;  Location: [facility];  Service: Orthopedics;  Laterality: Left;  Marland Kitchen VAGINAL HYSTERECTOMY      Family History  Problem Relation Age of Onset  . Alzheimer's disease Father 21  . Diabetes Father   . CAD Mother 51  . Colon cancer Neg Hx   . Esophageal cancer Neg Hx      Social History   Socioeconomic History  . Marital status: Married    Spouse name: Not on file  . Number of children: 2  . Years of education: Not on file  . Highest education level: Not on file  Occupational History  . Not on file  Tobacco Use  . Smoking status: Current Every Day Smoker    Packs/day: 1.00    Years: 44.00    Pack years: 44.00    Types: Cigarettes    Last attempt to quit: 03/24/2019    Years since quitting: 0.6  . Smokeless tobacco: Never Used  . Tobacco comment: in process of quitting  Substance and Sexual Activity  . Alcohol use: No    Alcohol/week: 0.0 standard drinks  . Drug use: No  . Sexual activity: Not Currently  Other Topics Concern  . Not on file  Social History Narrative   Works at [employer].    Social Determinants of Health   Financial Resource Strain:   . Difficulty of Paying Living Expenses:   Food Insecurity:   . Worried About Charity fundraiser in the Last Year:   . Arboriculturist in the Last Year:   Transportation Needs:   . Film/video editor (Medical):   Marland Kitchen Lack of Transportation (Non-Medical):   Physical Activity:   . Days of Exercise per Week:   . Minutes of Exercise per Session:   Stress:   . Feeling of Stress :   Social Connections:   . Frequency of Communication with Friends and Family:   . Frequency of Social Gatherings with Friends and Family:   . Attends Religious Services:   . Active Member of Clubs or Organizations:   . Attends Archivist Meetings:   Marland Kitchen Marital Status:   Intimate Partner Violence:   . Fear of Current or Ex-Partner:   . Emotionally Abused:   Marland Kitchen Physically Abused:   . Sexually Abused:     Review of Systems  Constitutional: Positive for fatigue.  HENT: Negative.   Eyes: Negative.   Respiratory: Positive for apnea and shortness of breath.   Cardiovascular: Negative.   Gastrointestinal: Negative.   Endocrine: Negative.   Genitourinary: Negative.    Psychiatric/Behavioral: Positive for sleep disturbance.  All other systems reviewed and are negative.   Vitals:   11/27/19 1145  BP: 120/72  Pulse: 67  Temp: 98.3 F (36.8 C)  SpO2: 96%   Physical Exam  Constitutional: She appears well-developed and well-nourished.  HENT:  Head: Normocephalic and atraumatic.  Mallampati 3  Eyes: Pupils are equal, round, and reactive to light. Conjunctivae are normal. Right eye exhibits no discharge. Left eye exhibits no discharge.  Neck: No tracheal deviation present. No thyromegaly present.  Cardiovascular: Normal rate and regular rhythm.  Pulmonary/Chest: Effort normal. No respiratory distress. She  has no wheezes. She has no rales.  Musculoskeletal:     Cervical back: Normal range of motion and neck supple.   Data Reviewed: Report of a previous CT scan noted in record Most recent CT reviewed showing stable nodules-reviewed by myself  Sleep study results not available  Assessment:  .  Obstructive lung disease -Very significant smoking history -We will continue bronchodilator treatments -Continue Trelegy  .  History of abnormal CT scan of the chest -Repeat CT scan has been stable -Granulomatous disease  .  History of obstructive sleep apnea -Use CPAP in the past but did not tolerate it well -Symptom monitoring  .  Shortness of breath -Related to history of COPD -Continues to work on quitting smoking  Plan/Recommendations: Continue Trelegy  Continue use of inhalers  I will see her back in the office in about 3 months  Is to call with any other significant concerns  Sherrilyn Rist MD [facility] 11/27/2019, 11:53 AM  CC: Lorrene Reid, PA-C

## 2019-11-29 ENCOUNTER — Other Ambulatory Visit: Payer: Self-pay

## 2019-11-29 DIAGNOSIS — K219 Gastro-esophageal reflux disease without esophagitis: Secondary | ICD-10-CM

## 2019-11-29 MED ORDER — OMEPRAZOLE 40 MG PO CPDR: 40.0000 mg | DELAYED_RELEASE_CAPSULE | Freq: Every day | ORAL | 3 refills | Status: DC

## 2019-12-05 ENCOUNTER — Emergency Department (HOSPITAL_COMMUNITY): Payer: Medicare HMO

## 2019-12-05 ENCOUNTER — Emergency Department (HOSPITAL_COMMUNITY)
Admission: EM | Admit: 2019-12-05 | Discharge: 2019-12-05 | Disposition: A | Discharge status: Home / Self Care | Payer: Medicare HMO | Attending: Emergency Medicine | Admitting: Emergency Medicine

## 2019-12-05 DIAGNOSIS — S0990XA Unspecified injury of head, initial encounter: Secondary | ICD-10-CM

## 2019-12-05 DIAGNOSIS — Z79899 Other long term (current) drug therapy: Secondary | ICD-10-CM | POA: Insufficient documentation

## 2019-12-05 DIAGNOSIS — S8991XA Unspecified injury of right lower leg, initial encounter: Secondary | ICD-10-CM | POA: Diagnosis not present

## 2019-12-05 DIAGNOSIS — Y999 Unspecified external cause status: Secondary | ICD-10-CM | POA: Diagnosis not present

## 2019-12-05 DIAGNOSIS — Y9389 Activity, other specified: Secondary | ICD-10-CM | POA: Insufficient documentation

## 2019-12-05 DIAGNOSIS — F17210 Nicotine dependence, cigarettes, uncomplicated: Secondary | ICD-10-CM | POA: Insufficient documentation

## 2019-12-05 DIAGNOSIS — S0083XA Contusion of other part of head, initial encounter: Secondary | ICD-10-CM | POA: Insufficient documentation

## 2019-12-05 DIAGNOSIS — R531 Weakness: Secondary | ICD-10-CM | POA: Diagnosis not present

## 2019-12-05 DIAGNOSIS — M25561 Pain in right knee: Secondary | ICD-10-CM | POA: Insufficient documentation

## 2019-12-05 DIAGNOSIS — W19XXXA Unspecified fall, initial encounter: Secondary | ICD-10-CM

## 2019-12-05 DIAGNOSIS — S199XXA Unspecified injury of neck, initial encounter: Secondary | ICD-10-CM | POA: Diagnosis not present

## 2019-12-05 DIAGNOSIS — M79645 Pain in left finger(s): Secondary | ICD-10-CM | POA: Diagnosis not present

## 2019-12-05 DIAGNOSIS — J449 Chronic obstructive pulmonary disease, unspecified: Secondary | ICD-10-CM | POA: Diagnosis not present

## 2019-12-05 DIAGNOSIS — S6992XA Unspecified injury of left wrist, hand and finger(s), initial encounter: Secondary | ICD-10-CM | POA: Diagnosis not present

## 2019-12-05 DIAGNOSIS — W1839XA Other fall on same level, initial encounter: Secondary | ICD-10-CM | POA: Diagnosis not present

## 2019-12-05 DIAGNOSIS — N3000 Acute cystitis without hematuria: Secondary | ICD-10-CM | POA: Diagnosis not present

## 2019-12-05 DIAGNOSIS — R52 Pain, unspecified: Secondary | ICD-10-CM | POA: Diagnosis not present

## 2019-12-05 DIAGNOSIS — R296 Repeated falls: Secondary | ICD-10-CM | POA: Diagnosis not present

## 2019-12-05 DIAGNOSIS — M25562 Pain in left knee: Secondary | ICD-10-CM | POA: Diagnosis not present

## 2019-12-05 DIAGNOSIS — R251 Tremor, unspecified: Secondary | ICD-10-CM | POA: Diagnosis not present

## 2019-12-05 DIAGNOSIS — R609 Edema, unspecified: Secondary | ICD-10-CM | POA: Diagnosis not present

## 2019-12-05 DIAGNOSIS — R42 Dizziness and giddiness: Secondary | ICD-10-CM | POA: Diagnosis not present

## 2019-12-05 DIAGNOSIS — I10 Essential (primary) hypertension: Secondary | ICD-10-CM | POA: Insufficient documentation

## 2019-12-05 DIAGNOSIS — Y929 Unspecified place or not applicable: Secondary | ICD-10-CM | POA: Diagnosis not present

## 2019-12-05 DIAGNOSIS — M79642 Pain in left hand: Secondary | ICD-10-CM | POA: Diagnosis not present

## 2019-12-05 DIAGNOSIS — S52502A Unspecified fracture of the lower end of left radius, initial encounter for closed fracture: Secondary | ICD-10-CM | POA: Diagnosis not present

## 2019-12-05 DIAGNOSIS — R0902 Hypoxemia: Secondary | ICD-10-CM | POA: Diagnosis not present

## 2019-12-05 DIAGNOSIS — S8992XA Unspecified injury of left lower leg, initial encounter: Secondary | ICD-10-CM | POA: Diagnosis not present

## 2019-12-05 LAB — CBC WITH DIFFERENTIAL/PLATELET
Abs Immature Granulocytes: 0.03 10*3/uL (ref 0.00–0.07)
Basophils Absolute: 0.1 10*3/uL (ref 0.0–0.1)
Basophils Relative: 1 %
Eosinophils Absolute: 0.3 10*3/uL (ref 0.0–0.5)
Eosinophils Relative: 6 %
HCT: 41.9 % (ref 36.0–46.0)
Hemoglobin: 13.3 g/dL (ref 12.0–15.0)
Immature Granulocytes: 1 %
Lymphocytes Relative: 22 %
Lymphs Abs: 1.3 10*3/uL (ref 0.7–4.0)
MCH: 29.8 pg (ref 26.0–34.0)
MCHC: 31.7 g/dL (ref 30.0–36.0)
MCV: 93.9 fL (ref 80.0–100.0)
Monocytes Absolute: 0.4 10*3/uL (ref 0.1–1.0)
Monocytes Relative: 7 %
Neutro Abs: 3.6 10*3/uL (ref 1.7–7.7)
Neutrophils Relative %: 63 %
Platelets: 209 10*3/uL (ref 150–400)
RBC: 4.46 MIL/uL (ref 3.87–5.11)
RDW: 14.1 % (ref 11.5–15.5)
WBC: 5.7 10*3/uL (ref 4.0–10.5)
nRBC: 0 % (ref 0.0–0.2)

## 2019-12-05 LAB — BASIC METABOLIC PANEL
Anion gap: 9 (ref 5–15)
BUN: 12 mg/dL (ref 8–23)
CO2: 24 mmol/L (ref 22–32)
Calcium: 9.1 mg/dL (ref 8.9–10.3)
Chloride: 109 mmol/L (ref 98–111)
Creatinine, Ser: 0.94 mg/dL (ref 0.44–1.00)
GFR calc Af Amer: >60 mL/min (ref >60)
GFR calc non Af Amer: >60 mL/min (ref >60)
Glucose, Bld: 119 mg/dL — ABNORMAL HIGH (ref 70–99)
Potassium: 4.2 mmol/L (ref 3.5–5.1)
Sodium: 142 mmol/L (ref 135–145)

## 2019-12-05 LAB — URINALYSIS, ROUTINE W REFLEX MICROSCOPIC
Bilirubin Urine: NEGATIVE
Glucose, UA: NEGATIVE mg/dL
Hgb urine dipstick: NEGATIVE
Ketones, ur: NEGATIVE mg/dL
Nitrite: NEGATIVE
Protein, ur: NEGATIVE mg/dL
Specific Gravity, Urine: 1.016 (ref 1.005–1.030)
pH: 5 (ref 5.0–8.0)

## 2019-12-05 MED ORDER — ACETAMINOPHEN 325 MG PO TABS
650.0000 mg | ORAL_TABLET | Freq: Once | ORAL | Status: AC
  Administered 2019-12-05: 650 mg via ORAL
  Filled 2019-12-05: qty 2

## 2019-12-05 MED ORDER — NITROFURANTOIN MONOHYD MACRO 100 MG PO CAPS
100.0000 mg | ORAL_CAPSULE | Freq: Two times a day (BID) | ORAL | 0 refills | Status: DC
Start: 2019-12-05 — End: 2019-12-05

## 2019-12-05 MED ORDER — FOSFOMYCIN TROMETHAMINE 3 G PO PACK
3.0000 g | PACK | Freq: Once | ORAL | Status: AC
  Administered 2019-12-05: 3 g via ORAL
  Filled 2019-12-05: qty 3

## 2019-12-05 NOTE — ED Notes (Signed)
Pt up to BR without gross difficulty.  CT has been called for results.

## 2019-12-05 NOTE — Discharge Instructions (Addendum)
As discussed, your CT images were unremarkable for any acute abnormalities.  You were treated for urinary tract infection here in the ED.  I have placed a referral to neurology.  Expect a phone call within the next week to schedule an appointment.  Please follow-up with PCP within the next week for further evaluation.  Return to the ER for new or worsening symptoms.

## 2019-12-05 NOTE — ED Notes (Signed)
Pt restful playing games on her phone.  Waiting for CT at this time.  Meds given and denies any other needs at this time.

## 2019-12-05 NOTE — ED Notes (Signed)
Discharge instructions reviewed with pt. Pt verbalized understanding.   

## 2019-12-05 NOTE — ED Provider Notes (Signed)
[facility] EMERGENCY DEPARTMENT Provider Note   CSN: [id number] Arrival date & time: 12/05/19  1208     History No chief complaint on file.   Tiffany Murphy is a 69 y.o. female with a past medical history significant for asthma, COPD, GERD, hyperlipidemia, hypertension, and tobacco abuse who presents to the ED due to numerous falls over the past 2 years.  Patient states her legs feel weak which causes her to fall numerous times. She admits to bilateral lower and upper extremity tremors. She notes she has fall 3 times this week. Today she fell directly on her bilateral hands and feet. She admits to falling yesterday and hitting the anterior aspect of her head causing ecchymosis to her forehead.  Denies visual changes, nausea, vomiting following the fall.  Denies loss of consciousness.  She is not currently on any blood thinners.  Patient denies unilateral weakness, speech changes, and facial droop.  She admits to dizziness which she describes as feeling off balance which she believes causes her to fall.  Denies fever and chills.  Denies preceding chest pain or shortness of breath.  She admits to a frontal headache directly where she fell yesterday.  No treatment prior to arrival.  No aggravating or alleviating factors.  History obtained from patient and past medical records. No interpreter used during encounter.    Past Medical History:  Diagnosis Date  . Asthma   . Closed fracture of left distal radius 10/25/2018  . COPD (chronic obstructive pulmonary disease) ([place])   . GERD (gastroesophageal reflux disease)   . Hypercholesteremia   . Hypertension   . Sleep apnea   . Tobacco abuse     Patient Active Problem List   Diagnosis Date Noted  . Subjective memory complaints 02/21/2019  . Frequency of urination- pt thinks due to HCTZ BP med 02/21/2019  . Hyperlipidemia with target LDL less than 70 11/13/2018  . Closed fracture of left distal radius 10/25/2018  . Tobacco abuse  counseling 07/12/2018  . Sleep difficulties- poor sleep hygeine 07/12/2018  . Chronic pain disorder 07/12/2018  . Chronic joint pain 07/12/2018  . OSA and COPD overlap syndrome ([place]) 04/30/2018  . Chronic fatigue 04/30/2018  . Lung nodule seen on imaging study 04/30/2018  . Vitamin D deficiency 04/30/2018  . Tobacco use disorder 04/11/2018  . Elevated LDL cholesterol level 04/11/2018  . Chronic obstructive pulmonary disease ([place]) 04/11/2018  . GERD (gastroesophageal reflux disease) 04/11/2018  . Asthma 04/11/2018  . Obesity, Class I, BMI 30-34.9 04/11/2018  . Glucose intolerance (impaired glucose tolerance) 04/11/2018  . Irritable bowel syndrome 04/11/2018  . Chronic bilateral upper abdominal pain 04/11/2018  . Panic attacks 04/11/2018  . Mood disorder ([place]) 04/11/2018  . Hypokalemia 04/11/2018  . Environmental and seasonal allergies 04/11/2018  . Elevated coronary artery calcium score 12/01/2014  . Benign essential HTN 07/11/2013  . Depression with anxiety 07/11/2013  . Pure hypercholesterolemia 07/11/2013  . Nicotine addiction 06/15/2013  . Overweight 06/15/2013    Past Surgical History:  Procedure Laterality Date  . APPENDECTOMY    . COLONOSCOPY  08/26/2015   Colonic polyps status post polypectomy. Minimal sigmoid diverticulosis.   Marland Kitchen ESOPHAGOGASTRODUODENOSCOPY  02/25/2011   Large hiatal hernia otherwise normal EGD.  Marland Kitchen OPEN REDUCTION INTERNAL FIXATION (ORIF) DISTAL RADIAL FRACTURE Left 10/25/2018   Procedure: OPEN REDUCTION INTERNAL FIXATION (ORIF)LEFT  DISTAL RADIAL FRACTURE;  Surgeon: Marchia Bond, MD;  Location: [facility];  Service: Orthopedics;  Laterality: Left;  Marland Kitchen VAGINAL HYSTERECTOMY  OB History   No obstetric history on file.     Family History  Problem Relation Age of Onset  . Alzheimer's disease Father 24  . Diabetes Father   . CAD Mother 18  . Colon cancer Neg Hx   . Esophageal cancer Neg Hx     Social History   Tobacco Use    . Smoking status: Current Every Day Smoker    Packs/day: 1.00    Years: 44.00    Pack years: 44.00    Types: Cigarettes    Last attempt to quit: 03/24/2019    Years since quitting: 0.7  . Smokeless tobacco: Never Used  . Tobacco comment: in process of quitting  Vaping Use  . Vaping Use: Former  . Devices: did it for 6 months and stopped   Substance Use Topics  . Alcohol use: No    Alcohol/week: 0.0 standard drinks  . Drug use: No    Home Medications Prior to Admission medications   Medication Sig Start Date End Date Taking? Authorizing Provider  acyclovir (ZOVIRAX) 200 MG capsule Take 1 capsule by mouth daily. 10/26/18  Yes [provider]  albuterol (PROVENTIL HFA;VENTOLIN HFA) 108 (90 Base) MCG/ACT inhaler Inhale 2 puffs into the lungs every 6 (six) hours as needed for wheezing. 04/30/18  Yes Opalski, Neoma Laming, DO  buPROPion (WELLBUTRIN SR) 150 MG 12 hr tablet TAKE 1 TABLET (150 MG TOTAL) BY MOUTH 2 (TWO) TIMES DAILY. 08/19/19  Yes Olalere, Adewale A, MD  Cholecalciferol (VITAMIN D) 125 MCG (5000 UT) CAPS Take 5,000 Units by mouth once a week.   Yes [provider]  Cyanocobalamin (VITAMIN B 12 PO) Take 1 tablet by mouth daily.   Yes [provider]  dicyclomine (BENTYL) 20 MG tablet Take 1 tablet (20 mg total) by mouth 2 (two) times daily. 04/03/19  Yes Jackquline Denmark, MD  fluticasone (FLONASE) 50 MCG/ACT nasal spray USE 2 SPRAYS IN EACH NOSTRIL EVERY DAY Patient taking differently: Place 2 sprays into both nostrils daily.  07/15/19  Yes Opalski, Neoma Laming, DO  Fluticasone-Umeclidin-Vilant (TRELEGY ELLIPTA) 100-62.5-25 MCG/INH AEPB Inhale 1 puff into the lungs daily. 11/27/19  Yes Olalere, Adewale A, MD  gabapentin (NEURONTIN) 300 MG capsule TAKE 1 CAPSULE IN THE MORNING, 1 CAPSULE IN THE  AFTERNOON, AND 2 CAPSULES AT BEDTIME Patient taking differently: Take 300 mg by mouth See admin instructions. Take 1 capsule in the morning, 1 capsule in the afternoon and 2  capsule at bedtime. 08/19/19  Yes Opalski, Neoma Laming, DO  LORazepam (ATIVAN) 0.5 MG tablet Take 1 tablet (0.5 mg total) by mouth 2 (two) times daily as needed for anxiety. 10/23/19  Yes Abonza, Maritza, PA-C  losartan (COZAAR) 100 MG tablet TAKE 1 TABLET BY MOUTH ONCE DAILY . Patient taking differently: Take 100 mg by mouth daily.  11/14/19  Yes Abonza, Herb Grays, PA-C  Multiple Vitamin (MULTIVITAMIN WITH MINERALS) TABS tablet Take 1 tablet by mouth daily.   Yes [provider]  Omega-3 Fatty Acids (FISH OIL) 1000 MG CPDR Take 1,000 mg by mouth daily.   Yes [provider]  omeprazole (PRILOSEC) 40 MG capsule Take 1 capsule (40 mg total) by mouth daily. 11/29/19  Yes Abonza, Maritza, PA-C  rosuvastatin (CRESTOR) 40 MG tablet Take 0.5 tablets (20 mg total) by mouth at bedtime. 11/14/19  Yes Abonza, Maritza, PA-C  sertraline (ZOLOFT) 100 MG tablet Take 2 tablets (200 mg total) by mouth daily. Patient taking differently: Take 100 mg by mouth in the morning and  at bedtime.  11/19/19  Yes Abonza, Herb Grays, PA-C  varenicline (CHANTIX) 1 MG tablet Take 1 tablet (1 mg total) by mouth 2 (two) times daily. 11/14/19  Yes Abonza, Maritza, PA-C  Fluticasone-Umeclidin-Vilant (TRELEGY ELLIPTA) 100-62.5-25 MCG/INH AEPB Inhale 1 puff into the lungs daily. Patient not taking: Reported on 12/05/2019 11/27/19   Laurin Coder, MD  montelukast (SINGULAIR) 10 MG tablet TAKE 1 TABLET AT BEDTIME Patient not taking: Reported on 12/05/2019 07/30/19   Mellody Dance, DO  potassium chloride SA (KLOR-CON) 20 MEQ tablet Take 1 tablet (20 mEq total) by mouth 2 (two) times daily. Patient not taking: Reported on 12/05/2019 05/14/19   Mellody Dance, DO    Allergies    Penicillins, Lisinopril, Lovastatin, and Other  Review of Systems   Review of Systems  Constitutional: Negative for chills and fever.  Respiratory: Negative for shortness of breath.   Cardiovascular: Negative for chest pain.  Gastrointestinal: Negative  for abdominal pain, nausea and vomiting.  Genitourinary: Negative for dysuria.  Neurological: Positive for dizziness, tremors, weakness (generalized) and headaches. Negative for syncope, facial asymmetry, speech difficulty and numbness.  All other systems reviewed and are negative.   Physical Exam Updated Vital Signs BP (!) 144/64 (BP Location: Right Arm)   Pulse 76   Temp 98.1 F (36.7 C) (Oral)   Resp 19   SpO2 98%   Physical Exam Vitals and nursing note reviewed.  Constitutional:      General: She is not in acute distress.    Appearance: She is not ill-appearing.  HENT:     Head: Normocephalic.     Comments: Ecchymosis on forehead.  No raccoon eyes, battle sign, septal hematomas, malocclusion, or hemotympanum. Eyes:     Pupils: Pupils are equal, round, and reactive to light.  Cardiovascular:     Rate and Rhythm: Normal rate and regular rhythm.     Pulses: Normal pulses.     Heart sounds: Normal heart sounds. No murmur heard.  No friction rub. No gallop.   Pulmonary:     Effort: Pulmonary effort is normal.     Breath sounds: Normal breath sounds.  Abdominal:     General: Abdomen is flat. Bowel sounds are normal. There is no distension.     Palpations: Abdomen is soft.     Tenderness: There is no abdominal tenderness. There is no guarding.  Musculoskeletal:     Cervical back: Neck supple.     Comments: Tenderness throughout left fourth finger with surrounding ecchymosis.  Full range of motion of all fingers.  No snuffbox tenderness.  Radial pulse intact.  No tenderness over left wrist with full range of motion.  Right hand and wrist normal with no tenderness, edema, or erythema.  Mild tenderness over anterior aspect of bilateral knees.  Patient able to fully extend and flex bilateral knees.  Distal pulses and sensation intact.  Soft compartments.  No edema, erythema, or warmth of bilateral knees.  Skin:    General: Skin is warm and dry.  Neurological:     General: No focal  deficit present.     Mental Status: She is alert.     Comments: Bilateral upper and lower extremity tremors Speech is clear, able to follow commands CN III-XII intact Normal strength in upper and lower extremities bilaterally including dorsiflexion and plantar flexion, strong and equal grip strength Sensation grossly intact throughout Moves extremities without ataxia, coordination intact No pronator drift Able to ambulate in the ED without difficulty  Psychiatric:  Mood and Affect: Mood normal.        Behavior: Behavior normal.     ED Results / Procedures / Treatments   Labs (all labs ordered are listed, but only abnormal results are displayed) Labs Reviewed  BASIC METABOLIC PANEL - Abnormal; Notable for the following components:      Result Value   Glucose, Bld 119 (*)    All other components within normal limits  URINALYSIS, ROUTINE W REFLEX MICROSCOPIC - Abnormal; Notable for the following components:   APPearance HAZY (*)    Leukocytes,Ua SMALL (*)    Bacteria, UA MANY (*)    All other components within normal limits  URINE CULTURE  CBC WITH DIFFERENTIAL/PLATELET    EKG None  Radiology CT Head Wo Contrast  Result Date: 12/05/2019 CLINICAL DATA:  Weak and dizzy, fall EXAM: CT HEAD WITHOUT CONTRAST CT CERVICAL SPINE WITHOUT CONTRAST TECHNIQUE: Multidetector CT imaging of the head and cervical spine was performed following the standard protocol without intravenous contrast. Multiplanar CT image reconstructions of the cervical spine were also generated. COMPARISON:  CT 01/03/2018, MRI 06/21/2017, chest CT 08/12/2015 FINDINGS: CT HEAD FINDINGS Brain: No acute territorial infarction, hemorrhage, or new intracranial mass. Stable left temporal lobe cyst. This measures approximately 5 mm. Stable ventricle size. Patchy hypodensity within the subcortical white matter consistent with chronic small vessel ischemic change Vascular: No hyperdense vessel.  Carotid vascular  calcification Skull: Normal. Negative for fracture or focal lesion. Sinuses/Orbits: No acute finding. Other: None CT CERVICAL SPINE FINDINGS Alignment: Straightening of the cervical spine. No subluxation. Facet alignment within normal limits Skull base and vertebrae: No acute fracture. No primary bone lesion or focal pathologic process. Mild chronic superior endplate deformity at T1. Soft tissues and spinal canal: No prevertebral fluid or swelling. No visible canal hematoma. Disc levels: Moderate diffuse degenerative changes C4 through C7 with mild disc space narrowing at C3-C4. Multiple anterior osteophytes at these levels. Bilateral foraminal stenosis at these levels. Upper chest: Negative. Other: None IMPRESSION: 1. No CT evidence for acute intracranial abnormality. Mild chronic small vessel ischemic change of the white matter 2. Straightening of the cervical spine with degenerative change. No acute fracture Electronically Signed   By: Donavan Foil M.D.   On: 12/05/2019 19:33   CT Cervical Spine Wo Contrast  Result Date: 12/05/2019 CLINICAL DATA:  Weak and dizzy, fall EXAM: CT HEAD WITHOUT CONTRAST CT CERVICAL SPINE WITHOUT CONTRAST TECHNIQUE: Multidetector CT imaging of the head and cervical spine was performed following the standard protocol without intravenous contrast. Multiplanar CT image reconstructions of the cervical spine were also generated. COMPARISON:  CT 01/03/2018, MRI 06/21/2017, chest CT 08/12/2015 FINDINGS: CT HEAD FINDINGS Brain: No acute territorial infarction, hemorrhage, or new intracranial mass. Stable left temporal lobe cyst. This measures approximately 5 mm. Stable ventricle size. Patchy hypodensity within the subcortical white matter consistent with chronic small vessel ischemic change Vascular: No hyperdense vessel.  Carotid vascular calcification Skull: Normal. Negative for fracture or focal lesion. Sinuses/Orbits: No acute finding. Other: None CT CERVICAL SPINE FINDINGS Alignment:  Straightening of the cervical spine. No subluxation. Facet alignment within normal limits Skull base and vertebrae: No acute fracture. No primary bone lesion or focal pathologic process. Mild chronic superior endplate deformity at T1. Soft tissues and spinal canal: No prevertebral fluid or swelling. No visible canal hematoma. Disc levels: Moderate diffuse degenerative changes C4 through C7 with mild disc space narrowing at C3-C4. Multiple anterior osteophytes at these levels. Bilateral foraminal stenosis at these levels. Upper  chest: Negative. Other: None IMPRESSION: 1. No CT evidence for acute intracranial abnormality. Mild chronic small vessel ischemic change of the white matter 2. Straightening of the cervical spine with degenerative change. No acute fracture Electronically Signed   By: Donavan Foil M.D.   On: 12/05/2019 19:33   DG Knee Complete 4 Views Left  Result Date: 12/05/2019 CLINICAL DATA:  Pain following fall EXAM: LEFT KNEE - COMPLETE 4+ VIEW COMPARISON:  None. FINDINGS: Frontal, lateral, and bilateral oblique views were obtained. No fracture, dislocation, or joint effusion. There is modest narrowing of the patellofemoral joint. Other joint spaces appear normal. No erosive change. IMPRESSION: Modest narrowing patellofemoral joint. Other joint spaces appear normal. No fracture, dislocation, or joint effusion. Electronically Signed   By: Lowella Grip III M.D.   On: 12/05/2019 13:54   DG Knee Complete 4 Views Right  Result Date: 12/05/2019 CLINICAL DATA:  Pain following fall EXAM: RIGHT KNEE - COMPLETE 4+ VIEW COMPARISON:  None. FINDINGS: Frontal, lateral, and bilateral oblique views were obtained. There is no appreciable fracture or dislocation. No joint effusion. There is moderate narrowing of the patellofemoral joint. Other joint spaces appear normal. There is slight spurring along the posterior patella. No erosive change. There are scattered apparent phleboliths. IMPRESSION: Narrowing  patellofemoral joint with mild spurring posteriorly arising from the patella. Other joint spaces appear unremarkable. No evident fracture, dislocation, or joint effusion. Electronically Signed   By: Lowella Grip III M.D.   On: 12/05/2019 13:53   DG Hand Complete Left  Result Date: 12/05/2019 CLINICAL DATA:  Pain following fall EXAM: LEFT HAND - COMPLETE 3+ VIEW COMPARISON:  Oct 21, 2018 FINDINGS: Frontal, oblique, and lateral views obtained. There is postoperative screw and plate fixation in the distal radius. There is evidence of a previous avulsion of the ulnar styloid. There is no appreciable acute fracture or dislocation. There is narrowing of the first IP joint consistent with osteoarthritic change. Other joint spaces appear unremarkable. A small calcification is noted medial to the distal aspect of the fifth proximal phalanx which appears well corticated and may represent either arthropathic etiology or residua of prior trauma. No erosive change. IMPRESSION: Screw and plate fixation in the distal radius region with alignment anatomic. Previous avulsion ulnar styloid. Small calcification medial to the distal aspect of the fifth proximal phalanx may represent residua of old trauma or have arthropathic etiology. This finding does not appear to represent acute fracture. Focal clinical assessment of this area is warranted, however. 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 Other joint spaces appear unremarkable. No erosive change. Electronically Signed   By: Lowella Grip III M.D.   On: 12/05/2019 13:52    Procedures Procedures (including critical care time)  Medications Ordered in ED Medications  fosfomycin (MONUROL) packet 3 g (has no administration in time range)  acetaminophen (TYLENOL) tablet 650 mg (650 mg Oral Given 12/05/19 1612)    ED Course  I have reviewed the triage vital signs and the nursing notes.  Pertinent labs & imaging results that were available during my  care of the patient were reviewed by me and considered in my medical decision making (see chart for details).  Clinical Course as of Dec 05 1951  Thu Dec 05, 2019  1856 Discussed medication options with Corene Cornea with pharmacy who recommends Fosfomycin 3g given her SOB with PCN and age which limits other antibiotics.    [CA]    Clinical Course User Index [CA] Suzy Bouchard, PA-C  MDM Rules/Calculators/A&P                         69 year old female presents to the ED due to numerous falls over the past 2 years. Patient notes she feels tremulous and generalized weakness which causes her to fall. 3 falls in the past week. Positive head injury yesterday. No LOC. She is not currently on any blood thinners.  Upon arrival, vitals all within normal limits.  Patient in no acute distress and non-ill-appearing.  Physical exam reassuring.  Mild ecchymosis on forehead.  No signs of basilar skull fracture on exam.  No cervical, thoracic, or lumbar midline tenderness.  Normal neurological exam with mild bilateral tremor of upper and lower extremities. Tenderness to left fourth finger with surrounding ecchymosis.  No snuffbox tenderness.  Radial pulse intact bilaterally.  Mild tenderness to anterior bilateral knees.  Full extension and flexion of knees bilaterally.  Soft compartments.  Will obtain routine labs and UA to rule out electrolyte abnormalities and infectious etiologies.  Will obtain CT head and cervical spine given patient hit her head yesterday and admits to a headache to rule out intracranial hemorrhage and bony fractures of the cervical spine.  Also obtain bilateral knee x-rays and left hand x-ray to rule out bony fractures.  CBC unremarkable with no leukocytosis and normal hemoglobin.  BMP reassuring with normal renal function no major electrolyte derangements. Bilateral knee x-rays personally reviewed which demonstrate: IMPRESSION:  Modest narrowing patellofemoral joint. Other joint spaces appear    normal. No fracture, dislocation, or joint effusion.   IMPRESSION:  Narrowing patellofemoral joint with mild spurring posteriorly  arising from the patella. Other joint spaces appear unremarkable. No  evident fracture, dislocation, or joint effusion.   Left hand x-ray personally reviewed which demonstrates: IMPRESSION:  Screw and plate fixation in the distal radius region with alignment  anatomic. Previous avulsion ulnar styloid.    Small calcification medial to the distal aspect of the fifth  proximal phalanx may represent residua of old trauma or have  arthropathic etiology. This finding does not appear to represent  acute fracture. Focal clinical assessment of this area is warranted,  however.    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 Other joint  spaces appear unremarkable. No erosive change.   EKG personally reviewed which demonstrates normal sinus rhythm with no signs of acute ischemia.  UA significant for small leukocytes and many bacteria.  Given patient has been feeling generalized weakness with recent numerous falls, will treat with fosfomycin.  See note above from pharmacy.   CT head/cervical spine personally reviewed which demonstrates:   IMPRESSION:  1. No CT evidence for acute intracranial abnormality. Mild chronic  small vessel ischemic change of the white matter  2. Straightening of the cervical spine with degenerative change. No  acute fracture     Patient able to ambulate here in the ED without difficulty.  Will discharge patient with ambulatory neurology referral given patient's tremors.  Advised patient to follow-up with PCP within the next week for further evaluation. Strict ED precautions discussed with patient. Patient states understanding and agrees to plan. Patient discharged home in no acute distress and stable vitals  Discussed case with Dr. Roslynn Amble who agrees with assessment and plan.  Final Clinical Impression(s) / ED  Diagnoses Final diagnoses:  Fall, initial encounter  Injury of head, initial encounter  Acute cystitis without hematuria    Rx / DC Orders ED Discharge Orders  Ordered    nitrofurantoin, macrocrystal-monohydrate, (MACROBID) 100 MG capsule  2 times daily,   Status:  Discontinued     Reprint     12/05/19 1513    Ambulatory referral to Neurology     Discontinue  Reprint    Comments: An appointment is requested in approximately: earliest appointment for tremors   12/05/19 1952           Karie Kirks 12/05/19 1956    Lucrezia Starch, MD 12/06/19 1740

## 2019-12-05 NOTE — ED Triage Notes (Signed)
Weakness and dizziness starting approx 10 AM, fell landed onto bilateral knees and hands.  [place] also yesterday OOB and struck back of head.  Currently is not on blood thinners, no LOC.  Some noted issues with memory reported by family, alert and oriented x 4 except the day of the week.  On EMS arrival she was sitting in the front seat of her car waiting.  Stroke screen neg by EMS.  By family over last few weeks approx 12 falls.

## 2019-12-05 NOTE — ED Notes (Signed)
Pt to CT

## 2019-12-08 LAB — URINE CULTURE: Culture: >=100000 — AB

## 2019-12-09 ENCOUNTER — Telehealth: Payer: Self-pay

## 2019-12-09 NOTE — Telephone Encounter (Signed)
Post ED Visit - Positive Culture Follow-up  Culture report reviewed by antimicrobial stewardship pharmacist: [facility] Team []  Elenor Quinones, Pharm.D. []  Heide Guile, Pharm.D., BCPS AQ-ID []  Parks Neptune, Pharm.D., BCPS []  Alycia Rossetti, Pharm.D., BCPS []  [place], Pharm.D., BCPS, AAHIVP []  Legrand Como, Pharm.D., BCPS, AAHIVP [x]  Salome Arnt, PharmD, BCPS []  Johnnette Gourd, PharmD, BCPS []  Hughes Better, PharmD, BCPS []  Leeroy Cha, PharmD []  Laqueta Linden, PharmD, BCPS []  Albertina Parr, PharmD  [facility] Team []  Leodis Sias, PharmD []  Lindell Spar, PharmD []  Royetta Asal, PharmD []  Graylin Shiver, Rph []  Rema Fendt) Glennon Mac, PharmD []  Arlyn Dunning, PharmD []  Netta Cedars, PharmD []  Dia Sitter, PharmD []  Leone Haven, PharmD []  Gretta Arab, PharmD []  Theodis Shove, PharmD []  Peggyann Juba, PharmD []  Reuel Boom, PharmD   Positive urine culture Treated with Microbid, organism sensitive to the same and no further patient follow-up is required at this time.  Genia Del 12/09/2019, 8:33 AM

## 2019-12-13 ENCOUNTER — Ambulatory Visit (INDEPENDENT_AMBULATORY_CARE_PROVIDER_SITE_OTHER)
Admission: RE | Admit: 2019-12-13 | Discharge: 2019-12-13 | Disposition: A | Discharge status: Home / Self Care | Payer: Medicare HMO | Source: Ambulatory Visit | Attending: Family | Admitting: Family

## 2019-12-13 ENCOUNTER — Ambulatory Visit (INDEPENDENT_AMBULATORY_CARE_PROVIDER_SITE_OTHER): Payer: Medicare HMO | Admitting: Family

## 2019-12-13 ENCOUNTER — Encounter: Payer: Self-pay | Admitting: Family

## 2019-12-13 ENCOUNTER — Other Ambulatory Visit: Payer: Self-pay

## 2019-12-13 VITALS — BP 138/70 | HR 64 | Temp 98.2°F | Ht 62.0 in | Wt 180.6 lb

## 2019-12-13 DIAGNOSIS — J449 Chronic obstructive pulmonary disease, unspecified: Secondary | ICD-10-CM | POA: Diagnosis not present

## 2019-12-13 DIAGNOSIS — E785 Hyperlipidemia, unspecified: Secondary | ICD-10-CM | POA: Diagnosis not present

## 2019-12-13 DIAGNOSIS — I10 Essential (primary) hypertension: Secondary | ICD-10-CM

## 2019-12-13 DIAGNOSIS — M791 Myalgia, unspecified site: Secondary | ICD-10-CM | POA: Diagnosis not present

## 2019-12-13 DIAGNOSIS — R251 Tremor, unspecified: Secondary | ICD-10-CM

## 2019-12-13 DIAGNOSIS — R29818 Other symptoms and signs involving the nervous system: Secondary | ICD-10-CM | POA: Diagnosis not present

## 2019-12-13 DIAGNOSIS — F418 Other specified anxiety disorders: Secondary | ICD-10-CM | POA: Diagnosis not present

## 2019-12-13 DIAGNOSIS — M1612 Unilateral primary osteoarthritis, left hip: Secondary | ICD-10-CM | POA: Diagnosis not present

## 2019-12-13 DIAGNOSIS — R296 Repeated falls: Secondary | ICD-10-CM | POA: Diagnosis not present

## 2019-12-13 DIAGNOSIS — R519 Headache, unspecified: Secondary | ICD-10-CM | POA: Diagnosis not present

## 2019-12-13 DIAGNOSIS — Z716 Tobacco abuse counseling: Secondary | ICD-10-CM

## 2019-12-13 DIAGNOSIS — M25552 Pain in left hip: Secondary | ICD-10-CM

## 2019-12-13 LAB — SEDIMENTATION RATE: Sed Rate: 25 mm/hr (ref 0–30)

## 2019-12-13 NOTE — Progress Notes (Signed)
Tiffany Murphy is a 69 y.o. female with the following history as recorded in Epic[employer]:  Patient Active Problem List   Diagnosis Date Noted  . Subjective memory complaints 02/21/2019  . Frequency of urination- pt thinks due to HCTZ BP med 02/21/2019  . Hyperlipidemia with target LDL less than 70 11/13/2018  . Closed fracture of left distal radius 10/25/2018  . Tobacco abuse counseling 07/12/2018  . Sleep difficulties- poor sleep hygeine 07/12/2018  . Chronic pain disorder 07/12/2018  . Chronic joint pain 07/12/2018  . OSA and COPD overlap syndrome ([place]) 04/30/2018  . Chronic fatigue 04/30/2018  . Lung nodule seen on imaging study 04/30/2018  . Vitamin D deficiency 04/30/2018  . Tobacco use disorder 04/11/2018  . Elevated LDL cholesterol level 04/11/2018  . Chronic obstructive pulmonary disease ([place]) 04/11/2018  . GERD (gastroesophageal reflux disease) 04/11/2018  . Asthma 04/11/2018  . Obesity, Class I, BMI 30-34.9 04/11/2018  . Glucose intolerance (impaired glucose tolerance) 04/11/2018  . Irritable bowel syndrome 04/11/2018  . Chronic bilateral upper abdominal pain 04/11/2018  . Panic attacks 04/11/2018  . Mood disorder ([place]) 04/11/2018  . Hypokalemia 04/11/2018  . Environmental and seasonal allergies 04/11/2018  . Elevated coronary artery calcium score 12/01/2014  . Benign essential HTN 07/11/2013  . Depression with anxiety 07/11/2013  . Pure hypercholesterolemia 07/11/2013  . Nicotine addiction 06/15/2013  . Overweight 06/15/2013    Current Outpatient Medications  Medication Sig Dispense Refill  . acyclovir (ZOVIRAX) 200 MG capsule Take 1 capsule by mouth daily.    Marland Kitchen albuterol (PROVENTIL HFA;VENTOLIN HFA) 108 (90 Base) MCG/ACT inhaler Inhale 2 puffs into the lungs every 6 (six) hours as needed for wheezing. 1 Inhaler 2  . buPROPion (WELLBUTRIN SR) 150 MG 12 hr tablet TAKE 1 TABLET (150 MG TOTAL) BY MOUTH 2 (TWO) TIMES DAILY. 180 tablet 0  . Cholecalciferol (VITAMIN D) 125  MCG (5000 UT) CAPS Take 5,000 Units by mouth once a week.    . Cyanocobalamin (VITAMIN B 12 PO) Take 1 tablet by mouth daily.    Marland Kitchen dicyclomine (BENTYL) 20 MG tablet Take 1 tablet (20 mg total) by mouth 2 (two) times daily. 180 tablet 3  . fluticasone (FLONASE) 50 MCG/ACT nasal spray USE 2 SPRAYS IN EACH NOSTRIL EVERY DAY (Patient taking differently: Place 2 sprays into both nostrils daily. ) 32 g 0  . Fluticasone-Umeclidin-Vilant (TRELEGY ELLIPTA) 100-62.5-25 MCG/INH AEPB Inhale 1 puff into the lungs daily. 90 each 3  . gabapentin (NEURONTIN) 300 MG capsule TAKE 1 CAPSULE IN THE MORNING, 1 CAPSULE IN THE  AFTERNOON, AND 2 CAPSULES AT BEDTIME (Patient taking differently: Take 300 mg by mouth See admin instructions. Take 1 capsule in the morning, 1 capsule in the afternoon and 2 capsule at bedtime.) 360 capsule 0  . LORazepam (ATIVAN) 0.5 MG tablet Take 1 tablet (0.5 mg total) by mouth 2 (two) times daily as needed for anxiety. 30 tablet 1  . losartan (COZAAR) 100 MG tablet TAKE 1 TABLET BY MOUTH ONCE DAILY . (Patient taking differently: Take 100 mg by mouth daily. ) 90 tablet 0  . montelukast (SINGULAIR) 10 MG tablet TAKE 1 TABLET AT BEDTIME 90 tablet 0  . Multiple Vitamin (MULTIVITAMIN WITH MINERALS) TABS tablet Take 1 tablet by mouth daily.    . nitrofurantoin, macrocrystal-monohydrate, (MACROBID) 100 MG capsule     . Omega-3 Fatty Acids (FISH OIL) 1000 MG CPDR Take 1,000 mg by mouth daily.    Marland Kitchen omeprazole (PRILOSEC) 40 MG capsule Take 1 capsule (  40 mg total) by mouth daily. 90 capsule 3  . rosuvastatin (CRESTOR) 40 MG tablet Take 0.5 tablets (20 mg total) by mouth at bedtime. 45 tablet 0  . sertraline (ZOLOFT) 100 MG tablet Take 2 tablets (200 mg total) by mouth daily. (Patient taking differently: Take 100 mg by mouth in the morning and at bedtime. ) 180 tablet 0  . varenicline (CHANTIX) 1 MG tablet Take 1 tablet (1 mg total) by mouth 2 (two) times daily. 180 tablet 0  . potassium chloride SA  (KLOR-CON) 20 MEQ tablet Take 1 tablet (20 mEq total) by mouth 2 (two) times daily. (Patient not taking: Reported on 12/13/2019) 180 tablet 0   No current facility-administered medications for this visit.    Allergies: Penicillins, Lisinopril, Lovastatin, and Other  Past Medical History:  Diagnosis Date  . Asthma   . Closed fracture of left distal radius 10/25/2018  . COPD (chronic obstructive pulmonary disease) ([place])   . GERD (gastroesophageal reflux disease)   . Hypercholesteremia   . Hypertension   . Sleep apnea   . Tobacco abuse     Past Surgical History:  Procedure Laterality Date  . APPENDECTOMY    . COLONOSCOPY  08/26/2015   Colonic polyps status post polypectomy. Minimal sigmoid diverticulosis.   Marland Kitchen ESOPHAGOGASTRODUODENOSCOPY  02/25/2011   Large hiatal hernia otherwise normal EGD.  Marland Kitchen OPEN REDUCTION INTERNAL FIXATION (ORIF) DISTAL RADIAL FRACTURE Left 10/25/2018   Procedure: OPEN REDUCTION INTERNAL FIXATION (ORIF)LEFT  DISTAL RADIAL FRACTURE;  Surgeon: Marchia Bond, MD;  Location: [facility];  Service: Orthopedics;  Laterality: Left;  Marland Kitchen VAGINAL HYSTERECTOMY      Family History  Problem Relation Age of Onset  . Alzheimer's disease Father 38  . Diabetes Father   . CAD Mother 69  . Colon cancer Neg Hx   . Esophageal cancer Neg Hx     Social History   Tobacco Use  . Smoking status: Current Every Day Smoker    Packs/day: 1.00    Years: 44.00    Pack years: 44.00    Types: Cigarettes    Last attempt to quit: 03/24/2019    Years since quitting: 0.7  . Smokeless tobacco: Never Used  . Tobacco comment: in process of quitting  Substance Use Topics  . Alcohol use: No    Alcohol/week: 0.0 standard drinks    Subjective:  Presents today as a new patient; notes that she has been having increased problems with tremors; it looks like she has discussed memory concerns with her previous provider and is already scheduled to see a neurologist in early August;  Notes  that she has been having increased dizzy spells and has fallen at least 12 times this past year; notes that  feels like her "legs can turn into rubber."  Has COPD- under care of Dr. Ander Slade; feels like she is responding well to Trelegy; trying to quit smoking- notes she can make "one cigarette last 2 days."  History of hypertension/ hyperlipidemia- stable on Losartan and Crestor;   History of anxiety- on combination of Zoloft/ Ativan but does not feel like it is working well for her; has been referred to psychiatrist;    Objective:  Vitals:   12/13/19 1028  BP: 138/70  Pulse: 64  Temp: 98.2 F (36.8 C)  TempSrc: Oral  SpO2: 96%  Weight: 180 lb 9.6 oz (81.9 kg)  Height: 5\' 2"  (1.575 m)    General: Well developed, well nourished, in no acute distress  Skin :  Warm and dry.  Head: Normocephalic and atraumatic  Eyes: Sclera and conjunctiva clear; pupils round and reactive to light; extraocular movements intact  Ears: External normal; canals clear; tympanic membranes normal  Oropharynx: Pink, supple. No suspicious lesions  Neck: Supple without thyromegaly, adenopathy  Lungs: Respirations unlabored; clear to auscultation bilaterally without wheeze, rales, rhonchi  CVS exam: normal rate and regular rhythm.  Abdomen: Soft; nontender; nondistended; normoactive bowel sounds; no masses or hepatosplenomegaly  Musculoskeletal: No deformities; no active joint inflammation  Extremities: No edema, cyanosis, clubbing  Vessels: Symmetric bilaterally  Neurologic: Alert and oriented; speech intact; face symmetrical; moves all extremities well; CNII-XII intact without focal deficit  Assessment:  1. Tremors of nervous system   2. Nonintractable headache, unspecified chronicity pattern, unspecified headache type   3. Other symptoms and signs involving the nervous system   4. Myalgia   5. Left hip pain   6. Depression with anxiety   7. Benign essential HTN   8. Hyperlipidemia, unspecified  hyperlipidemia type   9. Chronic obstructive pulmonary disease, unspecified COPD type ([place])   10. Tobacco abuse counseling     Plan:  1. Will update MRI; keep planned follow up with neurology; 4. ? Fibromyalgia; check ANA, sed rate, RF today;  5. Update left hip X-ray;  6. Refer to psychiatrist for medication evaluation; may benefit from therapy as well; 7. Stable; stay on same medications; 8. Stay on Crestor as prescribed; 9. Needs to quit smoking completely; follow-up with her pulmonologist as scheduled;  Follow-up to be determined;  This visit occurred during the SARS-CoV-2 public health emergency.  Safety protocols were in place, including screening questions prior to the visit, additional usage of staff PPE, and extensive cleaning of exam room while observing appropriate contact time as indicated for disinfecting solutions.     No follow-ups on file.  Orders Placed This Encounter  Procedures  . MR Brain W Wo Contrast    Standing Status:   Future    Standing Expiration Date:   12/12/2020    Order Specific Question:   If indicated for the ordered procedure, I authorize the administration of contrast media per Radiology protocol    Answer:   Yes    Order Specific Question:   What is the patient's sedation requirement?    Answer:   No Sedation    Order Specific Question:   Does the patient have a pacemaker or implanted devices?    Answer:   No    Order Specific Question:   Radiology Contrast Protocol - do NOT remove file path    Answer:   \\[email or web address]    Order Specific Question:   Preferred imaging location?    Answer:   [facility] (table limit-550lbs)  . DG Hip Unilat W OR W/O Pelvis 2-3 Views Left    Standing Status:   Future    Number of Occurrences:   1    Standing Expiration Date:   12/12/2020    Order Specific Question:   Reason for Exam (SYMPTOM  OR DIAGNOSIS REQUIRED)    Answer:   left hip pain    Order Specific Question:    Preferred imaging location?    Answer:   [facility]    Order Specific Question:   Radiology Contrast Protocol - do NOT remove file path    Answer:   \\[email or web address]  . Antinuclear Antib (ANA)  . Sedimentation rate  . Rheumatoid Factor  . Ambulatory referral to Psychiatry  Referral Priority:   Routine    Referral Type:   Psychiatric    Referral Reason:   Specialty Services Required    Requested Specialty:   Psychiatry    Number of Visits Requested:   1    Requested Prescriptions    No prescriptions requested or ordered in this encounter

## 2019-12-16 ENCOUNTER — Other Ambulatory Visit: Payer: Self-pay | Admitting: Pulmonary Disease

## 2019-12-16 ENCOUNTER — Other Ambulatory Visit: Payer: Self-pay

## 2019-12-16 DIAGNOSIS — J3089 Other allergic rhinitis: Secondary | ICD-10-CM

## 2019-12-16 DIAGNOSIS — F39 Unspecified mood [affective] disorder: Secondary | ICD-10-CM

## 2019-12-16 DIAGNOSIS — J45909 Unspecified asthma, uncomplicated: Secondary | ICD-10-CM

## 2019-12-16 MED ORDER — MONTELUKAST SODIUM 10 MG PO TABS: 10.0000 mg | ORAL_TABLET | Freq: Every day | ORAL | 0 refills | Status: DC

## 2019-12-16 MED ORDER — LORAZEPAM 0.5 MG PO TABS: 0.5000 mg | ORAL_TABLET | Freq: Two times a day (BID) | ORAL | 1 refills | Status: DC | PRN

## 2019-12-16 NOTE — Telephone Encounter (Signed)
1.Medication Requested:montelukast (SINGULAIR) 10 MG tablet LORazepam (ATIVAN) 0.5 MG tablet  2. Pharmacy (Name, [facility], [place], [place]  3. On Med List: Yes   4. Last Visit with PCP: 6.25.2021   5. Next visit date with PCP: n/a   Agent: Please be advised that RX refills may take up to 3 business days. We ask that you follow-up with your pharmacy.

## 2019-12-16 NOTE — Telephone Encounter (Signed)
[place] Controlled Database Checked Last filled:  10/23/19 # 30 LOV w/you:  12/13/19 Next appt w/you:  none

## 2019-12-17 LAB — RHEUMATOID FACTOR: Rheumatoid fact SerPl-aCnc: <14 IU/mL (ref <14)

## 2019-12-17 LAB — ANA: Anti Nuclear Antibody (ANA): NEGATIVE

## 2019-12-25 ENCOUNTER — Encounter: Payer: Self-pay | Admitting: Family

## 2020-01-26 ENCOUNTER — Other Ambulatory Visit: Payer: Self-pay | Admitting: Family Medicine

## 2020-01-26 DIAGNOSIS — J3089 Other allergic rhinitis: Secondary | ICD-10-CM

## 2020-01-29 ENCOUNTER — Telehealth: Payer: Self-pay | Admitting: Family

## 2020-01-29 ENCOUNTER — Ambulatory Visit: Payer: Medicare HMO | Admitting: Neurology

## 2020-01-29 NOTE — Telephone Encounter (Signed)
1.Medication Requested:  rosuvastatin (CRESTOR) 40 MG tablet LORazepam (ATIVAN) 0.5 MG tablet gabapentin (NEURONTIN) 300 MG capsule  2. Pharmacy (Name, Street, [place]):[facility] ([facility] [place] - [address]  3. On Med List: yes  4. Last Visit with PCP: 12/13/19  5. Next visit date with PCP:   Agent: Please be advised that RX refills may take up to 3 business days. We ask that you follow-up with your pharmacy.

## 2020-01-31 ENCOUNTER — Telehealth: Payer: Self-pay | Admitting: Pulmonary Disease

## 2020-01-31 ENCOUNTER — Other Ambulatory Visit: Payer: Self-pay | Admitting: Family

## 2020-01-31 DIAGNOSIS — G894 Chronic pain syndrome: Secondary | ICD-10-CM

## 2020-01-31 DIAGNOSIS — M2550 Pain in unspecified joint: Secondary | ICD-10-CM

## 2020-01-31 DIAGNOSIS — F39 Unspecified mood [affective] disorder: Secondary | ICD-10-CM

## 2020-01-31 DIAGNOSIS — G8929 Other chronic pain: Secondary | ICD-10-CM

## 2020-01-31 DIAGNOSIS — E785 Hyperlipidemia, unspecified: Secondary | ICD-10-CM

## 2020-01-31 MED ORDER — LORAZEPAM 0.5 MG PO TABS: 0.5000 mg | ORAL_TABLET | Freq: Two times a day (BID) | ORAL | 1 refills | Status: DC | PRN

## 2020-01-31 MED ORDER — GABAPENTIN 300 MG PO CAPS: 300.0000 mg | ORAL_CAPSULE | ORAL | 0 refills | Status: DC

## 2020-01-31 MED ORDER — TRELEGY ELLIPTA 100-62.5-25 MCG/INH IN AEPB: 1.0000 | INHALATION_SPRAY | Freq: Every day | RESPIRATORY_TRACT | 5 refills | Status: DC

## 2020-01-31 MED ORDER — ROSUVASTATIN CALCIUM 40 MG PO TABS: 20.0000 mg | ORAL_TABLET | Freq: Every day | ORAL | 0 refills | Status: DC

## 2020-01-31 NOTE — Telephone Encounter (Signed)
Refill for Trelegy sent to preferred pharmacy, refill for flonase is being sent by another doctor, refill is pending.

## 2020-02-20 ENCOUNTER — Other Ambulatory Visit: Payer: Medicare HMO

## 2020-02-22 ENCOUNTER — Other Ambulatory Visit: Payer: Self-pay

## 2020-02-22 ENCOUNTER — Ambulatory Visit
Admission: RE | Admit: 2020-02-22 | Discharge: 2020-02-22 | Disposition: A | Discharge status: Home / Self Care | Payer: Medicare Other | Source: Ambulatory Visit | Attending: Family | Admitting: Family

## 2020-02-22 DIAGNOSIS — G930 Cerebral cysts: Secondary | ICD-10-CM | POA: Diagnosis not present

## 2020-02-22 DIAGNOSIS — G9389 Other specified disorders of brain: Secondary | ICD-10-CM | POA: Diagnosis not present

## 2020-02-22 DIAGNOSIS — R519 Headache, unspecified: Secondary | ICD-10-CM

## 2020-02-22 DIAGNOSIS — R29818 Other symptoms and signs involving the nervous system: Secondary | ICD-10-CM

## 2020-02-22 DIAGNOSIS — I6782 Cerebral ischemia: Secondary | ICD-10-CM | POA: Diagnosis not present

## 2020-02-22 DIAGNOSIS — H748X1 Other specified disorders of right middle ear and mastoid: Secondary | ICD-10-CM | POA: Diagnosis not present

## 2020-02-22 MED ORDER — GADOBENATE DIMEGLUMINE 529 MG/ML IV SOLN
17.0000 mL | Freq: Once | INTRAVENOUS | Status: AC | PRN
  Administered 2020-02-22: 17 mL via INTRAVENOUS

## 2020-02-25 ENCOUNTER — Ambulatory Visit (INDEPENDENT_AMBULATORY_CARE_PROVIDER_SITE_OTHER): Payer: Medicare Other | Admitting: Neurology

## 2020-02-25 ENCOUNTER — Telehealth: Payer: Self-pay

## 2020-02-25 ENCOUNTER — Encounter: Payer: Self-pay | Admitting: Neurology

## 2020-02-25 ENCOUNTER — Other Ambulatory Visit: Payer: Self-pay

## 2020-02-25 VITALS — BP 134/82 | HR 59 | Ht 62.0 in | Wt 187.0 lb

## 2020-02-25 DIAGNOSIS — M2550 Pain in unspecified joint: Secondary | ICD-10-CM

## 2020-02-25 DIAGNOSIS — R251 Tremor, unspecified: Secondary | ICD-10-CM

## 2020-02-25 NOTE — Telephone Encounter (Signed)
Pt left a VM asking for a call to confirm the appt for today.

## 2020-02-25 NOTE — Telephone Encounter (Signed)
I called the pt and we confirmed the appt for today (02/25/2020) at 330 check in time of 3. Pt had no further questions/concerns and will keep appt as scheduled.

## 2020-02-25 NOTE — Patient Instructions (Signed)
You have a rather mild tremor of both hands.  I do not see any signs or symptoms of parkinson's like disease or what we call parkinsonism.   For your tremor, I would not recommend any new medication for fear of side effects (especially sleepiness) or medication interactions, especially in light of the possibility of side effects with medication for tremors.   Please remember, that any kind of tremor may be exacerbated by anxiety, anger, nervousness, excitement, dehydration, sleep deprivation, thyroid disease, by caffeine, and low blood sugar values or blood sugar fluctuations. Some medications can exacerbate tremors, this includes antidepressant medication such as sertraline and inhalers for COPD or asthma such as albuterol.  For your joint pain, please follow-up with your primary care nurse practitioner.  It looks like you have discussed the possibility of seeing rheumatology and your primary care has also done some labs.  For your balance issue I recommend that you use a cane or walker at all times for safety.  Please try to increase her water intake, 6 to 8 cups of water per day are recommended, generally speaking.  Please try to limit your caffeine intake to one or two servings per day of any caffeine-containing beverage including coffee or soda or tea.  I would be happy to see you back as needed.

## 2020-02-25 NOTE — Progress Notes (Signed)
Subjective:    Patient ID: Tiffany Murphy is a 69 y.o. female.  HPI     Star Age, MD, PhD [facility] [address] [place], [place] [address]  I saw patient, Tiffany Murphy, as a referral from the ED for tremors. The patient is unaccompanied today. Ms. Bermea is a 69 year old right-handed woman with an underlying medical history of sleep apnea, asthma, smoking, hypertension, COPD, reflux disease, and obesity, who reports intermittent hand tremors in both upper extremities for years.  She recalls that she had hand tremors in her 60s and 30s.  She has no family history of tremors, mom died at 68, father lived to be 69, he had Alzheimer's dementia.  She is worried about memory loss, she has had forgetfulness.  She has noticed an intermittent hand tremor.  It comes and goes, some days are worse than others.  She has noticed a correlation with her albuterol inhaler which makes the tremor worse.  Of note, she has been on sertraline for years.  Prior to that she was on different antidepressant medications.  She does report stress and stress makes the tremor also worse.  She has tried to hydrate well with water but lately has reduced her water intake to some degree.  She has been drinking coffee and tea and soda daily.  Coffee typically one or 2 cups in the morning, not necessarily daily tea, but drinks about 2 cans of soda with caffeine daily, 12 ounce cans.  She has fallen.  She does not typically use a cane but has one.  She reports joint pain affecting multiple areas including both hips, both knees, both ankles, both wrists, both hands.  She also has neck pain.  She has a history of psoriasis and wonders if she has psoriatic arthritis.  She has not seen a dermatologist in 2 to 3 years.  She presented to the emergency room on 12/05/2019 with a history of falls and tremors.  I reviewed the emergency room records. She reported a fall hitting her head recently. She had  work-up with a head CT and cervical spine CT without contrast on 12/05/19, and I reviewed the results:   IMPRESSION: 1. No CT evidence for acute intracranial abnormality. Mild chronic small vessel ischemic change of the white matter 2. Straightening of the cervical spine with degenerative change. No acute fracture. She is separated, she has two grown children, one son, one daughter.  Her granddaughter lives with her.  She quit smoking on 02/01/2020.  She does not drink any alcohol.  Per primary care nurse practitioner she was scheduled for a brain with and without contrast, on 02/22/20 and I reviewed the results: IMPRESSION: 1. No explanation for headache. 2. Mild senescent findings.   Her Past Medical History Is Significant For: Past Medical History:  Diagnosis Date   Anxiety    Asthma    Bronchitis    Closed fracture of left distal radius 10/25/2018   COPD (chronic obstructive pulmonary disease) (HCC)    Depression    GERD (gastroesophageal reflux disease)    Headache    Hypercholesteremia    Hypertension    IBS (irritable bowel syndrome)    Sleep apnea    Tobacco abuse     Her Past Surgical History Is Significant For: Past Surgical History:  Procedure Laterality Date   APPENDECTOMY     COLONOSCOPY  08/26/2015   Colonic polyps status post polypectomy. Minimal sigmoid diverticulosis.    ESOPHAGOGASTRODUODENOSCOPY  02/25/2011   Large hiatal hernia otherwise normal EGD.   OPEN REDUCTION INTERNAL FIXATION (ORIF) DISTAL RADIAL FRACTURE Left 10/25/2018   Procedure: OPEN REDUCTION INTERNAL FIXATION (ORIF)LEFT  DISTAL RADIAL FRACTURE;  Surgeon: Marchia Bond, MD;  Location: [facility];  Service: Orthopedics;  Laterality: Left;   VAGINAL HYSTERECTOMY      Her Family History Is Significant For: Family History  Problem Relation Age of Onset   Alzheimer's disease Father 68   Diabetes Father    CAD Mother 15   Colon cancer Neg Hx    Esophageal  cancer Neg Hx     Her Social History Is Significant For: Social History   Socioeconomic History   Marital status: Married    Spouse name: Not on file   Number of children: 2   Years of education: Not on file   Highest education level: Not on file  Occupational History   Not on file  Tobacco Use   Smoking status: Former Smoker    Packs/day: 1.00    Years: 44.00    Pack years: 44.00    Types: Cigarettes    Quit date: 12/19/2019    Years since quitting: 0.1   Smokeless tobacco: Never Used   Tobacco comment: in process of quitting  Vaping Use   Vaping Use: Former   Devices: did it for 6 months and stopped   Substance and Sexual Activity   Alcohol use: No    Alcohol/week: 0.0 standard drinks   Drug use: Yes    Types: Marijuana    Comment: 2 puffs per day   Sexual activity: Not Currently  Other Topics Concern   Not on file  Social History Narrative   Works at [employer].    Social Determinants of Health   Financial Resource Strain:    Difficulty of Paying Living Expenses: Not on file  Food Insecurity:    Worried About Charity fundraiser in the Last Year: Not on file   [employer] of Food in the Last Year: Not on file  Transportation Needs:    Lack of Transportation (Medical): Not on file   Lack of Transportation (Non-Medical): Not on file  Physical Activity:    Days of Exercise per Week: Not on file   Minutes of Exercise per Session: Not on file  Stress:    Feeling of Stress : Not on file  Social Connections:    Frequency of Communication with Friends and Family: Not on file   Frequency of Social Gatherings with Friends and Family: Not on file   Attends Religious Services: Not on file   Active Member of [employer] or Organizations: Not on file   Attends Archivist Meetings: Not on file   Marital Status: Not on file    Her Allergies Are:  Allergies  Allergen Reactions   Penicillins Hives and Shortness Of Breath    Lisinopril Other (See Comments)    Makes feel bad   Lovastatin     Cramps in legs and feet   Other     Fragrances in soaps, perfumes, shampoos, conditioners  :   Her Current Medications Are:  Outpatient Encounter Medications as of 02/25/2020  Medication Sig   acyclovir (ZOVIRAX) 200 MG capsule Take 1 capsule by mouth daily.   albuterol (PROVENTIL HFA;VENTOLIN HFA) 108 (90 Base) MCG/ACT inhaler Inhale 2 puffs into the lungs every 6 (six) hours as needed for wheezing.   b complex vitamins capsule Take 1 capsule by  mouth daily.   Cholecalciferol (VITAMIN D) 125 MCG (5000 UT) CAPS Take 5,000 Units by mouth once a week.   Cyanocobalamin (VITAMIN B 12 PO) Take 1 tablet by mouth daily.   fluticasone (FLONASE) 50 MCG/ACT nasal spray USE 2 SPRAYS IN EACH NOSTRIL EVERY DAY (Patient taking differently: Place 2 sprays into both nostrils daily. )   Fluticasone-Umeclidin-Vilant (TRELEGY ELLIPTA) 100-62.5-25 MCG/INH AEPB Inhale 1 puff into the lungs daily.   gabapentin (NEURONTIN) 300 MG capsule Take 1 capsule (300 mg total) by mouth See admin instructions. Take 1 capsule in the morning, 1 capsule in the afternoon and 2 capsule at bedtime.   LORazepam (ATIVAN) 0.5 MG tablet Take 1 tablet (0.5 mg total) by mouth 2 (two) times daily as needed for anxiety.   losartan (COZAAR) 100 MG tablet TAKE 1 TABLET BY MOUTH ONCE DAILY . (Patient taking differently: Take 100 mg by mouth daily. )   montelukast (SINGULAIR) 10 MG tablet Take 1 tablet (10 mg total) by mouth at bedtime.   Multiple Vitamin (MULTIVITAMIN WITH MINERALS) TABS tablet Take 1 tablet by mouth daily.   nitrofurantoin, macrocrystal-monohydrate, (MACROBID) 100 MG capsule    Omega-3 Fatty Acids (FISH OIL) 1000 MG CPDR Take 1,000 mg by mouth daily.   omeprazole (PRILOSEC) 40 MG capsule Take 1 capsule (40 mg total) by mouth daily.   rosuvastatin (CRESTOR) 40 MG tablet Take 0.5 tablets (20 mg total) by mouth at bedtime.   sertraline  (ZOLOFT) 100 MG tablet Take 2 tablets (200 mg total) by mouth daily. (Patient taking differently: Take 100 mg by mouth in the morning and at bedtime. )   [DISCONTINUED] buPROPion (WELLBUTRIN SR) 150 MG 12 hr tablet TAKE 1 TABLET (150 MG TOTAL) BY MOUTH 2 (TWO) TIMES DAILY.   [DISCONTINUED] dicyclomine (BENTYL) 20 MG tablet Take 1 tablet (20 mg total) by mouth 2 (two) times daily.   [DISCONTINUED] potassium chloride SA (KLOR-CON) 20 MEQ tablet Take 1 tablet (20 mEq total) by mouth 2 (two) times daily. (Patient not taking: Reported on 12/13/2019)   [DISCONTINUED] varenicline (CHANTIX) 1 MG tablet Take 1 tablet (1 mg total) by mouth 2 (two) times daily.   No facility-administered encounter medications on file as of 02/25/2020.  :   Review of Systems:  Out of a complete 14 point review of systems, all are reviewed and negative with the exception of these symptoms as listed below:  Review of Systems  Neurological:       Pt reports she is here to discuss worsening bilateral hand tremors. She reports her sx are intermittent but equal in both when present. She also reports an increase in falls around 25 times over the past years. She sts her memory had declined over the last year. She confirms she has hit her head some during the falls that have taken place.  She is right handed.    Objective:  Neurological Exam  Physical Exam Physical Examination:   Vitals:   02/25/20 1533  BP: 134/82  Pulse: (!) 59  SpO2: 95%    General Examination: The patient is a very pleasant 69 y.o. female in no acute distress. She appears well-developed and well-nourished and well groomed.   HEENT: Normocephalic, atraumatic, pupils are equal, round and reactive to light and accommodation. Funduscopic exam is difficult due to bilateral cataracts noted.  Extraocular tracking is preserved in all directions, no nystagmus.  She has prescription eyeglasses.  No gaze limitation.  Hearing is grossly intact.  Normal smooth  pursuit is  noted. Hearing is grossly intact. Face is symmetric with normal facial animation and normal facial sensation. Speech is clear with no dysarthria noted. There is no hypophonia. There is no lip, neck/head, jaw or voice tremor. Neck is supple with full range of passive and active motion. There are no carotid bruits on auscultation. Oropharynx exam reveals: mild mouth dryness, adequate dental hygiene. Tongue protrudes centrally and palate elevates symmetrically.   Chest: Clear to auscultation without wheezing, rhonchi or crackles noted.  Heart: S1+S2+0, regular and normal without murmurs, rubs or gallops noted.   Abdomen: Soft, non-tender and non-distended with normal bowel sounds appreciated on auscultation.  Extremities: There is no pitting edema in the distal lower extremities bilaterally. Pedal pulses are intact.  Skin: Warm and dry without trophic changes noted.  Chronic appearing skin changes in her distal arms and legs including hypopigmentation and hyperpigmentation.  Musculoskeletal: exam reveals arthritic changes in both hands, she has bilateral knee pain, left more than right hip pain, reports pain in both ankles.  Right knee appears to be mildly swollen.  No obvious wrist swelling but reports pain in both hands.   Neurologically:  Mental status: The patient is awake, alert and oriented in all 4 spheres. Her immediate and remote memory, attention, language skills and fund of knowledge are appropriate. There is no evidence of aphasia, agnosia, apraxia or anomia. Speech is clear with normal prosody and enunciation. Thought process is linear. Mood is normal and affect is normal.  Cranial nerves II - XII are as described above under HEENT exam. In addition: shoulder shrug is normal with equal shoulder height noted. Motor exam: Normal bulk, strength and tone is noted. There is no drift.  On 02/25/2020: On Archimedes spiral drawing she has mild insecurity with both hands, left which is  her nondominant hand more insecure.  Handwriting is legible, not particularly tremulous, not micrographic.  She has a minimal postural tremor in both upper extremities, minimal action tremor in both upper extremities, no intention tremor, no resting tremor, no lower extremity tremor. Romberg is not tested due to safety concerns.  Reflexes are 1+ in the upper extremities and knees, absent in both ankles.  Babinski: Toes are flexor bilaterally. Fine motor skills and coordination: intact with normal finger taps, normal hand movements, normal rapid alternating patting, normal foot taps and normal foot agility.  Cerebellar testing: No dysmetria or intention tremor on finger to nose testing. Heel to shin is difficult due to hip discomfort bilaterally.   Sensory exam: intact to light touch, vibration, temperature sense in the upper and lower extremities.  Gait, station and balance: She stands with mild difficulty and pushes herself up.  Posture is age-appropriate.  She walks with a slight limp on the right side, reports pain in both hips and both knees when walking.  She has no walking aid.  No shuffling noted, has preserved arm swing.  No tremor while walking.   Assessment and Plan:  In summary, Itzabella Sorrels is a very pleasant 69 y.o.-year old female with an underlying medical history of sleep apnea, asthma, smoking, hypertension, COPD, reflux disease, and obesity, who presents as a referral from the emergency room for her tremors.  On examination, she has a mild bilateral upper extremity postural and action tremor.  No telltale family history of tremor, no evidence of parkinsonism.  While she may have underlying essential tremor, contributors do include stress, caffeine intake and certain medications including her albuterol and sertraline possibly.  I talked to the patient  at length about this.  I would not recommend symptomatic medication at this time given the mild presentation and also the concern for possible  side effects from symptomatic treatment for tremors.  Given her COPD I would not recommend a trial of a beta-blocker. Mysoline may add balance issues and increase her fall risk.  She is advised to stay well-hydrated with water, curb her caffeine intake, and use a cane for gait safety.  She recently had work-up through her primary care including rheumatoid factor, ESR and ANA, all negative.  Nevertheless, given her widespread joint pain and her history of psoriasis and her concern if she could have underlying psoriatic arthritis which I could not answer for her, she is encouraged to talk to her primary care about the possibility of seeing a rheumatologist.  She also had a recent brain MRI which did not show any acute findings.  She is advised to follow-up with her primary care.  I would be happy to see her back if needed.  I answered all her questions today and she was in agreement.   Star Age, MD, PhD

## 2020-03-19 ENCOUNTER — Telehealth: Payer: Self-pay | Admitting: Family

## 2020-03-19 NOTE — Telephone Encounter (Signed)
Patient called and is requesting a medication refill for montelukast (SINGULAIR) 10 MG tablet  acyclovir (ZOVIRAX) 200 MG capsule  [facility] ([facility] [place] [facility] [place]   She no longer uses the [facility].

## 2020-03-23 ENCOUNTER — Other Ambulatory Visit: Payer: Self-pay | Admitting: Family

## 2020-03-23 DIAGNOSIS — J3089 Other allergic rhinitis: Secondary | ICD-10-CM

## 2020-03-23 DIAGNOSIS — J45909 Unspecified asthma, uncomplicated: Secondary | ICD-10-CM

## 2020-03-23 MED ORDER — ACYCLOVIR 200 MG PO CAPS: 200.0000 mg | ORAL_CAPSULE | Freq: Every day | ORAL | 1 refills | Status: DC

## 2020-03-23 MED ORDER — MONTELUKAST SODIUM 10 MG PO TABS: 10.0000 mg | ORAL_TABLET | Freq: Every day | ORAL | 1 refills | Status: DC

## 2020-04-22 ENCOUNTER — Emergency Department (HOSPITAL_COMMUNITY): Payer: Medicare Other

## 2020-04-22 ENCOUNTER — Emergency Department (HOSPITAL_COMMUNITY)
Admission: EM | Admit: 2020-04-22 | Discharge: 2020-04-22 | Disposition: A | Discharge status: Home / Self Care | Payer: Medicare Other | Attending: Emergency Medicine | Admitting: Emergency Medicine

## 2020-04-22 ENCOUNTER — Other Ambulatory Visit: Payer: Self-pay

## 2020-04-22 DIAGNOSIS — Z7951 Long term (current) use of inhaled steroids: Secondary | ICD-10-CM | POA: Diagnosis not present

## 2020-04-22 DIAGNOSIS — Z87891 Personal history of nicotine dependence: Secondary | ICD-10-CM | POA: Insufficient documentation

## 2020-04-22 DIAGNOSIS — Z79899 Other long term (current) drug therapy: Secondary | ICD-10-CM | POA: Diagnosis not present

## 2020-04-22 DIAGNOSIS — M542 Cervicalgia: Secondary | ICD-10-CM | POA: Insufficient documentation

## 2020-04-22 DIAGNOSIS — W06XXXA Fall from bed, initial encounter: Secondary | ICD-10-CM | POA: Insufficient documentation

## 2020-04-22 DIAGNOSIS — J449 Chronic obstructive pulmonary disease, unspecified: Secondary | ICD-10-CM | POA: Diagnosis not present

## 2020-04-22 DIAGNOSIS — I10 Essential (primary) hypertension: Secondary | ICD-10-CM | POA: Insufficient documentation

## 2020-04-22 DIAGNOSIS — S199XXA Unspecified injury of neck, initial encounter: Secondary | ICD-10-CM | POA: Diagnosis not present

## 2020-04-22 DIAGNOSIS — S0181XA Laceration without foreign body of other part of head, initial encounter: Secondary | ICD-10-CM | POA: Diagnosis not present

## 2020-04-22 DIAGNOSIS — M47812 Spondylosis without myelopathy or radiculopathy, cervical region: Secondary | ICD-10-CM | POA: Diagnosis not present

## 2020-04-22 DIAGNOSIS — S0101XA Laceration without foreign body of scalp, initial encounter: Secondary | ICD-10-CM | POA: Diagnosis not present

## 2020-04-22 DIAGNOSIS — I6523 Occlusion and stenosis of bilateral carotid arteries: Secondary | ICD-10-CM | POA: Diagnosis not present

## 2020-04-22 DIAGNOSIS — S0990XA Unspecified injury of head, initial encounter: Secondary | ICD-10-CM | POA: Diagnosis not present

## 2020-04-22 DIAGNOSIS — M2578 Osteophyte, vertebrae: Secondary | ICD-10-CM | POA: Diagnosis not present

## 2020-04-22 DIAGNOSIS — S0003XA Contusion of scalp, initial encounter: Secondary | ICD-10-CM | POA: Diagnosis not present

## 2020-04-22 DIAGNOSIS — Y9384 Activity, sleeping: Secondary | ICD-10-CM | POA: Diagnosis not present

## 2020-04-22 DIAGNOSIS — S0191XA Laceration without foreign body of unspecified part of head, initial encounter: Secondary | ICD-10-CM

## 2020-04-22 LAB — BASIC METABOLIC PANEL
Anion gap: 10 (ref 5–15)
BUN: 16 mg/dL (ref 8–23)
CO2: 26 mmol/L (ref 22–32)
Calcium: 9.3 mg/dL (ref 8.9–10.3)
Chloride: 104 mmol/L (ref 98–111)
Creatinine, Ser: 0.86 mg/dL (ref 0.44–1.00)
GFR, Estimated: >60 mL/min (ref >60)
Glucose, Bld: 133 mg/dL — ABNORMAL HIGH (ref 70–99)
Potassium: 4 mmol/L (ref 3.5–5.1)
Sodium: 140 mmol/L (ref 135–145)

## 2020-04-22 LAB — CBC
HCT: 43.4 % (ref 36.0–46.0)
Hemoglobin: 13.8 g/dL (ref 12.0–15.0)
MCH: 29.3 pg (ref 26.0–34.0)
MCHC: 31.8 g/dL (ref 30.0–36.0)
MCV: 92.1 fL (ref 80.0–100.0)
Platelets: 214 10*3/uL (ref 150–400)
RBC: 4.71 MIL/uL (ref 3.87–5.11)
RDW: 14.3 % (ref 11.5–15.5)
WBC: 7.2 10*3/uL (ref 4.0–10.5)
nRBC: 0 % (ref 0.0–0.2)

## 2020-04-22 MED ORDER — LIDOCAINE-EPINEPHRINE 1 %-1:100000 IJ SOLN
10.0000 mL | Freq: Once | INTRAMUSCULAR | Status: AC
  Administered 2020-04-22: 10 mL via INTRADERMAL
  Filled 2020-04-22: qty 1

## 2020-04-22 NOTE — ED Triage Notes (Addendum)
Pt here pov with a head lac after rolling out of bed while sleeping. Pt hit her head on the corner of dresser. Pt denies being on any blood thinners. Pt a&ox4 denies loc. Pt states that she has lost a lot of blood. Pt states that she is a little dizzy since losing some blood. Pt states she does have neck pain. RN applied c-collar.

## 2020-04-22 NOTE — ED Provider Notes (Signed)
[facility] EMERGENCY DEPARTMENT Provider Note   CSN: [id number] Arrival date & time: 04/22/20  2025     History Chief Complaint  Patient presents with  . Fall  . Head Laceration    Tiffany Murphy is a 69 y.o. female.  The history is provided by the patient and medical records. No language interpreter was used.  Head Injury Location:  R parietal Time since incident:  7 hours Mechanism of injury: fall   Fall:    Fall occurred:  From a bed   Impact surface:  Banker of impact:  Head   Entrapped after fall: no   Pain details:    Quality:  Aching   Severity:  Moderate   Timing:  Constant   Progression:  Unchanged Chronicity:  New Relieved by:  Nothing Worsened by:  Nothing Ineffective treatments:  None tried Associated symptoms: headache and neck pain   Associated symptoms: no blurred vision, no disorientation, no double vision, no focal weakness, no loss of consciousness, no memory loss, no nausea, no seizures and no vomiting        Past Medical History:  Diagnosis Date  . Anxiety   . Asthma   . Bronchitis   . Closed fracture of left distal radius 10/25/2018  . COPD (chronic obstructive pulmonary disease) ([place])   . Depression   . GERD (gastroesophageal reflux disease)   . Headache   . Hypercholesteremia   . Hypertension   . IBS (irritable bowel syndrome)   . Sleep apnea   . Tobacco abuse     Patient Active Problem List   Diagnosis Date Noted  . Subjective memory complaints 02/21/2019  . Frequency of urination- pt thinks due to HCTZ BP med 02/21/2019  . Hyperlipidemia with target LDL less than 70 11/13/2018  . Closed fracture of left distal radius 10/25/2018  . Tobacco abuse counseling 07/12/2018  . Sleep difficulties- poor sleep hygeine 07/12/2018  . Chronic pain disorder 07/12/2018  . Chronic joint pain 07/12/2018  . OSA and COPD overlap syndrome ([place]) 04/30/2018  . Chronic fatigue 04/30/2018  . Lung nodule seen on imaging  study 04/30/2018  . Vitamin D deficiency 04/30/2018  . Tobacco use disorder 04/11/2018  . Elevated LDL cholesterol level 04/11/2018  . Chronic obstructive pulmonary disease ([place]) 04/11/2018  . GERD (gastroesophageal reflux disease) 04/11/2018  . Asthma 04/11/2018  . Obesity, Class I, BMI 30-34.9 04/11/2018  . Glucose intolerance (impaired glucose tolerance) 04/11/2018  . Irritable bowel syndrome 04/11/2018  . Chronic bilateral upper abdominal pain 04/11/2018  . Panic attacks 04/11/2018  . Mood disorder ([place]) 04/11/2018  . Hypokalemia 04/11/2018  . Environmental and seasonal allergies 04/11/2018  . Elevated coronary artery calcium score 12/01/2014  . Benign essential HTN 07/11/2013  . Depression with anxiety 07/11/2013  . Pure hypercholesterolemia 07/11/2013  . Nicotine addiction 06/15/2013  . Overweight 06/15/2013    Past Surgical History:  Procedure Laterality Date  . APPENDECTOMY    . COLONOSCOPY  08/26/2015   Colonic polyps status post polypectomy. Minimal sigmoid diverticulosis.   Marland Kitchen ESOPHAGOGASTRODUODENOSCOPY  02/25/2011   Large hiatal hernia otherwise normal EGD.  Marland Kitchen OPEN REDUCTION INTERNAL FIXATION (ORIF) DISTAL RADIAL FRACTURE Left 10/25/2018   Procedure: OPEN REDUCTION INTERNAL FIXATION (ORIF)LEFT  DISTAL RADIAL FRACTURE;  Surgeon: Marchia Bond, MD;  Location: [facility];  Service: Orthopedics;  Laterality: Left;  Marland Kitchen VAGINAL HYSTERECTOMY       OB History   No obstetric history on file.  Family History  Problem Relation Age of Onset  . Alzheimer's disease Father 21  . Diabetes Father   . CAD Mother 20  . Colon cancer Neg Hx   . Esophageal cancer Neg Hx     Social History   Tobacco Use  . Smoking status: Former Smoker    Packs/day: 1.00    Years: 44.00    Pack years: 44.00    Types: Cigarettes    Quit date: 12/19/2019    Years since quitting: 0.3  . Smokeless tobacco: Never Used  . Tobacco comment: in process of quitting  Vaping Use  .  Vaping Use: Former  . Devices: did it for 6 months and stopped   Substance Use Topics  . Alcohol use: No    Alcohol/week: 0.0 standard drinks  . Drug use: Yes    Types: Marijuana    Comment: 2 puffs per day    Home Medications Prior to Admission medications   Medication Sig Start Date End Date Taking? Authorizing Provider  acyclovir (ZOVIRAX) 200 MG capsule Take 1 capsule (200 mg total) by mouth daily. 03/23/20   Marrian Salvage, FNP  albuterol (PROVENTIL HFA;VENTOLIN HFA) 108 (90 Base) MCG/ACT inhaler Inhale 2 puffs into the lungs every 6 (six) hours as needed for wheezing. 04/30/18   Mellody Dance, DO  b complex vitamins capsule Take 1 capsule by mouth daily.    [provider]  Cholecalciferol (VITAMIN D) 125 MCG (5000 UT) CAPS Take 5,000 Units by mouth once a week.    [provider]  Cyanocobalamin (VITAMIN B 12 PO) Take 1 tablet by mouth daily.    [provider]  fluticasone (FLONASE) 50 MCG/ACT nasal spray USE 2 SPRAYS IN EACH NOSTRIL EVERY DAY Patient taking differently: Place 2 sprays into both nostrils daily.  07/15/19   Opalski, Neoma Laming, DO  Fluticasone-Umeclidin-Vilant (TRELEGY ELLIPTA) 100-62.5-25 MCG/INH AEPB Inhale 1 puff into the lungs daily. 01/31/20   Olalere, Cicero Duck A, MD  gabapentin (NEURONTIN) 300 MG capsule Take 1 capsule (300 mg total) by mouth See admin instructions. Take 1 capsule in the morning, 1 capsule in the afternoon and 2 capsule at bedtime. 01/31/20   Marrian Salvage, FNP  LORazepam (ATIVAN) 0.5 MG tablet Take 1 tablet (0.5 mg total) by mouth 2 (two) times daily as needed for anxiety. 01/31/20   Marrian Salvage, FNP  losartan (COZAAR) 100 MG tablet TAKE 1 TABLET BY MOUTH ONCE DAILY . Patient taking differently: Take 100 mg by mouth daily.  11/14/19   Abonza, Maritza, PA-C  montelukast (SINGULAIR) 10 MG tablet Take 1 tablet (10 mg total) by mouth at bedtime. 03/23/20   Marrian Salvage, FNP  Multiple Vitamin  (MULTIVITAMIN WITH MINERALS) TABS tablet Take 1 tablet by mouth daily.    [provider]  nitrofurantoin, macrocrystal-monohydrate, (MACROBID) 100 MG capsule  12/05/19   [provider]  Omega-3 Fatty Acids (FISH OIL) 1000 MG CPDR Take 1,000 mg by mouth daily.    [provider]  omeprazole (PRILOSEC) 40 MG capsule Take 1 capsule (40 mg total) by mouth daily. 11/29/19   Lorrene Reid, PA-C  rosuvastatin (CRESTOR) 40 MG tablet Take 0.5 tablets (20 mg total) by mouth at bedtime. 01/31/20   Marrian Salvage, FNP  sertraline (ZOLOFT) 100 MG tablet Take 2 tablets (200 mg total) by mouth daily. Patient taking differently: Take 100 mg by mouth in the morning and at bedtime.  11/19/19   Lorrene Reid, PA-C    Allergies  Penicillins, Lisinopril, Lovastatin, and Other  Review of Systems   Review of Systems  Constitutional: Negative for chills, fatigue and fever.  HENT: Negative for congestion.   Eyes: Negative for blurred vision and double vision.  Respiratory: Negative for cough, chest tightness, shortness of breath and wheezing.   Cardiovascular: Negative for chest pain.  Gastrointestinal: Negative for abdominal pain, diarrhea, nausea and vomiting.  Genitourinary: Negative for dysuria.  Musculoskeletal: Positive for neck pain. Negative for back pain and neck stiffness.  Skin: Positive for wound.  Neurological: Positive for headaches. Negative for dizziness, focal weakness, seizures, loss of consciousness and light-headedness.  Psychiatric/Behavioral: Negative for agitation and memory loss.  All other systems reviewed and are negative.   Physical Exam Updated Vital Signs BP (!) 151/57 (BP Location: Left Arm)   Pulse 74   Temp 98 F (36.7 C) (Oral)   Resp 17   SpO2 100%   Physical Exam Vitals and nursing note reviewed.  Constitutional:      General: She is not in acute distress.    Appearance: She is well-developed. She is not ill-appearing,  toxic-appearing or diaphoretic.  HENT:     Head: Laceration present.      Right Ear: External ear normal.     Left Ear: External ear normal.     Nose: Nose normal. No congestion or rhinorrhea.     Mouth/Throat:     Mouth: Mucous membranes are moist.     Pharynx: No oropharyngeal exudate or posterior oropharyngeal erythema.  Eyes:     Conjunctiva/sclera: Conjunctivae normal.     Pupils: Pupils are equal, round, and reactive to light.  Cardiovascular:     Rate and Rhythm: Normal rate.     Pulses: Normal pulses.     Heart sounds: No murmur heard.   Pulmonary:     Effort: No respiratory distress.     Breath sounds: No stridor. No wheezing, rhonchi or rales.  Chest:     Chest wall: No tenderness.  Abdominal:     General: Abdomen is flat. There is no distension.     Tenderness: There is no abdominal tenderness. There is no right CVA tenderness.  Musculoskeletal:        General: Tenderness present.     Cervical back: Normal range of motion and neck supple. No tenderness.  Skin:    General: Skin is warm.     Capillary Refill: Capillary refill takes less than 2 seconds.     Findings: No erythema or rash.  Neurological:     Mental Status: She is alert and oriented to person, place, and time.     Sensory: No sensory deficit.     Motor: No weakness or abnormal muscle tone.     Coordination: Coordination normal.     Gait: Gait normal.     Deep Tendon Reflexes: Reflexes are normal and symmetric.  Psychiatric:        Mood and Affect: Mood normal.     ED Results / Procedures / Treatments   Labs (all labs ordered are listed, but only abnormal results are displayed) Labs Reviewed  BASIC METABOLIC PANEL - Abnormal; Notable for the following components:      Result Value   Glucose, Bld 133 (*)    All other components within normal limits  CBC  CBG MONITORING, ED    EKG EKG Interpretation  Date/Time:  Wednesday April 22 2020 10:27:07 EDT Ventricular Rate:  71 PR  Interval:  156 QRS Duration: 70 QT Interval:  404 QTC Calculation: 439 R Axis:   20 Text Interpretation: Normal sinus rhythm Cannot rule out Anterior infarct , age undetermined Abnormal ECG When compared to prior, similar appearance. No STEMI Confirmed by Antony Blackbird [id number]) on 04/22/2020 11:00:20 AM   Radiology CT Head Wo Contrast  Result Date: 04/22/2020 CLINICAL DATA:  69 year old female with a history of minor trauma EXAM: CT HEAD WITHOUT CONTRAST CT CERVICAL SPINE WITHOUT CONTRAST TECHNIQUE: Multidetector CT imaging of the head and cervical spine was performed following the standard protocol without intravenous contrast. Multiplanar CT image reconstructions of the cervical spine were also generated. COMPARISON:  None. FINDINGS: CT HEAD FINDINGS Brain: No acute intracranial hemorrhage. No midline shift or mass effect. Gray-white differentiation maintained. Unremarkable appearance of the ventricular system. Vascular: Calcifications of the intracranial vasculature. Skull: Focal soft tissue swelling/hematoma in the right parietal scalp. No associated fracture. No aggressive bony lesions. Sinuses/Orbits: Unremarkable appearance of the orbits. Mastoid air cells clear. No middle ear effusion. No significant sinus disease. Other: Chronic deformity of the left nasal bone. CT CERVICAL SPINE FINDINGS Alignment: Straightening of the cervical spine. No subluxation. Facets are aligned. Skull base and vertebrae: No acute fracture at the skullbase. Vertebral body heights relatively maintained. No acute fracture identified. Soft tissues and spinal canal: Unremarkable cervical soft tissues. Lymph nodes are present, though not enlarged. Disc levels:  C2-C3 and C3-C4 demonstrate mild disc space narrowing. C3-C4 demonstrates posterior disc bulge/central herniation which may contact the ventral thecal sac. C4-C5: Posterior disc osteophyte complex contacts the ventral thecal sac and narrows the left canal and foramen.  C5-C6: Posterior disc osteophyte complex appears to contact the ventral thecal sac, asymmetric towards the left with narrowing of the left neural foramen. C6-C7: Posterior disc osteophyte complex with posterior disc bulge/herniation appears to contact the ventral thecal sac and narrows the left aspect of the canal. Associated uncovertebral joint disease contributes to left greater than right foraminal narrowing. Upper chest: Unremarkable appearance of the lung apices. Other: Carotid vascular calcifications and atherosclerosis of the aortic arch and branch vessels. IMPRESSION: Head CT: No acute intracranial abnormality. Focal hematoma in the right parietal scalp without associated fracture. Cervical CT: No acute fracture. Similar appearance of multilevel degenerative changes, worst spanning the C4-C7 levels, as above. Aortic Atherosclerosis (ICD10-I70.0). Associated atherosclerotic calcifications of the branch vessels and carotid arteries. Electronically Signed   By: Corrie Mckusick D.O.   On: 04/22/2020 11:59   CT Cervical Spine Wo Contrast  Result Date: 04/22/2020 CLINICAL DATA:  69 year old female with a history of minor trauma EXAM: CT HEAD WITHOUT CONTRAST CT CERVICAL SPINE WITHOUT CONTRAST TECHNIQUE: Multidetector CT imaging of the head and cervical spine was performed following the standard protocol without intravenous contrast. Multiplanar CT image reconstructions of the cervical spine were also generated. COMPARISON:  None. FINDINGS: CT HEAD FINDINGS Brain: No acute intracranial hemorrhage. No midline shift or mass effect. Gray-white differentiation maintained. Unremarkable appearance of the ventricular system. Vascular: Calcifications of the intracranial vasculature. Skull: Focal soft tissue swelling/hematoma in the right parietal scalp. No associated fracture. No aggressive bony lesions. Sinuses/Orbits: Unremarkable appearance of the orbits. Mastoid air cells clear. No middle ear effusion. No significant  sinus disease. Other: Chronic deformity of the left nasal bone. CT CERVICAL SPINE FINDINGS Alignment: Straightening of the cervical spine. No subluxation. Facets are aligned. Skull base and vertebrae: No acute fracture at the skullbase. Vertebral body heights relatively maintained. No acute fracture identified. Soft tissues and spinal canal: Unremarkable cervical soft tissues. Lymph nodes are present, though  not enlarged. Disc levels:  C2-C3 and C3-C4 demonstrate mild disc space narrowing. C3-C4 demonstrates posterior disc bulge/central herniation which may contact the ventral thecal sac. C4-C5: Posterior disc osteophyte complex contacts the ventral thecal sac and narrows the left canal and foramen. C5-C6: Posterior disc osteophyte complex appears to contact the ventral thecal sac, asymmetric towards the left with narrowing of the left neural foramen. C6-C7: Posterior disc osteophyte complex with posterior disc bulge/herniation appears to contact the ventral thecal sac and narrows the left aspect of the canal. Associated uncovertebral joint disease contributes to left greater than right foraminal narrowing. Upper chest: Unremarkable appearance of the lung apices. Other: Carotid vascular calcifications and atherosclerosis of the aortic arch and branch vessels. IMPRESSION: Head CT: No acute intracranial abnormality. Focal hematoma in the right parietal scalp without associated fracture. Cervical CT: No acute fracture. Similar appearance of multilevel degenerative changes, worst spanning the C4-C7 levels, as above. Aortic Atherosclerosis (ICD10-I70.0). Associated atherosclerotic calcifications of the branch vessels and carotid arteries. Electronically Signed   By: Corrie Mckusick D.O.   On: 04/22/2020 11:59    Procedures .Marland KitchenLaceration Repair  Date/Time: 04/22/2020 1:55 PM Performed by: Courtney Paris, MD Authorized by: Courtney Paris, MD   Consent:    Consent obtained:  Verbal   Consent given  by:  Patient   Risks discussed:  Pain, poor cosmetic result and infection   Alternatives discussed:  No treatment Anesthesia (see MAR for exact dosages):    Anesthesia method:  Local infiltration   Local anesthetic:  Lidocaine 2% WITH epi Laceration details:    Location:  Scalp   Scalp location:  R parietal   Length (cm):  4   Depth (mm):  1 Repair type:    Repair type:  Simple Pre-procedure details:    Preparation:  Patient was prepped and draped in usual sterile fashion and imaging obtained to evaluate for foreign bodies Exploration:    Wound exploration: wound explored through full range of motion and entire depth of wound probed and visualized     Wound extent: no muscle damage noted     Contaminated: no   Treatment:    Area cleansed with:  Saline   Amount of cleaning:  Standard   Irrigation solution:  Sterile saline   Irrigation method:  Syringe   Visualized foreign bodies/material removed: yes   Skin repair:    Repair method:  Staples   Number of staples:  7 Approximation:    Approximation:  Close Post-procedure details:    Dressing:  Antibiotic ointment   Patient tolerance of procedure:  Tolerated well, no immediate complications   (including critical care time)  Medications Ordered in ED Medications  lidocaine-EPINEPHrine (XYLOCAINE W/EPI) 1 %-1:100000 (with pres) injection 10 mL (10 mLs Intradermal Given 04/22/20 1305)    ED Course  I have reviewed the triage vital signs and the nursing notes.  Pertinent labs & imaging results that were available during my care of the patient were reviewed by me and considered in my medical decision making (see chart for details).    MDM Rules/Calculators/A&P                          Azyiah Bo is a 69 y.o. female with a past medical history significant for GERD, COPD with chronic cough, hypertension, herbal bowel syndrome, hypercholesterolemia, asthma, and depression who presents with head injury after falling out of bed.   She reports that earlier morning around 3  AM, she rolled out of bed hitting the right side of her head on a table.  She denied loss of consciousness but had immediate onset of bleeding and pain.  She reports he did not want to come to the emergency department so she wrapped it up with some towels.  She reports she is still having some headache as well as neck pain.  She describes as moderate.  She says that she "lost a lot of blood" and was feeling lightheaded this morning.  She denies any syncopal episodes.  She denies any chest pain or palpitations.  She reports chronic cough with no changes with breathing.  No lower back or extremity pains.  Denies any vision changes, nausea, or vomiting.  She reports her tetanus is up-to-date.  On exam, patient is in c-collar.  She has some tenderness on the right side of her head.  There is some matted blood partially obstructed by the c-collar.  It is not actively bleeding.  Lungs clear and chest is nontender.  Abdomen is nontender.  She moving all extremities.  Pupils are symmetric and reactive normal extract movements.  Oropharyngeal exam otherwise unremarkable.  We will get CT head and neck and then hopefully remove the collar to washout and examine the wound and treat the wound as needed.  Tetanus is reportedly up-to-date.  Anticipate reassessment after imaging.  She will also get screening labs given the reported lightheadedness after losing so much blood overnight.  Patient's labs showed no new anemia.  Other labs reassuring.  CT head showed no fracture of the skull or intracranial bleeding.  Patient was reassessed and her collar was removed.  No evidence of C-spine injury.  She has known degenerative changes.  Wound was cleaned and she has a 3 similar laceration to her right scalp.  It was anesthetized and stapled with 7 staples.  It was covered in bacitracin and she will be discharged to follow-up with PCP.  Patient agrees and will watch for signs of  infection.  She reports her tetanus is up-to-date.  She no questions or concerns and was discharged in good condition after reassuring work-up.   Final Clinical Impression(s) / ED Diagnoses Final diagnoses:  Laceration of head without foreign body, unspecified part of head, initial encounter  Injury of head, initial encounter    Rx / DC Orders ED Discharge Orders    None      Clinical Impression: 1. Laceration of head without foreign body, unspecified part of head, initial encounter   2. Injury of head, initial encounter     Disposition: Discharge  Condition: Good  I have discussed the results, Dx and Tx plan with the pt(& family if present). He/she/they expressed understanding and agree(s) with the plan. Discharge instructions discussed at great length. Strict return precautions discussed and pt &/or family have verbalized understanding of the instructions. No further questions at time of discharge.    Discharge Medication List as of 04/22/2020  1:09 PM      Follow Up: Marrian Salvage, [place] [id number] [place] [id number] [phone] Schedule an appointment as soon as possible for a visit       Al Bracewell, Gwenyth Allegra, MD 04/22/20 1358

## 2020-04-22 NOTE — ED Notes (Signed)
Pt stated that she did not think she was a diabetic.Pt A &O x 4

## 2020-04-22 NOTE — Discharge Instructions (Signed)
Your imaging today did not show evidence of skull fracture or intracranial bleeding.  You did have a hematoma which were able to clean up and clean out.  We were able to manage wound with staples successfully.  Please watch for signs and symptoms of infections and follow-up with your primary doctor or urgent care or this department in 7 to 10 days for staple removal.  Please rest and stay hydrated and be careful while getting out of bed.  If any symptoms change or worsen, please return to the nearest emergency department.

## 2020-05-01 ENCOUNTER — Ambulatory Visit (INDEPENDENT_AMBULATORY_CARE_PROVIDER_SITE_OTHER): Payer: Medicare Other | Admitting: Family

## 2020-05-01 ENCOUNTER — Other Ambulatory Visit: Payer: Self-pay

## 2020-05-01 ENCOUNTER — Encounter: Payer: Self-pay | Admitting: Family

## 2020-05-01 VITALS — BP 150/80 | HR 68 | Temp 97.9°F | Ht 62.0 in | Wt 189.0 lb

## 2020-05-01 DIAGNOSIS — R7309 Other abnormal glucose: Secondary | ICD-10-CM | POA: Diagnosis not present

## 2020-05-01 DIAGNOSIS — Z23 Encounter for immunization: Secondary | ICD-10-CM | POA: Diagnosis not present

## 2020-05-01 DIAGNOSIS — R296 Repeated falls: Secondary | ICD-10-CM | POA: Diagnosis not present

## 2020-05-01 DIAGNOSIS — F419 Anxiety disorder, unspecified: Secondary | ICD-10-CM

## 2020-05-01 DIAGNOSIS — M7989 Other specified soft tissue disorders: Secondary | ICD-10-CM

## 2020-05-01 LAB — COMPREHENSIVE METABOLIC PANEL
ALT: 20 U/L (ref 0–35)
AST: 25 U/L (ref 0–37)
Albumin: 4.1 g/dL (ref 3.5–5.2)
Alkaline Phosphatase: 68 U/L (ref 39–117)
BUN: 17 mg/dL (ref 6–23)
CO2: 29 mEq/L (ref 19–32)
Calcium: 8.9 mg/dL (ref 8.4–10.5)
Chloride: 108 mEq/L (ref 96–112)
Creatinine, Ser: 0.87 mg/dL (ref 0.40–1.20)
GFR: 67.29 mL/min (ref >60.00)
Glucose, Bld: 120 mg/dL — ABNORMAL HIGH (ref 70–99)
Potassium: 4.2 mEq/L (ref 3.5–5.1)
Sodium: 143 mEq/L (ref 135–145)
Total Bilirubin: 0.4 mg/dL (ref 0.2–1.2)
Total Protein: 6.5 g/dL (ref 6.0–8.3)

## 2020-05-01 LAB — HEMOGLOBIN A1C: Hgb A1c MFr Bld: 6.6 % — ABNORMAL HIGH (ref 4.6–6.5)

## 2020-05-01 NOTE — Progress Notes (Signed)
Tiffany Murphy is a 69 y.o. female with the following history as recorded in EpicCare:  Patient Active Problem List   Diagnosis Date Noted  . Subjective memory complaints 02/21/2019  . Frequency of urination- pt thinks due to HCTZ BP med 02/21/2019  . Hyperlipidemia with target LDL less than 70 11/13/2018  . Closed fracture of left distal radius 10/25/2018  . Tobacco abuse counseling 07/12/2018  . Sleep difficulties- poor sleep hygeine 07/12/2018  . Chronic pain disorder 07/12/2018  . Chronic joint pain 07/12/2018  . OSA and COPD overlap syndrome ([place]) 04/30/2018  . Chronic fatigue 04/30/2018  . Lung nodule seen on imaging study 04/30/2018  . Vitamin D deficiency 04/30/2018  . Tobacco use disorder 04/11/2018  . Elevated LDL cholesterol level 04/11/2018  . Chronic obstructive pulmonary disease ([place]) 04/11/2018  . GERD (gastroesophageal reflux disease) 04/11/2018  . Asthma 04/11/2018  . Obesity, Class I, BMI 30-34.9 04/11/2018  . Glucose intolerance (impaired glucose tolerance) 04/11/2018  . Irritable bowel syndrome 04/11/2018  . Chronic bilateral upper abdominal pain 04/11/2018  . Panic attacks 04/11/2018  . Mood disorder ([place]) 04/11/2018  . Hypokalemia 04/11/2018  . Environmental and seasonal allergies 04/11/2018  . Elevated coronary artery calcium score 12/01/2014  . Benign essential HTN 07/11/2013  . Depression with anxiety 07/11/2013  . Pure hypercholesterolemia 07/11/2013  . Nicotine addiction 06/15/2013  . Overweight 06/15/2013    Current Outpatient Medications  Medication Sig Dispense Refill  . acyclovir (ZOVIRAX) 200 MG capsule Take 1 capsule (200 mg total) by mouth daily. 90 capsule 1  . albuterol (PROVENTIL HFA;VENTOLIN HFA) 108 (90 Base) MCG/ACT inhaler Inhale 2 puffs into the lungs every 6 (six) hours as needed for wheezing. 1 Inhaler 2  . b complex vitamins capsule Take 1 capsule by mouth daily.    . Cholecalciferol (VITAMIN D) 125 MCG (5000 UT) CAPS Take 5,000  Units by mouth once a week.    . Cyanocobalamin (VITAMIN B 12 PO) Take 1 tablet by mouth daily.    . fluticasone (FLONASE) 50 MCG/ACT nasal spray USE 2 SPRAYS IN EACH NOSTRIL EVERY DAY (Patient taking differently: Place 2 sprays into both nostrils daily. ) 32 g 0  . Fluticasone-Umeclidin-Vilant (TRELEGY ELLIPTA) 100-62.5-25 MCG/INH AEPB Inhale 1 puff into the lungs daily. 90 each 5  . gabapentin (NEURONTIN) 300 MG capsule Take 1 capsule (300 mg total) by mouth See admin instructions. Take 1 capsule in the morning, 1 capsule in the afternoon and 2 capsule at bedtime. 360 capsule 0  . LORazepam (ATIVAN) 0.5 MG tablet Take 1 tablet (0.5 mg total) by mouth 2 (two) times daily as needed for anxiety. 30 tablet 1  . losartan (COZAAR) 100 MG tablet TAKE 1 TABLET BY MOUTH ONCE DAILY . (Patient taking differently: Take 100 mg by mouth daily. ) 90 tablet 0  . montelukast (SINGULAIR) 10 MG tablet Take 1 tablet (10 mg total) by mouth at bedtime. 90 tablet 1  . Multiple Vitamin (MULTIVITAMIN WITH MINERALS) TABS tablet Take 1 tablet by mouth daily.    . nitrofurantoin, macrocrystal-monohydrate, (MACROBID) 100 MG capsule     . Omega-3 Fatty Acids (FISH OIL) 1000 MG CPDR Take 1,000 mg by mouth daily.    Marland Kitchen omeprazole (PRILOSEC) 40 MG capsule Take 1 capsule (40 mg total) by mouth daily. 90 capsule 3  . rosuvastatin (CRESTOR) 40 MG tablet Take 0.5 tablets (20 mg total) by mouth at bedtime. 45 tablet 0  . sertraline (ZOLOFT) 100 MG tablet Take 2 tablets (200 mg  total) by mouth daily. (Patient taking differently: Take 100 mg by mouth in the morning and at bedtime. ) 180 tablet 0   No current facility-administered medications for this visit.    Allergies: Penicillins, Lisinopril, Lovastatin, and Other  Past Medical History:  Diagnosis Date  . Anxiety   . Asthma   . Bronchitis   . Closed fracture of left distal radius 10/25/2018  . COPD (chronic obstructive pulmonary disease) ([place])   . Depression   . GERD  (gastroesophageal reflux disease)   . Headache   . Hypercholesteremia   . Hypertension   . IBS (irritable bowel syndrome)   . Sleep apnea   . Tobacco abuse     Past Surgical History:  Procedure Laterality Date  . APPENDECTOMY    . COLONOSCOPY  08/26/2015   Colonic polyps status post polypectomy. Minimal sigmoid diverticulosis.   Marland Kitchen ESOPHAGOGASTRODUODENOSCOPY  02/25/2011   Large hiatal hernia otherwise normal EGD.  Marland Kitchen OPEN REDUCTION INTERNAL FIXATION (ORIF) DISTAL RADIAL FRACTURE Left 10/25/2018   Procedure: OPEN REDUCTION INTERNAL FIXATION (ORIF)LEFT  DISTAL RADIAL FRACTURE;  Surgeon: Marchia Bond, MD;  Location: [facility];  Service: Orthopedics;  Laterality: Left;  Marland Kitchen VAGINAL HYSTERECTOMY      Family History  Problem Relation Age of Onset  . Alzheimer's disease Father 41  . Diabetes Father   . CAD Mother 99  . Colon cancer Neg Hx   . Esophageal cancer Neg Hx     Social History   Tobacco Use  . Smoking status: Former Smoker    Packs/day: 1.00    Years: 44.00    Pack years: 44.00    Types: Cigarettes    Quit date: 12/19/2019    Years since quitting: 0.3  . Smokeless tobacco: Never Used  . Tobacco comment: in process of quitting  Substance Use Topics  . Alcohol use: No    Alcohol/week: 0.0 standard drinks    Subjective:   Patient was seen at the ER on 11/3 after falling out of her bed and hitting the side of her head on bedside table; needs to have 7 staples removed from right side of scalp; no concerns for healing; Has had history of recurrent falls- was seen by neurology earlier this year but only tremors were discussed; patient did not mention the 2 year history of falls; She mentions her anxiety again- she has been referred to psychiatrist multiple times; she is not sure why she has never followed up; she is on Zoloft 200 mg daily/ does not feel that Ativan is helpful; has used Xanax in the past- notes she took herself off that medication due to concerns  for addiction; She also asks again about referral to rheumatology due to pain/ swelling in her hands- notes that hands are so swollen in the mornings, she can hardly open them;   Would like flu and pneumonia shot today;   Objective:  Vitals:   05/01/20 0912  BP: (!) 150/80  Pulse: 68  Temp: 97.9 F (36.6 C)  TempSrc: Oral  SpO2: 96%  Weight: 189 lb (85.7 kg)  Height: '5\' 2"'  (1.575 m)    General: Well developed, well nourished, in no acute distress  Skin : Warm and dry. Scabbed, healing area noted on right side of scalp where sutures were removed; Head: Normocephalic and atraumatic  Eyes: Sclera and conjunctiva clear; pupils round and reactive to light; extraocular movements intact  Ears: External normal; canals clear; tympanic membranes normal  Oropharynx: Pink, supple. No  suspicious lesions  Neck: Supple without thyromegaly, adenopathy  Lungs: Respirations unlabored;Musculoskeletal: No deformities; no active joint inflammation  Extremities: No edema, cyanosis, clubbing  Vessels: Symmetric bilaterally  Neurologic: Alert and oriented; speech intact; face symmetrical; moves all extremities well; CNII-XII intact without focal deficit   Assessment:  1. Recurrent falls   2. Swelling of both hands   3. Anxiety   4. Elevated glucose   5. Needs flu shot   6. Need for pneumococcal vaccine     Plan:  1. Refer back to her neurologist; 2. Refer to rheumatologist; 3. Would appreciate psychiatric consult; patient agrees to follow up this time; will not prescribe Xanax for her; continue Sertraline 200 mg daily;  4. CMP, Hgba1c checked today; Flu and Pneumovax given;   Time spent 30 minutes  No follow-ups on file.  Orders Placed This Encounter  Procedures  . Flu Vaccine QUAD High Dose(Fluad)  . Pneumococcal polysaccharide vaccine 23-valent greater than or equal to 2yo subcutaneous/IM  . Comp Met (CMET)    Standing Status:   Future    Number of Occurrences:   1    Standing  Expiration Date:   05/01/2021  . Hemoglobin A1c    Standing Status:   Future    Number of Occurrences:   1    Standing Expiration Date:   05/01/2021  . Ambulatory referral to Neurology    Referral Priority:   Routine    Referral Type:   Consultation    Referral Reason:   Specialty Services Required    Referred to Provider:   Star Age, MD    Requested Specialty:   Neurology    Number of Visits Requested:   1  . Ambulatory referral to Rheumatology    Referral Priority:   Routine    Referral Type:   Consultation    Referral Reason:   Specialty Services Required    Requested Specialty:   Rheumatology    Number of Visits Requested:   1  . Ambulatory referral to Psychiatry    Referral Priority:   Routine    Referral Type:   Psychiatric    Referral Reason:   Specialty Services Required    Requested Specialty:   Psychiatry    Number of Visits Requested:   1    Requested Prescriptions    No prescriptions requested or ordered in this encounter

## 2020-05-11 ENCOUNTER — Other Ambulatory Visit: Payer: Self-pay | Admitting: Family

## 2020-05-11 DIAGNOSIS — G8929 Other chronic pain: Secondary | ICD-10-CM

## 2020-05-11 DIAGNOSIS — G894 Chronic pain syndrome: Secondary | ICD-10-CM

## 2020-05-11 DIAGNOSIS — M2550 Pain in unspecified joint: Secondary | ICD-10-CM

## 2020-06-01 ENCOUNTER — Institutional Professional Consult (permissible substitution): Payer: Medicare Other | Admitting: Neurology

## 2020-06-08 ENCOUNTER — Telehealth: Payer: Self-pay | Admitting: Family

## 2020-06-08 ENCOUNTER — Other Ambulatory Visit: Payer: Self-pay | Admitting: Family

## 2020-06-08 DIAGNOSIS — I10 Essential (primary) hypertension: Secondary | ICD-10-CM

## 2020-06-08 MED ORDER — LOSARTAN POTASSIUM 100 MG PO TABS: ORAL_TABLET | ORAL | 1 refills | Status: DC

## 2020-06-08 MED ORDER — SERTRALINE HCL 100 MG PO TABS: 200.0000 mg | ORAL_TABLET | Freq: Every day | ORAL | 1 refills | Status: DC

## 2020-06-08 NOTE — Telephone Encounter (Signed)
losartan (COZAAR) 100 MG tablet sertraline (ZOLOFT) 100 MG tablet Patient states she needs these medications refilled, new patient of Mickel Baas so it would be the first time she would be filling these for her.  [facility] ([address]), [place] - [address] Phone:  [phone]  Fax:  [phone]

## 2020-06-09 ENCOUNTER — Other Ambulatory Visit: Payer: Self-pay | Admitting: Family

## 2020-06-09 DIAGNOSIS — I10 Essential (primary) hypertension: Secondary | ICD-10-CM

## 2020-06-09 MED ORDER — LOSARTAN POTASSIUM 50 MG PO TABS: 50.0000 mg | ORAL_TABLET | Freq: Two times a day (BID) | ORAL | 0 refills | Status: DC

## 2020-06-20 ENCOUNTER — Other Ambulatory Visit: Payer: Self-pay | Admitting: Family

## 2020-06-20 DIAGNOSIS — F39 Unspecified mood [affective] disorder: Secondary | ICD-10-CM

## 2020-07-07 ENCOUNTER — Telehealth: Payer: Self-pay | Admitting: Family

## 2020-07-07 ENCOUNTER — Telehealth (INDEPENDENT_AMBULATORY_CARE_PROVIDER_SITE_OTHER): Payer: Medicare Other | Admitting: Family

## 2020-07-07 DIAGNOSIS — J209 Acute bronchitis, unspecified: Secondary | ICD-10-CM | POA: Diagnosis not present

## 2020-07-07 MED ORDER — IBUPROFEN 800 MG PO TABS: 800.0000 mg | ORAL_TABLET | Freq: Three times a day (TID) | ORAL | 0 refills | Status: DC | PRN

## 2020-07-07 MED ORDER — DOXYCYCLINE HYCLATE 100 MG PO TABS: 100.0000 mg | ORAL_TABLET | Freq: Two times a day (BID) | ORAL | 0 refills | Status: DC

## 2020-07-07 MED ORDER — ALBUTEROL SULFATE HFA 108 (90 BASE) MCG/ACT IN AERS: 2.0000 | INHALATION_SPRAY | Freq: Four times a day (QID) | RESPIRATORY_TRACT | 2 refills | Status: DC | PRN

## 2020-07-07 NOTE — Telephone Encounter (Signed)
LVM for pt to rtn my call to schedule awv with nha. Please schedule appt if pt calls the office.  

## 2020-07-07 NOTE — Progress Notes (Signed)
Elisea Khader is a 70 y.o. female with the following history as recorded in EpicCare:  Patient Active Problem List   Diagnosis Date Noted  . Subjective memory complaints 02/21/2019  . Frequency of urination- pt thinks due to HCTZ BP med 02/21/2019  . Hyperlipidemia with target LDL less than 70 11/13/2018  . Closed fracture of left distal radius 10/25/2018  . Tobacco abuse counseling 07/12/2018  . Sleep difficulties- poor sleep hygeine 07/12/2018  . Chronic pain disorder 07/12/2018  . Chronic joint pain 07/12/2018  . OSA and COPD overlap syndrome ([place]) 04/30/2018  . Chronic fatigue 04/30/2018  . Lung nodule seen on imaging study 04/30/2018  . Vitamin D deficiency 04/30/2018  . Tobacco use disorder 04/11/2018  . Elevated LDL cholesterol level 04/11/2018  . Chronic obstructive pulmonary disease ([place]) 04/11/2018  . GERD (gastroesophageal reflux disease) 04/11/2018  . Asthma 04/11/2018  . Obesity, Class I, BMI 30-34.9 04/11/2018  . Glucose intolerance (impaired glucose tolerance) 04/11/2018  . Irritable bowel syndrome 04/11/2018  . Chronic bilateral upper abdominal pain 04/11/2018  . Panic attacks 04/11/2018  . Mood disorder ([place]) 04/11/2018  . Hypokalemia 04/11/2018  . Environmental and seasonal allergies 04/11/2018  . Elevated coronary artery calcium score 12/01/2014  . Benign essential HTN 07/11/2013  . Depression with anxiety 07/11/2013  . Pure hypercholesterolemia 07/11/2013  . Nicotine addiction 06/15/2013  . Overweight 06/15/2013    Current Outpatient Medications  Medication Sig Dispense Refill  . doxycycline (VIBRA-TABS) 100 MG tablet Take 1 tablet (100 mg total) by mouth 2 (two) times daily. 20 tablet 0  . gabapentin (NEURONTIN) 300 MG capsule TAKE ONE CAPSULE BY MOUTH IN THE MORNING, 1 CAPSULES IN THE AFTERNOON, AND 2 CAPSULES AT BEDTIME 360 capsule 0  . ibuprofen (ADVIL) 800 MG tablet Take 1 tablet (800 mg total) by mouth every 8 (eight) hours as needed. 30 tablet 0  .  acyclovir (ZOVIRAX) 200 MG capsule Take 1 capsule (200 mg total) by mouth daily. 90 capsule 1  . albuterol (VENTOLIN HFA) 108 (90 Base) MCG/ACT inhaler Inhale 2 puffs into the lungs every 6 (six) hours as needed for wheezing. 1 each 2  . b complex vitamins capsule Take 1 capsule by mouth daily.    . Cholecalciferol (VITAMIN D) 125 MCG (5000 UT) CAPS Take 5,000 Units by mouth once a week.    . Cyanocobalamin (VITAMIN B 12 PO) Take 1 tablet by mouth daily.    . fluticasone (FLONASE) 50 MCG/ACT nasal spray USE 2 SPRAYS IN EACH NOSTRIL EVERY DAY (Patient taking differently: Place 2 sprays into both nostrils daily. ) 32 g 0  . Fluticasone-Umeclidin-Vilant (TRELEGY ELLIPTA) 100-62.5-25 MCG/INH AEPB Inhale 1 puff into the lungs daily. 90 each 5  . LORazepam (ATIVAN) 0.5 MG tablet Take 1 tablet by mouth twice daily as needed for anxiety 30 tablet 0  . losartan (COZAAR) 50 MG tablet Take 1 tablet (50 mg total) by mouth 2 (two) times daily. 180 tablet 0  . montelukast (SINGULAIR) 10 MG tablet Take 1 tablet (10 mg total) by mouth at bedtime. 90 tablet 1  . Multiple Vitamin (MULTIVITAMIN WITH MINERALS) TABS tablet Take 1 tablet by mouth daily.    . nitrofurantoin, macrocrystal-monohydrate, (MACROBID) 100 MG capsule     . Omega-3 Fatty Acids (FISH OIL) 1000 MG CPDR Take 1,000 mg by mouth daily.    Marland Kitchen omeprazole (PRILOSEC) 40 MG capsule Take 1 capsule (40 mg total) by mouth daily. 90 capsule 3  . rosuvastatin (CRESTOR) 40 MG tablet  Take 0.5 tablets (20 mg total) by mouth at bedtime. 45 tablet 0  . sertraline (ZOLOFT) 100 MG tablet Take 2 tablets (200 mg total) by mouth daily. 180 tablet 1   No current facility-administered medications for this visit.    Allergies: Penicillins, Lisinopril, Lovastatin, and Other  Past Medical History:  Diagnosis Date  . Anxiety   . Asthma   . Bronchitis   . Closed fracture of left distal radius 10/25/2018  . COPD (chronic obstructive pulmonary disease) ([place])   . Depression    . GERD (gastroesophageal reflux disease)   . Headache   . Hypercholesteremia   . Hypertension   . IBS (irritable bowel syndrome)   . Sleep apnea   . Tobacco abuse     Past Surgical History:  Procedure Laterality Date  . APPENDECTOMY    . COLONOSCOPY  08/26/2015   Colonic polyps status post polypectomy. Minimal sigmoid diverticulosis.   Marland Kitchen ESOPHAGOGASTRODUODENOSCOPY  02/25/2011   Large hiatal hernia otherwise normal EGD.  Marland Kitchen OPEN REDUCTION INTERNAL FIXATION (ORIF) DISTAL RADIAL FRACTURE Left 10/25/2018   Procedure: OPEN REDUCTION INTERNAL FIXATION (ORIF)LEFT  DISTAL RADIAL FRACTURE;  Surgeon: Marchia Bond, MD;  Location: [facility];  Service: Orthopedics;  Laterality: Left;  Marland Kitchen VAGINAL HYSTERECTOMY      Family History  Problem Relation Age of Onset  . Alzheimer's disease Father 81  . Diabetes Father   . CAD Mother 59  . Colon cancer Neg Hx   . Esophageal cancer Neg Hx     Social History   Tobacco Use  . Smoking status: Former Smoker    Packs/day: 1.00    Years: 44.00    Pack years: 44.00    Types: Cigarettes    Quit date: 12/19/2019    Years since quitting: 0.5  . Smokeless tobacco: Never Used  . Tobacco comment: in process of quitting  Substance Use Topics  . Alcohol use: No    Alcohol/week: 0.0 standard drinks    Subjective:   I connected with Jonae Peifer on 07/07/20 at 12:20 PM EST by a telephone call and verified that I am speaking with the correct person using two identifiers.   I discussed the limitations of evaluation and management by telemedicine and the availability of in person appointments. The patient expressed understanding and agreed to proceed. Provider in office/ patient is at home; provider and patient are only 2 people on telephone call.   1 week history of ear pain/ worsening chest congestion; requesting refill on her albuterol; no chest pain or shortness of breath; unable to easily leave her home due to weather conditions at this time;       Objective:  There were no vitals filed for this visit.   Lungs: Respirations unlabored;  Neurologic: Alert and oriented; speech intact;   Assessment:  1. Acute bronchitis, unspecified organism     Plan:  Rx for Doxycycline, Albuterol and ventolin HFA; increase fluids, rest and follow up worse, no better.  Time spent 9 minutes  No follow-ups on file.  No orders of the defined types were placed in this encounter.   Requested Prescriptions   Signed Prescriptions Disp Refills  . albuterol (VENTOLIN HFA) 108 (90 Base) MCG/ACT inhaler 1 each 2    Sig: Inhale 2 puffs into the lungs every 6 (six) hours as needed for wheezing.  Marland Kitchen ibuprofen (ADVIL) 800 MG tablet 30 tablet 0    Sig: Take 1 tablet (800 mg total) by mouth every 8 (eight)  hours as needed.  . doxycycline (VIBRA-TABS) 100 MG tablet 20 tablet 0    Sig: Take 1 tablet (100 mg total) by mouth 2 (two) times daily.

## 2020-07-10 ENCOUNTER — Ambulatory Visit: Payer: Medicare Other | Admitting: Internal Medicine

## 2020-08-04 ENCOUNTER — Telehealth (INDEPENDENT_AMBULATORY_CARE_PROVIDER_SITE_OTHER): Payer: Medicare Other | Admitting: Family

## 2020-08-04 ENCOUNTER — Other Ambulatory Visit: Payer: Self-pay | Admitting: Family

## 2020-08-04 DIAGNOSIS — R059 Cough, unspecified: Secondary | ICD-10-CM | POA: Diagnosis not present

## 2020-08-04 NOTE — Progress Notes (Signed)
Tiffany Murphy is a 70 y.o. female with the following history as recorded in EpicCare:  Patient Active Problem List   Diagnosis Date Noted  . Subjective memory complaints 02/21/2019  . Frequency of urination- pt thinks due to HCTZ BP med 02/21/2019  . Hyperlipidemia with target LDL less than 70 11/13/2018  . Closed fracture of left distal radius 10/25/2018  . Tobacco abuse counseling 07/12/2018  . Sleep difficulties- poor sleep hygeine 07/12/2018  . Chronic pain disorder 07/12/2018  . Chronic joint pain 07/12/2018  . OSA and COPD overlap syndrome ([place]) 04/30/2018  . Chronic fatigue 04/30/2018  . Lung nodule seen on imaging study 04/30/2018  . Vitamin D deficiency 04/30/2018  . Tobacco use disorder 04/11/2018  . Elevated LDL cholesterol level 04/11/2018  . Chronic obstructive pulmonary disease ([place]) 04/11/2018  . GERD (gastroesophageal reflux disease) 04/11/2018  . Asthma 04/11/2018  . Obesity, Class I, BMI 30-34.9 04/11/2018  . Glucose intolerance (impaired glucose tolerance) 04/11/2018  . Irritable bowel syndrome 04/11/2018  . Chronic bilateral upper abdominal pain 04/11/2018  . Panic attacks 04/11/2018  . Mood disorder ([place]) 04/11/2018  . Hypokalemia 04/11/2018  . Environmental and seasonal allergies 04/11/2018  . Elevated coronary artery calcium score 12/01/2014  . Benign essential HTN 07/11/2013  . Depression with anxiety 07/11/2013  . Pure hypercholesterolemia 07/11/2013  . Nicotine addiction 06/15/2013  . Overweight 06/15/2013    Current Outpatient Medications  Medication Sig Dispense Refill  . gabapentin (NEURONTIN) 300 MG capsule TAKE ONE CAPSULE BY MOUTH IN THE MORNING, 1 CAPSULES IN THE AFTERNOON, AND 2 CAPSULES AT BEDTIME 360 capsule 0  . acyclovir (ZOVIRAX) 200 MG capsule Take 1 capsule (200 mg total) by mouth daily. 90 capsule 1  . albuterol (VENTOLIN HFA) 108 (90 Base) MCG/ACT inhaler Inhale 2 puffs into the lungs every 6 (six) hours as needed for wheezing. 1 each  2  . b complex vitamins capsule Take 1 capsule by mouth daily.    . Cholecalciferol (VITAMIN D) 125 MCG (5000 UT) CAPS Take 5,000 Units by mouth once a week.    . Cyanocobalamin (VITAMIN B 12 PO) Take 1 tablet by mouth daily.    Marland Kitchen doxycycline (VIBRA-TABS) 100 MG tablet Take 1 tablet (100 mg total) by mouth 2 (two) times daily. 20 tablet 0  . fluticasone (FLONASE) 50 MCG/ACT nasal spray USE 2 SPRAYS IN EACH NOSTRIL EVERY DAY (Patient taking differently: Place 2 sprays into both nostrils daily. ) 32 g 0  . Fluticasone-Umeclidin-Vilant (TRELEGY ELLIPTA) 100-62.5-25 MCG/INH AEPB Inhale 1 puff into the lungs daily. 90 each 5  . ibuprofen (ADVIL) 800 MG tablet Take 1 tablet (800 mg total) by mouth every 8 (eight) hours as needed. 30 tablet 0  . LORazepam (ATIVAN) 0.5 MG tablet Take 1 tablet by mouth twice daily as needed for anxiety 30 tablet 0  . losartan (COZAAR) 50 MG tablet Take 1 tablet (50 mg total) by mouth 2 (two) times daily. 180 tablet 0  . montelukast (SINGULAIR) 10 MG tablet Take 1 tablet (10 mg total) by mouth at bedtime. 90 tablet 1  . Multiple Vitamin (MULTIVITAMIN WITH MINERALS) TABS tablet Take 1 tablet by mouth daily.    . nitrofurantoin, macrocrystal-monohydrate, (MACROBID) 100 MG capsule     . Omega-3 Fatty Acids (FISH OIL) 1000 MG CPDR Take 1,000 mg by mouth daily.    Marland Kitchen omeprazole (PRILOSEC) 40 MG capsule Take 1 capsule (40 mg total) by mouth daily. 90 capsule 3  . rosuvastatin (CRESTOR) 40 MG tablet  Take 0.5 tablets (20 mg total) by mouth at bedtime. 45 tablet 0  . sertraline (ZOLOFT) 100 MG tablet Take 2 tablets (200 mg total) by mouth daily. 180 tablet 1   No current facility-administered medications for this visit.    Allergies: Penicillins, Lisinopril, Lovastatin, and Other  Past Medical History:  Diagnosis Date  . Anxiety   . Asthma   . Bronchitis   . Closed fracture of left distal radius 10/25/2018  . COPD (chronic obstructive pulmonary disease) ([place])   . Depression    . GERD (gastroesophageal reflux disease)   . Headache   . Hypercholesteremia   . Hypertension   . IBS (irritable bowel syndrome)   . Sleep apnea   . Tobacco abuse     Past Surgical History:  Procedure Laterality Date  . APPENDECTOMY    . COLONOSCOPY  08/26/2015   Colonic polyps status post polypectomy. Minimal sigmoid diverticulosis.   Marland Kitchen ESOPHAGOGASTRODUODENOSCOPY  02/25/2011   Large hiatal hernia otherwise normal EGD.  Marland Kitchen OPEN REDUCTION INTERNAL FIXATION (ORIF) DISTAL RADIAL FRACTURE Left 10/25/2018   Procedure: OPEN REDUCTION INTERNAL FIXATION (ORIF)LEFT  DISTAL RADIAL FRACTURE;  Surgeon: Marchia Bond, MD;  Location: [facility];  Service: Orthopedics;  Laterality: Left;  Marland Kitchen VAGINAL HYSTERECTOMY      Family History  Problem Relation Age of Onset  . Alzheimer's disease Father 4  . Diabetes Father   . CAD Mother 105  . Colon cancer Neg Hx   . Esophageal cancer Neg Hx     Social History   Tobacco Use  . Smoking status: Former Smoker    Packs/day: 1.00    Years: 44.00    Pack years: 44.00    Types: Cigarettes    Quit date: 12/19/2019    Years since quitting: 0.6  . Smokeless tobacco: Never Used  . Tobacco comment: in process of quitting  Substance Use Topics  . Alcohol use: No    Alcohol/week: 0.0 standard drinks    Subjective:   I connected with Tiffany Murphy on 08/04/20 at  8:40 AM EST by a video enabled telemedicine application and verified that I am speaking with the correct person using two identifiers.   I discussed the limitations of evaluation and management by telemedicine and the availability of in person appointments. The patient expressed understanding and agreed to proceed. Provider in office/ patient is at home; provider and patient are only 2 people on video call.    Patient did a virtual visit on 1/18 with suspected bronchitis. She notes she improved from those symptoms but has recently re-started with cough/ congestion/sore throat/ "extreme  fatigue." Denies any known exposure to COVID but has not taken a home test;    Objective:  There were no vitals filed for this visit.  Lungs: Respirations unlabored;   Assessment:  1. Cough     Plan:  Patient asked for second round of antibiotics; I explained to her that with recurrent symptoms from over a month ago, we needed to consider that this was a different infection and would need to update a COVID test; CXR needs to be considered and need to know her COVID status to know how to proceed with imaging. She agreed and understood;  Time spent 11 minutes  No follow-ups on file.  Orders Placed This Encounter  Procedures  . Novel Coronavirus, NAA ([facility])    Order Specific Question:   Is this test for diagnosis or screening    Answer:   Diagnosis of  ill patient    Order Specific Question:   Symptomatic for COVID-19 as defined by CDC    Answer:   Yes    Order Specific Question:   Date of Symptom Onset    Answer:   08/04/2020    Order Specific Question:   Hospitalized for COVID-19    Answer:   No    Order Specific Question:   Admitted to ICU for COVID-19    Answer:   No    Order Specific Question:   Previously tested for COVID-19    Answer:   Yes    Order Specific Question:   Resident in a congregate (group) care setting    Answer:   No    Order Specific Question:   Is the patient student?    Answer:   No    Order Specific Question:   Employed in healthcare setting    Answer:   No    Order Specific Question:   Pregnant    Answer:   No    Order Specific Question:   Has patient completed COVID vaccination(s) (2 doses of Pfizer/Moderna 1 dose of [employer])    Answer:   Yes    Order Specific Question:   Has patient completed COVID Booster / 3rd dose    Answer:   Yes    Requested Prescriptions    No prescriptions requested or ordered in this encounter

## 2020-08-04 NOTE — Progress Notes (Signed)
Covid test scheduled for 3p today.  Arrival instructions given & pt verb understanding.

## 2020-08-06 ENCOUNTER — Telehealth: Payer: Self-pay

## 2020-08-06 LAB — SARS-COV-2, NAA 2 DAY TAT

## 2020-08-06 LAB — NOVEL CORONAVIRUS, NAA: SARS-CoV-2, NAA: DETECTED — AB

## 2020-08-06 NOTE — Telephone Encounter (Signed)
Covid Positive

## 2020-08-06 NOTE — Telephone Encounter (Signed)
[facility] rep states test is still pending.

## 2020-08-07 ENCOUNTER — Telehealth: Payer: Self-pay | Admitting: Family

## 2020-08-07 ENCOUNTER — Telehealth: Payer: Self-pay

## 2020-08-07 NOTE — Telephone Encounter (Signed)
Called to discuss with patient about COVID-19 symptoms and the use of one of the available treatments for those with mild to moderate Covid symptoms and at a high risk of hospitalization.  Pt appears to qualify for outpatient treatment due to co-morbid conditions and/or a member of an at-risk group in accordance with the FDA Emergency Use Authorization.    Symptom onset: Per chart - possibly 1 month ago Vaccinated: Yes Booster? No Immunocompromised? No Qualifiers: COPD,HTN  Unable to reach pt - Voice mailbox not set up, unable to leave message.  Tiffany Murphy

## 2020-08-07 NOTE — Telephone Encounter (Signed)
Discussed positive test results with pt.  Pt states some days she's fine & some days are worse.  She denies SOB or additional symptoms from virtual visit.  Booster added to chart per pt report on her vaccination card.

## 2020-08-07 NOTE — Telephone Encounter (Signed)
2nd attempt call. Ms. Tiffany Murphy tested positive on 2/15. Feeling worse with symptoms greater than about week. Continues to have a lot of fatigue. She is vaccinated and boosted. Based on inability to determine onset of symptoms and appears to beyond 10 days of symptoms at this point. Recommend supportive care and post-Covid Care Clinic if needed.   Terri Piedra, NP 08/07/2020 2:04 PM

## 2020-08-07 NOTE — Telephone Encounter (Addendum)
It looks like the [facility] nurse called her to discuss results. They didn't reach her either. Can you call her again?

## 2020-08-10 ENCOUNTER — Ambulatory Visit: Payer: Medicare Other | Admitting: Pulmonary Disease

## 2020-08-19 ENCOUNTER — Other Ambulatory Visit: Payer: Self-pay | Admitting: Family

## 2020-08-19 DIAGNOSIS — E785 Hyperlipidemia, unspecified: Secondary | ICD-10-CM

## 2020-08-19 DIAGNOSIS — F39 Unspecified mood [affective] disorder: Secondary | ICD-10-CM

## 2020-08-24 DIAGNOSIS — G4733 Obstructive sleep apnea (adult) (pediatric): Secondary | ICD-10-CM | POA: Diagnosis not present

## 2020-08-24 DIAGNOSIS — E785 Hyperlipidemia, unspecified: Secondary | ICD-10-CM | POA: Diagnosis not present

## 2020-08-24 DIAGNOSIS — F17210 Nicotine dependence, cigarettes, uncomplicated: Secondary | ICD-10-CM | POA: Diagnosis not present

## 2020-08-24 DIAGNOSIS — E559 Vitamin D deficiency, unspecified: Secondary | ICD-10-CM | POA: Diagnosis not present

## 2020-08-24 DIAGNOSIS — Z1159 Encounter for screening for other viral diseases: Secondary | ICD-10-CM | POA: Diagnosis not present

## 2020-08-24 DIAGNOSIS — M2550 Pain in unspecified joint: Secondary | ICD-10-CM | POA: Diagnosis not present

## 2020-08-24 DIAGNOSIS — G8929 Other chronic pain: Secondary | ICD-10-CM | POA: Diagnosis not present

## 2020-08-24 DIAGNOSIS — I10 Essential (primary) hypertension: Secondary | ICD-10-CM | POA: Diagnosis not present

## 2020-08-24 DIAGNOSIS — Z131 Encounter for screening for diabetes mellitus: Secondary | ICD-10-CM | POA: Diagnosis not present

## 2020-08-28 ENCOUNTER — Ambulatory Visit: Payer: Medicare Other | Admitting: Pulmonary Disease

## 2020-09-15 ENCOUNTER — Ambulatory Visit: Payer: Medicare Other | Admitting: Pulmonary Disease

## 2020-10-12 ENCOUNTER — Other Ambulatory Visit: Payer: Self-pay | Admitting: Student

## 2020-10-12 DIAGNOSIS — R32 Unspecified urinary incontinence: Secondary | ICD-10-CM

## 2020-10-12 DIAGNOSIS — M545 Low back pain, unspecified: Secondary | ICD-10-CM

## 2020-10-12 DIAGNOSIS — R159 Full incontinence of feces: Secondary | ICD-10-CM

## 2020-10-24 ENCOUNTER — Other Ambulatory Visit: Payer: Self-pay

## 2020-10-24 ENCOUNTER — Ambulatory Visit
Admission: RE | Admit: 2020-10-24 | Discharge: 2020-10-24 | Disposition: A | Discharge status: Home / Self Care | Payer: Medicare Other | Source: Ambulatory Visit | Attending: Student | Admitting: Student

## 2020-10-24 DIAGNOSIS — R32 Unspecified urinary incontinence: Secondary | ICD-10-CM

## 2020-10-24 DIAGNOSIS — M545 Low back pain, unspecified: Secondary | ICD-10-CM

## 2020-10-24 DIAGNOSIS — R159 Full incontinence of feces: Secondary | ICD-10-CM

## 2020-10-27 ENCOUNTER — Encounter: Payer: Self-pay | Admitting: Gastroenterology

## 2020-11-13 DIAGNOSIS — M545 Low back pain, unspecified: Secondary | ICD-10-CM | POA: Insufficient documentation

## 2020-11-13 DIAGNOSIS — M4310 Spondylolisthesis, site unspecified: Secondary | ICD-10-CM | POA: Insufficient documentation

## 2020-11-16 ENCOUNTER — Other Ambulatory Visit: Payer: Self-pay | Admitting: Family

## 2020-11-16 DIAGNOSIS — E785 Hyperlipidemia, unspecified: Secondary | ICD-10-CM

## 2020-11-17 ENCOUNTER — Other Ambulatory Visit: Payer: Self-pay

## 2020-11-17 ENCOUNTER — Emergency Department (HOSPITAL_COMMUNITY): Payer: Medicare Other

## 2020-11-17 ENCOUNTER — Encounter (HOSPITAL_COMMUNITY): Payer: Self-pay | Admitting: Emergency Medicine

## 2020-11-17 ENCOUNTER — Emergency Department (HOSPITAL_COMMUNITY)
Admission: EM | Admit: 2020-11-17 | Discharge: 2020-11-18 | Disposition: A | Discharge status: Home / Self Care | Payer: Medicare Other | Attending: Emergency Medicine | Admitting: Emergency Medicine

## 2020-11-17 DIAGNOSIS — I10 Essential (primary) hypertension: Secondary | ICD-10-CM | POA: Diagnosis not present

## 2020-11-17 DIAGNOSIS — Z7952 Long term (current) use of systemic steroids: Secondary | ICD-10-CM | POA: Diagnosis not present

## 2020-11-17 DIAGNOSIS — R079 Chest pain, unspecified: Secondary | ICD-10-CM | POA: Diagnosis not present

## 2020-11-17 DIAGNOSIS — J449 Chronic obstructive pulmonary disease, unspecified: Secondary | ICD-10-CM | POA: Insufficient documentation

## 2020-11-17 DIAGNOSIS — Y92410 Unspecified street and highway as the place of occurrence of the external cause: Secondary | ICD-10-CM | POA: Diagnosis not present

## 2020-11-17 DIAGNOSIS — S92254A Nondisplaced fracture of navicular [scaphoid] of right foot, initial encounter for closed fracture: Secondary | ICD-10-CM | POA: Diagnosis not present

## 2020-11-17 DIAGNOSIS — Z79899 Other long term (current) drug therapy: Secondary | ICD-10-CM | POA: Insufficient documentation

## 2020-11-17 DIAGNOSIS — J45909 Unspecified asthma, uncomplicated: Secondary | ICD-10-CM | POA: Diagnosis not present

## 2020-11-17 DIAGNOSIS — S32009A Unspecified fracture of unspecified lumbar vertebra, initial encounter for closed fracture: Secondary | ICD-10-CM | POA: Insufficient documentation

## 2020-11-17 DIAGNOSIS — Z87891 Personal history of nicotine dependence: Secondary | ICD-10-CM | POA: Insufficient documentation

## 2020-11-17 DIAGNOSIS — S3992XA Unspecified injury of lower back, initial encounter: Secondary | ICD-10-CM | POA: Diagnosis present

## 2020-11-17 MED ORDER — OXYCODONE-ACETAMINOPHEN 5-325 MG PO TABS: 1.0000 | ORAL_TABLET | Freq: Three times a day (TID) | ORAL | 0 refills | Status: DC | PRN

## 2020-11-17 NOTE — ED Notes (Signed)
Patient transported to X-ray 
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
## 2020-12-17 DIAGNOSIS — M5416 Radiculopathy, lumbar region: Secondary | ICD-10-CM | POA: Insufficient documentation

## 2021-01-06 ENCOUNTER — Other Ambulatory Visit: Payer: Self-pay | Admitting: Family

## 2021-01-06 ENCOUNTER — Telehealth: Payer: Self-pay | Admitting: Family

## 2021-01-06 ENCOUNTER — Other Ambulatory Visit: Payer: Self-pay | Admitting: Physician Assistant

## 2021-01-06 DIAGNOSIS — K219 Gastro-esophageal reflux disease without esophagitis: Secondary | ICD-10-CM

## 2021-01-06 NOTE — Telephone Encounter (Signed)
It looks like she has transferred her to care to a provider, Cipriano Mile, at [facility]. Please verify this change; if yes, please take my name off as PCP and make sure she knows to get new PCP to fill her medications for her.

## 2021-01-07 NOTE — Telephone Encounter (Signed)
I have called pt to varfy the information below. There was no answer, and I was no able to leave a message since the mailbox is full.

## 2021-01-12 ENCOUNTER — Other Ambulatory Visit: Payer: Self-pay | Admitting: Family

## 2021-01-12 ENCOUNTER — Other Ambulatory Visit: Payer: Self-pay | Admitting: Physician Assistant

## 2021-01-12 DIAGNOSIS — K219 Gastro-esophageal reflux disease without esophagitis: Secondary | ICD-10-CM

## 2021-01-13 ENCOUNTER — Other Ambulatory Visit: Payer: Self-pay | Admitting: Family
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
## 2021-02-03 ENCOUNTER — Other Ambulatory Visit: Payer: Self-pay | Admitting: Pulmonary Disease

## 2021-02-05 ENCOUNTER — Other Ambulatory Visit: Payer: Self-pay | Admitting: Pulmonary Disease

## 2021-02-22 ENCOUNTER — Other Ambulatory Visit: Payer: Self-pay | Admitting: Family

## 2021-02-22 DIAGNOSIS — E785 Hyperlipidemia, unspecified: Secondary | ICD-10-CM

## 2021-03-05 ENCOUNTER — Other Ambulatory Visit: Payer: Self-pay | Admitting: Family

## 2021-03-05 DIAGNOSIS — E785 Hyperlipidemia, unspecified: Secondary | ICD-10-CM

## 2021-03-11 ENCOUNTER — Other Ambulatory Visit: Payer: Self-pay | Admitting: Pulmonary Disease

## 2021-03-15 ENCOUNTER — Other Ambulatory Visit: Payer: Self-pay | Admitting: Pulmonary Disease

## 2021-03-16 ENCOUNTER — Telehealth: Payer: Self-pay | Admitting: Pulmonary Disease

## 2021-03-16 MED ORDER — TRELEGY ELLIPTA 100-62.5-25 MCG/INH IN AEPB: INHALATION_SPRAY | RESPIRATORY_TRACT | 0 refills | Status: DC

## 2021-03-16 NOTE — Telephone Encounter (Signed)
I have called and LM on VM for the pt to let her know that the trelegy has been sent to the pharmacy.  Nothing further is needed.

## 2021-03-17 ENCOUNTER — Emergency Department (HOSPITAL_COMMUNITY): Payer: Medicare Other

## 2021-03-17 ENCOUNTER — Inpatient Hospital Stay (HOSPITAL_COMMUNITY)
Admission: EM | Admit: 2021-03-17 | Discharge: 2021-03-20 | DRG: 872 | Disposition: A | Discharge status: Home / Self Care | Payer: Medicare Other | Attending: Family Medicine | Admitting: Family Medicine

## 2021-03-17 ENCOUNTER — Other Ambulatory Visit: Payer: Self-pay

## 2021-03-17 DIAGNOSIS — J449 Chronic obstructive pulmonary disease, unspecified: Secondary | ICD-10-CM | POA: Diagnosis present

## 2021-03-17 DIAGNOSIS — Z79899 Other long term (current) drug therapy: Secondary | ICD-10-CM

## 2021-03-17 DIAGNOSIS — K589 Irritable bowel syndrome without diarrhea: Secondary | ICD-10-CM | POA: Diagnosis present

## 2021-03-17 DIAGNOSIS — J4489 Other specified chronic obstructive pulmonary disease: Secondary | ICD-10-CM | POA: Diagnosis present

## 2021-03-17 DIAGNOSIS — K529 Noninfective gastroenteritis and colitis, unspecified: Secondary | ICD-10-CM | POA: Diagnosis not present

## 2021-03-17 DIAGNOSIS — N179 Acute kidney failure, unspecified: Secondary | ICD-10-CM

## 2021-03-17 DIAGNOSIS — Z8249 Family history of ischemic heart disease and other diseases of the circulatory system: Secondary | ICD-10-CM

## 2021-03-17 DIAGNOSIS — F32A Depression, unspecified: Secondary | ICD-10-CM | POA: Diagnosis present

## 2021-03-17 DIAGNOSIS — E876 Hypokalemia: Secondary | ICD-10-CM | POA: Diagnosis present

## 2021-03-17 DIAGNOSIS — Z20822 Contact with and (suspected) exposure to covid-19: Secondary | ICD-10-CM | POA: Diagnosis present

## 2021-03-17 DIAGNOSIS — Z6834 Body mass index (BMI) 34.0-34.9, adult: Secondary | ICD-10-CM | POA: Diagnosis not present

## 2021-03-17 DIAGNOSIS — R251 Tremor, unspecified: Secondary | ICD-10-CM | POA: Diagnosis present

## 2021-03-17 DIAGNOSIS — E860 Dehydration: Secondary | ICD-10-CM | POA: Diagnosis present

## 2021-03-17 DIAGNOSIS — F419 Anxiety disorder, unspecified: Secondary | ICD-10-CM | POA: Diagnosis present

## 2021-03-17 DIAGNOSIS — G4730 Sleep apnea, unspecified: Secondary | ICD-10-CM | POA: Diagnosis present

## 2021-03-17 DIAGNOSIS — Z880 Allergy status to penicillin: Secondary | ICD-10-CM

## 2021-03-17 DIAGNOSIS — Z87891 Personal history of nicotine dependence: Secondary | ICD-10-CM

## 2021-03-17 DIAGNOSIS — K219 Gastro-esophageal reflux disease without esophagitis: Secondary | ICD-10-CM | POA: Diagnosis present

## 2021-03-17 DIAGNOSIS — Z888 Allergy status to other drugs, medicaments and biological substances status: Secondary | ICD-10-CM | POA: Diagnosis not present

## 2021-03-17 DIAGNOSIS — E872 Acidosis, unspecified: Secondary | ICD-10-CM | POA: Diagnosis present

## 2021-03-17 DIAGNOSIS — Z7951 Long term (current) use of inhaled steroids: Secondary | ICD-10-CM

## 2021-03-17 DIAGNOSIS — E669 Obesity, unspecified: Secondary | ICD-10-CM | POA: Diagnosis present

## 2021-03-17 DIAGNOSIS — G8929 Other chronic pain: Secondary | ICD-10-CM | POA: Diagnosis present

## 2021-03-17 DIAGNOSIS — Z8719 Personal history of other diseases of the digestive system: Secondary | ICD-10-CM

## 2021-03-17 DIAGNOSIS — D696 Thrombocytopenia, unspecified: Secondary | ICD-10-CM | POA: Diagnosis present

## 2021-03-17 DIAGNOSIS — I10 Essential (primary) hypertension: Secondary | ICD-10-CM | POA: Diagnosis present

## 2021-03-17 DIAGNOSIS — A09 Infectious gastroenteritis and colitis, unspecified: Secondary | ICD-10-CM | POA: Diagnosis present

## 2021-03-17 DIAGNOSIS — Z833 Family history of diabetes mellitus: Secondary | ICD-10-CM

## 2021-03-17 DIAGNOSIS — A419 Sepsis, unspecified organism: Principal | ICD-10-CM

## 2021-03-17 DIAGNOSIS — Z820 Family history of epilepsy and other diseases of the nervous system: Secondary | ICD-10-CM

## 2021-03-17 LAB — CBC WITH DIFFERENTIAL/PLATELET
Abs Immature Granulocytes: 0.06 10*3/uL (ref 0.00–0.07)
Basophils Absolute: 0 10*3/uL (ref 0.0–0.1)
Basophils Relative: 0 %
Eosinophils Absolute: 0 10*3/uL (ref 0.0–0.5)
Eosinophils Relative: 0 %
HCT: 45.4 % (ref 36.0–46.0)
Hemoglobin: 14.7 g/dL (ref 12.0–15.0)
Immature Granulocytes: 1 %
Lymphocytes Relative: 4 %
Lymphs Abs: 0.5 10*3/uL — ABNORMAL LOW (ref 0.7–4.0)
MCH: 31.7 pg (ref 26.0–34.0)
MCHC: 32.4 g/dL (ref 30.0–36.0)
MCV: 98.1 fL (ref 80.0–100.0)
Monocytes Absolute: 0.3 10*3/uL (ref 0.1–1.0)
Monocytes Relative: 3 %
Neutro Abs: 10.8 10*3/uL — ABNORMAL HIGH (ref 1.7–7.7)
Neutrophils Relative %: 92 %
Platelets: 137 10*3/uL — ABNORMAL LOW (ref 150–400)
RBC: 4.63 MIL/uL (ref 3.87–5.11)
RDW: 14.1 % (ref 11.5–15.5)
WBC: 11.6 10*3/uL — ABNORMAL HIGH (ref 4.0–10.5)
nRBC: 0 % (ref 0.0–0.2)

## 2021-03-17 LAB — COMPREHENSIVE METABOLIC PANEL
ALT: 21 U/L (ref 0–44)
AST: 24 U/L (ref 15–41)
Albumin: 3.3 g/dL — ABNORMAL LOW (ref 3.5–5.0)
Alkaline Phosphatase: 58 U/L (ref 38–126)
Anion gap: 9 (ref 5–15)
BUN: 20 mg/dL (ref 8–23)
CO2: 23 mmol/L (ref 22–32)
Calcium: 8.1 mg/dL — ABNORMAL LOW (ref 8.9–10.3)
Chloride: 106 mmol/L (ref 98–111)
Creatinine, Ser: 1.1 mg/dL — ABNORMAL HIGH (ref 0.44–1.00)
GFR, Estimated: 54 mL/min — ABNORMAL LOW (ref >60)
Glucose, Bld: 156 mg/dL — ABNORMAL HIGH (ref 70–99)
Potassium: 3.1 mmol/L — ABNORMAL LOW (ref 3.5–5.1)
Sodium: 138 mmol/L (ref 135–145)
Total Bilirubin: 0.6 mg/dL (ref 0.3–1.2)
Total Protein: 6.1 g/dL — ABNORMAL LOW (ref 6.5–8.1)

## 2021-03-17 LAB — PROTIME-INR
INR: 1.1 (ref 0.8–1.2)
Prothrombin Time: 14.3 seconds (ref 11.4–15.2)

## 2021-03-17 LAB — LACTIC ACID, PLASMA
Lactic Acid, Venous: 2.3 mmol/L (ref 0.5–1.9)
Lactic Acid, Venous: 3 mmol/L (ref 0.5–1.9)

## 2021-03-17 LAB — APTT: aPTT: 28 seconds (ref 24–36)

## 2021-03-17 MED ORDER — SODIUM CHLORIDE 0.9 % IV BOLUS (SEPSIS)
1000.0000 mL | Freq: Once | INTRAVENOUS | Status: AC
  Administered 2021-03-17: 1000 mL via INTRAVENOUS

## 2021-03-17 MED ORDER — SODIUM CHLORIDE 0.9 % IV BOLUS
1000.0000 mL | Freq: Once | INTRAVENOUS | Status: AC
  Administered 2021-03-18: 1000 mL via INTRAVENOUS

## 2021-03-17 MED ORDER — ONDANSETRON HCL 4 MG/2ML IJ SOLN
4.0000 mg | Freq: Four times a day (QID) | INTRAMUSCULAR | Status: DC | PRN
  Administered 2021-03-18 – 2021-03-19 (×3): 4 mg via INTRAVENOUS
  Filled 2021-03-17 (×3): qty 2

## 2021-03-17 MED ORDER — ACETAMINOPHEN 325 MG PO TABS
650.0000 mg | ORAL_TABLET | Freq: Four times a day (QID) | ORAL | Status: DC | PRN
  Administered 2021-03-18 (×2): 650 mg via ORAL
  Filled 2021-03-17 (×2): qty 2

## 2021-03-17 MED ORDER — METRONIDAZOLE 500 MG/100ML IV SOLN
500.0000 mg | Freq: Three times a day (TID) | INTRAVENOUS | Status: DC
  Administered 2021-03-18 (×2): 500 mg via INTRAVENOUS
  Filled 2021-03-17 (×2): qty 100

## 2021-03-17 MED ORDER — ACETAMINOPHEN 650 MG RE SUPP: 650.0000 mg | Freq: Four times a day (QID) | RECTAL | Status: DC | PRN

## 2021-03-17 MED ORDER — POTASSIUM CHLORIDE 10 MEQ/100ML IV SOLN
10.0000 meq | INTRAVENOUS | Status: AC
  Filled 2021-03-17: qty 100

## 2021-03-17 MED ORDER — POTASSIUM CHLORIDE 10 MEQ/100ML IV SOLN
10.0000 meq | INTRAVENOUS | Status: AC
Start: 2021-03-17 — End: 2021-03-18
  Administered 2021-03-18 (×3): 10 meq via INTRAVENOUS
  Filled 2021-03-17 (×3): qty 100

## 2021-03-17 MED ORDER — SODIUM CHLORIDE 0.9 % IV SOLN
2.0000 g | INTRAVENOUS | Status: DC
  Administered 2021-03-17 – 2021-03-19 (×3): 2 g via INTRAVENOUS
  Filled 2021-03-17 (×3): qty 20

## 2021-03-17 MED ORDER — IOHEXOL 350 MG/ML SOLN
75.0000 mL | Freq: Once | INTRAVENOUS | Status: AC | PRN
  Administered 2021-03-17: 75 mL via INTRAVENOUS

## 2021-03-17 MED ORDER — METRONIDAZOLE 500 MG/100ML IV SOLN
500.0000 mg | Freq: Once | INTRAVENOUS | Status: AC
  Administered 2021-03-17: 500 mg via INTRAVENOUS
  Filled 2021-03-17: qty 100

## 2021-03-17 MED ORDER — LACTATED RINGERS IV SOLN: INTRAVENOUS | Status: DC

## 2021-03-17 MED ORDER — LEVOFLOXACIN IN D5W 750 MG/150ML IV SOLN: 750.0000 mg | Freq: Once | INTRAVENOUS | Status: DC

## 2021-03-17 NOTE — ED Triage Notes (Signed)
Pt arrived via [facility] from home. Per EMS, pt called for transport with c/o abd pain, N/V, chills, and diarrhea x2 days. Pt had no active vomiting with EMS but was administered 4mg  IV zofran of nausea. Pt reports last vomiting and diarrhea episodes this morning. Pt further reports bright red possible blood in vomit and diarrhea yesterday. Pain reports pain to be LLQ 3/10 at present. Pt arrived to ED caox4.

## 2021-03-17 NOTE — Sepsis Progress Note (Signed)
Continue to follow for sepsis monitoring. Lactic acid is pending.

## 2021-03-17 NOTE — ED Provider Notes (Signed)
[facility] Provider Note   CSN: [id number] Arrival date & time: 03/17/21  1714     History Chief Complaint  Patient presents with   Abdominal Pain    Tiffany Murphy is a 70 y.o. female.  The history is provided by the patient.  Abdominal Pain Pain location:  LLQ Pain quality: aching   Pain radiates to:  Does not radiate Pain severity:  Moderate Onset quality:  Gradual Duration:  2 days Timing:  Constant Chronicity:  New Context: previous surgery   Context: not sick contacts and not suspicious food intake   Relieved by:  Nothing Worsened by:  Nothing Associated symptoms: diarrhea, nausea and vomiting   Associated symptoms: no chest pain, no chills, no cough, no dysuria, no fever, no hematuria, no melena, no shortness of breath and no sore throat   Associated symptoms comment:  Redness in stool      Past Medical History:  Diagnosis Date   Anxiety    Asthma    Bronchitis    Closed fracture of left distal radius 10/25/2018   COPD (chronic obstructive pulmonary disease) (HCC)    Depression    GERD (gastroesophageal reflux disease)    Headache    Hypercholesteremia    Hypertension    IBS (irritable bowel syndrome)    Sleep apnea    Tobacco abuse     Patient Active Problem List   Diagnosis Date Noted   Colitis 03/17/2021   Subjective memory complaints 02/21/2019   Frequency of urination- pt thinks due to HCTZ BP med 02/21/2019   Hyperlipidemia with target LDL less than 70 11/13/2018   Closed fracture of left distal radius 10/25/2018   Tobacco abuse counseling 07/12/2018   Sleep difficulties- poor sleep hygeine 07/12/2018   Chronic pain disorder 07/12/2018   Chronic joint pain 07/12/2018   OSA and COPD overlap syndrome ([place]) 04/30/2018   Chronic fatigue 04/30/2018   Lung nodule seen on imaging study 04/30/2018   Vitamin D deficiency 04/30/2018   Tobacco use disorder 04/11/2018   Elevated LDL cholesterol level 04/11/2018    Chronic obstructive pulmonary disease ([place]) 04/11/2018   GERD (gastroesophageal reflux disease) 04/11/2018   Asthma 04/11/2018   Obesity, Class I, BMI 30-34.9 04/11/2018   Glucose intolerance (impaired glucose tolerance) 04/11/2018   Irritable bowel syndrome 04/11/2018   Chronic bilateral upper abdominal pain 04/11/2018   Panic attacks 04/11/2018   Mood disorder ([place]) 04/11/2018   Hypokalemia 04/11/2018   Environmental and seasonal allergies 04/11/2018   Elevated coronary artery calcium score 12/01/2014   Benign essential HTN 07/11/2013   Depression with anxiety 07/11/2013   Pure hypercholesterolemia 07/11/2013   Nicotine addiction 06/15/2013   Overweight 06/15/2013    Past Surgical History:  Procedure Laterality Date   APPENDECTOMY     COLONOSCOPY  08/26/2015   Colonic polyps status post polypectomy. Minimal sigmoid diverticulosis.    ESOPHAGOGASTRODUODENOSCOPY  02/25/2011   Large hiatal hernia otherwise normal EGD.   OPEN REDUCTION INTERNAL FIXATION (ORIF) DISTAL RADIAL FRACTURE Left 10/25/2018   Procedure: OPEN REDUCTION INTERNAL FIXATION (ORIF)LEFT  DISTAL RADIAL FRACTURE;  Surgeon: Marchia Bond, MD;  Location: [facility];  Service: Orthopedics;  Laterality: Left;   VAGINAL HYSTERECTOMY       OB History   No obstetric history on file.     Family History  Problem Relation Age of Onset   Alzheimer's disease Father 58   Diabetes Father    CAD Mother 65   Colon cancer  Neg Hx    Esophageal cancer Neg Hx     Social History   Tobacco Use   Smoking status: Former    Packs/day: 1.00    Years: 44.00    Pack years: 44.00    Types: Cigarettes    Quit date: 12/19/2019    Years since quitting: 1.2   Smokeless tobacco: Never   Tobacco comments:    in process of quitting  Vaping Use   Vaping Use: Former   Devices: did it for 6 months and stopped   Substance Use Topics   Alcohol use: No    Alcohol/week: 0.0 standard drinks   Drug use: Yes    Types:  Marijuana    Comment: 2 puffs per day    Home Medications Prior to Admission medications   Medication Sig Start Date End Date Taking? Authorizing Provider  gabapentin (NEURONTIN) 300 MG capsule TAKE ONE CAPSULE BY MOUTH IN THE MORNING, 1 CAPSULES IN THE AFTERNOON, AND 2 CAPSULES AT BEDTIME 05/12/20   Marrian Salvage, FNP  acyclovir (ZOVIRAX) 200 MG capsule Take 1 capsule (200 mg total) by mouth daily. 03/23/20   Marrian Salvage, FNP  albuterol (VENTOLIN HFA) 108 (90 Base) MCG/ACT inhaler Inhale 2 puffs into the lungs every 6 (six) hours as needed for wheezing. 07/07/20   Marrian Salvage, FNP  b complex vitamins capsule Take 1 capsule by mouth daily.    [provider]  Cholecalciferol (VITAMIN D) 125 MCG (5000 UT) CAPS Take 5,000 Units by mouth once a week.    [provider]  Cyanocobalamin (VITAMIN B 12 PO) Take 1 tablet by mouth daily.    [provider]  doxycycline (VIBRA-TABS) 100 MG tablet Take 1 tablet (100 mg total) by mouth 2 (two) times daily. 07/07/20   Marrian Salvage, FNP  fluticasone (FLONASE) 50 MCG/ACT nasal spray USE 2 SPRAYS IN EACH NOSTRIL EVERY DAY Patient taking differently: Place 2 sprays into both nostrils daily.  07/15/19   Opalski, Neoma Laming, DO  Fluticasone-Umeclidin-Vilant (TRELEGY ELLIPTA) 100-62.5-25 MCG/INH AEPB INHALE 1 PUFF ONCE DAILY 03/16/21   Sherrilyn Rist A, MD  ibuprofen (ADVIL) 800 MG tablet Take 1 tablet (800 mg total) by mouth every 8 (eight) hours as needed. 07/07/20   Marrian Salvage, FNP  LORazepam (ATIVAN) 0.5 MG tablet Take 1 tablet by mouth twice daily as needed for anxiety 08/19/20   Marrian Salvage, FNP  losartan (COZAAR) 50 MG tablet Take 1 tablet (50 mg total) by mouth 2 (two) times daily. 06/09/20   Marrian Salvage, FNP  montelukast (SINGULAIR) 10 MG tablet Take 1 tablet (10 mg total) by mouth at bedtime. 03/23/20   Marrian Salvage, FNP  Multiple Vitamin (MULTIVITAMIN  WITH MINERALS) TABS tablet Take 1 tablet by mouth daily.    [provider]  nitrofurantoin, macrocrystal-monohydrate, (MACROBID) 100 MG capsule  12/05/19   [provider]  Omega-3 Fatty Acids (FISH OIL) 1000 MG CPDR Take 1,000 mg by mouth daily.    [provider]  omeprazole (PRILOSEC) 40 MG capsule Take 1 capsule (40 mg total) by mouth daily. 11/29/19   Lorrene Reid, PA-C  oxyCODONE-acetaminophen (PERCOCET/ROXICET) 5-325 MG tablet Take 1-2 tablets by mouth every 8 (eight) hours as needed for severe pain. 11/17/20   Davonna Belling, MD  rosuvastatin (CRESTOR) 40 MG tablet TAKE 1/2 (ONE-HALF) TABLET BY MOUTH AT BEDTIME 11/17/20   Marrian Salvage, FNP  sertraline (ZOLOFT) 100 MG tablet Take 2 tablets (200 mg total)  by mouth daily. 06/08/20   Marrian Salvage, FNP    Allergies    Penicillins, Lisinopril, Lovastatin, and Other  Review of Systems   Review of Systems  Constitutional:  Negative for chills and fever.  HENT:  Negative for ear pain and sore throat.   Eyes:  Negative for pain and visual disturbance.  Respiratory:  Negative for cough and shortness of breath.   Cardiovascular:  Negative for chest pain and palpitations.  Gastrointestinal:  Positive for abdominal pain, diarrhea, nausea and vomiting. Negative for melena.  Genitourinary:  Negative for dysuria and hematuria.  Musculoskeletal:  Negative for arthralgias and back pain.  Skin:  Negative for color change and rash.  Neurological:  Negative for seizures and syncope.  All other systems reviewed and are negative.  Physical Exam Updated Vital Signs BP (!) 137/59   Pulse 84   Temp (!) 102.6 F (39.2 C) (Rectal)   Resp (!) 25   Ht 5\' 2"  (1.575 m)   Wt 86.2 kg   SpO2 94%   BMI 34.75 kg/m   Physical Exam Vitals and nursing note reviewed.  Constitutional:      General: She is not in acute distress.    Appearance: She is well-developed. She is not ill-appearing.  HENT:     Head:  Normocephalic and atraumatic.     Mouth/Throat:     Mouth: Mucous membranes are moist.  Eyes:     Extraocular Movements: Extraocular movements intact.     Conjunctiva/sclera: Conjunctivae normal.     Pupils: Pupils are equal, round, and reactive to light.  Cardiovascular:     Rate and Rhythm: Normal rate and regular rhythm.     Heart sounds: Normal heart sounds. No murmur heard. Pulmonary:     Effort: Pulmonary effort is normal. No respiratory distress.     Breath sounds: Normal breath sounds.  Abdominal:     Palpations: Abdomen is soft.     Tenderness: There is abdominal tenderness in the left lower quadrant. There is no guarding or rebound.     Hernia: No hernia is present.  Genitourinary:    Rectum: Guaiac result negative.  Musculoskeletal:     Cervical back: Neck supple.  Skin:    General: Skin is warm and dry.     Capillary Refill: Capillary refill takes less than 2 seconds.  Neurological:     General: No focal deficit present.     Mental Status: She is alert.    ED Results / Procedures / Treatments   Labs (all labs ordered are listed, but only abnormal results are displayed) Labs Reviewed  LACTIC ACID, PLASMA - Abnormal; Notable for the following components:      Result Value   Lactic Acid, Venous 2.3 (*)    All other components within normal limits  LACTIC ACID, PLASMA - Abnormal; Notable for the following components:   Lactic Acid, Venous 3.0 (*)    All other components within normal limits  COMPREHENSIVE METABOLIC PANEL - Abnormal; Notable for the following components:   Potassium 3.1 (*)    Glucose, Bld 156 (*)    Creatinine, Ser 1.10 (*)    Calcium 8.1 (*)    Total Protein 6.1 (*)    Albumin 3.3 (*)    GFR, Estimated 54 (*)    All other components within normal limits  CBC WITH DIFFERENTIAL/PLATELET - Abnormal; Notable for the following components:   WBC 11.6 (*)    Platelets 137 (*)    Neutro  Abs 10.8 (*)    Lymphs Abs 0.5 (*)    All other components  within normal limits  RESP PANEL BY RT-PCR (FLU A&B, COVID) ARPGX2  CULTURE, BLOOD (ROUTINE X 2)  CULTURE, BLOOD (ROUTINE X 2)  URINE CULTURE  PROTIME-INR  APTT  URINALYSIS, ROUTINE W REFLEX MICROSCOPIC  POC OCCULT BLOOD, ED    EKG EKG Interpretation  Date/Time:  Wednesday March 17 2021 17:19:46 EDT Ventricular Rate:  90 PR Interval:  153 QRS Duration: 74 QT Interval:  361 QTC Calculation: 442 R Axis:   26 Text Interpretation: Sinus rhythm Confirmed by Lennice Sites ([id number]) on 03/17/2021 5:28:49 PM  Radiology CT ABDOMEN PELVIS W CONTRAST  Result Date: 03/17/2021 CLINICAL DATA:  Abdominal pain, nausea and vomiting, weakness, diarrhea EXAM: CT ABDOMEN AND PELVIS WITH CONTRAST TECHNIQUE: Multidetector CT imaging of the abdomen and pelvis was performed using the standard protocol following bolus administration of intravenous contrast. CONTRAST:  39mL OMNIPAQUE IOHEXOL 350 MG/ML SOLN COMPARISON:  None. FINDINGS: Lower chest: No acute pleural or parenchymal lung disease. Small hiatal hernia. Hepatobiliary: No focal liver abnormality is seen. No gallstones, gallbladder wall thickening, or biliary dilatation. Pancreas: Unremarkable. No pancreatic ductal dilatation or surrounding inflammatory changes. Spleen: Mild splenomegaly measuring up 14 cm in craniocaudal length. No focal abnormality. Adrenals/Urinary Tract: No urinary tract calculi or obstructive uropathy. The kidneys enhance normally and symmetrically. The adrenals and bladder are unremarkable. Stomach/Bowel: No bowel obstruction or ileus. There is diffuse mural thickening of the colon compatible with inflammatory or infectious colitis. The appendix is surgically absent. Vascular/Lymphatic: Aortic atherosclerosis. No enlarged abdominal or pelvic lymph nodes. Reproductive: Status post hysterectomy. No adnexal masses. Other: No free fluid or free gas.  No abdominal wall hernia. Musculoskeletal: No acute or destructive bony lesions.  Reconstructed images demonstrate no additional findings. IMPRESSION: 1. Diffuse colonic wall thickening consistent with inflammatory or infectious colitis. 2.  Aortic Atherosclerosis (ICD10-I70.0). 3. Small hiatal hernia. Electronically Signed   By: Randa Ngo M.D.   On: 03/17/2021 21:07   DG Chest Port 1 View  Result Date: 03/17/2021 CLINICAL DATA:  Sepsis, abdominal pain, nausea and vomiting, chills EXAM: PORTABLE CHEST 1 VIEW COMPARISON:  11/17/2020 FINDINGS: Single frontal view of the chest demonstrates an unremarkable cardiac silhouette. No acute airspace disease, effusion, or pneumothorax. No acute bony abnormalities. Hypertrophic changes are seen at the first costochondral junctions. IMPRESSION: 1. No acute intrathoracic process. Electronically Signed   By: Randa Ngo M.D.   On: 03/17/2021 18:31    Procedures .Critical Care Performed by: Lennice Sites, DO Authorized by: Lennice Sites, DO   Critical care provider statement:    Critical care time (minutes):  40   Critical care was necessary to treat or prevent imminent or life-threatening deterioration of the following conditions:  Sepsis   Critical care was time spent personally by me on the following activities:  Blood draw for specimens, development of treatment plan with patient or surrogate, discussions with consultants, evaluation of patient's response to treatment, examination of patient, obtaining history from patient or surrogate, ordering and performing treatments and interventions, ordering and review of laboratory studies, ordering and review of radiographic studies, pulse oximetry, review of old charts and re-evaluation of patient's condition   I assumed direction of critical care for this patient from another provider in my specialty: no     Medications Ordered in ED Medications  lactated ringers infusion ( Intravenous New Bag/Given 03/17/21 1841)  cefTRIAXone (ROCEPHIN) 2 g in sodium chloride 0.9 % 100 mL  IVPB (0 g  Intravenous Stopped 03/17/21 2017)  potassium chloride 10 mEq in 100 mL IVPB (has no administration in time range)  sodium chloride 0.9 % bolus 1,000 mL (has no administration in time range)  sodium chloride 0.9 % bolus 1,000 mL (0 mLs Intravenous Stopped 03/17/21 1840)  metroNIDAZOLE (FLAGYL) IVPB 500 mg (0 mg Intravenous Stopped 03/17/21 2035)  iohexol (OMNIPAQUE) 350 MG/ML injection 75 mL (75 mLs Intravenous Contrast Given 03/17/21 2042)    ED Course  I have reviewed the triage vital signs and the nursing notes.  Pertinent labs & imaging results that were available during my care of the patient were reviewed by me and considered in my medical decision making (see chart for details).    MDM Rules/Calculators/A&P                           Tieisha Darden is here with abdominal pain, nausea, vomiting.  Febrile and tachycardic upon arrival.  Code sepsis initiated.  Brown stool on exam.  Left lower quadrant abdominal pain for 2 days with loose stools with nausea and vomiting.  Suspect colitis versus UTI.  Denies any suspicious food intake or recent antibiotic use.  Sepsis work-up initiated.  Broad-spectrum IV antibiotics started.  IV fluids and Tylenol given.  CT scan consistent with a colitis likely infectious given her fever.  Mild leukocytosis.  Lactic acidosis to 2.3 and 3.  2 L IV fluids ordered.  Potassium is 3.1 and repletion ordered.  Patient to be admitted to medicine service for further sepsis care.  Hemodynamic stable.  No hypotension.  Has had several large watery bowel movements and suspect she is still volume depleted.  This chart was dictated using voice recognition software.  Despite best efforts to proofread,  errors can occur which can change the documentation meaning.  Final Clinical Impression(s) / ED Diagnoses Final diagnoses:  Sepsis, due to unspecified organism, unspecified whether acute organ dysfunction present [facility])  Colitis    Rx / DC Orders ED Discharge Orders      None        Lennice Sites, DO 03/17/21 2145

## 2021-03-17 NOTE — Sepsis Progress Note (Signed)
Code Sepsis protocol being monitored by eLink. 

## 2021-03-17 NOTE — H&P (Signed)
History and Physical    Tiffany Murphy GMW:[id number] DOB: Oct 17, 1950 DOA: 03/17/2021  PCP: Cipriano Mile, NP Patient coming from: Home  Chief Complaint: Abdominal pain  HPI: Tiffany Murphy is a 70 y.o. female with medical history significant of COPD, hypertension, hyperlipidemia, GERD, IBS, sleep apnea presented to the ED via EMS complaining of abdominal pain, nausea, vomiting, diarrhea, and chills.  In the ED, patient was febrile.  Not tachycardic or hypotensive.  Labs notable for WBC 11.6.  Platelet count 137k.  Potassium 3.1.  BUN 20, creatinine 1.1 (baseline 0.8).  Normal LFTs.  Lactic acid 2.3.  UA and urine culture pending.  Blood culture x2 drawn and pending.  COVID and influenza PCR pending.  Chest x-ray showing no acute intrathoracic process.  CT abdomen pelvis showing diffuse colonic wall thickening consistent with inflammatory or infectious colitis. Brown stool noted on rectal exam. Patient was given ceftriaxone, Flagyl, and 1 L fluid bolus.  Repeat lactate 3.0.  Additional 1 L fluid bolus ordered.  Patient reports 2-day history of vomiting, diarrhea, and bilateral lower quadrant abdominal pain.  States she has not been able to tolerate any p.o. intake.  She is constantly running to the bathroom as she is having profuse watery diarrhea.  The diarrhea is green in color but she had 1 episode where it was bloody.  She does not take any blood thinners.  Denies any recent antibiotic use.  Also reporting fevers and chills.  Denies any recent sick contacts.  She is fully vaccinated against COVID.  Reports chronic cough and shortness of breath due to COPD, no recent change.  Denies chest pain.  Review of Systems:  All systems reviewed and apart from history of presenting illness, are negative.  Past Medical History:  Diagnosis Date   Anxiety    Asthma    Bronchitis    Closed fracture of left distal radius 10/25/2018   COPD (chronic obstructive pulmonary disease) (HCC)    Depression    GERD  (gastroesophageal reflux disease)    Headache    Hypercholesteremia    Hypertension    IBS (irritable bowel syndrome)    Sleep apnea    Tobacco abuse     Past Surgical History:  Procedure Laterality Date   APPENDECTOMY     COLONOSCOPY  08/26/2015   Colonic polyps status post polypectomy. Minimal sigmoid diverticulosis.    ESOPHAGOGASTRODUODENOSCOPY  02/25/2011   Large hiatal hernia otherwise normal EGD.   OPEN REDUCTION INTERNAL FIXATION (ORIF) DISTAL RADIAL FRACTURE Left 10/25/2018   Procedure: OPEN REDUCTION INTERNAL FIXATION (ORIF)LEFT  DISTAL RADIAL FRACTURE;  Surgeon: Marchia Bond, MD;  Location: [place];  Service: Orthopedics;  Laterality: Left;   VAGINAL HYSTERECTOMY       reports that she quit smoking about 14 months ago. Her smoking use included cigarettes. She has a 44.00 pack-year smoking history. She has never used smokeless tobacco. She reports current drug use. Drug: Marijuana. She reports that she does not drink alcohol.  Allergies  Allergen Reactions   Penicillins Hives and Shortness Of Breath   Lisinopril Other (See Comments)    Makes feel bad   Lovastatin     Cramps in legs and feet   Other     Fragrances in soaps, perfumes, shampoos, conditioners    Family History  Problem Relation Age of Onset   Alzheimer's disease Father 68   Diabetes Father    CAD Mother 44   Colon cancer Neg Hx    Esophageal cancer Neg  Hx     Prior to Admission medications   Medication Sig Start Date End Date Taking? Authorizing Provider  gabapentin (NEURONTIN) 300 MG capsule TAKE ONE CAPSULE BY MOUTH IN THE MORNING, 1 CAPSULES IN THE AFTERNOON, AND 2 CAPSULES AT BEDTIME 05/12/20   Marrian Salvage, FNP  acyclovir (ZOVIRAX) 200 MG capsule Take 1 capsule (200 mg total) by mouth daily. 03/23/20   Marrian Salvage, FNP  albuterol (VENTOLIN HFA) 108 (90 Base) MCG/ACT inhaler Inhale 2 puffs into the lungs every 6 (six) hours as needed for wheezing. 07/07/20    Marrian Salvage, FNP  b complex vitamins capsule Take 1 capsule by mouth daily.    [provider]  Cholecalciferol (VITAMIN D) 125 MCG (5000 UT) CAPS Take 5,000 Units by mouth once a week.    [provider]  Cyanocobalamin (VITAMIN B 12 PO) Take 1 tablet by mouth daily.    [provider]  doxycycline (VIBRA-TABS) 100 MG tablet Take 1 tablet (100 mg total) by mouth 2 (two) times daily. 07/07/20   Marrian Salvage, FNP  fluticasone (FLONASE) 50 MCG/ACT nasal spray USE 2 SPRAYS IN EACH NOSTRIL EVERY DAY Patient taking differently: Place 2 sprays into both nostrils daily.  07/15/19   Opalski, Neoma Laming, DO  Fluticasone-Umeclidin-Vilant (TRELEGY ELLIPTA) 100-62.5-25 MCG/INH AEPB INHALE 1 PUFF ONCE DAILY 03/16/21   Sherrilyn Rist A, MD  ibuprofen (ADVIL) 800 MG tablet Take 1 tablet (800 mg total) by mouth every 8 (eight) hours as needed. 07/07/20   Marrian Salvage, FNP  LORazepam (ATIVAN) 0.5 MG tablet Take 1 tablet by mouth twice daily as needed for anxiety 08/19/20   Marrian Salvage, FNP  losartan (COZAAR) 50 MG tablet Take 1 tablet (50 mg total) by mouth 2 (two) times daily. 06/09/20   Marrian Salvage, FNP  montelukast (SINGULAIR) 10 MG tablet Take 1 tablet (10 mg total) by mouth at bedtime. 03/23/20   Marrian Salvage, FNP  Multiple Vitamin (MULTIVITAMIN WITH MINERALS) TABS tablet Take 1 tablet by mouth daily.    [provider]  nitrofurantoin, macrocrystal-monohydrate, (MACROBID) 100 MG capsule  12/05/19   [provider]  Omega-3 Fatty Acids (FISH OIL) 1000 MG CPDR Take 1,000 mg by mouth daily.    [provider]  omeprazole (PRILOSEC) 40 MG capsule Take 1 capsule (40 mg total) by mouth daily. 11/29/19   Lorrene Reid, PA-C  oxyCODONE-acetaminophen (PERCOCET/ROXICET) 5-325 MG tablet Take 1-2 tablets by mouth every 8 (eight) hours as needed for severe pain. 11/17/20   Davonna Belling, MD  rosuvastatin  (CRESTOR) 40 MG tablet TAKE 1/2 (ONE-HALF) TABLET BY MOUTH AT BEDTIME 11/17/20   Marrian Salvage, FNP  sertraline (ZOLOFT) 100 MG tablet Take 2 tablets (200 mg total) by mouth daily. 06/08/20   Marrian Salvage, FNP    Physical Exam: Vitals:   03/17/21 1800 03/17/21 1900 03/17/21 2000 03/17/21 2030  BP: (!) 150/56 (!) 139/55 (!) 162/60 (!) 137/59  Pulse: 85 81 85 84  Resp: 15 (!) 21 (!) 22 (!) 25  Temp:      TempSrc:      SpO2: 98% 94% 96% 94%  Weight:      Height:        Physical Exam Constitutional:      General: She is not in acute distress. HENT:     Head: Normocephalic and atraumatic.     Mouth/Throat:     Mouth: Mucous membranes are dry.  Eyes:  Extraocular Movements: Extraocular movements intact.     Conjunctiva/sclera: Conjunctivae normal.  Cardiovascular:     Rate and Rhythm: Normal rate and regular rhythm.     Pulses: Normal pulses.  Pulmonary:     Effort: Pulmonary effort is normal. No respiratory distress.     Breath sounds: Normal breath sounds. No wheezing or rales.  Abdominal:     General: Bowel sounds are normal. There is no distension.     Palpations: Abdomen is soft.     Tenderness: There is abdominal tenderness. There is no guarding or rebound.     Comments: Bilateral lower quadrants tender to palpation  Musculoskeletal:        General: No swelling or tenderness.     Cervical back: Normal range of motion and neck supple.  Skin:    General: Skin is warm and dry.  Neurological:     General: No focal deficit present.     Mental Status: She is alert and oriented to person, place, and time.     Labs on Admission: I have personally reviewed following labs and imaging studies  CBC: Recent Labs  Lab 03/17/21 1825  WBC 11.6*  NEUTROABS 10.8*  HGB 14.7  HCT 45.4  MCV 98.1  PLT 542*   Basic Metabolic Panel: Recent Labs  Lab 03/17/21 1825  NA 138  K 3.1*  CL 106  CO2 23  GLUCOSE 156*  BUN 20  CREATININE 1.10*  CALCIUM 8.1*    GFR: Estimated Creatinine Clearance: 47.8 mL/min (A) (by C-G formula based on SCr of 1.1 mg/dL (H)). Liver Function Tests: Recent Labs  Lab 03/17/21 1825  AST 24  ALT 21  ALKPHOS 58  BILITOT 0.6  PROT 6.1*  ALBUMIN 3.3*   No results for input(s): LIPASE, AMYLASE in the last 168 hours. No results for input(s): AMMONIA in the last 168 hours. Coagulation Profile: Recent Labs  Lab 03/17/21 1825  INR 1.1   Cardiac Enzymes: No results for input(s): CKTOTAL, CKMB, CKMBINDEX, TROPONINI in the last 168 hours. BNP (last 3 results) No results for input(s): PROBNP in the last 8760 hours. HbA1C: No results for input(s): HGBA1C in the last 72 hours. CBG: No results for input(s): GLUCAP in the last 168 hours. Lipid Profile: No results for input(s): CHOL, HDL, LDLCALC, TRIG, CHOLHDL, LDLDIRECT in the last 72 hours. Thyroid Function Tests: No results for input(s): TSH, T4TOTAL, FREET4, T3FREE, THYROIDAB in the last 72 hours. Anemia Panel: No results for input(s): VITAMINB12, FOLATE, FERRITIN, TIBC, IRON, RETICCTPCT in the last 72 hours. Urine analysis:    Component Value Date/Time   COLORURINE YELLOW 12/05/2019 1417   APPEARANCEUR HAZY (A) 12/05/2019 1417   LABSPEC 1.016 12/05/2019 1417   PHURINE 5.0 12/05/2019 1417   GLUCOSEU NEGATIVE 12/05/2019 1417   HGBUR NEGATIVE 12/05/2019 1417   BILIRUBINUR NEGATIVE 12/05/2019 1417   BILIRUBINUR neg 01/22/2014 0926   KETONESUR NEGATIVE 12/05/2019 1417   PROTEINUR NEGATIVE 12/05/2019 1417   UROBILINOGEN 0.2 01/22/2014 0926   NITRITE NEGATIVE 12/05/2019 1417   LEUKOCYTESUR SMALL (A) 12/05/2019 1417    Radiological Exams on Admission: CT ABDOMEN PELVIS W CONTRAST  Result Date: 03/17/2021 CLINICAL DATA:  Abdominal pain, nausea and vomiting, weakness, diarrhea EXAM: CT ABDOMEN AND PELVIS WITH CONTRAST TECHNIQUE: Multidetector CT imaging of the abdomen and pelvis was performed using the standard protocol following bolus administration of  intravenous contrast. CONTRAST:  54mL OMNIPAQUE IOHEXOL 350 MG/ML SOLN COMPARISON:  None. FINDINGS: Lower chest: No acute pleural or parenchymal lung disease.  Small hiatal hernia. Hepatobiliary: No focal liver abnormality is seen. No gallstones, gallbladder wall thickening, or biliary dilatation. Pancreas: Unremarkable. No pancreatic ductal dilatation or surrounding inflammatory changes. Spleen: Mild splenomegaly measuring up 14 cm in craniocaudal length. No focal abnormality. Adrenals/Urinary Tract: No urinary tract calculi or obstructive uropathy. The kidneys enhance normally and symmetrically. The adrenals and bladder are unremarkable. Stomach/Bowel: No bowel obstruction or ileus. There is diffuse mural thickening of the colon compatible with inflammatory or infectious colitis. The appendix is surgically absent. Vascular/Lymphatic: Aortic atherosclerosis. No enlarged abdominal or pelvic lymph nodes. Reproductive: Status post hysterectomy. No adnexal masses. Other: No free fluid or free gas.  No abdominal wall hernia. Musculoskeletal: No acute or destructive bony lesions. Reconstructed images demonstrate no additional findings. IMPRESSION: 1. Diffuse colonic wall thickening consistent with inflammatory or infectious colitis. 2.  Aortic Atherosclerosis (ICD10-I70.0). 3. Small hiatal hernia. Electronically Signed   By: Randa Ngo M.D.   On: 03/17/2021 21:07   DG Chest Port 1 View  Result Date: 03/17/2021 CLINICAL DATA:  Sepsis, abdominal pain, nausea and vomiting, chills EXAM: PORTABLE CHEST 1 VIEW COMPARISON:  11/17/2020 FINDINGS: Single frontal view of the chest demonstrates an unremarkable cardiac silhouette. No acute airspace disease, effusion, or pneumothorax. No acute bony abnormalities. Hypertrophic changes are seen at the first costochondral junctions. IMPRESSION: 1. No acute intrathoracic process. Electronically Signed   By: Randa Ngo M.D.   On: 03/17/2021 18:31    EKG: Independently  reviewed.  Sinus rhythm, no significant change since prior tracing.  Assessment/Plan Principal Problem:   Colitis Active Problems:   Chronic obstructive pulmonary disease (HCC)   GERD (gastroesophageal reflux disease)   Sepsis ([place])   AKI (acute kidney injury) ([place])   Sepsis secondary to colitis Patient here with complaints of bilateral lower quadrant abdominal pain, nausea, vomiting, and profuse watery diarrhea.  She is febrile.  Labs showing WBC 11.6 and lactic acid 2.3 > 3.0. CT abdomen pelvis showing diffuse colonic wall thickening consistent with inflammatory or infectious colitis.  No grossly bloody stool seen on rectal exam in the ED. -Penicillin is listed as an allergy but patient able to tolerate cephalosporins.  Received ceftriaxone in the ED with no adverse reaction.  Continue ceftriaxone and Flagyl. -Tylenol as needed for fevers -Antiemetic as needed -Patient received 2 L fluid boluses in the ED.  Not tachycardic or hypotensive.  Continue IV fluid hydration. -C. difficile PCR and GI pathogen panel, enteric precautions -Continue to trend lactate and WBC count -Blood culture x2 drawn and currently pending -She has not vomited in the ED.  Clear liquid diet, advance as tolerated.  Mild AKI Likely prerenal from dehydration. BUN 20, creatinine 1.1 (baseline 0.8).  -IV fluid hydration.  Monitor renal function and urine output.  Avoid nephrotoxic agents/hold home losartan.  Mild thrombocytopenia Possibly due to sepsis.  No signs of active bleeding. -Continue to monitor  Mild hypokalemia Likely due to poor oral intake. -Monitor potassium and magnesium levels, replenish as needed.  Hypertension Stable. -Hold antihypertensives at this time in the setting of sepsis.  COPD: Stable.  No signs of acute exacerbation. Hyperlipidemia GERD Anxiety and depression -Resume home meds after pharmacy med rec is done.  DVT prophylaxis: SCDs Code Status: Patient wishes to be full  code. Family Communication: No family available at this time. Disposition Plan: Status is: Inpatient  Remains inpatient appropriate because:Inpatient level of care appropriate due to severity of illness  Dispo: The patient is from: Home  Anticipated d/c is to: Home              Patient currently is not medically stable to d/c.   Difficult to place patient No  Level of care: Level of care: Telemetry Medical  The medical decision making on this patient was of high complexity and the patient is at high risk for clinical deterioration, therefore this is a level 3 visit.  Shela Leff MD [facility]  If 7PM-7AM, please contact night-coverage [email or web address]  03/17/2021, 11:24 PM

## 2021-03-17 NOTE — ED Notes (Signed)
Patient transported to CT 

## 2021-03-17 NOTE — Sepsis Progress Note (Signed)
Notified bedside nurse of need to draw first lactic acid.

## 2021-03-18 DIAGNOSIS — K529 Noninfective gastroenteritis and colitis, unspecified: Secondary | ICD-10-CM | POA: Diagnosis not present

## 2021-03-18 LAB — BASIC METABOLIC PANEL
Anion gap: 7 (ref 5–15)
BUN: 14 mg/dL (ref 8–23)
CO2: 21 mmol/L — ABNORMAL LOW (ref 22–32)
Calcium: 7.9 mg/dL — ABNORMAL LOW (ref 8.9–10.3)
Chloride: 110 mmol/L (ref 98–111)
Creatinine, Ser: 0.88 mg/dL (ref 0.44–1.00)
GFR, Estimated: >60 mL/min (ref >60)
Glucose, Bld: 120 mg/dL — ABNORMAL HIGH (ref 70–99)
Potassium: 3.3 mmol/L — ABNORMAL LOW (ref 3.5–5.1)
Sodium: 138 mmol/L (ref 135–145)

## 2021-03-18 LAB — C DIFFICILE QUICK SCREEN W PCR REFLEX
C Diff antigen: NEGATIVE
C Diff interpretation: NOT DETECTED
C Diff toxin: NEGATIVE

## 2021-03-18 LAB — CBC
HCT: 41.8 % (ref 36.0–46.0)
Hemoglobin: 13.7 g/dL (ref 12.0–15.0)
MCH: 32 pg (ref 26.0–34.0)
MCHC: 32.8 g/dL (ref 30.0–36.0)
MCV: 97.7 fL (ref 80.0–100.0)
Platelets: 107 10*3/uL — ABNORMAL LOW (ref 150–400)
RBC: 4.28 MIL/uL (ref 3.87–5.11)
RDW: 14.3 % (ref 11.5–15.5)
WBC: 7.2 10*3/uL (ref 4.0–10.5)
nRBC: 0 % (ref 0.0–0.2)

## 2021-03-18 LAB — LACTIC ACID, PLASMA: Lactic Acid, Venous: 0.9 mmol/L (ref 0.5–1.9)

## 2021-03-18 LAB — RESP PANEL BY RT-PCR (FLU A&B, COVID) ARPGX2
Influenza A by PCR: NEGATIVE
Influenza B by PCR: NEGATIVE
SARS Coronavirus 2 by RT PCR: NEGATIVE

## 2021-03-18 LAB — GLUCOSE, CAPILLARY: Glucose-Capillary: 122 mg/dL — ABNORMAL HIGH (ref 70–99)

## 2021-03-18 MED ORDER — FLUTICASONE FUROATE-VILANTEROL 100-25 MCG/INH IN AEPB
1.0000 | INHALATION_SPRAY | Freq: Every day | RESPIRATORY_TRACT | Status: DC
  Administered 2021-03-18 – 2021-03-20 (×3): 1 via RESPIRATORY_TRACT
  Filled 2021-03-18: qty 28

## 2021-03-18 MED ORDER — METHOCARBAMOL 500 MG PO TABS: 500.0000 mg | ORAL_TABLET | Freq: Three times a day (TID) | ORAL | Status: DC | PRN

## 2021-03-18 MED ORDER — UMECLIDINIUM BROMIDE 62.5 MCG/INH IN AEPB
1.0000 | INHALATION_SPRAY | Freq: Every day | RESPIRATORY_TRACT | Status: DC
  Administered 2021-03-18 – 2021-03-20 (×3): 1 via RESPIRATORY_TRACT
  Filled 2021-03-18 (×2): qty 7

## 2021-03-18 MED ORDER — OXYCODONE-ACETAMINOPHEN 5-325 MG PO TABS
1.0000 | ORAL_TABLET | Freq: Three times a day (TID) | ORAL | Status: DC | PRN
  Administered 2021-03-20: 2 via ORAL
  Filled 2021-03-18: qty 2

## 2021-03-18 MED ORDER — GABAPENTIN 300 MG PO CAPS
300.0000 mg | ORAL_CAPSULE | Freq: Every morning | ORAL | Status: DC
  Administered 2021-03-18 – 2021-03-20 (×3): 300 mg via ORAL
  Filled 2021-03-18 (×3): qty 1

## 2021-03-18 MED ORDER — BUPROPION HCL ER (XL) 150 MG PO TB24
150.0000 mg | ORAL_TABLET | Freq: Every morning | ORAL | Status: DC
  Administered 2021-03-18 – 2021-03-20 (×3): 150 mg via ORAL
  Filled 2021-03-18 (×3): qty 1

## 2021-03-18 MED ORDER — FLUTICASONE PROPIONATE 50 MCG/ACT NA SUSP
2.0000 | Freq: Every day | NASAL | Status: DC
  Administered 2021-03-19 – 2021-03-20 (×2): 2 via NASAL
  Filled 2021-03-18: qty 16

## 2021-03-18 MED ORDER — POTASSIUM CHLORIDE CRYS ER 20 MEQ PO TBCR
40.0000 meq | EXTENDED_RELEASE_TABLET | Freq: Every day | ORAL | Status: DC
  Administered 2021-03-18 – 2021-03-19 (×2): 40 meq via ORAL
  Filled 2021-03-18 (×2): qty 2

## 2021-03-18 MED ORDER — POTASSIUM CHLORIDE 2 MEQ/ML IV SOLN
INTRAVENOUS | Status: DC
  Filled 2021-03-18 (×8): qty 1000

## 2021-03-18 MED ORDER — ACYCLOVIR 200 MG PO CAPS
200.0000 mg | ORAL_CAPSULE | Freq: Every day | ORAL | Status: DC
  Administered 2021-03-18 – 2021-03-19 (×2): 200 mg via ORAL
  Filled 2021-03-18 (×2): qty 1

## 2021-03-18 MED ORDER — METRONIDAZOLE 500 MG/100ML IV SOLN
500.0000 mg | Freq: Two times a day (BID) | INTRAVENOUS | Status: DC
  Administered 2021-03-18 – 2021-03-19 (×3): 500 mg via INTRAVENOUS
  Filled 2021-03-18 (×3): qty 100

## 2021-03-18 MED ORDER — ALBUTEROL SULFATE (2.5 MG/3ML) 0.083% IN NEBU: 3.0000 mL | INHALATION_SOLUTION | Freq: Four times a day (QID) | RESPIRATORY_TRACT | Status: DC | PRN

## 2021-03-18 MED ORDER — GABAPENTIN 300 MG PO CAPS
600.0000 mg | ORAL_CAPSULE | Freq: Every day | ORAL | Status: DC
  Administered 2021-03-18 – 2021-03-19 (×2): 600 mg via ORAL
  Filled 2021-03-18 (×2): qty 2

## 2021-03-18 MED ORDER — SERTRALINE HCL 100 MG PO TABS
200.0000 mg | ORAL_TABLET | Freq: Every day | ORAL | Status: DC
  Administered 2021-03-18 – 2021-03-20 (×3): 200 mg via ORAL
  Filled 2021-03-18 (×3): qty 2

## 2021-03-18 MED ORDER — POTASSIUM CHLORIDE 10 MEQ/100ML IV SOLN
10.0000 meq | Freq: Once | INTRAVENOUS | Status: AC
  Administered 2021-03-18: 10 meq via INTRAVENOUS
  Filled 2021-03-18: qty 100

## 2021-03-18 MED ORDER — SACCHAROMYCES BOULARDII 250 MG PO CAPS
250.0000 mg | ORAL_CAPSULE | Freq: Two times a day (BID) | ORAL | Status: AC
  Administered 2021-03-18 – 2021-03-19 (×2): 250 mg via ORAL
  Filled 2021-03-18 (×2): qty 1

## 2021-03-18 MED ORDER — LOPERAMIDE HCL 2 MG PO CAPS
2.0000 mg | ORAL_CAPSULE | Freq: Once | ORAL | Status: AC
  Administered 2021-03-18: 2 mg via ORAL
  Filled 2021-03-18: qty 1

## 2021-03-18 MED ORDER — LORAZEPAM 0.5 MG PO TABS
0.5000 mg | ORAL_TABLET | Freq: Two times a day (BID) | ORAL | Status: DC | PRN
  Administered 2021-03-18 – 2021-03-19 (×2): 0.5 mg via ORAL
  Filled 2021-03-18 (×2): qty 1

## 2021-03-18 MED ORDER — SODIUM CHLORIDE 0.9 % IV SOLN
INTRAVENOUS | Status: DC | PRN
  Administered 2021-03-18: 250 mL via INTRAVENOUS

## 2021-03-18 NOTE — Progress Notes (Signed)
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 Nancy Nordmann  Presented to ED 9/20 abdominal pain nausea vomiting chills and diarrhea in addition to vomiting ?  Bright red blood additionally with 3/10 LLQ pain Rectal exam Brown stool on glove  Hospital-Problem based course  Likely infectious colitis -Undifferentiated rule out C. difficile-await culture/GI pathogen panel, follow blood culture -Graduate diet to full liquid if able to tolerate -Continuing ceftriaxone/Flagyl -Underlying as IBS and chronic Bentyl followed by gastroenterology, no acute indication to consult at this time  Mild AKI on admission with hypokalemia, baseline creatinine 0.8 - Holding Cozaar - Continue LR with 20 of K and add K. Dur 40x1  HTN - Blood pressure is actually high despite sepsis so we will reimplement some meds in the next 24 to 48 hours  COPD/asthma and smoker -Resume Trelegy Ellipta albuterol 2 puffs every 6 as needed and Flonase  Chronic pain -Continue Robaxin 500 3 times daily gabapentin 300-600 per home schedule hold ibuprofen, continue oxycodone 1 to 2 tablet every 8 as needed pain  Depression - Continue sertraline 200 daily, Ativan 0.5 twice daily as needed   DVT prophylaxis: SCD Code Status: Full Family Communication: None present Disposition:  Status is: Inpatient  Remains inpatient appropriate because:Hemodynamically unstable, Ongoing diagnostic testing needed not appropriate for outpatient work up, Unsafe d/c plan, and IV treatments appropriate due to intensity of illness or inability to take PO  Dispo: The patient is from: Home              Anticipated d/c is to: Home               Patient currently is not medically stable to d/c.   Difficult to place patient No       Consultants:  None at present  Procedures: No  Antimicrobials: Cefepime Flagyl vancomycin   Subjective:  Multiple copious episodes of stools which seem to slow down over the past several hours Nauseous but wants to eat and feels we can graduate her diet No vomit Still some tenderness in left lower quadrant No recent antibiotics no ill contacts no small children who have been sick   Objective: Vitals:   03/18/21 0358 03/18/21 0600 03/18/21 0601 03/18/21 0845  BP:  (!) 161/77  (!) 167/64  Pulse:  60  60  Resp:  (!) 21  (!) 24  Temp: (!) 101 F (38.3 C)  99.3 F (37.4 C) 98.9 F (37.2 C)  TempSrc: Oral  Oral Oral  SpO2:  92%  95%  Weight:      Height:        Intake/Output Summary (Last 24 hours) at 03/18/2021 1002 Last data filed at 03/17/2021 2035 Gross per 24 hour  Intake 1700 ml  Output --  Net 1700 ml   Filed Weights   03/17/21 1723  Weight: 86.2 kg    Examination:  Awake coherent no distress EOMI NCAT no focal deficit S1-S2 no murmur Abdomen obese distended slight pain in left lower quadrant no rebound no guarding ROM intact no lower extremity edema Moves all 4 limbs with good power, reflexes and gait deferred   Data Reviewed: personally reviewed   CBC    Component Value Date/Time  WBC 7.2 03/18/2021 0354   RBC 4.28 03/18/2021 0354   HGB 13.7 03/18/2021 0354   HGB 13.6 07/19/2019 0927   HCT 41.8 03/18/2021 0354   HCT 42.6 07/19/2019 0927   PLT 107 (L) 03/18/2021 0354   PLT 264 07/19/2019 0927   MCV 97.7 03/18/2021 0354   MCV 93 07/19/2019 0927   MCH 32.0 03/18/2021 0354   MCHC 32.8 03/18/2021 0354   RDW 14.3 03/18/2021 0354   RDW 13.8 07/19/2019 0927   LYMPHSABS 0.5 (L) 03/17/2021 1825   LYMPHSABS 1.3 07/19/2019 0927   MONOABS 0.3 03/17/2021 1825   EOSABS 0.0 03/17/2021 1825   EOSABS 0.1 07/19/2019 0927   BASOSABS 0.0 03/17/2021 1825    BASOSABS 0.1 07/19/2019 0927   CMP Latest Ref Rng & Units 03/18/2021 03/17/2021 05/01/2020  Glucose 70 - 99 mg/dL 120(H) 156(H) 120(H)  BUN 8 - 23 mg/dL 14 20 17   Creatinine 0.44 - 1.00 mg/dL 0.88 1.10(H) 0.87  Sodium 135 - 145 mmol/L 138 138 143  Potassium 3.5 - 5.1 mmol/L 3.3(L) 3.1(L) 4.2  Chloride 98 - 111 mmol/L 110 106 108  CO2 22 - 32 mmol/L 21(L) 23 29  Calcium 8.9 - 10.3 mg/dL 7.9(L) 8.1(L) 8.9  Total Protein 6.5 - 8.1 g/dL - 6.1(L) 6.5  Total Bilirubin 0.3 - 1.2 mg/dL - 0.6 0.4  Alkaline Phos 38 - 126 U/L - 58 68  AST 15 - 41 U/L - 24 25  ALT 0 - 44 U/L - 21 20     Radiology Studies: CT ABDOMEN PELVIS W CONTRAST  Result Date: 03/17/2021 CLINICAL DATA:  Abdominal pain, nausea and vomiting, weakness, diarrhea EXAM: CT ABDOMEN AND PELVIS WITH CONTRAST TECHNIQUE: Multidetector CT imaging of the abdomen and pelvis was performed using the standard protocol following bolus administration of intravenous contrast. CONTRAST:  36mL OMNIPAQUE IOHEXOL 350 MG/ML SOLN COMPARISON:  None. FINDINGS: Lower chest: No acute pleural or parenchymal lung disease. Small hiatal hernia. Hepatobiliary: No focal liver abnormality is seen. No gallstones, gallbladder wall thickening, or biliary dilatation. Pancreas: Unremarkable. No pancreatic ductal dilatation or surrounding inflammatory changes. Spleen: Mild splenomegaly measuring up 14 cm in craniocaudal length. No focal abnormality. Adrenals/Urinary Tract: No urinary tract calculi or obstructive uropathy. The kidneys enhance normally and symmetrically. The adrenals and bladder are unremarkable. Stomach/Bowel: No bowel obstruction or ileus. There is diffuse mural thickening of the colon compatible with inflammatory or infectious colitis. The appendix is surgically absent. Vascular/Lymphatic: Aortic atherosclerosis. No enlarged abdominal or pelvic lymph nodes. Reproductive: Status post hysterectomy. No adnexal masses. Other: No free fluid or free gas.  No  abdominal wall hernia. Musculoskeletal: No acute or destructive bony lesions. Reconstructed images demonstrate no additional findings. IMPRESSION: 1. Diffuse colonic wall thickening consistent with inflammatory or infectious colitis. 2.  Aortic Atherosclerosis (ICD10-I70.0). 3. Small hiatal hernia. Electronically Signed   By: Randa Ngo M.D.   On: 03/17/2021 21:07   DG Chest Port 1 View  Result Date: 03/17/2021 CLINICAL DATA:  Sepsis, abdominal pain, nausea and vomiting, chills EXAM: PORTABLE CHEST 1 VIEW COMPARISON:  11/17/2020 FINDINGS: Single frontal view of the chest demonstrates an unremarkable cardiac silhouette. No acute airspace disease, effusion, or pneumothorax. No acute bony abnormalities. Hypertrophic changes are seen at the first costochondral junctions. IMPRESSION: 1. No acute intrathoracic process. Electronically Signed   By: Randa Ngo M.D.   On: 03/17/2021 18:31     Scheduled Meds: Continuous Infusions:  cefTRIAXone (ROCEPHIN)  IV Stopped (03/17/21 2017)   lactated ringers with kcl  100 mL/hr at 03/18/21 0848   metronidazole       LOS: 1 day   Time spent: [place], MD [facility] To contact the attending provider between 7A-7P or the covering provider during after hours 7P-7A, please log into the web site [email or web address] and access using universal [place] password for that web site. If you do not have the password, please call the hospital operator.  03/18/2021, 10:02 AM

## 2021-03-18 NOTE — Hospital Course (Signed)
70 year old white female community dwelling sleep apnea not on CPAP COPD/asthma (smoker) reflux, BMI 34 mild obesity 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 Nancy Nordmann  Presented to ED 9/20 abdominal pain nausea vomiting chills and diarrhea in addition to vomiting ?  Bright red blood additionally with 3/10 LLQ pain Rectal exam Brown stool on glove  Potassium 3.1 replaced to 3.3 BUN/creatinine 20/1.1-->14/0.8 Lactic acid peak 3.0-->0.9 White count 11.6-->7.2 and 110->107 CT abdomen pelvis showing colonic wall thickening possible infectious colitis UA small leukocytes negative nitrates many bacteria but unclear how the specimen was called

## 2021-03-19 ENCOUNTER — Encounter (HOSPITAL_COMMUNITY): Payer: Self-pay | Admitting: Internal Medicine

## 2021-03-19 DIAGNOSIS — K529 Noninfective gastroenteritis and colitis, unspecified: Secondary | ICD-10-CM | POA: Diagnosis not present

## 2021-03-19 LAB — GASTROINTESTINAL PANEL BY PCR, STOOL (REPLACES STOOL CULTURE)

## 2021-03-19 LAB — GLUCOSE, CAPILLARY
Glucose-Capillary: 156 mg/dL — ABNORMAL HIGH (ref 70–99)
Glucose-Capillary: 155 mg/dL — ABNORMAL HIGH (ref 70–99)
Glucose-Capillary: 106 mg/dL — ABNORMAL HIGH (ref 70–99)

## 2021-03-19 LAB — CBC
HCT: 40.4 % (ref 36.0–46.0)
Hemoglobin: 13.5 g/dL (ref 12.0–15.0)
MCH: 32 pg (ref 26.0–34.0)
MCHC: 33.4 g/dL (ref 30.0–36.0)
MCV: 95.7 fL (ref 80.0–100.0)
Platelets: 99 10*3/uL — ABNORMAL LOW (ref 150–400)
RBC: 4.22 MIL/uL (ref 3.87–5.11)
RDW: 13.9 % (ref 11.5–15.5)
WBC: 3.8 10*3/uL — ABNORMAL LOW (ref 4.0–10.5)
nRBC: 0 % (ref 0.0–0.2)

## 2021-03-19 LAB — BASIC METABOLIC PANEL
Anion gap: 8 (ref 5–15)
BUN: 13 mg/dL (ref 8–23)
CO2: 21 mmol/L — ABNORMAL LOW (ref 22–32)
Calcium: 8.2 mg/dL — ABNORMAL LOW (ref 8.9–10.3)
Chloride: 111 mmol/L (ref 98–111)
Creatinine, Ser: 0.78 mg/dL (ref 0.44–1.00)
GFR, Estimated: >60 mL/min (ref >60)
Glucose, Bld: 93 mg/dL (ref 70–99)
Potassium: 3.2 mmol/L — ABNORMAL LOW (ref 3.5–5.1)
Sodium: 140 mmol/L (ref 135–145)

## 2021-03-19 MED ORDER — POTASSIUM CHLORIDE CRYS ER 20 MEQ PO TBCR
40.0000 meq | EXTENDED_RELEASE_TABLET | Freq: Two times a day (BID) | ORAL | Status: DC
  Administered 2021-03-19 – 2021-03-20 (×2): 40 meq via ORAL
  Filled 2021-03-19 (×2): qty 2

## 2021-03-19 MED ORDER — LOSARTAN POTASSIUM 50 MG PO TABS
100.0000 mg | ORAL_TABLET | Freq: Every day | ORAL | Status: DC
  Administered 2021-03-19 – 2021-03-20 (×2): 100 mg via ORAL
  Filled 2021-03-19 (×2): qty 2

## 2021-03-19 NOTE — Progress Notes (Signed)
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 Nancy Nordmann  Presented to ED 9/20 abdominal pain nausea vomiting chills and diarrhea in addition to vomiting ?  Bright red blood additionally with 3/10 LLQ pain Rectal exam Brown stool on glove  Hospital-Problem based course  Likely infectious colitis, etiology unclear - C. Difficile negative-await culture/GI pathogen panel, follow blood culture -Graduate diet regular -Continuing ceftriaxone/Flagyl until pathogen panel results -Underlying as IBS and chronic Bentyl followed by gastroenterology, no acute indication to consult at this time  Mild AKI on admission with hypokalemia, baseline creatinine 0.8 - Holding Cozaar - Continue LR with 20 of K ,add K. Dur 40 bid, check am Mag  HTN - Blood pressure elevated, resumed losartan 100  COPD/asthma and smoker -Resume Trelegy Ellipta albuterol 2 puffs every 6 as needed and Flonase  Chronic pain -Continue Robaxin 500 3 times daily gabapentin 300-600 per home schedule hold ibuprofen, continue oxycodone 1 to 2 tablet every 8 as needed pain  Depression - Continue sertraline 200 daily, Ativan 0.5 twice daily as needed  Mild TCP, mild Leukopenia -probably from infection -continue to treat and monitor trends  DVT prophylaxis: SCD Code Status: Full Family Communication: None present Disposition:  Status is: Inpatient  Remains inpatient appropriate because:Hemodynamically unstable, Ongoing diagnostic testing needed not appropriate for outpatient work up, Unsafe d/c plan, and IV treatments appropriate due to intensity of illness or inability to take PO  Dispo: The patient is from:  Home              Anticipated d/c is to: Home              Patient currently is not medically stable to d/c.   Difficult to place patient No   Consultants:  None at present  Procedures: No  Antimicrobials: Cefepime Flagyl vancomycin   Subjective:  Only 1 stool overnight-watery no blood Still somewhat nauseous No cp no fever Feels overall slightly improved   Objective: Vitals:   03/18/21 2300 03/19/21 0441 03/19/21 0831 03/19/21 0943  BP: (!) 157/76 (!) 160/68  (!) 153/58  Pulse: 61 62  63  Resp: 20 18  19   Temp: 98.3 F (36.8 C) 98.6 F (37 C)  97.9 F (36.6 C)  TempSrc: Oral Oral  Oral  SpO2: 95% 93% 94% 94%  Weight:      Height:        Intake/Output Summary (Last 24 hours) at 03/19/2021 1238 Last data filed at 03/19/2021 0327 Gross per 24 hour  Intake 1969.2 ml  Output --  Net 1969.2 ml    Filed Weights   03/17/21 1723  Weight: 86.2 kg    Examination:  coherent no distress EOMI NCAT no ict no pallor S1-S2 no murmur Abdomen obese distended slight pain in left lower quadrant and RLQ as well ROM intact no lower extremity edema Moves all 4 limbs with good power, reflexes and gait deferred   Data Reviewed: personally reviewed   CBC    Component Value Date/Time   WBC 3.8 (L) 03/19/2021 0639   RBC 4.22 03/19/2021 0639   HGB 13.5 03/19/2021 0639   HGB 13.6 07/19/2019 0927   HCT 40.4 03/19/2021 0639   HCT 42.6 07/19/2019 0927  PLT 99 (L) 03/19/2021 0639   PLT 264 07/19/2019 0927   MCV 95.7 03/19/2021 0639   MCV 93 07/19/2019 0927   MCH 32.0 03/19/2021 0639   MCHC 33.4 03/19/2021 0639   RDW 13.9 03/19/2021 0639   RDW 13.8 07/19/2019 0927   LYMPHSABS 0.5 (L) 03/17/2021 1825   LYMPHSABS 1.3 07/19/2019 0927   MONOABS 0.3 03/17/2021 1825   EOSABS 0.0 03/17/2021 1825   EOSABS 0.1 07/19/2019 0927   BASOSABS 0.0 03/17/2021 1825   BASOSABS 0.1 07/19/2019 0927   CMP Latest Ref Rng & Units 03/19/2021 03/18/2021 03/17/2021  Glucose 70 - 99 mg/dL 93  120(H) 156(H)  BUN 8 - 23 mg/dL 13 14 20   Creatinine 0.44 - 1.00 mg/dL 0.78 0.88 1.10(H)  Sodium 135 - 145 mmol/L 140 138 138  Potassium 3.5 - 5.1 mmol/L 3.2(L) 3.3(L) 3.1(L)  Chloride 98 - 111 mmol/L 111 110 106  CO2 22 - 32 mmol/L 21(L) 21(L) 23  Calcium 8.9 - 10.3 mg/dL 8.2(L) 7.9(L) 8.1(L)  Total Protein 6.5 - 8.1 g/dL - - 6.1(L)  Total Bilirubin 0.3 - 1.2 mg/dL - - 0.6  Alkaline Phos 38 - 126 U/L - - 58  AST 15 - 41 U/L - - 24  ALT 0 - 44 U/L - - 21     Radiology Studies: CT ABDOMEN PELVIS W CONTRAST  Result Date: 03/17/2021 CLINICAL DATA:  Abdominal pain, nausea and vomiting, weakness, diarrhea EXAM: CT ABDOMEN AND PELVIS WITH CONTRAST TECHNIQUE: Multidetector CT imaging of the abdomen and pelvis was performed using the standard protocol following bolus administration of intravenous contrast. CONTRAST:  77mL OMNIPAQUE IOHEXOL 350 MG/ML SOLN COMPARISON:  None. FINDINGS: Lower chest: No acute pleural or parenchymal lung disease. Small hiatal hernia. Hepatobiliary: No focal liver abnormality is seen. No gallstones, gallbladder wall thickening, or biliary dilatation. Pancreas: Unremarkable. No pancreatic ductal dilatation or surrounding inflammatory changes. Spleen: Mild splenomegaly measuring up 14 cm in craniocaudal length. No focal abnormality. Adrenals/Urinary Tract: No urinary tract calculi or obstructive uropathy. The kidneys enhance normally and symmetrically. The adrenals and bladder are unremarkable. Stomach/Bowel: No bowel obstruction or ileus. There is diffuse mural thickening of the colon compatible with inflammatory or infectious colitis. The appendix is surgically absent. Vascular/Lymphatic: Aortic atherosclerosis. No enlarged abdominal or pelvic lymph nodes. Reproductive: Status post hysterectomy. No adnexal masses. Other: No free fluid or free gas.  No abdominal wall hernia. Musculoskeletal: No acute or destructive bony lesions. Reconstructed images demonstrate no additional  findings. IMPRESSION: 1. Diffuse colonic wall thickening consistent with inflammatory or infectious colitis. 2.  Aortic Atherosclerosis (ICD10-I70.0). 3. Small hiatal hernia. Electronically Signed   By: Randa Ngo M.D.   On: 03/17/2021 21:07   DG Chest Port 1 View  Result Date: 03/17/2021 CLINICAL DATA:  Sepsis, abdominal pain, nausea and vomiting, chills EXAM: PORTABLE CHEST 1 VIEW COMPARISON:  11/17/2020 FINDINGS: Single frontal view of the chest demonstrates an unremarkable cardiac silhouette. No acute airspace disease, effusion, or pneumothorax. No acute bony abnormalities. Hypertrophic changes are seen at the first costochondral junctions. IMPRESSION: 1. No acute intrathoracic process. Electronically Signed   By: Randa Ngo M.D.   On: 03/17/2021 18:31     Scheduled Meds:  acyclovir  200 mg Oral Daily   buPROPion  150 mg Oral q morning   fluticasone  2 spray Each Nare Daily   fluticasone furoate-vilanterol  1 puff Inhalation Daily   gabapentin  300 mg Oral q AM   gabapentin  600 mg Oral QHS  potassium chloride  40 mEq Oral Daily   sertraline  200 mg Oral Daily   umeclidinium bromide  1 puff Inhalation Daily   Continuous Infusions:  sodium chloride 250 mL (03/18/21 1819)   cefTRIAXone (ROCEPHIN)  IV 2 g (03/18/21 1822)   lactated ringers with kcl 100 mL/hr at 03/19/21 0617   metronidazole 500 mg (03/19/21 1051)     LOS: 2 days   Time spent: [place], MD [facility] To contact the attending provider between 7A-7P or the covering provider during after hours 7P-7A, please log into the web site [email or web address] and access using universal [place] password for that web site. If you do not have the password, please call the hospital operator.  03/19/2021, 12:38 PM

## 2021-03-20 DIAGNOSIS — K529 Noninfective gastroenteritis and colitis, unspecified: Secondary | ICD-10-CM | POA: Diagnosis not present

## 2021-03-20 LAB — CBC
HCT: 38.7 % (ref 36.0–46.0)
Hemoglobin: 12.8 g/dL (ref 12.0–15.0)
MCH: 31.4 pg (ref 26.0–34.0)
MCHC: 33.1 g/dL (ref 30.0–36.0)
MCV: 94.9 fL (ref 80.0–100.0)
Platelets: 130 10*3/uL — ABNORMAL LOW (ref 150–400)
RBC: 4.08 MIL/uL (ref 3.87–5.11)
RDW: 14 % (ref 11.5–15.5)
WBC: 4.6 10*3/uL (ref 4.0–10.5)
nRBC: 0 % (ref 0.0–0.2)

## 2021-03-20 LAB — BASIC METABOLIC PANEL
Anion gap: 6 (ref 5–15)
BUN: 11 mg/dL (ref 8–23)
CO2: 22 mmol/L (ref 22–32)
Calcium: 8.2 mg/dL — ABNORMAL LOW (ref 8.9–10.3)
Chloride: 113 mmol/L — ABNORMAL HIGH (ref 98–111)
Creatinine, Ser: 0.76 mg/dL (ref 0.44–1.00)
GFR, Estimated: >60 mL/min (ref >60)
Glucose, Bld: 85 mg/dL (ref 70–99)
Potassium: 3.6 mmol/L (ref 3.5–5.1)
Sodium: 141 mmol/L (ref 135–145)

## 2021-03-20 LAB — MAGNESIUM: Magnesium: 1.9 mg/dL (ref 1.7–2.4)

## 2021-03-20 MED ORDER — METRONIDAZOLE 500 MG PO TABS
500.0000 mg | ORAL_TABLET | Freq: Two times a day (BID) | ORAL | Status: DC
  Administered 2021-03-20: 500 mg via ORAL
  Filled 2021-03-20: qty 1

## 2021-03-20 MED ORDER — METRONIDAZOLE 500 MG PO TABS: 500.0000 mg | ORAL_TABLET | Freq: Two times a day (BID) | ORAL | 0 refills | Status: AC

## 2021-03-20 MED ORDER — CIPROFLOXACIN HCL 500 MG PO TABS: 500.0000 mg | ORAL_TABLET | Freq: Two times a day (BID) | ORAL | 0 refills | Status: AC

## 2021-03-20 MED ORDER — CIPROFLOXACIN HCL 500 MG PO TABS
500.0000 mg | ORAL_TABLET | Freq: Two times a day (BID) | ORAL | Status: DC
  Administered 2021-03-20: 500 mg via ORAL
  Filled 2021-03-20: qty 1

## 2021-03-20 NOTE — Discharge Summary (Signed)
Physician Discharge Summary  Tiffany Murphy UUV:[id number] DOB: 01-21-1951 DOA: 03/17/2021  PCP: Cipriano Mile, NP  Admit date: 03/17/2021 Discharge date: 03/20/2021  Time spent: 36 minutes  Recommendations for Outpatient Follow-up:  Complete ciprofloxacin Flagyl in outpatient ending on 03/26/2021 Needs outpatient further follow-up with gastroenterology for IBS symptoms Continue smoking cessation counseling  Discharge Diagnoses:  MAIN problem for hospitalization   Infectious colitis  Please see below for itemized issues addressed in [place]- refer to other progress notes for clarity if needed  Discharge Condition: Improved  Diet recommendation: Heart healthy  Filed Weights   03/17/21 1723  Weight: 86.2 kg    History of present illness:  70 year old white female community dwelling sleep apnea not on CPAP COPD/asthma (smoker) reflux, BMI 34 mild obesity 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 Nancy Nordmann   Presented to ED 9/20 abdominal pain nausea vomiting chills and diarrhea in addition to vomiting ?  Bright red blood additionally with 3/10 LLQ pain Rectal exam Brown stool on glove  Hospital Course:  Likely infectious colitis, etiology unclear - C. Difficile, GI pathogen panel negative -Graduate diet regular - Ceftriaxone/Flagyl transitioned on discharge Cipro Flagyl to complete 10 days ending 10/7 -Underlying as IBS and chronic Bentyl followed by gastroenterology, no acute indication to consult at this time   Mild AKI on admission with hypokalemia, baseline creatinine 0.8 - Initially Cozaar was held but this was resumed as she started eating and drinking was replaced with IV potassium in addition to p.o. - Replaced potassium IV and p.o. and this was resolved on discharge therefore held   HTN - Blood pressure elevated, resumed losartan 100   COPD/asthma and smoker -Resume Trelegy  Ellipta albuterol 2 puffs every 6 as needed and Flonase   Chronic pain -Continue Robaxin 500 3 times daily gabapentin 300-600-on discharge did hold ibuprofen, continue oxycodone 1 to 2 tablet every 8 as needed pain  Depression - Continue sertraline 200 daily, Ativan 0.5 twice daily as needed   Mild TCP, mild Leukopenia -probably from infection, resolved at time of discharge   Discharge Exam: Vitals:   03/19/21 2108 03/20/21 0325  BP: (!) 168/68 (!) 170/68  Pulse: 67 66  Resp: 17 19  Temp: 98.1 F (36.7 C) 98.6 F (37 C)  SpO2: 95% 94%    Subj on day of d/c   Awake coherent  Tolerating some liquid diet but not really wanting to eat solids because she does not like the food, is able to eat solids without nausea Mild discomfort in abdomen but improved since admission Pain lower quadrant is about 3-4 on 10 She is only had 3 diarrhea stools yesterday  General Exam on discharge  Coherent pleasant EOMI NCAT no focal deficit CTA B no added sound no rales no rhonchi ROM intact moving 4 limbs equally without focal deficit Abdomen soft slight tenderness on deep palpation in the left lower quadrant No lower extremity edema Mood euthymic coherent  Discharge Instructions   Discharge Instructions     Diet - low sodium heart healthy   Complete by: As directed    Discharge instructions   Complete by: As directed    You were diagnosed with a nonspecific diarrhea-we did not find any specific bug bite because of your fevers and presentation feel it was prudent to complete treatment Complete antibiotics as directed for total of 10 days ending on 10/7 these have been called into your pharmacy [facility]  Would recommend graduating your diet to be able to eat solids as this will have an impact on your diarrhea Please follow-up with your primary care physician in about 1 week or so for further discussions and please follow-up with gastroenterology who was seen you in the past for your IBS    Increase activity slowly   Complete by: As directed       Allergies as of 03/20/2021       Reactions   Penicillins Hives, Shortness Of Breath   Lisinopril Other (See Comments)   Makes feel bad   Lovastatin    Cramps in legs and feet   Other    Fragrances in soaps, perfumes, shampoos, conditioners        Medication List     STOP taking these medications    acyclovir 200 MG capsule Commonly known as: ZOVIRAX   celecoxib 400 MG capsule Commonly known as: CELEBREX   doxycycline 100 MG tablet Commonly known as: VIBRA-TABS   ibuprofen 200 MG tablet Commonly known as: ADVIL   ibuprofen 800 MG tablet Commonly known as: ADVIL   omeprazole 40 MG capsule Commonly known as: PRILOSEC       TAKE these medications    albuterol 108 (90 Base) MCG/ACT inhaler Commonly known as: VENTOLIN HFA Inhale 2 puffs into the lungs every 6 (six) hours as needed for wheezing.   buPROPion 150 MG 24 hr tablet Commonly known as: WELLBUTRIN XL Take 150 mg by mouth every morning.   ciprofloxacin 500 MG tablet Commonly known as: CIPRO Take 1 tablet (500 mg total) by mouth 2 (two) times daily for 6 days.   Fish Oil 1000 MG Cpdr Take 1,000 mg by mouth daily.   fluticasone 50 MCG/ACT nasal spray Commonly known as: FLONASE USE 2 SPRAYS IN EACH NOSTRIL EVERY DAY   gabapentin 300 MG capsule Commonly known as: NEURONTIN TAKE ONE CAPSULE BY MOUTH IN THE MORNING, 1 CAPSULES IN THE AFTERNOON, AND 2 CAPSULES AT BEDTIME What changed: See the new instructions.   LORazepam 0.5 MG tablet Commonly known as: ATIVAN Take 1 tablet by mouth twice daily as needed for anxiety What changed:  reasons to take this additional instructions   losartan 100 MG tablet Commonly known as: COZAAR Take 100 mg by mouth daily. What changed: Another medication with the same name was removed. Continue taking this medication, and follow the directions you see here.   methocarbamol 500 MG tablet Commonly known  as: ROBAXIN Take 500 mg by mouth 3 (three) times daily as needed for muscle spasms.   metroNIDAZOLE 500 MG tablet Commonly known as: FLAGYL Take 1 tablet (500 mg total) by mouth every 12 (twelve) hours for 6 days.   montelukast 10 MG tablet Commonly known as: SINGULAIR Take 1 tablet (10 mg total) by mouth at bedtime.   multivitamin with minerals Tabs tablet Take 1 tablet by mouth daily.   oxyCODONE-acetaminophen 5-325 MG tablet Commonly known as: PERCOCET/ROXICET Take 1-2 tablets by mouth every 8 (eight) hours as needed for severe pain.   rosuvastatin 40 MG tablet Commonly known as: CRESTOR TAKE 1/2 (ONE-HALF) TABLET BY MOUTH AT BEDTIME What changed: See the new instructions.   sertraline 100 MG tablet Commonly known as: ZOLOFT Take 2 tablets (200 mg total) by mouth daily.   Trelegy Ellipta 100-62.5-25 MCG/INH Aepb Generic drug: Fluticasone-Umeclidin-Vilant INHALE 1 PUFF ONCE DAILY What changed:  how much to take how to take this when to take this   VITAMIN B 12 PO  Take 1 tablet by mouth daily.       Allergies  Allergen Reactions   Penicillins Hives and Shortness Of Breath   Lisinopril Other (See Comments)    Makes feel bad   Lovastatin     Cramps in legs and feet   Other     Fragrances in soaps, perfumes, shampoos, conditioners      The results of significant diagnostics from this hospitalization (including imaging, microbiology, ancillary and laboratory) are listed below for reference.    Significant Diagnostic Studies: CT ABDOMEN PELVIS W CONTRAST  Result Date: 03/17/2021 CLINICAL DATA:  Abdominal pain, nausea and vomiting, weakness, diarrhea EXAM: CT ABDOMEN AND PELVIS WITH CONTRAST TECHNIQUE: Multidetector CT imaging of the abdomen and pelvis was performed using the standard protocol following bolus administration of intravenous contrast. CONTRAST:  76mL OMNIPAQUE IOHEXOL 350 MG/ML SOLN COMPARISON:  None. FINDINGS: Lower chest: No acute pleural or  parenchymal lung disease. Small hiatal hernia. Hepatobiliary: No focal liver abnormality is seen. No gallstones, gallbladder wall thickening, or biliary dilatation. Pancreas: Unremarkable. No pancreatic ductal dilatation or surrounding inflammatory changes. Spleen: Mild splenomegaly measuring up 14 cm in craniocaudal length. No focal abnormality. Adrenals/Urinary Tract: No urinary tract calculi or obstructive uropathy. The kidneys enhance normally and symmetrically. The adrenals and bladder are unremarkable. Stomach/Bowel: No bowel obstruction or ileus. There is diffuse mural thickening of the colon compatible with inflammatory or infectious colitis. The appendix is surgically absent. Vascular/Lymphatic: Aortic atherosclerosis. No enlarged abdominal or pelvic lymph nodes. Reproductive: Status post hysterectomy. No adnexal masses. Other: No free fluid or free gas.  No abdominal wall hernia. Musculoskeletal: No acute or destructive bony lesions. Reconstructed images demonstrate no additional findings. IMPRESSION: 1. Diffuse colonic wall thickening consistent with inflammatory or infectious colitis. 2.  Aortic Atherosclerosis (ICD10-I70.0). 3. Small hiatal hernia. Electronically Signed   By: Randa Ngo M.D.   On: 03/17/2021 21:07   DG Chest Port 1 View  Result Date: 03/17/2021 CLINICAL DATA:  Sepsis, abdominal pain, nausea and vomiting, chills EXAM: PORTABLE CHEST 1 VIEW COMPARISON:  11/17/2020 FINDINGS: Single frontal view of the chest demonstrates an unremarkable cardiac silhouette. No acute airspace disease, effusion, or pneumothorax. No acute bony abnormalities. Hypertrophic changes are seen at the first costochondral junctions. IMPRESSION: 1. No acute intrathoracic process. Electronically Signed   By: Randa Ngo M.D.   On: 03/17/2021 18:31    Microbiology: Recent Results (from the past 240 hour(s))  C Difficile Quick Screen w PCR reflex     Status: None   Collection Time: 03/17/21  5:19 PM    Specimen: STOOL  Result Value Ref Range Status   C Diff antigen NEGATIVE NEGATIVE Final   C Diff toxin NEGATIVE NEGATIVE Final   C Diff interpretation No C. difficile detected.  Final    Comment: Performed at [facility], [facility] [address], [place], [place] [address]  Blood Culture (routine x 2)     Status: None (Preliminary result)   Collection Time: 03/17/21  6:18 PM   Specimen: BLOOD LEFT FOREARM  Result Value Ref Range Status   Specimen Description BLOOD LEFT FOREARM  Final   Special Requests   Final    BOTTLES DRAWN AEROBIC AND ANAEROBIC Blood Culture results may not be optimal due to an inadequate volume of blood received in culture bottles   Culture   Final    NO GROWTH 3 DAYS Performed at [facility], [place] [address] [place],  [address]    Report Status  PENDING  Incomplete  Resp Panel by RT-PCR (Flu A&B, Covid) Nasopharyngeal Swab     Status: None   Collection Time: 03/17/21  6:25 PM   Specimen: Nasopharyngeal Swab; Nasopharyngeal(NP) swabs in vial transport medium  Result Value Ref Range Status   SARS Coronavirus 2 by RT PCR NEGATIVE NEGATIVE Final    Comment: (NOTE) SARS-CoV-2 target nucleic acids are NOT DETECTED.  The SARS-CoV-2 RNA is generally detectable in upper respiratory specimens during the acute phase of infection. The lowest concentration of SARS-CoV-2 viral copies this assay can detect is 138 copies/mL. A negative result does not preclude SARS-Cov-2 infection and should not be used as the sole basis for treatment or other patient management decisions. A negative result may occur with  improper specimen collection/handling, submission of specimen other than nasopharyngeal swab, presence of viral mutation(s) within the areas targeted by this assay, and inadequate number of viral copies(<138 copies/mL). A negative result must be combined with clinical observations, patient history, and epidemiological information. The expected result is  Negative.  Fact Sheet for Patients:  EntrepreneurPulse.com.au  Fact Sheet for Healthcare Providers:  IncredibleEmployment.be  This test is no t yet approved or cleared by the [place] FDA and  has been authorized for detection and/or diagnosis of SARS-CoV-2 by FDA under an Emergency Use Authorization (EUA). This EUA will remain  in effect (meaning this test can be used) for the duration of the COVID-19 declaration under Section 564(b)(1) of the Act, 21 U.S.C.section 360bbb-3(b)(1), unless the authorization is terminated  or revoked sooner.       Influenza A by PCR NEGATIVE NEGATIVE Final   Influenza B by PCR NEGATIVE NEGATIVE Final    Comment: (NOTE) The Xpert Xpress SARS-CoV-2/FLU/RSV plus assay is intended as an aid in the diagnosis of influenza from Nasopharyngeal swab specimens and should not be used as a sole basis for treatment. Nasal washings and aspirates are unacceptable for Xpert Xpress SARS-CoV-2/FLU/RSV testing.  Fact Sheet for Patients: EntrepreneurPulse.com.au  Fact Sheet for Healthcare Providers: IncredibleEmployment.be  This test is not yet approved or cleared by the [place] FDA and has been authorized for detection and/or diagnosis of SARS-CoV-2 by FDA under an Emergency Use Authorization (EUA). This EUA will remain in effect (meaning this test can be used) for the duration of the COVID-19 declaration under Section 564(b)(1) of the Act, 21 U.S.C. section 360bbb-3(b)(1), unless the authorization is terminated or revoked.  Performed at [facility], [place] [address], [place], [place] [address]   Blood Culture (routine x 2)     Status: None (Preliminary result)   Collection Time: 03/17/21  6:25 PM   Specimen: BLOOD  Result Value Ref Range Status   Specimen Description BLOOD LEFT ANTECUBITAL  Final   Special Requests   Final    BOTTLES DRAWN AEROBIC AND ANAEROBIC Blood  Culture results may not be optimal due to an excessive volume of blood received in culture bottles   Culture   Final    NO GROWTH 3 DAYS Performed at [facility], [place] [address] [place], [place] [address]    Report Status PENDING  Incomplete  Gastrointestinal Panel by PCR , Stool     Status: None   Collection Time: 03/18/21  5:19 PM   Specimen: STOOL  Result Value Ref Range Status   Campylobacter species NOT DETECTED NOT DETECTED Final   Plesimonas shigelloides NOT DETECTED NOT DETECTED Final   Salmonella species NOT DETECTED NOT DETECTED Final   Yersinia enterocolitica  NOT DETECTED NOT DETECTED Final   Vibrio species NOT DETECTED NOT DETECTED Final   Vibrio cholerae NOT DETECTED NOT DETECTED Final   Enteroaggregative E coli (EAEC) NOT DETECTED NOT DETECTED Final   Enteropathogenic E coli (EPEC) NOT DETECTED NOT DETECTED Final   Enterotoxigenic E coli (ETEC) NOT DETECTED NOT DETECTED Final   Shiga like toxin producing E coli (STEC) NOT DETECTED NOT DETECTED Final   Shigella/Enteroinvasive E coli (EIEC) NOT DETECTED NOT DETECTED Final   Cryptosporidium NOT DETECTED NOT DETECTED Final   Cyclospora cayetanensis NOT DETECTED NOT DETECTED Final   Entamoeba histolytica NOT DETECTED NOT DETECTED Final   Giardia lamblia NOT DETECTED NOT DETECTED Final   Adenovirus F40/41 NOT DETECTED NOT DETECTED Final   Astrovirus NOT DETECTED NOT DETECTED Final   Norovirus GI/GII NOT DETECTED NOT DETECTED Final   Rotavirus A NOT DETECTED NOT DETECTED Final   Sapovirus (I, II, IV, and V) NOT DETECTED NOT DETECTED Final    Comment: Performed at [facility], [place] [place], [place] [address]     Labs: Basic Metabolic Panel: Recent Labs  Lab 03/17/21 1825 03/18/21 0354 03/19/21 0639 03/20/21 0108  NA 138 138 140 141  K 3.1* 3.3* 3.2* 3.6  CL 106 110 111 113*  CO2 23 21* 21* 22  GLUCOSE 156* 120* 93 85  BUN 20 14 13 11   CREATININE 1.10* 0.88 0.78 0.76  CALCIUM 8.1*  7.9* 8.2* 8.2*  MG  --   --   --  1.9   Liver Function Tests: Recent Labs  Lab 03/17/21 1825  AST 24  ALT 21  ALKPHOS 58  BILITOT 0.6  PROT 6.1*  ALBUMIN 3.3*   No results for input(s): LIPASE, AMYLASE in the last 168 hours. No results for input(s): AMMONIA in the last 168 hours. CBC: Recent Labs  Lab 03/17/21 1825 03/18/21 0354 03/19/21 0639 03/20/21 0108  WBC 11.6* 7.2 3.8* 4.6  NEUTROABS 10.8*  --   --   --   HGB 14.7 13.7 13.5 12.8  HCT 45.4 41.8 40.4 38.7  MCV 98.1 97.7 95.7 94.9  PLT 137* 107* 99* 130*   Cardiac Enzymes: No results for input(s): CKTOTAL, CKMB, CKMBINDEX, TROPONINI in the last 168 hours. BNP: BNP (last 3 results) No results for input(s): BNP in the last 8760 hours.  ProBNP (last 3 results) No results for input(s): PROBNP in the last 8760 hours.  CBG: Recent Labs  Lab 03/18/21 1954 03/19/21 0940 03/19/21 1212 03/19/21 1623  GLUCAP 122* 156* 155* 106*       Signed:  Nita Sells MD   [facility] 03/20/2021, 9:36 AM

## 2021-03-20 NOTE — Progress Notes (Signed)
PIV's removed. Discharge instructions completed. Patient verbalized understanding of medication regimen, follow up appointments and discharge instructions. Patient belongings gathered and packed to discharge. Patient taken to DC area and assisted patient into car with husband.

## 2021-03-22 LAB — CULTURE, BLOOD (ROUTINE X 2)
Culture: NO GROWTH
Culture: NO GROWTH

## 2021-03-30 ENCOUNTER — Telehealth: Payer: Self-pay | Admitting: Gastroenterology

## 2021-03-30 NOTE — Telephone Encounter (Signed)
Left message for pt to call back x 4 Multiple attempts were made to reach pt with Voice messages left each time for pt to called back.  03/30/21 03/31/21 04/02/21 04/05/21

## 2021-03-30 NOTE — Telephone Encounter (Signed)
Patient called states she was recently seen at ED and has not been able to have a BM seeking advise.

## 2021-04-05 NOTE — Telephone Encounter (Signed)
Pt stated that she  is passing gas and having BM's now. Pt encouraged to call back if she had any other issues or concerns. Pt verbalized understanding with all questions answered.

## 2021-04-05 NOTE — Telephone Encounter (Signed)
Patient returned your call.  She said if you call back to let it ring because it takes her a while to get to the phone to answer it.  Thank you.

## 2021-04-07 ENCOUNTER — Ambulatory Visit (INDEPENDENT_AMBULATORY_CARE_PROVIDER_SITE_OTHER): Payer: Medicare Other

## 2021-04-07 ENCOUNTER — Other Ambulatory Visit: Payer: Self-pay

## 2021-04-07 ENCOUNTER — Encounter: Payer: Self-pay | Admitting: Pulmonary Disease

## 2021-04-07 ENCOUNTER — Ambulatory Visit (INDEPENDENT_AMBULATORY_CARE_PROVIDER_SITE_OTHER): Payer: Medicare Other | Admitting: Pulmonary Disease

## 2021-04-07 VITALS — BP 136/68 | HR 83 | Temp 98.3°F | Ht 62.0 in | Wt 189.6 lb

## 2021-04-07 DIAGNOSIS — R058 Other specified cough: Secondary | ICD-10-CM

## 2021-04-07 MED ORDER — DOXYCYCLINE HYCLATE 100 MG PO TABS: 100.0000 mg | ORAL_TABLET | Freq: Two times a day (BID) | ORAL | 0 refills | Status: DC

## 2021-04-07 NOTE — Progress Notes (Signed)
Tiffany Murphy    [id number]    Apr 16, 1951  Primary Care Physician:Opalski, Neoma Laming, DO  Referring Physician: Mellody Dance, [address] [place], [place] [address]  Chief complaint:   Patient being seen for follow-up for COPD  HPI:  Known to have mild obstructive sleep apnea Has not used CPAP in over 2 years She states she feels well otherwise Has other social issues going on  She was recently hospitalized with a bad colitis and was on antibiotics  She admits to a cough, shortness of breath, wheezing No hemoptysis  Were talked about sleep apnea treatment Inspire device  continues to smoke about 5-7 sticks a day   Has a history of COPD History of obstructive sleep apnea Did use CPAP regularly in the past-has not in the last year-she did try to use it again recently and felt the pressure was not right--she has not been able to tolerate it  She has an active smoker, smokes about half a pack a day, at the area she was smoking 2 packs of cigarettes a day  She has a history of hypertension, mood disorder, panic attacks Hyperlipidemia Lung nodule noted in the past  History of chronic fatigue  She was on Symbicort-feels it may not be helping as well as it did in the past  Outpatient Encounter Medications as of 07/30/2018  Medication Sig   albuterol (PROVENTIL HFA;VENTOLIN HFA) 108 (90 Base) MCG/ACT inhaler Inhale 2 puffs into the lungs every 6 (six) hours as needed for wheezing.   ALPRAZolam (XANAX) 0.25 MG tablet 1 tab as needed for PANIC attack only   buPROPion (WELLBUTRIN SR) 150 MG 12 hr tablet Please take 150mg  (one tablet) for three days then take one tablet twice a day for 3 months.   busPIRone (BUSPAR) 10 MG tablet Take 1 tablet (10 mg total) by mouth 3 (three) times daily.   dicyclomine (BENTYL) 10 MG capsule Take 1 capsule (10 mg total) by mouth 4 (four) times daily.   fluticasone (FLONASE) 50 MCG/ACT nasal spray Place 2 sprays into both  nostrils daily.   Fluticasone-Umeclidin-Vilant (TRELEGY ELLIPTA) 100-62.5-25 MCG/INH AEPB Inhale 1 puff into the lungs daily.   gabapentin (NEURONTIN) 300 MG capsule 1 tab in am, 1 in afternoon and 2 po q hs   lisinopril-hydrochlorothiazide (PRINZIDE,ZESTORETIC) 20-25 MG tablet TAKE ONE TABLET BY MOUTH ONCE DAILY "OV NEEDED FOR ADDITIONAL VISITS"   montelukast (SINGULAIR) 10 MG tablet TAKE 1 TABLET BY MOUTH AT BEDTIME   mupirocin ointment (BACTROBAN) 2 % APPLY OINTMENT EXTERNALLY TO AFFECTED AREA TWICE DAILY FOR 10 DAYS THEN THE FIRST 5 DAYS OF THE MONTH FOR 3 MONTHS APPLY TO NOSTRILS UNDER B   omeprazole (PRILOSEC) 40 MG capsule Take 1 capsule (40 mg total) by mouth daily.   potassium chloride SA (K-DUR,KLOR-CON) 20 MEQ tablet TAKE 1 TABLET BY MOUTH TWICE DAILY   rosuvastatin (CRESTOR) 20 MG tablet TAKE 1 TABLET BY MOUTH AT BEDTIME   sertraline (ZOLOFT) 100 MG tablet Take 2 tablets (200 mg total) by mouth daily.   Vitamin D, Ergocalciferol, (DRISDOL) 1.25 MG (50000 UT) CAPS capsule Take 1 capsule (50,000 Units total) by mouth every 7 (seven) days.   No facility-administered encounter medications on file as of 07/30/2018.     Allergies as of 07/30/2018 - Review Complete 07/30/2018  Allergen Reaction Noted   Penicillins Hives and Shortness Of Breath 08/05/2012   Lovastatin  07/11/2013   Other  07/11/2013  Past Medical History:  Diagnosis Date   Asthma    COPD (chronic obstructive pulmonary disease) (HCC)    GERD (gastroesophageal reflux disease)    Hypertension    Tobacco abuse     Past Surgical History:  Procedure Laterality Date   APPENDECTOMY     COLONOSCOPY  08/26/2015   Colonic polyps status post polypectomy. Minimal sigmoid diverticulosis.    ESOPHAGOGASTRODUODENOSCOPY  02/25/2011   Large hiatal hernia otherwise normal EGD.   VAGINAL HYSTERECTOMY      Family History  Problem Relation Age of Onset   Alzheimer's disease Father 59   Diabetes Father    CAD Mother 33    Colon cancer Neg Hx    Esophageal cancer Neg Hx     Social History   Socioeconomic History   Marital status: Married    Spouse name: Not on file   Number of children: 2   Years of education: Not on file   Highest education level: Not on file  Occupational History   Not on file  Social Needs   Financial resource strain: Not on file   Food insecurity:    Worry: Not on file    Inability: Not on file   Transportation needs:    Medical: Not on file    Non-medical: Not on file  Tobacco Use   Smoking status: Current Every Day Smoker    Packs/day: 0.50    Years: 44.00    Pack years: 22.00    Types: Cigarettes   Smokeless tobacco: Never Used  Substance and Sexual Activity   Alcohol use: No    Alcohol/week: 0.0 standard drinks   Drug use: No   Sexual activity: Yes    Birth control/protection: Condom, None  Lifestyle   Physical activity:    Days per week: Not on file    Minutes per session: Not on file   Stress: Not on file  Relationships   Social connections:    Talks on phone: Not on file    Gets together: Not on file    Attends religious service: Not on file    Active member of club or organization: Not on file    Attends meetings of clubs or organizations: Not on file    Relationship status: Not on file   Intimate partner violence:    Fear of current or ex partner: Not on file    Emotionally abused: Not on file    Physically abused: Not on file    Forced sexual activity: Not on file  Other Topics Concern   Not on file  Social History Narrative   Works at [employer].     Review of Systems  Constitutional:  Positive for fatigue.  HENT: Negative.    Eyes: Negative.   Respiratory:  Positive for apnea, cough and shortness of breath.   Cardiovascular: Negative.   Gastrointestinal: Negative.   Endocrine: Negative.   Genitourinary: Negative.   Psychiatric/Behavioral:  Positive for sleep disturbance.   All other systems reviewed and are  negative.  Vitals:   07/30/18 1410  BP: (!) 142/70  Pulse: 68  SpO2: 96%   Physical Exam Constitutional:      Appearance: She is well-developed. She is obese.  HENT:     Head: Normocephalic and atraumatic.  Eyes:     General:        Right eye: No discharge.        Left eye: No discharge.     Conjunctiva/sclera: Conjunctivae normal.  Pupils: Pupils are equal, round, and reactive to light.  Neck:     Thyroid: No thyromegaly.     Trachea: No tracheal deviation.  Cardiovascular:     Rate and Rhythm: Normal rate and regular rhythm.  Pulmonary:     Effort: Pulmonary effort is normal. No respiratory distress.     Breath sounds: No stridor. No wheezing or rhonchi.  Musculoskeletal:     Cervical back: No rigidity or tenderness.  Neurological:     Mental Status: She is alert.  Psychiatric:        Mood and Affect: Mood normal.   Data Reviewed: Report of a previous CT scan noted in record Most recent CT reviewed showing stable nodules-reviewed by myself  Sleep study results not available  Recent hospital discharge record reviewed  Assessment:  .  Obstructive lung disease .  Unfortunately continues to smoke .  Having an exacerbation of bronchitis at present  .  History of abnormal CT scan of the chest .  Old granulomatous disease  .  History of obstructive sleep apnea .  She does have mild obstructive sleep apnea .  Feels well at the present time .  Not using CPAP  .  Shortness of breath -Related to history of COPD .  Active smoker .  Continues to smoke about 5 to 7 cigarettes a day -Was on Wellbutrin  Recent colitis  Plan/Recommendations:  Continue Trelegy  Graded exercise as tolerated  Smoking cessation counseling  Tentative follow-up in about 3 months  Sherrilyn Rist MD [facility] 07/30/2018, 2:11 PM  CC: Mellody Dance, DO

## 2021-04-07 NOTE — Patient Instructions (Signed)
Prescription for doxycycline will be sent into pharmacy  Obtain chest x-ray today  Continue working on quitting smoking  I will see you back in about 3 months

## 2021-05-05 ENCOUNTER — Ambulatory Visit: Payer: Medicare Other | Admitting: Gastroenterology

## 2021-05-08 ENCOUNTER — Other Ambulatory Visit: Payer: Self-pay | Admitting: Pulmonary Disease

## 2021-07-08 ENCOUNTER — Encounter: Payer: Self-pay | Admitting: Pulmonary Disease

## 2021-07-08 ENCOUNTER — Other Ambulatory Visit: Payer: Self-pay

## 2021-07-08 ENCOUNTER — Ambulatory Visit (INDEPENDENT_AMBULATORY_CARE_PROVIDER_SITE_OTHER): Payer: Medicare Other | Admitting: Pulmonary Disease

## 2021-07-08 DIAGNOSIS — J3089 Other allergic rhinitis: Secondary | ICD-10-CM

## 2021-07-08 MED ORDER — FLUTICASONE PROPIONATE 50 MCG/ACT NA SUSP: 2.0000 | Freq: Every day | NASAL | 1 refills | Status: DC

## 2021-07-08 NOTE — Patient Instructions (Signed)
Continue Trelegy on a daily basis  Rescue inhaler use as needed  Stay off cigarettes as best as you can  I will see you back in about 6 months  Call with significant concerns

## 2021-07-08 NOTE — Progress Notes (Signed)
Tiffany Murphy    [id number]    Feb 27, 1951  Primary Care Physician:Stowe, Rachel Moulds, NP  Referring Physician: Cipriano Mile, NP [place],  [place] [address]  Chief complaint:   Patient being seen for follow-up for COPD  HPI:  Recent hospitalization for colitis for which she had antibiotics  Breathing has been relatively stable, uses Trelegy She was able to stay off cigarettes for about 3 months but resumed and in the last couple of days she has stopped smoking again with a patch in place  She feels relatively well otherwise She admits to a cough, shortness of breath, wheezing No hemoptysis  Were talked about sleep apnea treatment Inspire device   was able to stay off cigarettes for about 3 months but started smoking again and then the last couple of days she has stopped with a patch in place  Has a history of COPD History of obstructive sleep apnea Did use CPAP regularly in the past-has not in the last year-she did try to use it again recently and felt the pressure was not right--she has not been able to tolerate it  She has an active smoker, smokes about half a pack a day, at the area she was smoking 2 packs of cigarettes a day  She has a history of hypertension, mood disorder, panic attacks Hyperlipidemia Lung nodule noted in the past  History of chronic fatigue  Outpatient Encounter Medications as of 07/08/2021  Medication Sig   albuterol (VENTOLIN HFA) 108 (90 Base) MCG/ACT inhaler Inhale 2 puffs into the lungs every 6 (six) hours as needed for wheezing.   buPROPion (WELLBUTRIN XL) 150 MG 24 hr tablet Take 150 mg by mouth every morning.   Cyanocobalamin (VITAMIN B 12 PO) Take 1 tablet by mouth daily.   doxycycline (VIBRA-TABS) 100 MG tablet Take 1 tablet (100 mg total) by mouth 2 (two) times daily.   fluticasone (FLONASE) 50 MCG/ACT nasal spray USE 2 SPRAYS IN EACH NOSTRIL EVERY DAY (Patient taking differently: Place 2 sprays into both nostrils  daily.)   Fluticasone-Umeclidin-Vilant (TRELEGY ELLIPTA) 100-62.5-25 MCG/ACT AEPB Inhale 1 puff into the lungs daily. INHALE 1 PUFF ONCE DAILY   gabapentin (NEURONTIN) 300 MG capsule TAKE ONE CAPSULE BY MOUTH IN THE MORNING, 1 CAPSULES IN THE AFTERNOON, AND 2 CAPSULES AT BEDTIME (Patient taking differently: Take 300-600 mg by mouth See admin instructions. 300 mg in the morning and afternoon 600 mg at bedtime)   LORazepam (ATIVAN) 0.5 MG tablet Take 1 tablet by mouth twice daily as needed for anxiety (Patient taking differently: Take 0.5 mg by mouth 2 (two) times daily as needed for anxiety.)   losartan (COZAAR) 100 MG tablet Take 100 mg by mouth daily.   methocarbamol (ROBAXIN) 500 MG tablet Take 500 mg by mouth 3 (three) times daily as needed for muscle spasms.   montelukast (SINGULAIR) 10 MG tablet Take 1 tablet (10 mg total) by mouth at bedtime.   Multiple Vitamin (MULTIVITAMIN WITH MINERALS) TABS tablet Take 1 tablet by mouth daily.   Omega-3 Fatty Acids (FISH OIL) 1000 MG CPDR Take 1,000 mg by mouth daily.   oxyCODONE-acetaminophen (PERCOCET/ROXICET) 5-325 MG tablet Take 1-2 tablets by mouth every 8 (eight) hours as needed for severe pain. (Patient not taking: No sig reported)   rosuvastatin (CRESTOR) 40 MG tablet TAKE 1/2 (ONE-HALF) TABLET BY MOUTH AT BEDTIME (Patient taking differently: Take 20 mg by mouth at bedtime.)   sertraline (ZOLOFT) 100 MG tablet Take  2 tablets (200 mg total) by mouth daily.   No facility-administered encounter medications on file as of 07/08/2021.    Allergies as of 07/08/2021 - Review Complete 04/07/2021  Allergen Reaction Noted   Penicillins Hives and Shortness Of Breath 08/05/2012   Lisinopril Other (See Comments) 12/05/2019   Lovastatin  07/11/2013   Other  07/11/2013    Past Medical History:  Diagnosis Date   Anxiety    Asthma    Bronchitis    Closed fracture of left distal radius 10/25/2018   COPD (chronic obstructive pulmonary disease) (HCC)     Depression    GERD (gastroesophageal reflux disease)    Headache    Hypercholesteremia    Hypertension    IBS (irritable bowel syndrome)    Sleep apnea    Tobacco abuse     Past Surgical History:  Procedure Laterality Date   APPENDECTOMY     COLONOSCOPY  08/26/2015   Colonic polyps status post polypectomy. Minimal sigmoid diverticulosis.    ESOPHAGOGASTRODUODENOSCOPY  02/25/2011   Large hiatal hernia otherwise normal EGD.   OPEN REDUCTION INTERNAL FIXATION (ORIF) DISTAL RADIAL FRACTURE Left 10/25/2018   Procedure: OPEN REDUCTION INTERNAL FIXATION (ORIF)LEFT  DISTAL RADIAL FRACTURE;  Surgeon: Marchia Bond, MD;  Location: [facility];  Service: Orthopedics;  Laterality: Left;   VAGINAL HYSTERECTOMY      Family History  Problem Relation Age of Onset   Alzheimer's disease Father 71   Diabetes Father    CAD Mother 109   Colon cancer Neg Hx    Esophageal cancer Neg Hx     Social History   Socioeconomic History   Marital status: Married    Spouse name: Not on file   Number of children: 2   Years of education: Not on file   Highest education level: Not on file  Occupational History   Not on file  Tobacco Use   Smoking status: Former    Packs/day: 1.00    Years: 44.00    Pack years: 44.00    Types: Cigarettes    Quit date: 12/19/2019    Years since quitting: 1.5   Smokeless tobacco: Never   Tobacco comments:    in process of quitting  Vaping Use   Vaping Use: Former   Devices: did it for 6 months and stopped   Substance and Sexual Activity   Alcohol use: No    Alcohol/week: 0.0 standard drinks   Drug use: Yes    Types: Marijuana    Comment: 2 puffs per day   Sexual activity: Not Currently  Other Topics Concern   Not on file  Social History Narrative   Works at [employer].    Social Determinants of Health   Financial Resource Strain: Not on file  Food Insecurity: Not on file  Transportation Needs: Not on file  Physical Activity:  Not on file  Stress: Not on file  Social Connections: Not on file  Intimate Partner Violence: Not on file    Review of Systems  Constitutional:  Positive for fatigue.  HENT: Negative.    Eyes: Negative.   Respiratory:  Positive for apnea, cough and shortness of breath.   Cardiovascular: Negative.   Gastrointestinal: Negative.   Endocrine: Negative.   Genitourinary: Negative.   Psychiatric/Behavioral:  Positive for sleep disturbance.   All other systems reviewed and are negative.  There were no vitals filed for this visit.  Physical Exam Constitutional:      Appearance: She is  well-developed. She is obese.  HENT:     Head: Normocephalic and atraumatic.  Eyes:     General:        Right eye: No discharge.        Left eye: No discharge.     Conjunctiva/sclera: Conjunctivae normal.     Pupils: Pupils are equal, round, and reactive to light.  Neck:     Thyroid: No thyromegaly.     Trachea: No tracheal deviation.  Cardiovascular:     Rate and Rhythm: Normal rate and regular rhythm.  Pulmonary:     Effort: Pulmonary effort is normal. No respiratory distress.     Breath sounds: No stridor. No wheezing or rhonchi.  Musculoskeletal:     Cervical back: No rigidity or tenderness.  Neurological:     Mental Status: She is alert.  Psychiatric:        Mood and Affect: Mood normal.   Data Reviewed: Report of a previous CT scan noted in record Most recent CT reviewed showing stable nodules-reviewed by myself  Sleep study results not available  Recent hospital discharge record reviewed  Assessment:  .  Obstructive lung disease -Was able to stop smoking -Encouraged to stay off cigarettes  .  History of abnormal CT scan of the chest .  Old granulomatous disease  .  History of obstructive sleep apnea -Not using CPAP -History of mild obstructive sleep apnea  .  Shortness of breath -Related to history of COPD  .  Active smoker -Recently  quit  Plan/Recommendations:  Continue Trelegy  Graded exercise as tolerated  Encouraged about staying off cigarettes  Tentative follow-up in about 6 months  Sherrilyn Rist MD [facility] 07/08/2021, 1:49 PM  CC: Cipriano Mile, NP

## 2021-07-26 ENCOUNTER — Other Ambulatory Visit: Payer: Self-pay

## 2021-07-26 ENCOUNTER — Encounter: Payer: Self-pay | Admitting: Family Medicine

## 2021-07-26 ENCOUNTER — Ambulatory Visit (INDEPENDENT_AMBULATORY_CARE_PROVIDER_SITE_OTHER): Payer: Medicare Other | Admitting: Family Medicine

## 2021-07-26 VITALS — BP 117/78 | HR 66 | Temp 98.3°F | Resp 16 | Ht 62.0 in | Wt 182.6 lb

## 2021-07-26 DIAGNOSIS — I10 Essential (primary) hypertension: Secondary | ICD-10-CM

## 2021-07-26 DIAGNOSIS — F418 Other specified anxiety disorders: Secondary | ICD-10-CM | POA: Diagnosis not present

## 2021-07-26 DIAGNOSIS — Z1211 Encounter for screening for malignant neoplasm of colon: Secondary | ICD-10-CM | POA: Diagnosis not present

## 2021-07-26 DIAGNOSIS — Z7689 Persons encountering health services in other specified circumstances: Secondary | ICD-10-CM

## 2021-07-26 MED ORDER — BUPROPION HCL ER (XL) 300 MG PO TB24: 300.0000 mg | ORAL_TABLET | Freq: Every day | ORAL | 0 refills | Status: DC

## 2021-07-26 NOTE — Progress Notes (Signed)
Patient is here to establish care.  Patient would like a referral for GI. Patient has no other concerns for provider today.

## 2021-07-26 NOTE — Progress Notes (Signed)
New Patient Office Visit  Subjective:  Patient ID: Tiffany Murphy, female    DOB: 01-15-1951  Age: 71 y.o. MRN: [id number]  CC:  Chief Complaint  Patient presents with   Establish Care    HPI Tiffany Murphy presents for to establish care and for review of chronic med issue hypertension. Patient also reports that she has had increasing social stressors.  Past Medical History:  Diagnosis Date   Anxiety    Asthma    Bronchitis    Closed fracture of left distal radius 10/25/2018   COPD (chronic obstructive pulmonary disease) (HCC)    Depression    GERD (gastroesophageal reflux disease)    Headache    Hypercholesteremia    Hypertension    IBS (irritable bowel syndrome)    Sleep apnea    Tobacco abuse     Past Surgical History:  Procedure Laterality Date   APPENDECTOMY     COLONOSCOPY  08/26/2015   Colonic polyps status post polypectomy. Minimal sigmoid diverticulosis.    ESOPHAGOGASTRODUODENOSCOPY  02/25/2011   Large hiatal hernia otherwise normal EGD.   OPEN REDUCTION INTERNAL FIXATION (ORIF) DISTAL RADIAL FRACTURE Left 10/25/2018   Procedure: OPEN REDUCTION INTERNAL FIXATION (ORIF)LEFT  DISTAL RADIAL FRACTURE;  Surgeon: Marchia Bond, MD;  Location: [facility];  Service: Orthopedics;  Laterality: Left;   VAGINAL HYSTERECTOMY      Family History  Problem Relation Age of Onset   Alzheimer's disease Father 42   Diabetes Father    CAD Mother 10   Colon cancer Neg Hx    Esophageal cancer Neg Hx     Social History   Socioeconomic History   Marital status: Married    Spouse name: Not on file   Number of children: 2   Years of education: Not on file   Highest education level: Not on file  Occupational History   Not on file  Tobacco Use   Smoking status: Former    Packs/day: 1.00    Years: 44.00    Pack years: 44.00    Types: Cigarettes    Quit date: 12/19/2019    Years since quitting: 1.6   Smokeless tobacco: Never   Tobacco comments:    in  process of quitting  Vaping Use   Vaping Use: Former   Devices: did it for 6 months and stopped   Substance and Sexual Activity   Alcohol use: No    Alcohol/week: 0.0 standard drinks   Drug use: Yes    Types: Marijuana    Comment: 2 puffs per day   Sexual activity: Not Currently  Other Topics Concern   Not on file  Social History Narrative   Works at [employer].    Social Determinants of Health   Financial Resource Strain: Not on file  Food Insecurity: Not on file  Transportation Needs: Not on file  Physical Activity: Not on file  Stress: Not on file  Social Connections: Not on file  Intimate Partner Violence: Not on file    ROS Review of Systems  Psychiatric/Behavioral:  Positive for sleep disturbance. Negative for self-injury and suicidal ideas. The patient is nervous/anxious.   All other systems reviewed and are negative.  Objective:   Today's Vitals: BP 117/78    Pulse 66    Temp 98.3 F (36.8 C) (Oral)    Resp 16    Ht 5\' 2"  (1.575 m)    Wt 182 lb 9.6 oz (82.8 kg)    SpO2 95%  BMI 33.40 kg/m   Physical Exam Vitals and nursing note reviewed.  Constitutional:      General: She is not in acute distress.    Appearance: She is obese.  Cardiovascular:     Rate and Rhythm: Normal rate and regular rhythm.  Pulmonary:     Effort: Pulmonary effort is normal.     Breath sounds: Normal breath sounds.  Abdominal:     Palpations: Abdomen is soft.     Tenderness: There is no abdominal tenderness.  Neurological:     General: No focal deficit present.     Mental Status: She is alert and oriented to person, place, and time.  Psychiatric:        Mood and Affect: Mood is anxious and depressed. Affect is tearful.        Behavior: Behavior normal.    Assessment & Plan:   1. Depression with anxiety Patient on zoloft and wellbutrin. Will increase Wellbutrin dosage from 150 mg to 300mg  daily and monitor  2. Essential hypertension Appears stable. Continue  present management and monitor  3. Screening for colon cancer Referral for colonoscopy - Ambulatory referral to Gastroenterology  4. Encounter to establish care     Outpatient Encounter Medications as of 07/26/2021  Medication Sig   albuterol (VENTOLIN HFA) 108 (90 Base) MCG/ACT inhaler Inhale 2 puffs into the lungs every 6 (six) hours as needed for wheezing.   buPROPion (WELLBUTRIN XL) 300 MG 24 hr tablet Take 1 tablet (300 mg total) by mouth daily.   celecoxib (CELEBREX) 200 MG capsule Take 200 mg by mouth 2 (two) times daily.   Cyanocobalamin (VITAMIN B 12 PO) Take 1 tablet by mouth daily.   fluticasone (FLONASE) 50 MCG/ACT nasal spray Place 2 sprays into both nostrils daily.   Fluticasone-Umeclidin-Vilant (TRELEGY ELLIPTA) 100-62.5-25 MCG/ACT AEPB Inhale 1 puff into the lungs daily. INHALE 1 PUFF ONCE DAILY   gabapentin (NEURONTIN) 300 MG capsule TAKE ONE CAPSULE BY MOUTH IN THE MORNING, 1 CAPSULES IN THE AFTERNOON, AND 2 CAPSULES AT BEDTIME (Patient taking differently: Take 300-600 mg by mouth See admin instructions. 300 mg in the morning and afternoon 600 mg at bedtime)   LORazepam (ATIVAN) 0.5 MG tablet Take 1 tablet by mouth twice daily as needed for anxiety (Patient taking differently: Take 0.5 mg by mouth 2 (two) times daily as needed for anxiety.)   losartan (COZAAR) 100 MG tablet Take 100 mg by mouth daily.   methocarbamol (ROBAXIN) 500 MG tablet Take 500 mg by mouth 3 (three) times daily as needed for muscle spasms.   montelukast (SINGULAIR) 10 MG tablet Take 1 tablet (10 mg total) by mouth at bedtime. (Patient taking differently: Take 10 mg by mouth as needed.)   Multiple Vitamin (MULTIVITAMIN WITH MINERALS) TABS tablet Take 1 tablet by mouth daily.   Omega-3 Fatty Acids (FISH OIL) 1000 MG CPDR Take 1,000 mg by mouth daily.   rosuvastatin (CRESTOR) 40 MG tablet TAKE 1/2 (ONE-HALF) TABLET BY MOUTH AT BEDTIME (Patient taking differently: Take 20 mg by mouth at bedtime.)    sertraline (ZOLOFT) 100 MG tablet Take 2 tablets (200 mg total) by mouth daily.   TRABECTEDIN IV Inject into the vein.   [DISCONTINUED] buPROPion (WELLBUTRIN XL) 150 MG 24 hr tablet Take 150 mg by mouth every morning.   No facility-administered encounter medications on file as of 07/26/2021.    Follow-up: Return in about 4 weeks (around 08/23/2021) for physical.   Becky Sax, MD

## 2021-07-27 ENCOUNTER — Encounter: Payer: Self-pay | Admitting: Gastroenterology

## 2021-08-13 ENCOUNTER — Telehealth: Payer: Self-pay | Admitting: Pulmonary Disease

## 2021-08-13 DIAGNOSIS — J3089 Other allergic rhinitis: Secondary | ICD-10-CM

## 2021-08-13 NOTE — Telephone Encounter (Signed)
Called and unable to reach patient. No voicemail set up. We sent in Trelegy and Flonase to [facility] if she calls back.

## 2021-08-16 NOTE — Telephone Encounter (Signed)
Noted  

## 2021-08-17 ENCOUNTER — Other Ambulatory Visit: Payer: Self-pay

## 2021-08-17 ENCOUNTER — Ambulatory Visit (AMBULATORY_SURGERY_CENTER): Payer: Medicare Other | Admitting: *Deleted

## 2021-08-17 ENCOUNTER — Ambulatory Visit (INDEPENDENT_AMBULATORY_CARE_PROVIDER_SITE_OTHER): Payer: Medicare Other | Admitting: Clinical

## 2021-08-17 VITALS — Ht 62.0 in | Wt 175.0 lb

## 2021-08-17 DIAGNOSIS — F331 Major depressive disorder, recurrent, moderate: Secondary | ICD-10-CM | POA: Diagnosis not present

## 2021-08-17 DIAGNOSIS — Z86010 Personal history of colonic polyps: Secondary | ICD-10-CM

## 2021-08-17 DIAGNOSIS — F411 Generalized anxiety disorder: Secondary | ICD-10-CM | POA: Diagnosis not present

## 2021-08-17 NOTE — Progress Notes (Signed)
No egg or soy allergy known to patient  No issues known to pt with past sedation with any surgeries or procedures Patient denies ever being told they had issues or difficulty with intubation  No FH of Malignant Hyperthermia Pt is not on diet pills Pt is not on  home 02  Pt is not on blood thinners  Pt denies issues with constipation  No A fib or A flutter  Pt is  vaccinated  for Covid   Due to the COVID-19 pandemic we are asking patients to follow certain guidelines in PV and the [facility]   Pt aware of COVID protocols and [facility] guidelines   PV completed over the phone. Pt verified name, DOB, address and insurance during PV today.  Pt mailed instruction packet with copy of consent form to read and not return, and instructions.  Pt encouraged to call with questions or issues.  If pt has My chart, procedure instructions sent via My Chart    Sample sheet of over the counter items to purchase for prep sent.
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
## 2021-08-25 ENCOUNTER — Encounter: Payer: Self-pay | Admitting: Family Medicine

## 2021-08-25 ENCOUNTER — Ambulatory Visit (INDEPENDENT_AMBULATORY_CARE_PROVIDER_SITE_OTHER): Payer: Medicare Other | Admitting: Family Medicine

## 2021-08-25 VITALS — BP 118/77 | HR 69 | Temp 98.1°F | Resp 16 | Wt 175.0 lb

## 2021-08-25 DIAGNOSIS — I10 Essential (primary) hypertension: Secondary | ICD-10-CM | POA: Diagnosis not present

## 2021-08-25 DIAGNOSIS — Z780 Asymptomatic menopausal state: Secondary | ICD-10-CM | POA: Diagnosis not present

## 2021-08-25 DIAGNOSIS — Z1231 Encounter for screening mammogram for malignant neoplasm of breast: Secondary | ICD-10-CM

## 2021-08-25 DIAGNOSIS — M2550 Pain in unspecified joint: Secondary | ICD-10-CM

## 2021-08-25 DIAGNOSIS — F418 Other specified anxiety disorders: Secondary | ICD-10-CM

## 2021-08-25 DIAGNOSIS — Z716 Tobacco abuse counseling: Secondary | ICD-10-CM

## 2021-08-25 DIAGNOSIS — N3941 Urge incontinence: Secondary | ICD-10-CM | POA: Diagnosis not present

## 2021-08-25 DIAGNOSIS — Z13820 Encounter for screening for osteoporosis: Secondary | ICD-10-CM

## 2021-08-25 MED ORDER — TRIAMCINOLONE ACETONIDE 40 MG/ML IJ SUSP
40.0000 mg | Freq: Once | INTRAMUSCULAR | Status: AC
  Administered 2021-08-25: 40 mg via INTRAMUSCULAR

## 2021-08-25 MED ORDER — TOLTERODINE TARTRATE ER 4 MG PO CP24
4.0000 mg | ORAL_CAPSULE | Freq: Every day | ORAL | 0 refills | Status: DC
Start: 2021-08-25 — End: 2021-09-07

## 2021-08-25 NOTE — Progress Notes (Signed)
?  Patient is here for CPE. ? ?Patient would like to talk about her arthritis getting worst. Patient said that she can tell in here hands. ?

## 2021-08-25 NOTE — Progress Notes (Signed)
? ?Established Patient Office Visit ? ?Subjective:  ?Patient ID: Tiffany Murphy, female    DOB: 1950/08/21  Age: 71 y.o. MRN: [id number] ? ?CC:  ?Chief Complaint  ?Patient presents with  ? Annual Exam  ? ? ?HPI ?Tiffany Murphy presents for complaint of back pain and follow up of depression and anxiety. Patient also reports of multiple joint pains and also urinary frequency and urgency. She reports these sx are worsening.  ? ?Past Medical History:  ?Diagnosis Date  ? Allergy   ? "THINK I GOT THEM"  ? Anxiety   ? Arthritis   ? "BODY FULL OF ARTHRITIS"  ? Asthma   ? Bronchitis   ? Cancer [facility])   ? "RIGHT LEG SQUAMOUS CELL"  ? Closed fracture of left distal radius 10/25/2018  ? COPD (chronic obstructive pulmonary disease) ([place])   ? Depression   ? GERD (gastroesophageal reflux disease)   ? Headache   ? Hypercholesteremia   ? Hypertension   ? IBS (irritable bowel syndrome)   ? Sleep apnea   ? Tobacco abuse   ? Vitamin D deficiency   ? ? ?Past Surgical History:  ?Procedure Laterality Date  ? APPENDECTOMY    ? COLONOSCOPY  08/26/2015  ? Colonic polyps status post polypectomy. Minimal sigmoid diverticulosis.   ? ESOPHAGOGASTRODUODENOSCOPY  02/25/2011  ? Large hiatal hernia otherwise normal EGD.  ? OPEN REDUCTION INTERNAL FIXATION (ORIF) DISTAL RADIAL FRACTURE Left 10/25/2018  ? Procedure: OPEN REDUCTION INTERNAL FIXATION (ORIF)LEFT  DISTAL RADIAL FRACTURE;  Surgeon: Marchia Bond, MD;  Location: [facility];  Service: Orthopedics;  Laterality: Left;  ? POLYPECTOMY    ? VAGINAL HYSTERECTOMY    ? ? ?Family History  ?Problem Relation Age of Onset  ? CAD Mother 74  ? Alzheimer's disease Father 32  ? Diabetes Father   ? Colon cancer Neg Hx   ? Esophageal cancer Neg Hx   ? Colon polyps Neg Hx   ? Rectal cancer Neg Hx   ? Stomach cancer Neg Hx   ? ? ?Social History  ? ?Socioeconomic History  ? Marital status: Married  ?  Spouse name: Not on file  ? Number of children: 2  ? Years of education: Not on file  ? Highest  education level: Not on file  ?Occupational History  ? Not on file  ?Tobacco Use  ? Smoking status: Former  ?  Packs/day: 1.00  ?  Years: 44.00  ?  Pack years: 44.00  ?  Types: Cigarettes  ?  Quit date: 12/19/2019  ?  Years since quitting: 1.6  ?  Passive exposure: Current  ? Smokeless tobacco: Never  ? Tobacco comments:  ?  in process of quitting  ?Vaping Use  ? Vaping Use: Former  ? Devices: did it for 6 months and stopped   ?Substance and Sexual Activity  ? Alcohol use: No  ?  Alcohol/week: 0.0 standard drinks  ? Drug use: Yes  ?  Types: Marijuana  ?  Comment: 2 puffs per day-WHEN SICK  ? Sexual activity: Not Currently  ?Other Topics Concern  ? Not on file  ?Social History Narrative  ? Works at [employer].   ? ?Social Determinants of Health  ? ?Financial Resource Strain: Not on file  ?Food Insecurity: Not on file  ?Transportation Needs: Not on file  ?Physical Activity: Not on file  ?Stress: Not on file  ?Social Connections: Not on file  ?Intimate Partner Violence: Not on file  ? ? ?  ROS ?Review of Systems  ?Genitourinary:  Positive for frequency and urgency. Negative for dysuria.  ?Musculoskeletal:  Positive for arthralgias.  ?Psychiatric/Behavioral:  Positive for sleep disturbance. Negative for self-injury and suicidal ideas. The patient is nervous/anxious.   ?All other systems reviewed and are negative. ? ?Objective:  ? ?Today's Vitals: BP 118/77 (BP Location: Left Arm, Patient Position: Sitting, Cuff Size: Normal)   Pulse 69   Temp 98.1 ?F (36.7 ?C) (Oral)   Resp 16   Wt 175 lb (79.4 kg)   SpO2 94%   BMI 32.01 kg/m?  ? ?Physical Exam ?Vitals and nursing note reviewed.  ?Constitutional:   ?   General: She is not in acute distress. ?   Appearance: She is obese.  ?Cardiovascular:  ?   Rate and Rhythm: Normal rate and regular rhythm.  ?Pulmonary:  ?   Effort: Pulmonary effort is normal.  ?   Breath sounds: Normal breath sounds.  ?Abdominal:  ?   Palpations: Abdomen is soft.  ?   Tenderness: There  is no abdominal tenderness.  ?Musculoskeletal:     ?   General: No swelling or deformity.  ?   Right lower leg: No edema.  ?   Left lower leg: No edema.  ?Neurological:  ?   General: No focal deficit present.  ?   Mental Status: She is alert and oriented to person, place, and time.  ?Psychiatric:     ?   Mood and Affect: Affect normal. Mood is anxious.     ?   Speech: Speech normal.     ?   Behavior: Behavior normal. Behavior is cooperative.  ? ? ?Assessment & Plan:  ? ?1. Depression with anxiety ?Improved and appears stable with present managemnen. Continue and monitot ? ?2. Arthralgia, unspecified joint ?Kenalog IM injection given.  ?- triamcinolone acetonide (KENALOG-40) injection 40 mg ? ?3. Essential hypertension ?Appears stable with present management. continue ? ?4. Urge incontinence of urine ?Detrol '4mg'$  prescribed. monitor ? ?5. Encounter for osteoporosis screening in asymptomatic postmenopausal patient ?Referral for bone density ?- DG Bone Density; Future ? ?6. Encounter for screening mammogram for malignant neoplasm of breast ?Referral for mammogram ?- MM Digital Screening; Future ? ?7. Tobacco abuse counseling ?Discussed reduction/cessation ? ? ? ?Outpatient Encounter Medications as of 08/25/2021  ?Medication Sig  ? acyclovir (ZOVIRAX) 200 MG capsule Take 200 mg by mouth every morning.  ? albuterol (PROVENTIL) (2.5 MG/3ML) 0.083% nebulizer solution Take 2.5 mg by nebulization every 6 (six) hours as needed for wheezing or shortness of breath.  ? albuterol (VENTOLIN HFA) 108 (90 Base) MCG/ACT inhaler Inhale 2 puffs into the lungs every 6 (six) hours as needed for wheezing.  ? buPROPion (WELLBUTRIN XL) 300 MG 24 hr tablet Take 1 tablet (300 mg total) by mouth daily.  ? Cyanocobalamin (VITAMIN B 12 PO) Take 1 tablet by mouth daily.  ? fluticasone (FLONASE) 50 MCG/ACT nasal spray Place 2 sprays into both nostrils daily.  ? Fluticasone-Umeclidin-Vilant (TRELEGY ELLIPTA) 100-62.5-25 MCG/ACT AEPB Inhale 1 puff into  the lungs daily. INHALE 1 PUFF ONCE DAILY  ? gabapentin (NEURONTIN) 300 MG capsule TAKE ONE CAPSULE BY MOUTH IN THE MORNING, 1 CAPSULES IN THE AFTERNOON, AND 2 CAPSULES AT BEDTIME (Patient taking differently: Take 300 mg by mouth See admin instructions. 100 MG MORNING,100 MG AFTERNOON,200 MG)  ? LORazepam (ATIVAN) 0.5 MG tablet Take 1 tablet by mouth twice daily as needed for anxiety (Patient taking differently: Take 0.5 mg by mouth 2 (two) times  daily as needed for anxiety.)  ? losartan (COZAAR) 100 MG tablet Take 100 mg by mouth daily.  ? montelukast (SINGULAIR) 10 MG tablet Take 1 tablet (10 mg total) by mouth at bedtime. (Patient taking differently: Take 10 mg by mouth as needed.)  ? Multiple Vitamin (MULTIVITAMIN WITH MINERALS) TABS tablet Take 1 tablet by mouth daily.  ? Omega-3 Fatty Acids (FISH OIL) 1000 MG CPDR Take 1,000 mg by mouth daily.  ? rosuvastatin (CRESTOR) 40 MG tablet TAKE 1/2 (ONE-HALF) TABLET BY MOUTH AT BEDTIME (Patient taking differently: Take 20 mg by mouth at bedtime.)  ? sertraline (ZOLOFT) 100 MG tablet Take 2 tablets (200 mg total) by mouth daily.  ? celecoxib (CELEBREX) 200 MG capsule Take 200 mg by mouth 2 (two) times daily. (Patient not taking: Reported on 08/17/2021)  ? methocarbamol (ROBAXIN) 500 MG tablet Take 500 mg by mouth 3 (three) times daily as needed for muscle spasms.  ? TRABECTEDIN IV Inject into the vein.  ? ?No facility-administered encounter medications on file as of 08/25/2021.  ? ? ?Follow-up: No follow-ups on file.  ? ?Becky Sax, MD ? ?

## 2021-08-26 ENCOUNTER — Encounter: Payer: Self-pay | Admitting: Family Medicine

## 2021-08-27 MED ORDER — TRELEGY ELLIPTA 100-62.5-25 MCG/ACT IN AEPB: 1.0000 | INHALATION_SPRAY | Freq: Every day | RESPIRATORY_TRACT | 3 refills | Status: DC

## 2021-08-27 MED ORDER — ALBUTEROL SULFATE HFA 108 (90 BASE) MCG/ACT IN AERS: 2.0000 | INHALATION_SPRAY | Freq: Four times a day (QID) | RESPIRATORY_TRACT | 3 refills | Status: DC | PRN

## 2021-08-27 MED ORDER — FLUTICASONE PROPIONATE 50 MCG/ACT NA SUSP: 2.0000 | Freq: Every day | NASAL | 6 refills | Status: DC

## 2021-08-27 MED ORDER — ALBUTEROL SULFATE (2.5 MG/3ML) 0.083% IN NEBU: 2.5000 mg | INHALATION_SOLUTION | Freq: Four times a day (QID) | RESPIRATORY_TRACT | 6 refills | Status: DC | PRN

## 2021-08-27 NOTE — Telephone Encounter (Signed)
ATC patient about prescriptions going to [facility] , left message letting her know they have been sent. Nothing further needed at this time.  ?

## 2021-08-31 ENCOUNTER — Ambulatory Visit (AMBULATORY_SURGERY_CENTER): Payer: Medicare Other | Admitting: Gastroenterology

## 2021-08-31 ENCOUNTER — Encounter: Payer: Self-pay | Admitting: Gastroenterology

## 2021-08-31 ENCOUNTER — Other Ambulatory Visit: Payer: Self-pay

## 2021-08-31 VITALS — BP 126/82 | HR 52 | Temp 96.2°F | Resp 16 | Ht 62.0 in | Wt 175.0 lb

## 2021-08-31 DIAGNOSIS — R197 Diarrhea, unspecified: Secondary | ICD-10-CM | POA: Diagnosis not present

## 2021-08-31 DIAGNOSIS — K52832 Lymphocytic colitis: Secondary | ICD-10-CM

## 2021-08-31 DIAGNOSIS — K5730 Diverticulosis of large intestine without perforation or abscess without bleeding: Secondary | ICD-10-CM | POA: Diagnosis not present

## 2021-08-31 DIAGNOSIS — J449 Chronic obstructive pulmonary disease, unspecified: Secondary | ICD-10-CM | POA: Diagnosis not present

## 2021-08-31 DIAGNOSIS — K643 Fourth degree hemorrhoids: Secondary | ICD-10-CM | POA: Diagnosis not present

## 2021-08-31 DIAGNOSIS — Z86010 Personal history of colonic polyps: Secondary | ICD-10-CM

## 2021-08-31 MED ORDER — SODIUM CHLORIDE 0.9 % IV SOLN: 500.0000 mL | INTRAVENOUS | Status: DC

## 2021-08-31 MED ORDER — HYDROCORTISONE (PERIANAL) 2.5 % EX CREA: 1.0000 "application " | TOPICAL_CREAM | Freq: Two times a day (BID) | CUTANEOUS | 2 refills | Status: AC

## 2021-08-31 NOTE — Progress Notes (Signed)
Called to room to assist during endoscopic procedure.  Patient ID and intended procedure confirmed with present staff. Received instructions for my participation in the procedure from the performing physician.  

## 2021-08-31 NOTE — Op Note (Signed)
[facility] ?Patient Name: Tiffany Murphy ?Procedure Date: 08/31/2021 1:32 PM ?MRN: [id number] ?Endoscopist: Jackquline Denmark , MD ?Age: 71 ?Referring MD:  ?Date of Birth: [birth year] ?Gender: Female ?Account #: [id number] ?Procedure:                Colonoscopy ?Indications:              High risk colon cancer surveillance: Personal  ?                          history of colonic polyps. H/O diarrhea. Recent  ?                          acute gastroenteritis ?Medicines:                Monitored Anesthesia Care ?Procedure:                Pre-Anesthesia Assessment: ?                          - Prior to the procedure, a History and Physical  ?                          was performed, and patient medications and  ?                          allergies were reviewed. The patient's tolerance of  ?                          previous anesthesia was also reviewed. The risks  ?                          and benefits of the procedure and the sedation  ?                          options and risks were discussed with the patient.  ?                          All questions were answered, and informed consent  ?                          was obtained. Prior Anticoagulants: The patient has  ?                          taken no previous anticoagulant or antiplatelet  ?                          agents. ASA Grade Assessment: II - A patient with  ?                          mild systemic disease. After reviewing the risks  ?                          and benefits, the patient was deemed in  ?  satisfactory condition to undergo the procedure. ?                          After obtaining informed consent, the colonoscope  ?                          was passed under direct vision. Throughout the  ?                          procedure, the patient's blood pressure, pulse, and  ?                          oxygen saturations were monitored continuously. The  ?                          Olympus CF-HQ190L Colonoscope was introduced   ?                          through the anus and advanced to the 2 cm into the  ?                          ileum. The colonoscopy was performed without  ?                          difficulty. The patient tolerated the procedure  ?                          well. The quality of the bowel preparation was  ?                          good. The terminal ileum, ileocecal valve,  ?                          appendiceal orifice, and rectum were photographed. ?Scope In: 2:47:53 PM ?Scope Out: 3:04:01 PM ?Scope Withdrawal Time: 0 hours 10 minutes 31 seconds  ?Total Procedure Duration: 0 hours 16 minutes 8 seconds  ?Findings:                 1 channel of grade 4 internal hemorrhoids were  ?                          found on perianal exam. Good rectal tone. ?                          A few small-mouthed diverticula were found in the  ?                          sigmoid colon. ?                          The colon (entire examined portion) appeared  ?                          normal. Biopsies were taken with a cold forceps for  ?  histology. ?                          The terminal ileum appeared normal. ?                          Non-bleeding internal hemorrhoids were found during  ?                          retroflexion and during perianal exam. The  ?                          hemorrhoids were moderate and Grade IV (internal  ?                          hemorrhoids that prolapse and cannot be reduced  ?                          manually). ?                          The exam was otherwise without abnormality on  ?                          direct and retroflexion views. ?Complications:            No immediate complications. ?Estimated Blood Loss:     Estimated blood loss: none. ?Impression:               - Mild sigmoid diverticulosis. ?                          - The entire examined colon is normal. Biopsied. ?                          - The examined portion of the ileum was normal. ?                          -  Non-bleeding internal hemorrhoids. ?                          - The examination was otherwise normal on direct  ?                          and retroflexion views. ?Recommendation:           - Patient has a contact number available for  ?                          emergencies. The signs and symptoms of potential  ?                          delayed complications were discussed with the  ?                          patient. Return to normal activities tomorrow.  ?  Written discharge instructions were provided to the  ?                          patient. ?                          - Resume previous diet. ?                          - Continue present medications. ?                          - Await pathology results. ?                          - HC 2.5 % PR BID x 10 days, 2RF ?                          - Repeat colonoscopy is not recommended for  ?                          screening purposes. ?                          - The findings and recommendations were discussed  ?                          with the patient's family. ?Jackquline Denmark, MD ?08/31/2021 3:13:12 PM ?This report has been signed electronically. ?

## 2021-08-31 NOTE — Patient Instructions (Signed)
YOU HAD AN ENDOSCOPIC PROCEDURE TODAY AT THE [facility]:   Refer to the procedure report that was given to you for any specific questions about what was found during the examination.  If the procedure report does not answer your questions, please call your gastroenterologist to clarify.  If you requested that your care partner not be given the details of your procedure findings, then the procedure report has been included in a sealed envelope for you to review at your convenience later.  YOU SHOULD EXPECT: Some feelings of bloating in the abdomen. Passage of more gas than usual.  Walking can help get rid of the air that was put into your GI tract during the procedure and reduce the bloating. If you had a lower endoscopy (such as a colonoscopy or flexible sigmoidoscopy) you may notice spotting of blood in your stool or on the toilet paper. If you underwent a bowel prep for your procedure, you may not have a normal bowel movement for a few days.  Please Note:  You might notice some irritation and congestion in your nose or some drainage.  This is from the oxygen used during your procedure.  There is no need for concern and it should clear up in a day or so.  SYMPTOMS TO REPORT IMMEDIATELY:   Following lower endoscopy (colonoscopy or flexible sigmoidoscopy):  Excessive amounts of blood in the stool  Significant tenderness or worsening of abdominal pains  Swelling of the abdomen that is new, acute  Fever of 100F or higher  For urgent or emergent issues, a gastroenterologist can be reached at any hour by calling [phone]. Do not use MyChart messaging for urgent concerns.    DIET:  We do recommend a small meal at first, but then you may proceed to your regular diet.  Drink plenty of fluids but you should avoid alcoholic beverages for 24 hours.  ACTIVITY:  You should plan to take it easy for the rest of today and you should NOT DRIVE or use heavy machinery until tomorrow (because  of the sedation medicines used during the test).    FOLLOW UP: Our staff will call the number listed on your records 48-72 hours following your procedure to check on you and address any questions or concerns that you may have regarding the information given to you following your procedure. If we do not reach you, we will leave a message.  We will attempt to reach you two times.  During this call, we will ask if you have developed any symptoms of COVID 19. If you develop any symptoms (ie: fever, flu-like symptoms, shortness of breath, cough etc.) before then, please call [phone].  If you test positive for Covid 19 in the 2 weeks post procedure, please call and report this information to us.    If any biopsies were taken you will be contacted by phone or by letter within the next 1-3 weeks.  Please call us at [phone] if you have not heard about the biopsies in 3 weeks.    SIGNATURES/CONFIDENTIALITY: You and/or your care partner have signed paperwork which will be entered into your electronic medical record.  These signatures attest to the fact that that the information above on your After Visit Summary has been reviewed and is understood.  Full responsibility of the confidentiality of this discharge information lies with you and/or your care-partner. 

## 2021-08-31 NOTE — Progress Notes (Signed)
? ? ?Chief Complaint: FU ? ?Referring Provider:  Dorna Mai, MD    ? ? ?ASSESSMENT AND PLAN;  ? ?#1.  IBS with diarhea. Neg colon with TI and random colonic biopsies 08/2015.  Did have polyps. ? ?#2. H/O tubular adenomas (last colon 08/2015). Next due 08/20/2020 unless red flag symptoms. ? ?Plan: ?- bentyl '10mg'$  po qid #120, 6 refills ?- I have instructed patient to stop smoking.  Have discussed risks associated with smoking including risks of various cancers. ?- FU prn ? ? ?HPI:   ? ?Tiffany Murphy is a 71 y.o. female  ?For follow-up visit ?Here to get refill for Bentyl. ?Doing well on 4/day ?Occasional diarrhea and abdominal pain but much better. ?Continues to smoke despite medical advice. ? ?Past GI history: ?-Colonoscopy 08/26/2015 colonic polyp status post polypectomy (tubular adenomas and hyperplastic polyps), mild sigmoid diverticulosis.  Otherwise normal to TI.  Negative random TI and colonic biopsies.  She had negative CT/PET CT at that time. ?-EGD 02/2011 by Dr. Paul Half hiatal hernia.  Otherwise normal. ?Past Medical History:  ?Diagnosis Date  ? Allergy   ? "THINK I GOT THEM"  ? Anxiety   ? Arthritis   ? "BODY FULL OF ARTHRITIS"  ? Asthma   ? Bronchitis   ? Cancer [facility])   ? "RIGHT LEG SQUAMOUS CELL"  ? Closed fracture of left distal radius 10/25/2018  ? COPD (chronic obstructive pulmonary disease) ([place])   ? Depression   ? GERD (gastroesophageal reflux disease)   ? Headache   ? Hypercholesteremia   ? Hypertension   ? IBS (irritable bowel syndrome)   ? Sleep apnea   ? Tobacco abuse   ? Vitamin D deficiency   ? ? ?Past Surgical History:  ?Procedure Laterality Date  ? APPENDECTOMY    ? COLONOSCOPY  08/26/2015  ? Colonic polyps status post polypectomy. Minimal sigmoid diverticulosis.   ? ESOPHAGOGASTRODUODENOSCOPY  02/25/2011  ? Large hiatal hernia otherwise normal EGD.  ? OPEN REDUCTION INTERNAL FIXATION (ORIF) DISTAL RADIAL FRACTURE Left 10/25/2018  ? Procedure: OPEN REDUCTION INTERNAL FIXATION  (ORIF)LEFT  DISTAL RADIAL FRACTURE;  Surgeon: Marchia Bond, MD;  Location: [facility];  Service: Orthopedics;  Laterality: Left;  ? POLYPECTOMY    ? VAGINAL HYSTERECTOMY    ? ? ?Family History  ?Problem Relation Age of Onset  ? CAD Mother 71  ? Alzheimer's disease Father 78  ? Diabetes Father   ? Colon cancer Neg Hx   ? Esophageal cancer Neg Hx   ? Colon polyps Neg Hx   ? Rectal cancer Neg Hx   ? Stomach cancer Neg Hx   ? ? ?Social History  ? ?Tobacco Use  ? Smoking status: Former  ?  Packs/day: 1.00  ?  Years: 44.00  ?  Pack years: 44.00  ?  Types: Cigarettes  ?  Quit date: 12/19/2019  ?  Years since quitting: 1.7  ?  Passive exposure: Current  ? Smokeless tobacco: Never  ? Tobacco comments:  ?  in process of quitting  ?Vaping Use  ? Vaping Use: Former  ? Devices: did it for 6 months and stopped   ?Substance Use Topics  ? Alcohol use: No  ?  Alcohol/week: 0.0 standard drinks  ? Drug use: Yes  ?  Types: Marijuana  ?  Comment: 2 puffs per day-WHEN SICK  ? ? ?Current Outpatient Medications  ?Medication Sig Dispense Refill  ? buPROPion (WELLBUTRIN XL) 300 MG 24 hr tablet Take 1 tablet (300  mg total) by mouth daily. 90 tablet 0  ? Cyanocobalamin (VITAMIN B 12 PO) Take 1 tablet by mouth daily.    ? fluticasone (FLONASE) 50 MCG/ACT nasal spray Place 2 sprays into both nostrils daily. 16 g 6  ? Fluticasone-Umeclidin-Vilant (TRELEGY ELLIPTA) 100-62.5-25 MCG/ACT AEPB Inhale 1 puff into the lungs daily. INHALE 1 PUFF ONCE DAILY 180 each 3  ? gabapentin (NEURONTIN) 300 MG capsule TAKE ONE CAPSULE BY MOUTH IN THE MORNING, 1 CAPSULES IN THE AFTERNOON, AND 2 CAPSULES AT BEDTIME (Patient taking differently: Take 300 mg by mouth See admin instructions. 100 MG MORNING,100 MG AFTERNOON,200 MG) 360 capsule 0  ? losartan (COZAAR) 100 MG tablet Take 100 mg by mouth daily.    ? Multiple Vitamin (MULTIVITAMIN WITH MINERALS) TABS tablet Take 1 tablet by mouth daily.    ? Omega-3 Fatty Acids (FISH OIL) 1000 MG CPDR Take 1,000  mg by mouth daily.    ? rosuvastatin (CRESTOR) 40 MG tablet TAKE 1/2 (ONE-HALF) TABLET BY MOUTH AT BEDTIME (Patient taking differently: Take 20 mg by mouth at bedtime.) 45 tablet 0  ? sertraline (ZOLOFT) 100 MG tablet Take 2 tablets (200 mg total) by mouth daily. 180 tablet 1  ? acyclovir (ZOVIRAX) 200 MG capsule Take 200 mg by mouth every morning.    ? albuterol (PROVENTIL) (2.5 MG/3ML) 0.083% nebulizer solution Take 3 mLs (2.5 mg total) by nebulization every 6 (six) hours as needed for wheezing or shortness of breath. 75 mL 6  ? albuterol (VENTOLIN HFA) 108 (90 Base) MCG/ACT inhaler Inhale 2 puffs into the lungs every 6 (six) hours as needed for wheezing. 1 each 3  ? LORazepam (ATIVAN) 0.5 MG tablet Take 1 tablet by mouth twice daily as needed for anxiety (Patient taking differently: Take 0.5 mg by mouth 2 (two) times daily as needed for anxiety.) 30 tablet 0  ? montelukast (SINGULAIR) 10 MG tablet Take 1 tablet (10 mg total) by mouth at bedtime. (Patient taking differently: Take 10 mg by mouth as needed.) 90 tablet 1  ? tolterodine (DETROL LA) 4 MG 24 hr capsule Take 1 capsule (4 mg total) by mouth daily. 30 capsule 0  ? ?Current Facility-Administered Medications  ?Medication Dose Route Frequency Provider Last Rate Last Admin  ? 0.9 %  sodium chloride infusion  500 mL Intravenous Continuous Jackquline Denmark, MD      ? ? ?Allergies  ?Allergen Reactions  ? Penicillins Hives and Shortness Of Breath  ? Lisinopril Other (See Comments)  ?  Makes feel bad  ? Lovastatin Other (See Comments)  ?  Cramps in legs and feet  ? Other Other (See Comments)  ?  Fragrances in soaps, perfumes, shampoos, conditioners  ? ? ?Review of Systems:  ?Constitutional: Denies fever, chills, diaphoresis, appetite change and fatigue.  ?HEENT: Denies photophobia, eye pain, redness, hearing loss, ear pain, congestion, sore throat, rhinorrhea, sneezing, mouth sores, neck pain, neck stiffness and tinnitus.   ?Respiratory: Denies SOB, DOE, cough, chest  tightness,  and wheezing.   ?Cardiovascular: Denies chest pain, palpitations and leg swelling.  ?Genitourinary: Denies dysuria, urgency, frequency, hematuria, flank pain and difficulty urinating.  ?Musculoskeletal: Denies myalgias, back pain, joint swelling, arthralgias and gait problem.  ?Skin: No rash.  ?Neurological: Denies dizziness, seizures, syncope, weakness, light-headedness, numbness and headaches.  ?Hematological: Denies adenopathy. Easy bruising, personal or family bleeding history  ?Psychiatric/Behavioral: No anxiety or depression ? ?  ? ?Physical Exam:   ? ?BP (!) 148/67   Pulse 72   Temp (!)  96.2 ?F (35.7 ?C) (Temporal)   Resp 16   Ht '5\' 2"'$  (1.575 m)   Wt 175 lb (79.4 kg)   SpO2 98%   BMI 32.01 kg/m?  ?Filed Weights  ? 08/31/21 1348  ?Weight: 175 lb (79.4 kg)  ? ?Constitutional:  Well-developed, in no acute distress. ?Psychiatric: Normal mood and affect. Behavior is normal. ?HEENT: Pupils normal.  Conjunctivae are normal. No scleral icterus. ?Neck supple.  ?Cardiovascular: Normal rate, regular rhythm. No edema ?Pulmonary/chest: B/L decreased BS ?Abdominal: Soft, nondistended. Nontender. Bowel sounds active throughout. There are no masses palpable. No hepatomegaly. ?Rectal:  defered ?Neurological: Alert and oriented to person place and time. ?Skin: Skin is warm and dry. No rashes noted. ? ?Data Reviewed: I have personally reviewed following labs and imaging studies ? ?CBC: ?CBC Latest Ref Rng & Units 03/20/2021 03/19/2021 03/18/2021  ?WBC 4.0 - 10.5 K/uL 4.6 3.8(L) 7.2  ?Hemoglobin 12.0 - 15.0 g/dL 12.8 13.5 13.7  ?Hematocrit 36.0 - 46.0 % 38.7 40.4 41.8  ?Platelets 150 - 400 K/uL 130(L) 99(L) 107(L)  ? ? ?CMP: ?CMP Latest Ref Rng & Units 03/20/2021 03/19/2021 03/18/2021  ?Glucose 70 - 99 mg/dL 85 93 120(H)  ?BUN 8 - 23 mg/dL '11 13 14  '$ ?Creatinine 0.44 - 1.00 mg/dL 0.76 0.78 0.88  ?Sodium 135 - 145 mmol/L 141 140 138  ?Potassium 3.5 - 5.1 mmol/L 3.6 3.2(L) 3.3(L)  ?Chloride 98 - 111 mmol/L 113(H) 111  110  ?CO2 22 - 32 mmol/L 22 21(L) 21(L)  ?Calcium 8.9 - 10.3 mg/dL 8.2(L) 8.2(L) 7.9(L)  ?Total Protein 6.5 - 8.1 g/dL - - -  ?Total Bilirubin 0.3 - 1.2 mg/dL - - -  ?Alkaline Phos 38 - 126 U/L - - -  ?AST 15 - 41 U/

## 2021-08-31 NOTE — Progress Notes (Signed)
To Pacu, VSS. Report to [email or web address] 

## 2021-08-31 NOTE — Progress Notes (Signed)
Pt's states no medical or surgical changes since previsit or office visit. 

## 2021-09-01 ENCOUNTER — Telehealth: Payer: Self-pay | Admitting: Family Medicine

## 2021-09-01 NOTE — Telephone Encounter (Signed)
Copied from [place] [id number]. Topic: General - Other ?>> Sep 01, 2021  1:18 PM Yvette Rack wrote: ?Reason for CRM: Pt stated she had a missed call from the office so she was returning the call. Pt requests call back. Cb# [phone] ?

## 2021-09-01 NOTE — Telephone Encounter (Signed)
Patient was called back and msg left. It is to early for refills on medication and in my notes it also says that patient is not taking tolterodine '4mg'$ . Please clarify. ?

## 2021-09-01 NOTE — Telephone Encounter (Signed)
Received a voicemail from Sam at [facility] requesting escribe refills on several medications.  ?sertraline (ZOLOFT) 100 MG tablet [[id number] ? ?tolterodine (DETROL LA) 4 MG 24 hr capsule [[id number] ? ?montelukast (SINGULAIR) 10 MG tablet [[id number] ? ?buPROPion (WELLBUTRIN XL) 300 MG 24 hr tablet [[id number] ? ?Also asked for Omeprazole but I do not see it listed. ? ? ? ?[facility] - [place], [address] Phone:  [phone]  ?Fax:  [phone]  ?  ? ?

## 2021-09-02 ENCOUNTER — Other Ambulatory Visit: Payer: Self-pay

## 2021-09-02 ENCOUNTER — Telehealth: Payer: Self-pay | Admitting: *Deleted

## 2021-09-02 ENCOUNTER — Encounter: Payer: Self-pay | Admitting: Emergency Medicine

## 2021-09-02 ENCOUNTER — Ambulatory Visit
Admission: EM | Admit: 2021-09-02 | Discharge: 2021-09-02 | Disposition: A | Discharge status: Home / Self Care | Payer: Medicare Other | Attending: Internal Medicine | Admitting: Internal Medicine

## 2021-09-02 ENCOUNTER — Ambulatory Visit (INDEPENDENT_AMBULATORY_CARE_PROVIDER_SITE_OTHER): Payer: Medicare Other

## 2021-09-02 DIAGNOSIS — I700 Atherosclerosis of aorta: Secondary | ICD-10-CM | POA: Diagnosis not present

## 2021-09-02 DIAGNOSIS — M79602 Pain in left arm: Secondary | ICD-10-CM

## 2021-09-02 DIAGNOSIS — M25512 Pain in left shoulder: Secondary | ICD-10-CM | POA: Diagnosis not present

## 2021-09-02 DIAGNOSIS — W19XXXA Unspecified fall, initial encounter: Secondary | ICD-10-CM

## 2021-09-02 DIAGNOSIS — M7989 Other specified soft tissue disorders: Secondary | ICD-10-CM | POA: Diagnosis not present

## 2021-09-02 DIAGNOSIS — M25712 Osteophyte, left shoulder: Secondary | ICD-10-CM | POA: Diagnosis not present

## 2021-09-02 DIAGNOSIS — M25522 Pain in left elbow: Secondary | ICD-10-CM

## 2021-09-02 DIAGNOSIS — M778 Other enthesopathies, not elsewhere classified: Secondary | ICD-10-CM | POA: Diagnosis not present

## 2021-09-02 NOTE — ED Provider Notes (Addendum)
?[facility] ? ? ? ?CSN: [id number] ?Arrival date & time: 09/02/21  1440 ? ? ?  ? ?History   ?Chief Complaint ?Chief Complaint  ?Patient presents with  ? Arm Pain  ? ? ?HPI ?Tiffany Murphy is a 71 y.o. female.  ? ?Patient presents with left arm pain that started approximately 2.5 weeks ago after a fall.  Patient reports that she fell forward and is not sure if she landed on her arm or not.  Denies hitting head or losing consciousness.  Pain started subsequently after fall.  Pain is located in the left shoulder and left elbow.  Has limited range of motion due to pain.  Denies any numbness or tingling. ? ? ?Arm Pain ? ? ?Past Medical History:  ?Diagnosis Date  ? Allergy   ? "THINK I GOT THEM"  ? Anxiety   ? Arthritis   ? "BODY FULL OF ARTHRITIS"  ? Asthma   ? Bronchitis   ? Cancer [facility])   ? "RIGHT LEG SQUAMOUS CELL"  ? Closed fracture of left distal radius 10/25/2018  ? COPD (chronic obstructive pulmonary disease) ([place])   ? Depression   ? GERD (gastroesophageal reflux disease)   ? Headache   ? Hypercholesteremia   ? Hypertension   ? IBS (irritable bowel syndrome)   ? Sleep apnea   ? Tobacco abuse   ? Vitamin D deficiency   ? ? ?Patient Active Problem List  ? Diagnosis Date Noted  ? Colitis 03/17/2021  ? Sepsis ([place]) 03/17/2021  ? AKI (acute kidney injury) ([place]) 03/17/2021  ? Subjective memory complaints 02/21/2019  ? Frequency of urination- pt thinks due to HCTZ BP med 02/21/2019  ? Hyperlipidemia with target LDL less than 70 11/13/2018  ? Closed fracture of left distal radius 10/25/2018  ? Tobacco abuse counseling 07/12/2018  ? Sleep difficulties- poor sleep hygeine 07/12/2018  ? Chronic pain disorder 07/12/2018  ? Chronic joint pain 07/12/2018  ? OSA and COPD overlap syndrome ([place]) 04/30/2018  ? Chronic fatigue 04/30/2018  ? Lung nodule seen on imaging study 04/30/2018  ? Vitamin D deficiency 04/30/2018  ? Tobacco use disorder 04/11/2018  ? Elevated LDL cholesterol level 04/11/2018  ? Chronic obstructive  pulmonary disease ([place]) 04/11/2018  ? GERD (gastroesophageal reflux disease) 04/11/2018  ? Asthma 04/11/2018  ? Obesity, Class I, BMI 30-34.9 04/11/2018  ? Glucose intolerance (impaired glucose tolerance) 04/11/2018  ? Irritable bowel syndrome 04/11/2018  ? Chronic bilateral upper abdominal pain 04/11/2018  ? Panic attacks 04/11/2018  ? Mood disorder ([place]) 04/11/2018  ? Hypokalemia 04/11/2018  ? Environmental and seasonal allergies 04/11/2018  ? Elevated coronary artery calcium score 12/01/2014  ? Benign essential HTN 07/11/2013  ? Depression with anxiety 07/11/2013  ? Pure hypercholesterolemia 07/11/2013  ? Nicotine addiction 06/15/2013  ? Overweight 06/15/2013  ? ? ?Past Surgical History:  ?Procedure Laterality Date  ? APPENDECTOMY    ? COLONOSCOPY  08/26/2015  ? Colonic polyps status post polypectomy. Minimal sigmoid diverticulosis.   ? ESOPHAGOGASTRODUODENOSCOPY  02/25/2011  ? Large hiatal hernia otherwise normal EGD.  ? OPEN REDUCTION INTERNAL FIXATION (ORIF) DISTAL RADIAL FRACTURE Left 10/25/2018  ? Procedure: OPEN REDUCTION INTERNAL FIXATION (ORIF)LEFT  DISTAL RADIAL FRACTURE;  Surgeon: Marchia Bond, MD;  Location: [facility];  Service: Orthopedics;  Laterality: Left;  ? POLYPECTOMY    ? VAGINAL HYSTERECTOMY    ? ? ?OB History   ?No obstetric history on file. ?  ? ? ? ?Home Medications   ? ?Prior  to Admission medications   ?Medication Sig Start Date End Date Taking? Authorizing Provider  ?acyclovir (ZOVIRAX) 200 MG capsule Take 200 mg by mouth every morning. 08/17/21   [provider]  ?albuterol (PROVENTIL) (2.5 MG/3ML) 0.083% nebulizer solution Take 3 mLs (2.5 mg total) by nebulization every 6 (six) hours as needed for wheezing or shortness of breath. 08/27/21   Laurin Coder, MD  ?albuterol (VENTOLIN HFA) 108 (90 Base) MCG/ACT inhaler Inhale 2 puffs into the lungs every 6 (six) hours as needed for wheezing. 08/27/21   Laurin Coder, MD  ?buPROPion (WELLBUTRIN XL) 300 MG 24  hr tablet Take 1 tablet (300 mg total) by mouth daily. 07/26/21   Dorna Mai, MD  ?Cyanocobalamin (VITAMIN B 12 PO) Take 1 tablet by mouth daily.    [provider]  ?fluticasone (FLONASE) 50 MCG/ACT nasal spray Place 2 sprays into both nostrils daily. 08/27/21   Laurin Coder, MD  ?Fluticasone-Umeclidin-Vilant (TRELEGY ELLIPTA) 100-62.5-25 MCG/ACT AEPB Inhale 1 puff into the lungs daily. INHALE 1 PUFF ONCE DAILY 08/27/21   Olalere, Adewale A, MD  ?gabapentin (NEURONTIN) 300 MG capsule TAKE ONE CAPSULE BY MOUTH IN THE MORNING, 1 CAPSULES IN THE AFTERNOON, AND 2 CAPSULES AT BEDTIME ?Patient taking differently: Take 300 mg by mouth See admin instructions. 100 MG MORNING,100 MG AFTERNOON,200 MG 05/12/20   Marrian Salvage, FNP  ?hydrocortisone (ANUSOL-HC) 2.5 % rectal cream Place 1 application. rectally 2 (two) times daily for 10 days. 08/31/21 09/10/21  Jackquline Denmark, MD  ?LORazepam (ATIVAN) 0.5 MG tablet Take 1 tablet by mouth twice daily as needed for anxiety ?Patient taking differently: Take 0.5 mg by mouth 2 (two) times daily as needed for anxiety. 08/19/20   Marrian Salvage, [place]  ?losartan (COZAAR) 100 MG tablet Take 100 mg by mouth daily.    [provider]  ?montelukast (SINGULAIR) 10 MG tablet Take 1 tablet (10 mg total) by mouth at bedtime. ?Patient taking differently: Take 10 mg by mouth as needed. 03/23/20   Marrian Salvage, [place]  ?Multiple Vitamin (MULTIVITAMIN WITH MINERALS) TABS tablet Take 1 tablet by mouth daily.    [provider]  ?Omega-3 Fatty Acids (FISH OIL) 1000 MG CPDR Take 1,000 mg by mouth daily.    [provider]  ?rosuvastatin (CRESTOR) 40 MG tablet TAKE 1/2 (ONE-HALF) TABLET BY MOUTH AT BEDTIME ?Patient taking differently: Take 20 mg by mouth at bedtime. 11/17/20   Marrian Salvage, [place]  ?sertraline (ZOLOFT) 100 MG tablet Take 2 tablets (200 mg total) by mouth daily. 06/08/20   Marrian Salvage, FNP  ?tolterodine (DETROL  LA) 4 MG 24 hr capsule Take 1 capsule (4 mg total) by mouth daily. ?Patient not taking: Reported on 09/02/2021 08/25/21   Dorna Mai, MD  ? ? ?Family History ?Family History  ?Problem Relation Age of Onset  ? CAD Mother 39  ? Alzheimer's disease Father 71  ? Diabetes Father   ? Colon cancer Neg Hx   ? Esophageal cancer Neg Hx   ? Colon polyps Neg Hx   ? Rectal cancer Neg Hx   ? Stomach cancer Neg Hx   ? ? ?Social History ?Social History  ? ?Tobacco Use  ? Smoking status: Former  ?  Packs/day: 1.00  ?  Years: 44.00  ?  Pack years: 44.00  ?  Types: Cigarettes  ?  Quit date: 12/19/2019  ?  Years since quitting: 1.7  ?  Passive exposure: Current  ? Smokeless tobacco:  Never  ? Tobacco comments:  ?  in process of quitting  ?Vaping Use  ? Vaping Use: Former  ? Devices: did it for 6 months and stopped   ?Substance Use Topics  ? Alcohol use: Yes  ? Drug use: Yes  ?  Types: Marijuana  ?  Comment: 2 puffs per day-WHEN SICK  ? ? ? ?Allergies   ?Penicillins, Lisinopril, Lovastatin, and Other ? ? ?Review of Systems ?Review of Systems ?Per HPI ? ?Physical Exam ?Triage Vital Signs ?ED Triage Vitals [09/02/21 1452]  ?Enc Vitals Group  ?   BP   ?   Pulse   ?   Resp   ?   Temp   ?   Temp src   ?   SpO2   ?   Weight   ?   Height   ?   Head Circumference   ?   Peak Flow   ?   Pain Score 8  ?   Pain Loc   ?   Pain Edu?   ?   Excl. in [place]?   ? ?No data found. ? ?Updated Vital Signs ?BP 132/70 (BP Location: Left Arm)   Pulse 70   Temp 98 ?F (36.7 ?C) (Oral)   Resp 18   SpO2 94%  ? ?Visual Acuity ?Right Eye Distance:   ?Left Eye Distance:   ?Bilateral Distance:   ? ?Right Eye Near:   ?Left Eye Near:    ?Bilateral Near:    ? ?Physical Exam ?Constitutional:   ?   General: She is not in acute distress. ?   Appearance: Normal appearance. She is not toxic-appearing or diaphoretic.  ?HENT:  ?   Head: Normocephalic and atraumatic.  ?Eyes:  ?   Extraocular Movements: Extraocular movements intact.  ?   Conjunctiva/sclera: Conjunctivae normal.   ?Pulmonary:  ?   Effort: Pulmonary effort is normal.  ?Musculoskeletal:  ?   Comments: Tenderness to palpation to left lateral shoulder and generalized to left elbow.  No obvious abrasions, lacerations, erythema

## 2021-09-02 NOTE — Telephone Encounter (Signed)
?  Follow up Call- ? ?Call back number 08/31/2021  ?Post procedure Call Back phone  # [phone]  ?Permission to leave phone message Yes  ?Some recent data might be hidden  ?  ? ?Patient questions: ? ?Do you have a fever, pain , or abdominal swelling? No. ?Pain Score  0 * ? ?Have you tolerated food without any problems? Yes.   ? ?Have you been able to return to your normal activities? Yes.   ? ?Do you have any questions about your discharge instructions: ?Diet   No. ?Medications  No. ?Follow up visit  No. ? ?Do you have questions or concerns about your Care? No. ? ?Actions: ?* If pain score is 4 or above: ?No action needed, pain <4. ? ? ?

## 2021-09-02 NOTE — ED Triage Notes (Signed)
Left elbow to left shoulder blade.  Patient unable to lie on that side.  Pain for 2.5 weeks.  Patient reports a fall at that time, landing on face.  A few days later noted pain in left upper arm radiating into back.   ?

## 2021-09-02 NOTE — Discharge Instructions (Signed)
Your x-rays were negative for any acute bony abnormality.  Please use ice application to affected areas.  An Ace wrap has been applied to your elbow.  Please follow-up with orthopedist for further evaluation and management. ?

## 2021-09-02 NOTE — Telephone Encounter (Signed)
Patient called and LVM to call office back.

## 2021-09-03 ENCOUNTER — Other Ambulatory Visit: Payer: Self-pay | Admitting: *Deleted

## 2021-09-03 ENCOUNTER — Telehealth: Payer: Self-pay | Admitting: Family Medicine

## 2021-09-03 MED ORDER — SERTRALINE HCL 100 MG PO TABS: 200.0000 mg | ORAL_TABLET | Freq: Every day | ORAL | 0 refills | Status: DC

## 2021-09-03 NOTE — Telephone Encounter (Signed)
Patient was called and she and I discuss her medication. Patient said that she is having a hard time remembering to take her med's. Patient and I will come up with something to help her remember.  ?

## 2021-09-03 NOTE — Telephone Encounter (Signed)
Copied from [place] [id number]. Topic: General - Other ?>> Sep 03, 2021  8:14 AM Valere Dross wrote: ?Reason for CRM: Pt called in to return Audrey's called, but she was with a pt, Mrs. Cerniglia requested a call back. Please advise. ?

## 2021-09-07 ENCOUNTER — Other Ambulatory Visit: Payer: Self-pay | Admitting: Family Medicine

## 2021-09-07 ENCOUNTER — Ambulatory Visit (INDEPENDENT_AMBULATORY_CARE_PROVIDER_SITE_OTHER): Payer: Medicare Other | Admitting: Clinical

## 2021-09-07 ENCOUNTER — Other Ambulatory Visit: Payer: Self-pay

## 2021-09-07 DIAGNOSIS — J45909 Unspecified asthma, uncomplicated: Secondary | ICD-10-CM

## 2021-09-07 DIAGNOSIS — F331 Major depressive disorder, recurrent, moderate: Secondary | ICD-10-CM | POA: Diagnosis not present

## 2021-09-07 DIAGNOSIS — F411 Generalized anxiety disorder: Secondary | ICD-10-CM

## 2021-09-07 DIAGNOSIS — J3089 Other allergic rhinitis: Secondary | ICD-10-CM

## 2021-09-07 MED ORDER — SERTRALINE HCL 100 MG PO TABS: 200.0000 mg | ORAL_TABLET | Freq: Every day | ORAL | 0 refills | Status: DC

## 2021-09-07 MED ORDER — MONTELUKAST SODIUM 10 MG PO TABS: 10.0000 mg | ORAL_TABLET | Freq: Every day | ORAL | 0 refills | Status: DC

## 2021-09-07 MED ORDER — BUPROPION HCL ER (XL) 300 MG PO TB24: 300.0000 mg | ORAL_TABLET | Freq: Every day | ORAL | 0 refills | Status: DC

## 2021-09-08 ENCOUNTER — Ambulatory Visit: Payer: Medicare Other | Admitting: Family Medicine

## 2021-09-08 NOTE — BH Specialist Note (Signed)
Integrated Behavioral Health Follow Up In-Person Visit ? ?MRN: [id number] ?Name: Tiffany Murphy ? ?Number of [place] Clinician visits: 2- Second Visit ? ?Session Start time: 1696 ?  ?Session End time: 7893 ? ?Total time in minutes: 63 ? ? ?Types of Service: Individual psychotherapy ? ?Interpretor:No. Interpretor Name and Language: N/A ? ?Subjective: ?Tiffany Murphy is a 71 y.o. female accompanied by  self ?Patient was referred by Dorna Mai, MD for depression and anxiety.Marland Kitchen ?Patient reports the following symptoms/concerns: Reports feeling depressed, trouble sleeping, decreased energy, self-esteem disturbances, trouble concentrating, fidgeting, anxiousness, excessive worrying, trouble relaxing, restlessness, and irritability. Reports that she is currently overwhelmed with her grand daughter, grand daughter's friend and partner, their children, and friend is staying with pt. Reports that she wants them to leave her home. Reports that she is allowing them to stay with her due to a recent eviction. Reports that they have not been keeping her home clean and have been creating a lot of noise. Reports that she worries that her granddaughters friends partner is using drugs however she has not seen him. Reports that she feels disrespected by her granddaughter frequently. ?Duration of problem: 1+ year; Severity of problem: moderate ? ?Objective: ?Mood: Anxious and Depressed and Affect: Appropriate ?Risk of harm to self or others: No plan to harm self or others Endorses thoughts of being better off dead but denies SI/HI. ? ?Life Context: ?Family and Social: Pt is married with adult children. Her son also supports her financially. ?School/Work: Pt receives [employer].  ?Self-Care: Pt denies substance use. Pt currently takes sertraline.  ?Life Changes: Pt is experiencing marital problems. Pt's granddaughter and her friends moved into her home.  ? ?Patient and/or Family's Strengths/Protective  Factors: ?Sense of purpose ? ?Goals Addressed: ?Patient will: ? Reduce symptoms of: anxiety and depression  ? Increase knowledge and/or ability of: coping skills  ? Demonstrate ability to: Increase adequate support systems for patient/family ? ?Progress towards Goals: ?Ongoing ? ?Interventions: ?Interventions utilized:  Optician, dispensing, CBT Cognitive Behavioral Therapy, and Supportive Counseling ?Standardized Assessments completed: C-SSRS Short, GAD-7, and PHQ 9 ? ?  09/07/2021  ?  2:49 PM 08/17/2021  ?  2:20 PM 10/23/2019  ? 11:00 AM 07/19/2019  ?  8:21 AM  ?GAD 7 : Generalized Anxiety Score  ?Nervous, Anxious, on Edge '3 2 3 2  '$ ?Control/stop worrying '3 2 3 2  '$ ?Worry too much - different things '3 3 3 3  '$ ?Trouble relaxing '3 3 3 3  '$ ?Restless '3 3 3 3  '$ ?Easily annoyed or irritable 3 2 0 0  ?Afraid - awful might happen 2 0 0 1  ?Total GAD 7 Score '20 15 15 14  '$ ?Anxiety Difficulty Extremely difficult  Very difficult Somewhat difficult  ? ?  ? ?  09/07/2021  ?  2:49 PM 08/25/2021  ?  1:09 PM 08/17/2021  ?  2:16 PM 07/26/2021  ?  1:17 PM 05/01/2020  ?  9:18 AM  ?Depression screen PHQ 2/9  ?Decreased Interest 3 0 3 0 2  ?Down, Depressed, Hopeless 3 0 3 0 1  ?PHQ - 2 Score 6 0 6 0 3  ?Altered sleeping 3 0 2  3  ?Tired, decreased energy 3 0 3  3  ?Change in appetite 3 0 3  3  ?Feeling bad or failure about yourself  3 0 3  1  ?Trouble concentrating 3 0 3  2  ?Moving slowly or fidgety/restless 2 0 3  0  ?Suicidal  thoughts 1 0 0  0  ?PHQ-9 Score 24 0 23  15  ?Difficult doing work/chores Very difficult Not difficult at all     ?  ?[place] from 09/07/2021 in Primary Care at [facility] ED from 09/02/2021 in [facility] at [facility]  ED to Hosp-Admission (Discharged) from 03/17/2021 in [place]  ?C-SSRS RISK CATEGORY Low Risk No Risk No Risk  ? ?  ?  ?Patient and/or Family Response: Pt receptive to tx. Pt receptive to psychoeducation  provided on stress. Pt receptive to establishing boundaries with her granddaughter and her friends. Pt receptive to cognitive restructuring. Pt receptive to deep breathing exercises and grounding techniques.  ? ?Patient Centered Plan: ?Patient is on the following Treatment Plan(s): Depression and anxiety ? ?Assessment: ?Endorses thoughts of being better off dead at times however denies SI/HI. Endorses her family living with her as a current stressor. Endorses protective factor as children. Pt provided with crisis resources. Patient currently experiencing stress related to her granddaughter and her friends living with her. Pt appears to frequently put others needs before her. Pt reported concerns that her granddaughter's friends partner is potentially using drugs but has not seen him. LCSW will not complete a CPS report due to pt expressing a concern and is uncertain.   ? ?Patient may benefit from establishing boundaries with her granddaughter. LCSW provided assistance with pt establishing boundaries. LCSW provided psychoeducation on stress. LCSW utilized cognitive restructuring to decrease negative thoughts. LCSW encouraged pt to utilize deep breathing exercises and grounding techniques. LCSW will fu with pt. ? ?Plan: ?Follow up with behavioral health clinician on : 09/28/21 ?Behavioral recommendations: Establish boundaries with granddaughter, utilize deep breathing exercises, and grounding techniques.  ?Referral(s): [place] (In Clinic) ?"From scale of 1-10, how likely are you to follow plan?": 10 ? ?Isai Gottlieb C Neftaly Swiss, LCSW ? ? ?

## 2021-09-10 ENCOUNTER — Other Ambulatory Visit: Payer: Self-pay | Admitting: Family Medicine

## 2021-09-10 DIAGNOSIS — Z1231 Encounter for screening mammogram for malignant neoplasm of breast: Secondary | ICD-10-CM

## 2021-09-12 ENCOUNTER — Encounter: Payer: Self-pay | Admitting: Gastroenterology

## 2021-09-12 NOTE — Progress Notes (Signed)
Please inform the patient. ?Random colon biopsies showed lymphocytic colitis. ?If having significant diarrhea, ?-Start budesonide '9mg'$  po qd x 8 weeks, then if better, '6mg'$  for 2 weeks followed by 3 mg for another 2 weeks. If not better, please call. ?-Avoid NSAIDs. ?-Can use imodium AD on as needed basis. ?Send report to family physician

## 2021-09-13 ENCOUNTER — Other Ambulatory Visit: Payer: Self-pay

## 2021-09-13 DIAGNOSIS — R197 Diarrhea, unspecified: Secondary | ICD-10-CM

## 2021-09-13 DIAGNOSIS — K529 Noninfective gastroenteritis and colitis, unspecified: Secondary | ICD-10-CM

## 2021-09-13 MED ORDER — BUDESONIDE 3 MG PO CPEP: ORAL_CAPSULE | ORAL | 0 refills | Status: AC

## 2021-09-14 ENCOUNTER — Ambulatory Visit
Admission: RE | Admit: 2021-09-14 | Discharge: 2021-09-14 | Disposition: A | Discharge status: Home / Self Care | Payer: Medicare Other | Source: Ambulatory Visit | Attending: Family Medicine | Admitting: Family Medicine

## 2021-09-14 DIAGNOSIS — Z1231 Encounter for screening mammogram for malignant neoplasm of breast: Secondary | ICD-10-CM

## 2021-09-16 ENCOUNTER — Ambulatory Visit
Admission: RE | Admit: 2021-09-16 | Discharge: 2021-09-16 | Disposition: A | Discharge status: Home / Self Care | Payer: Medicare Other | Source: Ambulatory Visit | Attending: Family Medicine | Admitting: Family Medicine

## 2021-09-16 DIAGNOSIS — Z780 Asymptomatic menopausal state: Secondary | ICD-10-CM | POA: Diagnosis not present

## 2021-09-16 DIAGNOSIS — M85831 Other specified disorders of bone density and structure, right forearm: Secondary | ICD-10-CM | POA: Diagnosis not present

## 2021-09-16 DIAGNOSIS — Z13820 Encounter for screening for osteoporosis: Secondary | ICD-10-CM

## 2021-09-20 ENCOUNTER — Ambulatory Visit (INDEPENDENT_AMBULATORY_CARE_PROVIDER_SITE_OTHER): Payer: Medicare Other | Admitting: Family Medicine

## 2021-09-20 ENCOUNTER — Encounter: Payer: Self-pay | Admitting: Family Medicine

## 2021-09-20 VITALS — BP 130/78 | HR 75 | Temp 97.8°F | Resp 16 | Wt 175.6 lb

## 2021-09-20 DIAGNOSIS — F418 Other specified anxiety disorders: Secondary | ICD-10-CM

## 2021-09-20 DIAGNOSIS — I10 Essential (primary) hypertension: Secondary | ICD-10-CM

## 2021-09-20 DIAGNOSIS — E785 Hyperlipidemia, unspecified: Secondary | ICD-10-CM | POA: Diagnosis not present

## 2021-09-20 DIAGNOSIS — K219 Gastro-esophageal reflux disease without esophagitis: Secondary | ICD-10-CM | POA: Diagnosis not present

## 2021-09-20 DIAGNOSIS — N3941 Urge incontinence: Secondary | ICD-10-CM

## 2021-09-20 MED ORDER — OMEPRAZOLE 20 MG PO CPDR: 20.0000 mg | DELAYED_RELEASE_CAPSULE | Freq: Every day | ORAL | 1 refills | Status: DC

## 2021-09-20 NOTE — Progress Notes (Signed)
? ?Established Patient Office Visit ? ?Subjective:  ?Patient ID: Tiffany Murphy, female    DOB: May 17, 1951  Age: 71 y.o. MRN: [id number] ? ?CC:  ?Chief Complaint  ?Patient presents with  ? Follow-up  ? Hypertension  ? Depression  ? ? ?HPI ?Tiffany Murphy presents for routine follow up of chronic med issues. Patient reports that she had to stop the med for bladder control 2/2 SE ? ?Past Medical History:  ?Diagnosis Date  ? Allergy   ? "THINK I GOT THEM"  ? Anxiety   ? Arthritis   ? "BODY FULL OF ARTHRITIS"  ? Asthma   ? Bronchitis   ? Cancer [facility])   ? "RIGHT LEG SQUAMOUS CELL"  ? Closed fracture of left distal radius 10/25/2018  ? COPD (chronic obstructive pulmonary disease) ([place])   ? Depression   ? GERD (gastroesophageal reflux disease)   ? Headache   ? Hypercholesteremia   ? Hypertension   ? IBS (irritable bowel syndrome)   ? Sleep apnea   ? Tobacco abuse   ? Vitamin D deficiency   ? ? ?Past Surgical History:  ?Procedure Laterality Date  ? APPENDECTOMY    ? COLONOSCOPY  08/26/2015  ? Colonic polyps status post polypectomy. Minimal sigmoid diverticulosis.   ? ESOPHAGOGASTRODUODENOSCOPY  02/25/2011  ? Large hiatal hernia otherwise normal EGD.  ? OPEN REDUCTION INTERNAL FIXATION (ORIF) DISTAL RADIAL FRACTURE Left 10/25/2018  ? Procedure: OPEN REDUCTION INTERNAL FIXATION (ORIF)LEFT  DISTAL RADIAL FRACTURE;  Surgeon: Marchia Bond, MD;  Location: [facility];  Service: Orthopedics;  Laterality: Left;  ? POLYPECTOMY    ? VAGINAL HYSTERECTOMY    ? ? ?Family History  ?Problem Relation Age of Onset  ? CAD Mother 46  ? Alzheimer's disease Father 1  ? Diabetes Father   ? Colon cancer Neg Hx   ? Esophageal cancer Neg Hx   ? Colon polyps Neg Hx   ? Rectal cancer Neg Hx   ? Stomach cancer Neg Hx   ? ? ?Social History  ? ?Socioeconomic History  ? Marital status: Married  ?  Spouse name: Not on file  ? Number of children: 2  ? Years of education: Not on file  ? Highest education level: Not on file  ?Occupational  History  ? Not on file  ?Tobacco Use  ? Smoking status: Former  ?  Packs/day: 1.00  ?  Years: 44.00  ?  Pack years: 44.00  ?  Types: Cigarettes  ?  Quit date: 12/19/2019  ?  Years since quitting: 1.7  ?  Passive exposure: Current  ? Smokeless tobacco: Never  ? Tobacco comments:  ?  in process of quitting  ?Vaping Use  ? Vaping Use: Former  ? Devices: did it for 6 months and stopped   ?Substance and Sexual Activity  ? Alcohol use: Yes  ? Drug use: Yes  ?  Types: Marijuana  ?  Comment: 2 puffs per day-WHEN SICK  ? Sexual activity: Not Currently  ?Other Topics Concern  ? Not on file  ?Social History Narrative  ? Works at [employer].   ? ?Social Determinants of Health  ? ?Financial Resource Strain: Not on file  ?Food Insecurity: Not on file  ?Transportation Needs: Not on file  ?Physical Activity: Not on file  ?Stress: Not on file  ?Social Connections: Not on file  ?Intimate Partner Violence: Not on file  ? ? ?ROS ?Review of Systems  ?Genitourinary:  Positive for frequency and urgency.  Negative for dysuria.  ?Musculoskeletal:  Positive for arthralgias.  ?Psychiatric/Behavioral:  Positive for sleep disturbance. Negative for self-injury and suicidal ideas. The patient is nervous/anxious.   ?All other systems reviewed and are negative. ? ?Objective:  ? ?Today's Vitals: BP 130/78   Pulse 75   Temp 97.8 ?F (36.6 ?C) (Oral)   Resp 16   Wt 175 lb 9.6 oz (79.7 kg)   SpO2 94%   BMI 32.12 kg/m?  ? ?Physical Exam ?Vitals and nursing note reviewed.  ?Constitutional:   ?   General: She is not in acute distress. ?   Appearance: She is obese.  ?Cardiovascular:  ?   Rate and Rhythm: Normal rate and regular rhythm.  ?Pulmonary:  ?   Effort: Pulmonary effort is normal.  ?   Breath sounds: Normal breath sounds.  ?Abdominal:  ?   Palpations: Abdomen is soft.  ?   Tenderness: There is no abdominal tenderness.  ?Musculoskeletal:     ?   General: No swelling or deformity.  ?   Right lower leg: No edema.  ?   Left lower leg: No  edema.  ?Neurological:  ?   General: No focal deficit present.  ?   Mental Status: She is alert and oriented to person, place, and time.  ?Psychiatric:     ?   Mood and Affect: Affect normal. Mood is anxious.     ?   Speech: Speech normal.     ?   Behavior: Behavior normal. Behavior is cooperative.  ? ? ?Assessment & Plan:  ? ?1. Depression with anxiety ?Continue meds and counseling ? ?2. Essential hypertension ?Appears stable with present management. Continue  ? ?3. Urge incontinence of urine ?Unable to tolerate med.Patient will defer agent change for now ? ?4. Hyperlipidemia with target LDL less than 70 ?Continue present management ? ?5. Gastroesophageal reflux disease, unspecified whether esophagitis present ?Omeprazole prescribed  ? ?Outpatient Encounter Medications as of 09/20/2021  ?Medication Sig  ? acyclovir (ZOVIRAX) 200 MG capsule Take 200 mg by mouth every morning.  ? albuterol (PROVENTIL) (2.5 MG/3ML) 0.083% nebulizer solution Take 3 mLs (2.5 mg total) by nebulization every 6 (six) hours as needed for wheezing or shortness of breath.  ? albuterol (VENTOLIN HFA) 108 (90 Base) MCG/ACT inhaler Inhale 2 puffs into the lungs every 6 (six) hours as needed for wheezing.  ? buPROPion (WELLBUTRIN XL) 300 MG 24 hr tablet Take 1 tablet (300 mg total) by mouth daily.  ? celecoxib (CELEBREX) 200 MG capsule Take 200 mg by mouth daily as needed.  ? Cyanocobalamin (VITAMIN B 12 PO) Take 1 tablet by mouth daily.  ? fluticasone (FLONASE) 50 MCG/ACT nasal spray Place 2 sprays into both nostrils daily.  ? Fluticasone-Umeclidin-Vilant (TRELEGY ELLIPTA) 100-62.5-25 MCG/ACT AEPB Inhale 1 puff into the lungs daily. INHALE 1 PUFF ONCE DAILY  ? gabapentin (NEURONTIN) 300 MG capsule TAKE ONE CAPSULE BY MOUTH IN THE MORNING, 1 CAPSULES IN THE AFTERNOON, AND 2 CAPSULES AT BEDTIME (Patient taking differently: Take 300 mg by mouth See admin instructions. 100 MG MORNING,100 MG AFTERNOON,200 MG)  ? losartan (COZAAR) 100 MG tablet Take 100  mg by mouth daily.  ? montelukast (SINGULAIR) 10 MG tablet Take 1 tablet (10 mg total) by mouth at bedtime.  ? Multiple Vitamin (MULTIVITAMIN WITH MINERALS) TABS tablet Take 1 tablet by mouth daily.  ? Omega-3 Fatty Acids (FISH OIL) 1000 MG CPDR Take 1,000 mg by mouth daily.  ? rosuvastatin (CRESTOR) 40 MG tablet TAKE 1/2 (  ONE-HALF) TABLET BY MOUTH AT BEDTIME (Patient taking differently: Take 20 mg by mouth at bedtime.)  ? sertraline (ZOLOFT) 100 MG tablet Take 2 tablets (200 mg total) by mouth daily.  ? budesonide (ENTOCORT EC) 3 MG 24 hr capsule Take 3 capsules (9 mg total) by mouth daily for 56 days, THEN 2 capsules (6 mg total) daily for 14 days, THEN 1 capsule (3 mg total) daily for 14 days.  ? LORazepam (ATIVAN) 0.5 MG tablet Take 1 tablet by mouth twice daily as needed for anxiety (Patient not taking: Reported on 09/20/2021)  ? [DISCONTINUED] methocarbamol (ROBAXIN) 500 MG tablet Take 500 mg by mouth 3 (three) times daily as needed.  ? ?No facility-administered encounter medications on file as of 09/20/2021.  ? ? ?Follow-up: No follow-ups on file.  ? ?Becky Sax, MD ? ?

## 2021-09-20 NOTE — Progress Notes (Signed)
Patient is here for follow-up HTN, Depression, and HDL ?Patient got very emotional talking about her granddaughter that has moved in with her and brought along two other people. ?

## 2021-09-21 LAB — BASIC METABOLIC PANEL
BUN/Creatinine Ratio: 20 (ref 12–28)
BUN: 21 mg/dL (ref 8–27)
CO2: 24 mmol/L (ref 20–29)
Calcium: 10 mg/dL (ref 8.7–10.3)
Chloride: 105 mmol/L (ref 96–106)
Creatinine, Ser: 1.05 mg/dL — ABNORMAL HIGH (ref 0.57–1.00)
Glucose: 90 mg/dL (ref 70–99)
Potassium: 4.5 mmol/L (ref 3.5–5.2)
Sodium: 143 mmol/L (ref 134–144)
eGFR: 56 mL/min/{1.73_m2} — ABNORMAL LOW (ref >59)

## 2021-09-21 LAB — AST: AST: 18 IU/L (ref 0–40)

## 2021-09-21 LAB — LIPID PANEL
Chol/HDL Ratio: 2.5 ratio (ref 0.0–4.4)
Cholesterol, Total: 145 mg/dL (ref 100–199)
HDL: 57 mg/dL (ref >39)
LDL Chol Calc (NIH): 58 mg/dL (ref 0–99)
Triglycerides: 186 mg/dL — ABNORMAL HIGH (ref 0–149)
VLDL Cholesterol Cal: 30 mg/dL (ref 5–40)

## 2021-09-21 LAB — ALT: ALT: 15 IU/L (ref 0–32)

## 2021-09-22 ENCOUNTER — Encounter: Payer: Self-pay | Admitting: Family Medicine

## 2021-09-28 ENCOUNTER — Ambulatory Visit: Payer: Medicare Other | Admitting: Clinical

## 2021-09-28 ENCOUNTER — Telehealth: Payer: Self-pay | Admitting: Clinical

## 2021-09-28 ENCOUNTER — Ambulatory Visit: Payer: Self-pay

## 2021-09-28 NOTE — Telephone Encounter (Signed)
Copied from [place] [id number]. Topic: Appointment Scheduling - Scheduling Inquiry for Clinic ?>> Sep 28, 2021 12:21 PM Oneta Rack wrote: ?Reason for CRM: Since patient had a colonoscopy she has been experiencing abdominal pain and unable to have a bowel movement (patient was transferred over to Nurse Triage) Patient would like to [facility] her 3:30pm appointment with  [facility], ASANTE C ?

## 2021-09-28 NOTE — Telephone Encounter (Signed)
?  Chief Complaint: abdominal pain ?Symptoms: abdominal pain, unable to have BM, distention, rectal bleeding d/t hemorrhoids ?Frequency: since last week ?Pertinent Negatives: NA ?Disposition: '[]'$ ED /'[]'$ Urgent Care (no appt availability in office) / '[x]'$ Appointment(In office/virtual)/ '[]'$  [facility]/ '[]'$ Home Care/ '[]'$ Refused Recommended Disposition /'[]'$  Mobile Bus/ '[]'$  Follow-up with PCP ?Additional Notes: Pt called in stating that she had colonoscopy a couple of weeks and hasn't had a BM since last week and her abdomen has been hurting since Sunday. Pt states she got put on budesonide and it has stopped her up so bad. She has tried to call Dr. Lyndel Safe office but unable to reach anyone. I advised pt no appts for today and if she was hurting that bad could go to ED. Pt states she didn't want go there and didn't have a ride or wasn't going to call EMS to get her. I scheduled appt for tomorrow at 0900. I advised pt if she is unable to come in for this appt to call first thing in the morning to cancel. Also encouraged pt to try suppository or enema and to reach back out to Dr. Lyndel Safe office. Pt verbalized understanding.  ? ?Reason for Disposition ? [1] MILD-MODERATE pain AND [2] constant AND [3] present > 2 hours ? ?Answer Assessment - Initial Assessment Questions ?1. LOCATION: "Where does it hurt?"  ?    abdomen ?3. ONSET: "When did the pain begin?" (e.g., minutes, hours or days ago)  ?    2-3 days ?5. PATTERN "Does the pain come and go, or is it constant?" ?   - If constant: "Is it getting better, staying the same, or worsening?"  ?    (Note: Constant means the pain never goes away completely; most serious pain is constant and it progresses)  ?   - If intermittent: "How long does it last?" "Do you have pain now?" ?    (Note: Intermittent means the pain goes away completely between bouts) ?    Constant  ?6. SEVERITY: "How bad is the pain?"  (e.g., Scale 1-10; mild, moderate, or severe) ?   - MILD (1-3):  doesn't interfere with normal activities, abdomen soft and not tender to touch  ?   - MODERATE (4-7): interferes with normal activities or awakens from sleep, abdomen tender to touch  ?   - SEVERE (8-10): excruciating pain, doubled over, unable to do any normal activities   ?    7 ?10. OTHER SYMPTOMS: "Do you have any other symptoms?" (e.g., back pain, diarrhea, fever, urination pain, vomiting) ?      Unable to have BM, distention, hemorrhoids, and bleeding from rectum ? ?Protocols used: Abdominal Pain - Upper-A-AH ? ?

## 2021-09-29 ENCOUNTER — Encounter: Payer: Self-pay | Admitting: Physician Assistant

## 2021-09-29 ENCOUNTER — Ambulatory Visit (INDEPENDENT_AMBULATORY_CARE_PROVIDER_SITE_OTHER): Payer: Medicare Other | Admitting: Physician Assistant

## 2021-09-29 VITALS — BP 129/73 | HR 84 | Temp 98.9°F | Resp 18 | Ht 62.0 in | Wt 174.0 lb

## 2021-09-29 DIAGNOSIS — R197 Diarrhea, unspecified: Secondary | ICD-10-CM

## 2021-09-29 DIAGNOSIS — R531 Weakness: Secondary | ICD-10-CM | POA: Diagnosis not present

## 2021-09-29 NOTE — Patient Instructions (Signed)
Make sure that you increase your fluids, eat a gentle diet, and get plenty of rest. ? ?Make sure that you follow-up with Dr. Lyndel Safe for further education on this condition. ? ?Kennieth Rad, PA-C ?Physician Assistant ?[place] ?[email or web address] ? ? ?Colitis ?Colitis is a condition in which the colon is inflamed. It can cause diarrhea, blood in the stool, and abdominal pain. Colitis can last a short time (be acute), or it may last a long time (become chronic). ?What are the causes? ?This condition may be caused by: ?Infections from viruses or bacteria. ?A reaction to medicine. ?Certain autoimmune diseases, such as Crohn's disease or ulcerative colitis. ?Radiation treatment. ?Decreased blood flow to the bowel (ischemia). ?What are the signs or symptoms? ?Symptoms of this condition include: ?Diarrhea, blood in the stool, or black, tarry stool. ?Pain in the joints or abdominal pain. ?Fever or fatigue. ?Vomiting. ?Weight loss. ?Bloating. ?Having fewer bowel movements than usual. ?A strong and sudden urge to have a bowel movement. ?Feeling like the bowel is not empty after a bowel movement. ?How is this diagnosed? ?This condition may be diagnosed based on a stool test and a blood test. You may also have other tests, such as: ?X-rays. ?CT scan. ?Colonoscopy. ?Endoscopy. ?Biopsy. ?How is this treated? ?Treatment for this condition depends on the cause. This condition may be treated with: ?Steps to rest the bowel, such as not eating or drinking for a period of time. ?Fluids that are given through an IV. ?Medicine for pain and diarrhea. ?Antibiotic medicines. ?Cortisone medicines. ?Surgery. ?Follow these instructions at home: ?Eating and drinking ? ?Follow instructions from your health care provider about eating or drinking restrictions. ?Drink enough fluid to keep your urine pale yellow. ?Work with a dietitian to determine whether certain foods cause your condition  to flare up. ?Avoid foods or drinks that cause flare-ups. ?Eat a well-balanced diet. ?General instructions ?If you were prescribed an antibiotic medicine, take it as told by your health care provider. Do not stop taking the antibiotic even if you start to feel better. ?Take over-the-counter and prescription medicines only as told by your health care provider. ?Keep all follow-up visits. This is important. ?Contact a health care provider if: ?Your symptoms do not go away. ?You develop new symptoms. ?Get help right away if: ?You have a fever that does not go away with treatment. ?You develop chills. ?You have extreme weakness, fainting, or dehydration. ?You vomit repeatedly. ?You develop severe pain in your abdomen. ?You pass bloody or tarry stool. ?Summary ?Colitis is a condition in which the colon is inflamed. Colitis can last a short time (be acute), or it may last a long time (become chronic). ?Treatment for this condition depends on the cause and may include resting the bowel, taking medicines, or having surgery. ?If you were prescribed an antibiotic medicine, take it as told by your health care provider. Do not stop taking the antibiotic even if you start to feel better. ?Get help right away if you develop severe pain in your abdomen. ?Keep all follow-up visits. This is important. ?This information is not intended to replace advice given to you by your health care provider. Make sure you discuss any questions you have with your health care provider. ?Document Revised: 02/11/2020 Document Reviewed: 02/11/2020 ?Elsevier Patient Education ? [place]. ? ?

## 2021-09-29 NOTE — Progress Notes (Signed)
Patient has had two pieces of chocolate and has not taken medication. ?Patient reports abdominal and rectal pain. ? ?

## 2021-09-29 NOTE — Progress Notes (Signed)
? ?Established Patient Office Visit ? ?Subjective:  ?Patient ID: Tiffany Murphy, female    DOB: [birth year]  Age: 71 y.o. MRN: [id number] ? ?CC:  ?Chief Complaint  ?Patient presents with  ? Abdominal Pain  ? ? ?HPI ?Amanada Murphy reports that she started having abdominal pain and episodes of diarrhea on Sunday morning.  States that she did go to her friend's house the night before, ate [employer], and corn on the cob, no one else at the event became sick.  States that she has not been able to eat or drink since Sunday.  Does endorse that she did start feeling hungry recently, states that she feels her symptoms are starting to resolve slightly. ? ?States that she had a colonoscopy recently and was diagnosed with lymphocytic colitis.  States that she started taking budesonide on September 13, 2021 and is not sure if that is what has caused her to feel sick.  States that she did stop the medication on Sunday.  States that the medication has also disrupted her sleep. ? ? ? ? ?Past Medical History:  ?Diagnosis Date  ? Allergy   ? "THINK I GOT THEM"  ? Anxiety   ? Arthritis   ? "BODY FULL OF ARTHRITIS"  ? Asthma   ? Bronchitis   ? Cancer [facility])   ? "RIGHT LEG SQUAMOUS CELL"  ? Closed fracture of left distal radius 10/25/2018  ? COPD (chronic obstructive pulmonary disease) ([place])   ? Depression   ? GERD (gastroesophageal reflux disease)   ? Headache   ? Hypercholesteremia   ? Hypertension   ? IBS (irritable bowel syndrome)   ? Sleep apnea   ? Tobacco abuse   ? Vitamin D deficiency   ? ? ?Past Surgical History:  ?Procedure Laterality Date  ? APPENDECTOMY    ? COLONOSCOPY  08/26/2015  ? Colonic polyps status post polypectomy. Minimal sigmoid diverticulosis.   ? ESOPHAGOGASTRODUODENOSCOPY  02/25/2011  ? Large hiatal hernia otherwise normal EGD.  ? OPEN REDUCTION INTERNAL FIXATION (ORIF) DISTAL RADIAL FRACTURE Left 10/25/2018  ? Procedure: OPEN REDUCTION INTERNAL FIXATION (ORIF)LEFT  DISTAL RADIAL FRACTURE;  Surgeon: Marchia Bond,  MD;  Location: [place];  Service: Orthopedics;  Laterality: Left;  ? POLYPECTOMY    ? VAGINAL HYSTERECTOMY    ? ? ?Family History  ?Problem Relation Age of Onset  ? CAD Mother 25  ? Alzheimer's disease Father 58  ? Diabetes Father   ? Colon cancer Neg Hx   ? Esophageal cancer Neg Hx   ? Colon polyps Neg Hx   ? Rectal cancer Neg Hx   ? Stomach cancer Neg Hx   ? ? ?Social History  ? ?Socioeconomic History  ? Marital status: Married  ?  Spouse name: Not on file  ? Number of children: 2  ? Years of education: Not on file  ? Highest education level: Not on file  ?Occupational History  ? Not on file  ?Tobacco Use  ? Smoking status: Former  ?  Packs/day: 1.00  ?  Years: 44.00  ?  Pack years: 44.00  ?  Types: Cigarettes  ?  Quit date: 12/19/2019  ?  Years since quitting: 1.7  ?  Passive exposure: Current  ? Smokeless tobacco: Never  ? Tobacco comments:  ?  in process of quitting  ?Vaping Use  ? Vaping Use: Former  ? Devices: did it for 6 months and stopped   ?Substance and Sexual Activity  ? Alcohol use:  Yes  ? Drug use: Yes  ?  Types: Marijuana  ?  Comment: 2 puffs per day-WHEN SICK  ? Sexual activity: Not Currently  ?Other Topics Concern  ? Not on file  ?Social History Narrative  ? Works at [employer].   ? ?Social Determinants of Health  ? ?Financial Resource Strain: Not on file  ?Food Insecurity: Not on file  ?Transportation Needs: Not on file  ?Physical Activity: Not on file  ?Stress: Not on file  ?Social Connections: Not on file  ?Intimate Partner Violence: Not on file  ? ? ?Outpatient Medications Prior to Visit  ?Medication Sig Dispense Refill  ? acyclovir (ZOVIRAX) 200 MG capsule Take 200 mg by mouth every morning.    ? albuterol (PROVENTIL) (2.5 MG/3ML) 0.083% nebulizer solution Take 3 mLs (2.5 mg total) by nebulization every 6 (six) hours as needed for wheezing or shortness of breath. 75 mL 6  ? albuterol (VENTOLIN HFA) 108 (90 Base) MCG/ACT inhaler Inhale 2 puffs into the lungs every 6  (six) hours as needed for wheezing. 1 each 3  ? budesonide (ENTOCORT EC) 3 MG 24 hr capsule Take 3 capsules (9 mg total) by mouth daily for 56 days, THEN 2 capsules (6 mg total) daily for 14 days, THEN 1 capsule (3 mg total) daily for 14 days. 90 capsule 0  ? buPROPion (WELLBUTRIN XL) 300 MG 24 hr tablet Take 1 tablet (300 mg total) by mouth daily. 90 tablet 0  ? celecoxib (CELEBREX) 200 MG capsule Take 200 mg by mouth daily as needed.    ? Cyanocobalamin (VITAMIN B 12 PO) Take 1 tablet by mouth daily.    ? fluticasone (FLONASE) 50 MCG/ACT nasal spray Place 2 sprays into both nostrils daily. 16 g 6  ? Fluticasone-Umeclidin-Vilant (TRELEGY ELLIPTA) 100-62.5-25 MCG/ACT AEPB Inhale 1 puff into the lungs daily. INHALE 1 PUFF ONCE DAILY 180 each 3  ? gabapentin (NEURONTIN) 300 MG capsule TAKE ONE CAPSULE BY MOUTH IN THE MORNING, 1 CAPSULES IN THE AFTERNOON, AND 2 CAPSULES AT BEDTIME (Patient taking differently: Take 300 mg by mouth See admin instructions. 100 MG MORNING,100 MG AFTERNOON,200 MG) 360 capsule 0  ? LORazepam (ATIVAN) 0.5 MG tablet Take 1 tablet by mouth twice daily as needed for anxiety (Patient not taking: Reported on 09/20/2021) 30 tablet 0  ? losartan (COZAAR) 100 MG tablet Take 100 mg by mouth daily.    ? montelukast (SINGULAIR) 10 MG tablet Take 1 tablet (10 mg total) by mouth at bedtime. 90 tablet 0  ? Multiple Vitamin (MULTIVITAMIN WITH MINERALS) TABS tablet Take 1 tablet by mouth daily.    ? Omega-3 Fatty Acids (FISH OIL) 1000 MG CPDR Take 1,000 mg by mouth daily.    ? omeprazole (PRILOSEC) 20 MG capsule Take 1 capsule (20 mg total) by mouth daily. 90 capsule 1  ? rosuvastatin (CRESTOR) 40 MG tablet TAKE 1/2 (ONE-HALF) TABLET BY MOUTH AT BEDTIME (Patient taking differently: Take 20 mg by mouth at bedtime.) 45 tablet 0  ? sertraline (ZOLOFT) 100 MG tablet Take 2 tablets (200 mg total) by mouth daily. 180 tablet 0  ? ?No facility-administered medications prior to visit.  ? ? ?Allergies  ?Allergen  Reactions  ? Penicillins Hives and Shortness Of Breath  ? Lisinopril Other (See Comments)  ?  Makes feel bad  ? Lovastatin Other (See Comments)  ?  Cramps in legs and feet  ? Other Other (See Comments)  ?  Fragrances in soaps, perfumes, shampoos, conditioners  ? ? ?  ROS ?Review of Systems  ?Constitutional:  Negative for chills and fever.  ?HENT: Negative.    ?Eyes: Negative.   ?Respiratory:  Negative for shortness of breath.   ?Cardiovascular:  Negative for chest pain.  ?Gastrointestinal:  Positive for abdominal pain and diarrhea. Negative for vomiting.  ?Endocrine: Negative.   ?Genitourinary: Negative.   ?Musculoskeletal: Negative.   ?Skin: Negative.   ?Allergic/Immunologic: Negative.   ?Neurological:  Positive for weakness.  ?Hematological: Negative.   ?Psychiatric/Behavioral: Negative.    ? ?  ?Objective:  ?  ?Physical Exam ?Vitals and nursing note reviewed.  ?Constitutional:   ?   General: She is not in acute distress. ?   Appearance: Normal appearance. She is obese. She is not ill-appearing.  ?HENT:  ?   Head: Normocephalic and atraumatic.  ?   Right Ear: External ear normal.  ?   Left Ear: External ear normal.  ?   Nose: Nose normal.  ?   Mouth/Throat:  ?   Mouth: Mucous membranes are moist.  ?   Pharynx: Oropharynx is clear.  ?Eyes:  ?   Extraocular Movements: Extraocular movements intact.  ?   Conjunctiva/sclera: Conjunctivae normal.  ?   Pupils: Pupils are equal, round, and reactive to light.  ?Cardiovascular:  ?   Rate and Rhythm: Normal rate and regular rhythm.  ?   Pulses: Normal pulses.  ?   Heart sounds: Normal heart sounds.  ?Pulmonary:  ?   Effort: Pulmonary effort is normal.  ?   Breath sounds: Normal breath sounds.  ?Abdominal:  ?   Tenderness: There is abdominal tenderness.  ?Musculoskeletal:     ?   General: Normal range of motion.  ?   Cervical back: Normal range of motion and neck supple.  ?Skin: ?   General: Skin is warm and dry.  ?Neurological:  ?   General: No focal deficit present.  ?    Mental Status: She is alert and oriented to person, place, and time.  ?Psychiatric:     ?   Mood and Affect: Mood normal.     ?   Behavior: Behavior normal.     ?   Thought Content: Thought content normal.     ?

## 2021-10-05 ENCOUNTER — Ambulatory Visit (INDEPENDENT_AMBULATORY_CARE_PROVIDER_SITE_OTHER): Payer: Medicare Other | Admitting: Orthopaedic Surgery

## 2021-10-05 ENCOUNTER — Encounter: Payer: Self-pay | Admitting: Orthopaedic Surgery

## 2021-10-05 DIAGNOSIS — M25512 Pain in left shoulder: Secondary | ICD-10-CM

## 2021-10-05 DIAGNOSIS — M7022 Olecranon bursitis, left elbow: Secondary | ICD-10-CM

## 2021-10-05 DIAGNOSIS — G8929 Other chronic pain: Secondary | ICD-10-CM

## 2021-10-05 MED ORDER — BUPIVACAINE HCL 0.25 % IJ SOLN
2.0000 mL | INTRAMUSCULAR | Status: AC | PRN
  Administered 2021-10-05: 2 mL via INTRA_ARTICULAR

## 2021-10-05 MED ORDER — METHYLPREDNISOLONE ACETATE 40 MG/ML IJ SUSP
40.0000 mg | INTRAMUSCULAR | Status: AC | PRN
  Administered 2021-10-05: 40 mg via INTRA_ARTICULAR

## 2021-10-05 MED ORDER — LIDOCAINE HCL 1 % IJ SOLN
1.0000 mL | INTRAMUSCULAR | Status: AC | PRN
  Administered 2021-10-05: 1 mL

## 2021-10-05 MED ORDER — BUPIVACAINE HCL 0.25 % IJ SOLN
0.6600 mL | INTRAMUSCULAR | Status: AC | PRN
  Administered 2021-10-05: .66 mL via INTRA_ARTICULAR

## 2021-10-05 MED ORDER — METHYLPREDNISOLONE ACETATE 40 MG/ML IJ SUSP
13.3300 mg | INTRAMUSCULAR | Status: AC | PRN
  Administered 2021-10-05: 13.33 mg via INTRA_ARTICULAR

## 2021-10-05 MED ORDER — LIDOCAINE HCL 2 % IJ SOLN
2.0000 mL | INTRAMUSCULAR | Status: AC | PRN
  Administered 2021-10-05: 2 mL

## 2021-10-05 NOTE — Progress Notes (Signed)
? ?Office Visit Note ?  ?Patient: Tiffany Murphy           ?Date of Birth: 04-12-1951           ?MRN: [id number] ?Visit Date: 10/05/2021 ?             ?Requested by: Dorna Mai, MD ?[phone] ?[place],  [place] [id number] ?PCP: Dorna Mai, MD ? ? ?Assessment & Plan: ?Visit Diagnoses:  ?1. Olecranon bursitis of left elbow   ?2. Chronic left shoulder pain   ? ? ?Plan: Impression as left shoulder subacromial bursitis and left elbow olecranon bursitis.  Today, I injected the left shoulder subacromial space.  Have also provided with a Jobe exercise program.  If her symptoms do not improve over the next several weeks she will call and let us know we will get an MRI to assess her rotator cuff.  In regards to the olecranon bursa, today I aspirated this and injected with cortisone.  Compression wrap applied.  Follow-up with [place] as needed. ? ?Follow-Up Instructions: Return if symptoms worsen or fail to improve.  ? ?Orders:  ?Orders Placed This Encounter  ?Procedures  ? Large Joint Inj  ? Medium Joint Inj  ? ?No orders of the defined types were placed in this encounter. ? ? ? ? Procedures: ?Large Joint Inj: L subacromial bursa on 10/05/2021 4:16 PM ?Indications: pain ?Details: 22 G needle ?Medications: 2 mL lidocaine 2 %; 2 mL bupivacaine 0.25 %; 40 mg methylPREDNISolone acetate 40 MG/ML ?Outcome: tolerated well, no immediate complications ?Patient was prepped and draped in the usual sterile fashion.  ? ? ?Medium Joint Inj: L olecranon bursa on 10/05/2021 4:16 PM ?Details: 22 G needle ?Medications: 1 mL lidocaine 1 %; 13.33 mg methylPREDNISolone acetate 40 MG/ML; 0.66 mL bupivacaine 0.25 % ? ? ? ? ?Clinical Data: ?No additional findings. ? ? ?Subjective: ?Chief Complaint  ?Patient presents with  ? Left Shoulder - Pain  ? ? ?HPI patient is a pleasant 71 year old female who comes in today with left shoulder and left elbow pain.  Both began approximately 5 weeks ago.  She notes that 3 weeks prior, she fell face  forward while walking her dog.  She never had any shoulder or elbow pain at that time, however.  The pain in the shoulder is primarily to the deltoid.  Worse with forward flexion and internal rotation.  She denies any weakness.  In regards to the left elbow, she has had pain and swelling for about the same time.  No recent scrape, bug bite or injury other than the fall 3 weeks prior to the onset of her symptoms.  No fevers or chills. ? ? ?Objective: ?Vital Signs: There were no vitals taken for this visit. ? ?Physical Exam well-developed well-nourished female no acute distress.  Alert and oriented x3. ? ?Ortho Exam left shoulder exam reveals forward flexion to 120 degrees.  Very limited internal rotation.  Positive empty can with 5 out of 5 strength throughout.  She is neurovascular intact distally.  Left elbow exam reveals a golf ball sized olecranon bursa.  Minimally tender.  No skin changes.  Full elbow range of motion.  She is neurovascular intact distally. ?Specialty Comments:  ?No specialty comments available. ? ?Imaging: ?No new imaging ? ? ?PMFS History: ?Patient Active Problem List  ? Diagnosis Date Noted  ? Colitis 03/17/2021  ? Sepsis ([place]) 03/17/2021  ? AKI (acute kidney injury) ([place]) 03/17/2021  ? Subjective memory complaints 02/21/2019  ?  Frequency of urination- pt thinks due to HCTZ BP med 02/21/2019  ? Hyperlipidemia with target LDL less than 70 11/13/2018  ? Closed fracture of left distal radius 10/25/2018  ? Tobacco abuse counseling 07/12/2018  ? Sleep difficulties- poor sleep hygeine 07/12/2018  ? Chronic pain disorder 07/12/2018  ? Chronic joint pain 07/12/2018  ? OSA and COPD overlap syndrome ([place]) 04/30/2018  ? Chronic fatigue 04/30/2018  ? Lung nodule seen on imaging study 04/30/2018  ? Vitamin D deficiency 04/30/2018  ? Tobacco use disorder 04/11/2018  ? Elevated LDL cholesterol level 04/11/2018  ? Chronic obstructive pulmonary disease ([place]) 04/11/2018  ? GERD (gastroesophageal reflux disease)  04/11/2018  ? Asthma 04/11/2018  ? Obesity, Class I, BMI 30-34.9 04/11/2018  ? Glucose intolerance (impaired glucose tolerance) 04/11/2018  ? Irritable bowel syndrome 04/11/2018  ? Chronic bilateral upper abdominal pain 04/11/2018  ? Panic attacks 04/11/2018  ? Mood disorder ([place]) 04/11/2018  ? Hypokalemia 04/11/2018  ? Environmental and seasonal allergies 04/11/2018  ? Elevated coronary artery calcium score 12/01/2014  ? Benign essential HTN 07/11/2013  ? Depression with anxiety 07/11/2013  ? Pure hypercholesterolemia 07/11/2013  ? Overweight 06/15/2013  ? ?Past Medical History:  ?Diagnosis Date  ? Allergy   ? "THINK I GOT THEM"  ? Anxiety   ? Arthritis   ? "BODY FULL OF ARTHRITIS"  ? Asthma   ? Bronchitis   ? Cancer [facility])   ? "RIGHT LEG SQUAMOUS CELL"  ? Closed fracture of left distal radius 10/25/2018  ? COPD (chronic obstructive pulmonary disease) ([place])   ? Depression   ? GERD (gastroesophageal reflux disease)   ? Headache   ? Hypercholesteremia   ? Hypertension   ? IBS (irritable bowel syndrome)   ? Sleep apnea   ? Tobacco abuse   ? Vitamin D deficiency   ?  ?Family History  ?Problem Relation Age of Onset  ? CAD Mother 61  ? Alzheimer's disease Father 18  ? Diabetes Father   ? Colon cancer Neg Hx   ? Esophageal cancer Neg Hx   ? Colon polyps Neg Hx   ? Rectal cancer Neg Hx   ? Stomach cancer Neg Hx   ?  ?Past Surgical History:  ?Procedure Laterality Date  ? APPENDECTOMY    ? COLONOSCOPY  08/26/2015  ? Colonic polyps status post polypectomy. Minimal sigmoid diverticulosis.   ? ESOPHAGOGASTRODUODENOSCOPY  02/25/2011  ? Large hiatal hernia otherwise normal EGD.  ? OPEN REDUCTION INTERNAL FIXATION (ORIF) DISTAL RADIAL FRACTURE Left 10/25/2018  ? Procedure: OPEN REDUCTION INTERNAL FIXATION (ORIF)LEFT  DISTAL RADIAL FRACTURE;  Surgeon: Marchia Bond, MD;  Location: [facility];  Service: Orthopedics;  Laterality: Left;  ? POLYPECTOMY    ? VAGINAL HYSTERECTOMY    ? ?Social History  ? ?Occupational  History  ? Not on file  ?Tobacco Use  ? Smoking status: Former  ?  Packs/day: 1.00  ?  Years: 44.00  ?  Pack years: 44.00  ?  Types: Cigarettes  ?  Quit date: 12/19/2019  ?  Years since quitting: 1.7  ?  Passive exposure: Current  ? Smokeless tobacco: Never  ? Tobacco comments:  ?  in process of quitting  ?Vaping Use  ? Vaping Use: Former  ? Devices: did it for 6 months and stopped   ?Substance and Sexual Activity  ? Alcohol use: Yes  ? Drug use: Yes  ?  Types: Marijuana  ?  Comment: 2 puffs per day-WHEN SICK  ?  Sexual activity: Not Currently  ? ? ? ? ? ? ?

## 2021-10-08 DIAGNOSIS — E785 Hyperlipidemia, unspecified: Secondary | ICD-10-CM

## 2021-10-08 DIAGNOSIS — J45909 Unspecified asthma, uncomplicated: Secondary | ICD-10-CM

## 2021-10-08 DIAGNOSIS — G894 Chronic pain syndrome: Secondary | ICD-10-CM

## 2021-10-08 DIAGNOSIS — J3089 Other allergic rhinitis: Secondary | ICD-10-CM

## 2021-10-08 DIAGNOSIS — G8929 Other chronic pain: Secondary | ICD-10-CM

## 2021-10-08 DIAGNOSIS — F39 Unspecified mood [affective] disorder: Secondary | ICD-10-CM

## 2021-10-08 NOTE — Telephone Encounter (Signed)
Summary: wanting medications for few days  ? Patient called in to inquire of Dr Redmond Pulling about getting her medication for at least a few days until she figure everything out. Say that she is having problems with her husband and she had to leave her home in a hurry and that he took her keys and all her medications are left behind. Patient states that she have not had medication in 3 days please contact patient at Ph# [phone]  Patient Ask that if anyone calls not to give any information to them even the people that are on her DPR   ?  ?Pt requesting at least 30 days or what ever supply she can get because of the circumstance.  ?She is requesting the following medication: ?Albuterol inhaler, Wellbutrin, Celebrex, Vitamin B 12, Trelegy, Flonase, Gabapentin,Lorazepam, Singulair, Multivitamin, Fish oil, Omeprazole, Crestor and Sertraline. Please send to [place] at [facility].  ? ? ? ?Pt wants to change her DPR ASAP. Daughter Lynelle Smoke has [employer] that can received and sign a document. If pt need to come in, please notify her at Trey's number in demographic section. ?Her demographic address and numbers need to get moved to Confidential location. Pt also requested Break the glass. Current and correct information is located in temporary location in demographics. ?

## 2021-10-08 NOTE — Telephone Encounter (Signed)
Called FC at [facility] [place] and updated with the triage call. Informed that pt wants to change DPR ASAP, break the glass feature, and to move temporary address to confidential location. The phone numbers are correct. Informed Doroteo Bradford that pt's daughter has [employer] and she can receive DPR at her e-mail : [email or web address]. Erika verbalized understanding. ?

## 2021-10-11 ENCOUNTER — Telehealth: Payer: Self-pay | Admitting: Family Medicine

## 2021-10-11 NOTE — Telephone Encounter (Signed)
I spoke with flow Coordinator Doroteo Bradford this morning (Monday).   She's going to forward pt's rx request to Dr Dois Davenport nurse.   Dr. Redmond Pulling wasn't in the office Friday to address this.    ?

## 2021-10-11 NOTE — Telephone Encounter (Deleted)
This encounter was created in error - please disregard.

## 2021-10-11 NOTE — Telephone Encounter (Signed)
Summary: wanting medications for few days  ?  Patient called in to inquire of Dr Redmond Pulling about getting her medication for at least a few days until she figure everything out. Say that she is having problems with her husband and she had to leave her home in a hurry and that he took her keys and all her medications are left behind. Patient states that she have not had medication in 3 days please contact patient at Ph# [phone]  Patient Ask that if anyone calls not to give any information to them even the people that are on her DPR   ?  ?Pt requesting at least 30 days or what ever supply she can get because of the circumstance.  ?She is requesting the following medication: ?Albuterol inhaler, Wellbutrin, Celebrex, Vitamin B 12, Trelegy, Flonase, Gabapentin,Lorazepam, Singulair, Multivitamin, Fish oil, Omeprazole, Crestor and Sertraline. Please send to [place] at [facility].  ? ?

## 2021-10-12 ENCOUNTER — Other Ambulatory Visit: Payer: Self-pay | Admitting: *Deleted

## 2021-10-12 NOTE — Progress Notes (Signed)
I spoke with provider and she will reorder patient medication ?

## 2021-10-15 ENCOUNTER — Other Ambulatory Visit: Payer: Self-pay | Admitting: Family Medicine

## 2021-10-15 ENCOUNTER — Encounter: Payer: Self-pay | Admitting: Clinical

## 2021-10-15 DIAGNOSIS — J45909 Unspecified asthma, uncomplicated: Secondary | ICD-10-CM

## 2021-10-15 DIAGNOSIS — J3089 Other allergic rhinitis: Secondary | ICD-10-CM

## 2021-10-15 DIAGNOSIS — G8929 Other chronic pain: Secondary | ICD-10-CM

## 2021-10-15 DIAGNOSIS — E785 Hyperlipidemia, unspecified: Secondary | ICD-10-CM

## 2021-10-15 DIAGNOSIS — G894 Chronic pain syndrome: Secondary | ICD-10-CM

## 2021-10-15 MED ORDER — ALBUTEROL SULFATE HFA 108 (90 BASE) MCG/ACT IN AERS: 2.0000 | INHALATION_SPRAY | Freq: Four times a day (QID) | RESPIRATORY_TRACT | 0 refills | Status: AC | PRN

## 2021-10-15 MED ORDER — ROSUVASTATIN CALCIUM 40 MG PO TABS: ORAL_TABLET | ORAL | 0 refills | Status: DC

## 2021-10-15 MED ORDER — BUPROPION HCL ER (XL) 300 MG PO TB24: 300.0000 mg | ORAL_TABLET | Freq: Every day | ORAL | 0 refills | Status: DC

## 2021-10-15 MED ORDER — MONTELUKAST SODIUM 10 MG PO TABS: 10.0000 mg | ORAL_TABLET | Freq: Every day | ORAL | 0 refills | Status: DC

## 2021-10-15 MED ORDER — OMEPRAZOLE 20 MG PO CPDR: 20.0000 mg | DELAYED_RELEASE_CAPSULE | Freq: Every day | ORAL | 0 refills | Status: DC

## 2021-10-15 MED ORDER — TRELEGY ELLIPTA 100-62.5-25 MCG/ACT IN AEPB: 1.0000 | INHALATION_SPRAY | Freq: Every day | RESPIRATORY_TRACT | 0 refills | Status: DC

## 2021-10-15 MED ORDER — FLUTICASONE PROPIONATE 50 MCG/ACT NA SUSP: 2.0000 | Freq: Every day | NASAL | 0 refills | Status: DC

## 2021-10-15 MED ORDER — LOSARTAN POTASSIUM 100 MG PO TABS: 100.0000 mg | ORAL_TABLET | Freq: Every day | ORAL | 0 refills | Status: DC

## 2021-10-15 MED ORDER — CELECOXIB 200 MG PO CAPS: 200.0000 mg | ORAL_CAPSULE | Freq: Every day | ORAL | 0 refills | Status: DC | PRN

## 2021-10-15 MED ORDER — SERTRALINE HCL 100 MG PO TABS: 200.0000 mg | ORAL_TABLET | Freq: Every day | ORAL | 0 refills | Status: DC

## 2021-10-15 MED ORDER — GABAPENTIN 300 MG PO CAPS: ORAL_CAPSULE | ORAL | 0 refills | Status: DC

## 2021-10-15 NOTE — Telephone Encounter (Signed)
I attempted to call pt, no answer, left detailed message informing pt of my departure from [place] and my intent to refer pt. I have sent pt counseling resources.  ?

## 2021-10-19 ENCOUNTER — Other Ambulatory Visit (HOSPITAL_COMMUNITY): Payer: Self-pay

## 2021-10-20 ENCOUNTER — Other Ambulatory Visit (HOSPITAL_COMMUNITY): Payer: Self-pay

## 2021-10-20 MED ORDER — FLUTICASONE PROPIONATE 50 MCG/ACT NA SUSP
NASAL | 1 refills | Status: DC
  Filled 2021-10-20: qty 16, 30d supply, fill #0

## 2021-10-20 MED ORDER — OMEPRAZOLE 20 MG PO CPDR: DELAYED_RELEASE_CAPSULE | ORAL | 1 refills | Status: DC

## 2021-10-20 MED ORDER — MONTELUKAST SODIUM 10 MG PO TABS
ORAL_TABLET | ORAL | 3 refills | Status: DC
  Filled 2021-10-20: qty 30, 30d supply, fill #0

## 2021-10-20 MED ORDER — HYDROCORTISONE (PERIANAL) 2.5 % EX CREA
TOPICAL_CREAM | CUTANEOUS | 2 refills | Status: DC
  Filled 2021-10-20: qty 30, 10d supply, fill #0

## 2021-10-23 ENCOUNTER — Other Ambulatory Visit (HOSPITAL_COMMUNITY): Payer: Self-pay

## 2021-11-02 ENCOUNTER — Telehealth: Payer: Self-pay | Admitting: Family Medicine

## 2021-11-02 ENCOUNTER — Other Ambulatory Visit: Payer: Self-pay | Admitting: Family

## 2021-11-02 DIAGNOSIS — F39 Unspecified mood [affective] disorder: Secondary | ICD-10-CM

## 2021-11-02 NOTE — Telephone Encounter (Signed)
LORazepam (ATIVAN) 0.5 MG tablet [[id number]]  ? ?gabapentin (NEURONTIN) 300 MG capsule [[id number]]  ? ?Please use the Pharmacy at  ? ?[facility]  ?[address] [place] [address]  ?Phone:  [phone]  Fax:  [phone]   ? ?To DELIVER per Pt REQUEST AND [place] ?

## 2021-11-05 NOTE — Telephone Encounter (Signed)
Patient aware that next refill should be the last of June 2023

## 2021-11-12 NOTE — Telephone Encounter (Signed)
Pt said she never spoke to anyone and is upset her meds are not being refilled FYI

## 2021-11-16 ENCOUNTER — Other Ambulatory Visit (HOSPITAL_COMMUNITY): Payer: Self-pay

## 2021-11-16 ENCOUNTER — Other Ambulatory Visit: Payer: Self-pay | Admitting: Family Medicine

## 2021-11-16 NOTE — Telephone Encounter (Signed)
Medication Refill - Medication:  montelukast (SINGULAIR) 10 MG tablet  buPROPion (WELLBUTRIN XL) 300 MG 24 hr tablet  omeprazole (PRILOSEC) 20 MG capsule losartan (COZAAR) 100 MG tablet  Has the patient contacted their pharmacy? Yes.   Contact PCP  Preferred Pharmacy (with phone number or street name):  [facility] ([address]), [place] [facility] [place]  [address]) [place] [address]  Phone:  [phone]  Fax:  [phone]    Has the patient been seen for an appointment in the last year OR does the patient have an upcoming appointment? Yes.    Agent: Please be advised that RX refills may take up to 3 business days. We ask that you follow-up with your pharmacy.

## 2021-11-17 ENCOUNTER — Other Ambulatory Visit: Payer: Self-pay | Admitting: Family

## 2021-11-17 DIAGNOSIS — F39 Unspecified mood [affective] disorder: Secondary | ICD-10-CM

## 2021-11-17 MED ORDER — OMEPRAZOLE 20 MG PO CPDR
DELAYED_RELEASE_CAPSULE | ORAL | 1 refills | Status: DC
Start: 2021-11-17 — End: 2022-05-03

## 2021-11-17 MED ORDER — LOSARTAN POTASSIUM 100 MG PO TABS: 100.0000 mg | ORAL_TABLET | Freq: Every day | ORAL | 0 refills | Status: DC

## 2021-11-17 MED ORDER — MONTELUKAST SODIUM 10 MG PO TABS: ORAL_TABLET | ORAL | 3 refills | Status: DC

## 2021-11-17 MED ORDER — BUPROPION HCL ER (XL) 300 MG PO TB24: 300.0000 mg | ORAL_TABLET | Freq: Every day | ORAL | 0 refills | Status: DC

## 2021-11-17 NOTE — Telephone Encounter (Signed)
Requested Prescriptions  Pending Prescriptions Disp Refills  . montelukast (SINGULAIR) 10 MG tablet 30 tablet 3    Sig: Take 1 tablet by mouth in the evening     Pulmonology:  Leukotriene Inhibitors Passed - 11/16/2021  2:03 PM      Passed - Valid encounter within last 12 months    Recent Outpatient Visits          1 month ago Diarrhea, unspecified type   Primary Care at [facility], Cari S, PA-C   1 month ago Depression with anxiety   Primary Care at [facility], MD   2 months ago Depression with anxiety   Primary Care at [facility], MD   3 months ago Depression with anxiety   Primary Care at [facility], MD   7 years ago Routine general medical examination at a health care facility   [facility] at [facility], MD      Future Appointments            In 2 months Dorna Mai, MD Primary Care at [facility]           . buPROPion (WELLBUTRIN XL) 300 MG 24 hr tablet 30 tablet 0    Sig: Take 1 tablet (300 mg total) by mouth daily.     Psychiatry: Antidepressants - bupropion Failed - 11/16/2021  2:03 PM      Failed - Cr in normal range and within 360 days    Creat  Date Value Ref Range Status  01/22/2014 1.33 (H) 0.50 - 1.10 mg/dL Final   Creatinine, Ser  Date Value Ref Range Status  09/20/2021 1.05 (H) 0.57 - 1.00 mg/dL Final         Passed - AST in normal range and within 360 days    AST  Date Value Ref Range Status  09/20/2021 18 0 - 40 IU/L Final         Passed - ALT in normal range and within 360 days    ALT  Date Value Ref Range Status  09/20/2021 15 0 - 32 IU/L Final         Passed - Completed PHQ-2 or PHQ-9 in the last 360 days      Passed - Last BP in normal range    BP Readings from Last 1 Encounters:  09/29/21 129/73         Passed - Valid encounter within last 6 months    Recent Outpatient Visits          1 month ago Diarrhea, unspecified type   Primary Care  at [facility], Cari S, PA-C   1 month ago Depression with anxiety   Primary Care at [facility], MD   2 months ago Depression with anxiety   Primary Care at [facility], MD   3 months ago Depression with anxiety   Primary Care at [facility], MD   7 years ago Routine general medical examination at a health care facility   [facility] at [facility], Lavone Neri, MD      Future Appointments            In 2 months Dorna Mai, MD Primary Care at [facility]           . omeprazole (PRILOSEC) 20 MG capsule 90 capsule 1    Sig: Take 1 capsule by mouth once a day  Gastroenterology: Proton Pump Inhibitors Passed - 11/16/2021  2:03 PM      Passed - Valid encounter within last 12 months    Recent Outpatient Visits          1 month ago Diarrhea, unspecified type   Primary Care at [facility], Cari S, PA-C   1 month ago Depression with anxiety   Primary Care at [facility], MD   2 months ago Depression with anxiety   Primary Care at [facility], MD   3 months ago Depression with anxiety   Primary Care at [facility], MD   7 years ago Routine general medical examination at a health care facility   Primary Care at [facility], MD      Future Appointments            In 2 months Dorna Mai, MD Primary Care at [facility]           . losartan (COZAAR) 100 MG tablet 30 tablet 0    Sig: Take 1 tablet (100 mg total) by mouth daily.     Cardiovascular:  Angiotensin Receptor Blockers Failed - 11/16/2021  2:03 PM      Failed - Cr in normal range and within 180 days    Creat  Date Value Ref Range Status  01/22/2014 1.33 (H) 0.50 - 1.10 mg/dL Final   Creatinine, Ser  Date Value Ref Range Status  09/20/2021 1.05 (H) 0.57 - 1.00 mg/dL Final         Passed - K in normal range and within 180 days    Potassium  Date Value  Ref Range Status  09/20/2021 4.5 3.5 - 5.2 mmol/L Final         Passed - Patient is not pregnant      Passed - Last BP in normal range    BP Readings from Last 1 Encounters:  09/29/21 129/73         Passed - Valid encounter within last 6 months    Recent Outpatient Visits          1 month ago Diarrhea, unspecified type   Primary Care at [facility], Cari S, PA-C   1 month ago Depression with anxiety   Primary Care at [facility], MD   2 months ago Depression with anxiety   Primary Care at [facility], MD   3 months ago Depression with anxiety   Primary Care at [facility], MD   7 years ago Routine general medical examination at a health care facility   [facility] at [facility], Lavone Neri, MD      Future Appointments            In 2 months Dorna Mai, MD Primary Care at [facility]

## 2021-11-19 ENCOUNTER — Telehealth: Payer: Self-pay | Admitting: Gastroenterology

## 2021-11-19 NOTE — Telephone Encounter (Signed)
Left message on machine to call back  

## 2021-11-19 NOTE — Telephone Encounter (Signed)
Inbound call from patient stating she is having issues with hemorrhoids  and is in pain when she is using the bathroom. Patient stated she has tried many different ways to help her use the bathroom to loosen her stool but it seems not to work. Patient is seeking advice if there is any other recommendations. Please advise.

## 2021-11-19 NOTE — Telephone Encounter (Signed)
I have attempted without success to contact this patient by phone to I left a message on answering machine Patient can call back if any question regarding message that was left.

## 2021-11-25 NOTE — Telephone Encounter (Signed)
Pt stated that she has not had a BM since the End of last week in which she had to manually disimpact her self. Pt was encouraged to start drinking prune juice, do a Miralax Purge Instructions provided, drink Miralax once a day,and start using an OTC stool softer, drink lots of water and walk often. Pt was notified to call our office tomorrow afternoon and update [place].  Pt verbalized understanding with all questions answered.

## 2021-12-22 ENCOUNTER — Ambulatory Visit (INDEPENDENT_AMBULATORY_CARE_PROVIDER_SITE_OTHER): Payer: Medicare Other | Admitting: Family Medicine

## 2021-12-22 ENCOUNTER — Telehealth: Payer: Self-pay | Admitting: Family Medicine

## 2021-12-22 ENCOUNTER — Encounter: Payer: Self-pay | Admitting: Family Medicine

## 2021-12-22 VITALS — BP 140/80 | HR 82 | Temp 95.1°F | Ht 62.0 in | Wt 181.2 lb

## 2021-12-22 DIAGNOSIS — F439 Reaction to severe stress, unspecified: Secondary | ICD-10-CM | POA: Diagnosis not present

## 2021-12-22 DIAGNOSIS — K5901 Slow transit constipation: Secondary | ICD-10-CM

## 2021-12-22 DIAGNOSIS — F418 Other specified anxiety disorders: Secondary | ICD-10-CM

## 2021-12-22 DIAGNOSIS — F17200 Nicotine dependence, unspecified, uncomplicated: Secondary | ICD-10-CM

## 2021-12-22 MED ORDER — POLYETHYLENE GLYCOL 3350 17 GM/SCOOP PO POWD: ORAL | 1 refills | Status: AC

## 2021-12-22 MED ORDER — QUETIAPINE FUMARATE 25 MG PO TABS: 25.0000 mg | ORAL_TABLET | Freq: Every day | ORAL | 2 refills | Status: DC

## 2021-12-22 MED ORDER — QUETIAPINE FUMARATE 25 MG PO TABS: 25.0000 mg | ORAL_TABLET | Freq: Every day | ORAL | 1 refills | Status: DC

## 2021-12-22 NOTE — Telephone Encounter (Signed)
Please advise 

## 2021-12-22 NOTE — Telephone Encounter (Signed)
Copied from [place] [id number]. Topic: General - Other >> Dec 22, 2021 11:45 AM Marcellus Scott wrote: Reason for CRM: Pt wanted to let PCP know she has changed Primary Care.
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
## 2022-01-10 ENCOUNTER — Telehealth: Payer: Self-pay | Admitting: Pulmonary Disease

## 2022-01-10 ENCOUNTER — Other Ambulatory Visit: Payer: Self-pay | Admitting: Family Medicine

## 2022-01-10 MED ORDER — TRELEGY ELLIPTA 100-62.5-25 MCG/ACT IN AEPB: 1.0000 | INHALATION_SPRAY | Freq: Every day | RESPIRATORY_TRACT | 6 refills | Status: DC

## 2022-01-10 NOTE — Telephone Encounter (Signed)
Called patient and verified medication she wanted refilled and pharmacy. Nothing further needed

## 2022-01-16 IMAGING — DX DG CHEST 2V
2 series · 2 of 2 positions shown · non-contrast
Comparison: X-ray chest 03/17/2021; CT chest 07/19/2018

CLINICAL DATA: Cough.

EXAM:
CHEST - 2 VIEW

[chest pa]
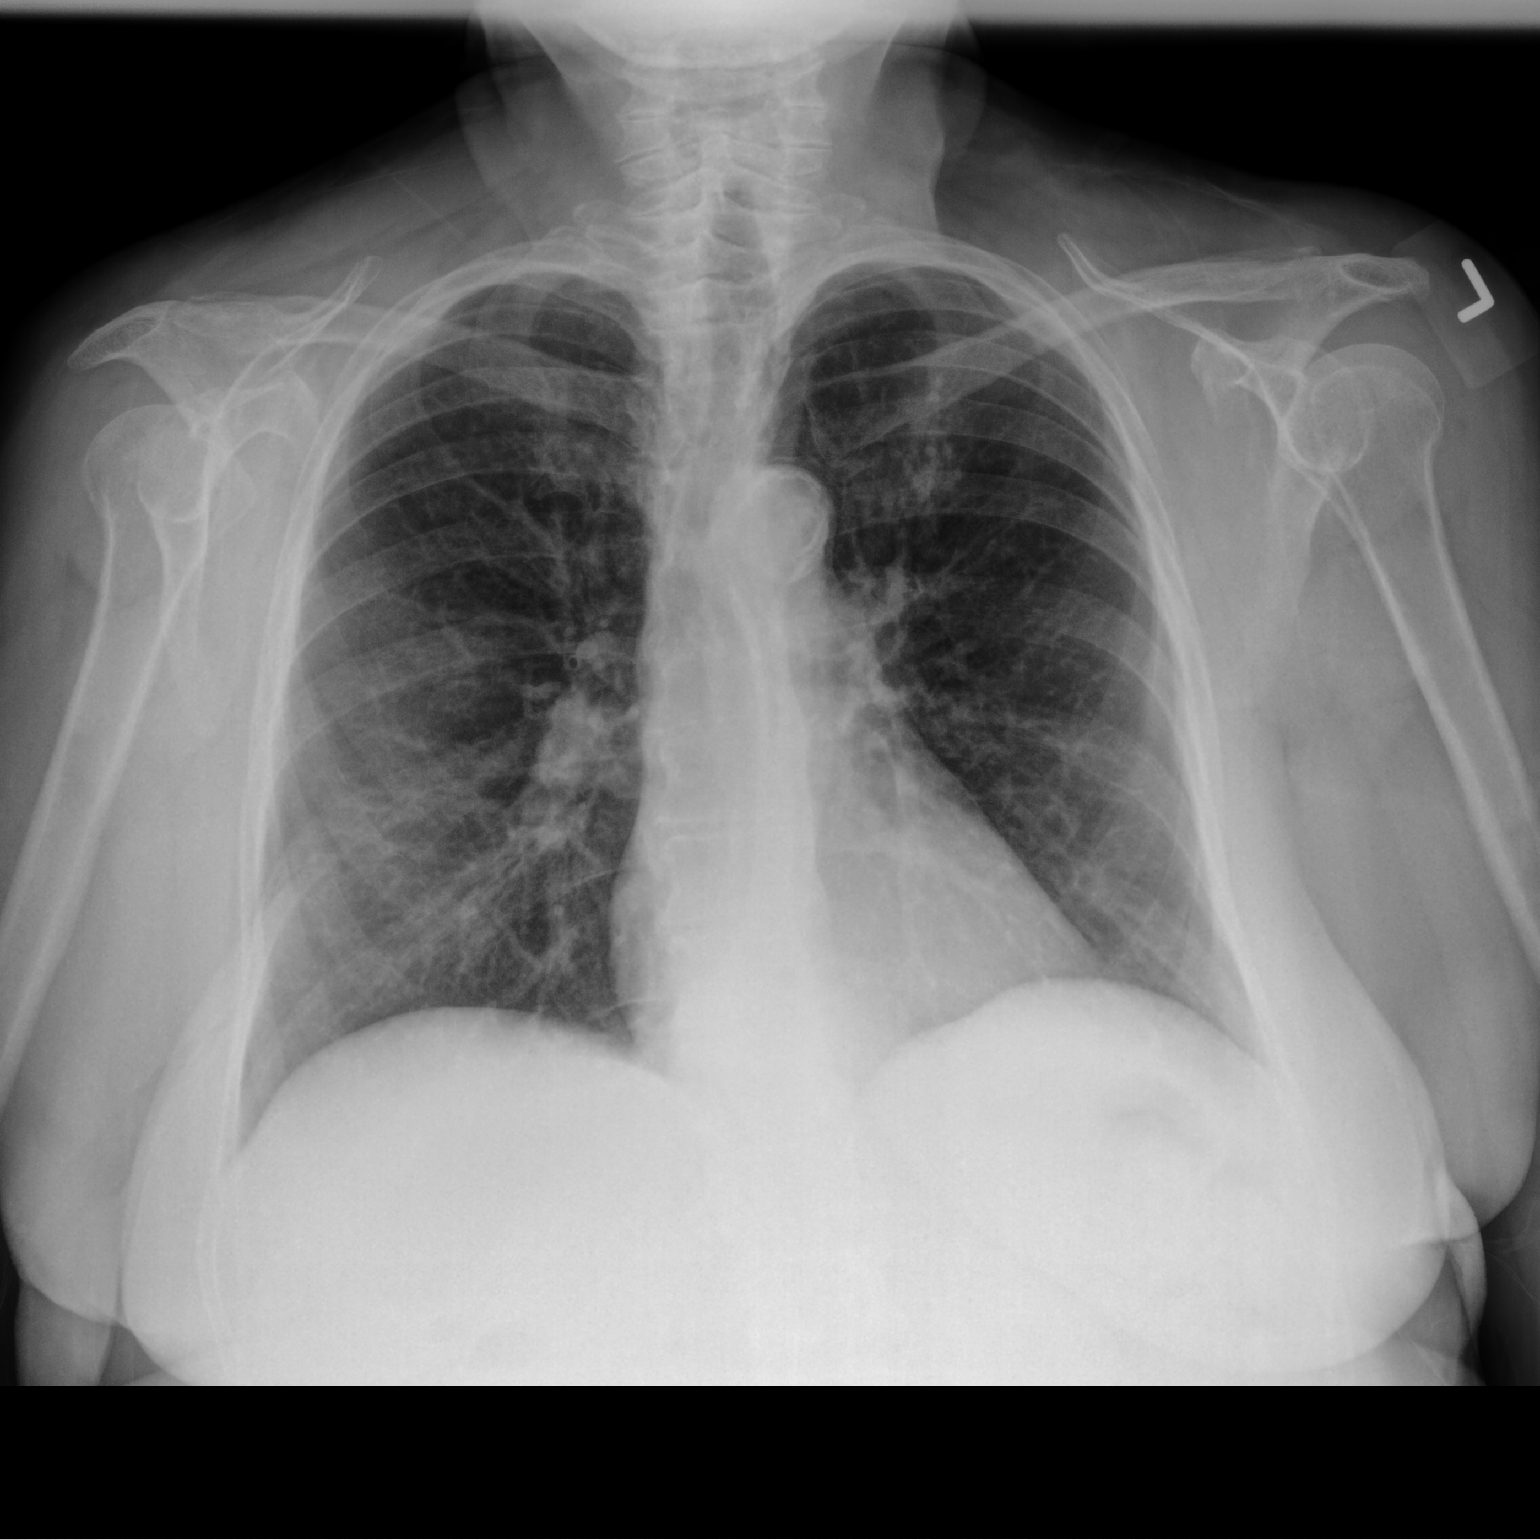

[chest lat]
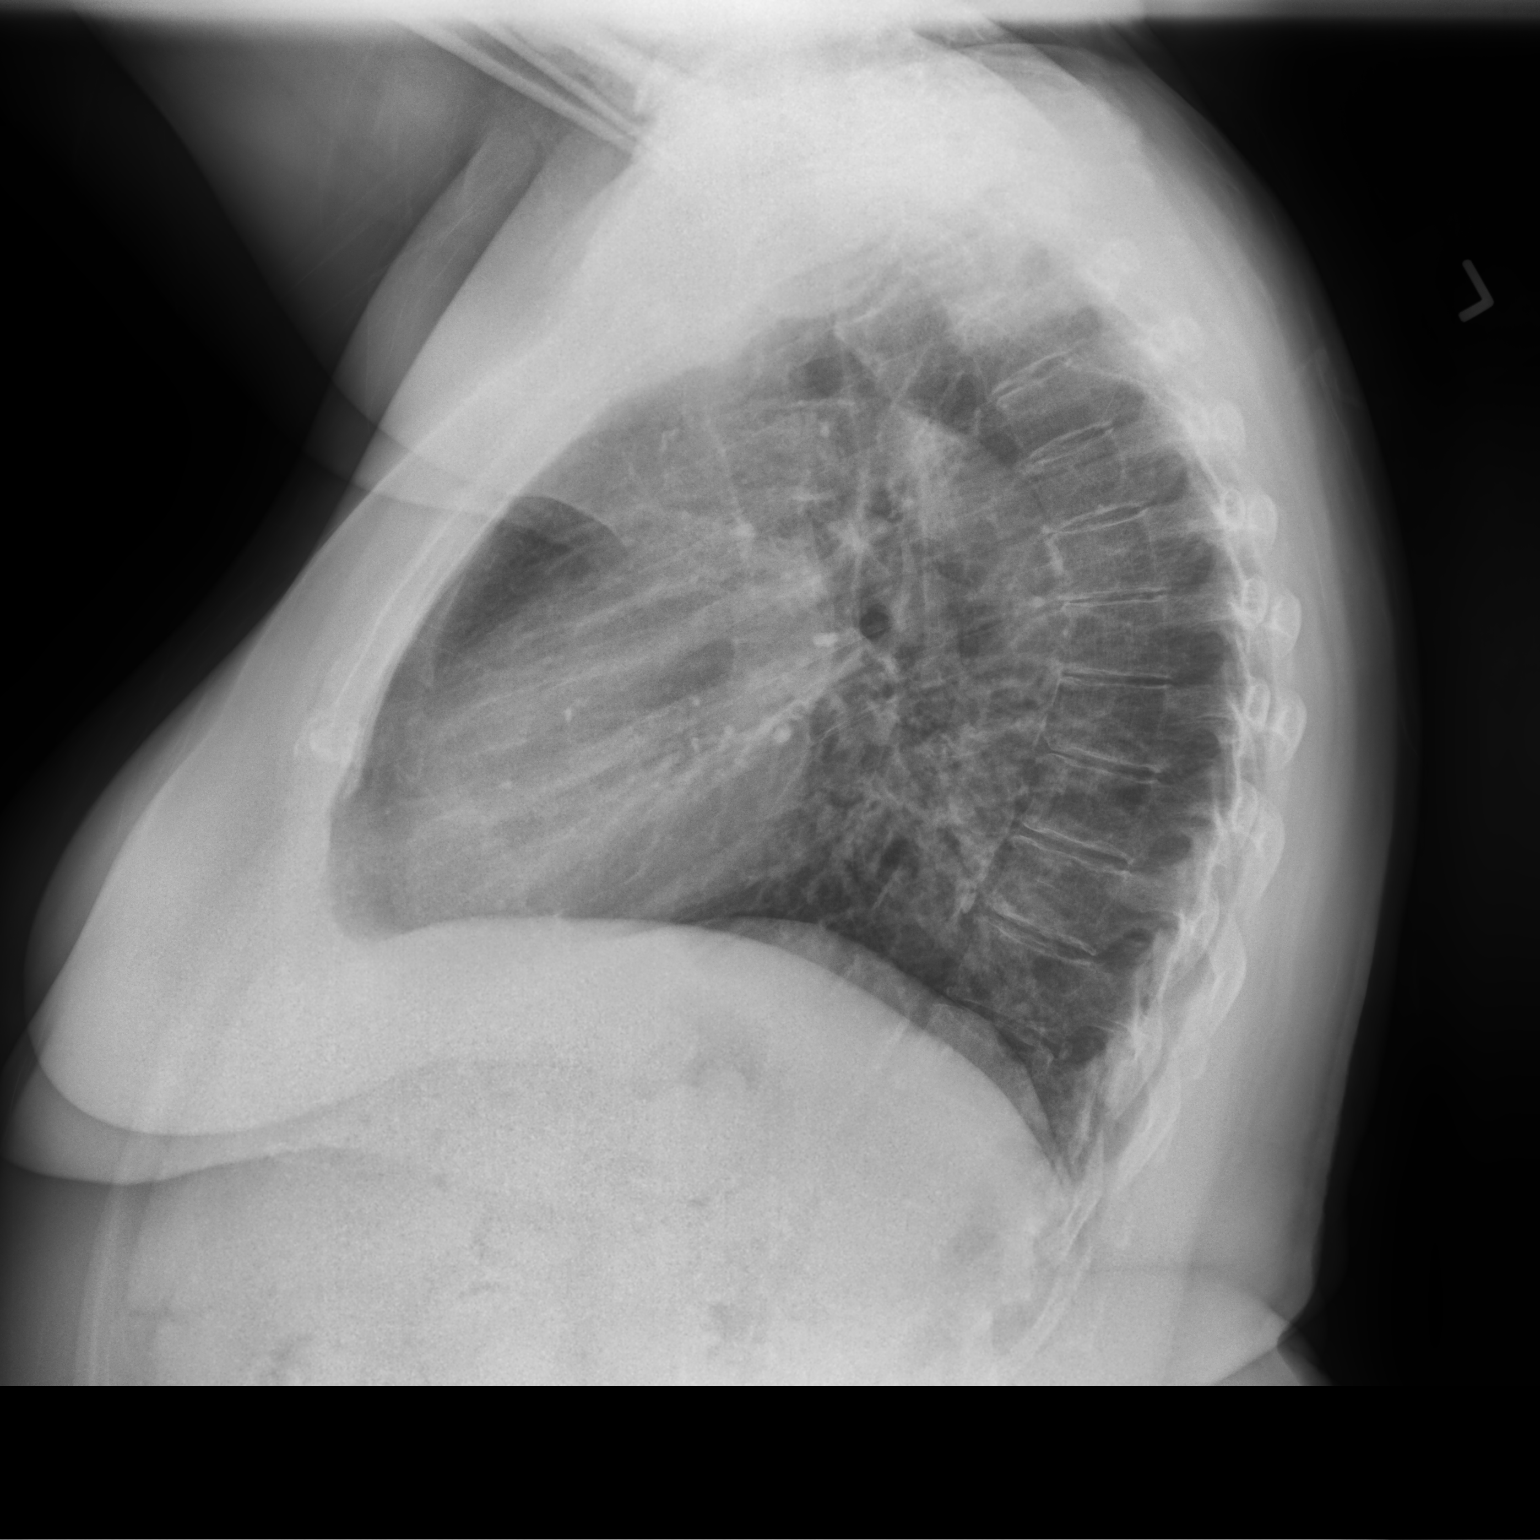

[2 of 2 positions shown; findings below may reference images not displayed]

FINDINGS: The heart size and mediastinal contours are within normal limits.
Normal pulmonary vascularity. New small opacity in the left upper
lobe. Scattered calcified granulomas again noted. No pleural
effusion or pneumothorax. No acute osseous abnormality.
IMPRESSION: 1. New small opacity in the left upper lobe, concerning for
pneumonia.

## 2022-01-18 ENCOUNTER — Telehealth: Payer: Self-pay | Admitting: Family Medicine

## 2022-01-18 NOTE — Telephone Encounter (Signed)
Left message for patient to call back and schedule Medicare Annual Wellness Visit (AWV).   Please offer to do virtually or by telephone.  Left office number and my jabber #[phone].  Last AWV:07/19/2019   Please schedule at anytime with Nurse Health Advisor.  

## 2022-01-22 ENCOUNTER — Other Ambulatory Visit: Payer: Self-pay | Admitting: Family Medicine

## 2022-01-22 DIAGNOSIS — I10 Essential (primary) hypertension: Secondary | ICD-10-CM

## 2022-01-22 NOTE — Telephone Encounter (Signed)
Courtesy refill. Schedule appointment.

## 2022-01-24 ENCOUNTER — Ambulatory Visit: Payer: Medicare Other | Admitting: Family Medicine

## 2022-02-03 ENCOUNTER — Other Ambulatory Visit: Payer: Self-pay | Admitting: Family Medicine

## 2022-02-03 DIAGNOSIS — F418 Other specified anxiety disorders: Secondary | ICD-10-CM

## 2022-02-07 MED ORDER — SERTRALINE HCL 100 MG PO TABS: 200.0000 mg | ORAL_TABLET | Freq: Every day | ORAL | 1 refills | Status: DC

## 2022-02-07 MED ORDER — BUPROPION HCL ER (XL) 300 MG PO TB24: 300.0000 mg | ORAL_TABLET | Freq: Every day | ORAL | 1 refills | Status: DC

## 2022-02-07 NOTE — Addendum Note (Signed)
Addended by: Jon Billings on: 02/07/2022 07:51 AM   Modules accepted: Orders

## 2022-02-26 ENCOUNTER — Other Ambulatory Visit: Payer: Self-pay | Admitting: Family Medicine

## 2022-02-26 DIAGNOSIS — G8929 Other chronic pain: Secondary | ICD-10-CM

## 2022-02-26 DIAGNOSIS — G894 Chronic pain syndrome: Secondary | ICD-10-CM

## 2022-03-06 ENCOUNTER — Other Ambulatory Visit: Payer: Self-pay | Admitting: Family Medicine

## 2022-03-06 DIAGNOSIS — G894 Chronic pain syndrome: Secondary | ICD-10-CM

## 2022-03-06 DIAGNOSIS — G8929 Other chronic pain: Secondary | ICD-10-CM

## 2022-03-15 ENCOUNTER — Telehealth: Payer: Self-pay

## 2022-03-15 NOTE — Telephone Encounter (Signed)
Attempted to call patient x4 , the number rings busy , tried all available phone numbers.Patient reschedule for the nex=t available appointment.  L.Wilson,LPN

## 2022-03-24 ENCOUNTER — Encounter: Payer: Self-pay | Admitting: Family Medicine

## 2022-03-24 ENCOUNTER — Telehealth: Payer: Self-pay | Admitting: Family Medicine

## 2022-03-24 ENCOUNTER — Ambulatory Visit (INDEPENDENT_AMBULATORY_CARE_PROVIDER_SITE_OTHER): Payer: Medicare Other | Admitting: Family Medicine

## 2022-03-24 ENCOUNTER — Ambulatory Visit: Payer: Medicare Other | Admitting: Family Medicine

## 2022-03-24 VITALS — BP 148/72 | HR 74 | Temp 96.4°F | Ht 62.0 in | Wt 185.0 lb

## 2022-03-24 DIAGNOSIS — F418 Other specified anxiety disorders: Secondary | ICD-10-CM | POA: Diagnosis not present

## 2022-03-24 DIAGNOSIS — I10 Essential (primary) hypertension: Secondary | ICD-10-CM

## 2022-03-24 DIAGNOSIS — G894 Chronic pain syndrome: Secondary | ICD-10-CM

## 2022-03-24 DIAGNOSIS — L989 Disorder of the skin and subcutaneous tissue, unspecified: Secondary | ICD-10-CM

## 2022-03-24 DIAGNOSIS — F439 Reaction to severe stress, unspecified: Secondary | ICD-10-CM

## 2022-03-24 DIAGNOSIS — Z23 Encounter for immunization: Secondary | ICD-10-CM | POA: Diagnosis not present

## 2022-03-24 DIAGNOSIS — G8929 Other chronic pain: Secondary | ICD-10-CM

## 2022-03-24 DIAGNOSIS — M2550 Pain in unspecified joint: Secondary | ICD-10-CM | POA: Diagnosis not present

## 2022-03-24 MED ORDER — GABAPENTIN 300 MG PO CAPS: ORAL_CAPSULE | ORAL | 2 refills | Status: DC

## 2022-03-24 MED ORDER — QUETIAPINE FUMARATE 25 MG PO TABS: 25.0000 mg | ORAL_TABLET | Freq: Every day | ORAL | 2 refills | Status: DC

## 2022-03-24 NOTE — Telephone Encounter (Signed)
Pt rescheduled app to 3:20. I did not send letter.

## 2022-03-24 NOTE — Telephone Encounter (Signed)
Pt was a no show for Dr. Ethelene Hal on 03/24/22 for an OV, I sent a letter.
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
## 2022-03-26 LAB — HM DEXA SCAN

## 2022-03-28 LAB — HEMOGLOBIN A1C: Hemoglobin A1C: 5.4

## 2022-03-29 NOTE — Telephone Encounter (Signed)
pt missed 10:00a appt, called at 10:45a and scheduled for later afternoon same day, 1st instance, fee waived, no letter sent.

## 2022-03-31 NOTE — Telephone Encounter (Signed)
I attempted to call patient, but she left her phone at home. I spoke to patient's husband and let him know I would call patient back to schedule AWV.

## 2022-04-01 ENCOUNTER — Telehealth: Payer: Self-pay | Admitting: Family Medicine

## 2022-04-01 DIAGNOSIS — E785 Hyperlipidemia, unspecified: Secondary | ICD-10-CM

## 2022-04-01 DIAGNOSIS — J3089 Other allergic rhinitis: Secondary | ICD-10-CM

## 2022-04-01 DIAGNOSIS — J45909 Unspecified asthma, uncomplicated: Secondary | ICD-10-CM

## 2022-04-01 NOTE — Telephone Encounter (Signed)
Caller Name: pt Call back phone #: [phone]   MEDICATION(S):  rosuvastatin (CRESTOR) 40 MG tablet [[id number]]  and montelukast (SINGULAIR) 10 MG tablet [[id number]]  Days of Med Remaining: she's out Has the patient contacted their pharmacy (YES/NO)? Yes contact your PCP   Preferred Pharmacy:  [place] [place] - [address]  [address] [facility]) [place] [address]  Phone:  [phone]  Fax:  [phone]

## 2022-04-01 NOTE — Telephone Encounter (Signed)
I spoke to patient and she scheduled AWV on 04/19/2022.

## 2022-04-04 MED ORDER — ROSUVASTATIN CALCIUM 40 MG PO TABS: ORAL_TABLET | ORAL | 0 refills | Status: DC

## 2022-04-04 MED ORDER — MONTELUKAST SODIUM 10 MG PO TABS: 10.0000 mg | ORAL_TABLET | Freq: Every day | ORAL | 0 refills | Status: AC

## 2022-04-07 DIAGNOSIS — E119 Type 2 diabetes mellitus without complications: Secondary | ICD-10-CM | POA: Diagnosis not present

## 2022-04-07 LAB — HM DIABETES EYE EXAM

## 2022-04-09 ENCOUNTER — Other Ambulatory Visit: Payer: Self-pay | Admitting: Family Medicine

## 2022-04-09 DIAGNOSIS — G8929 Other chronic pain: Secondary | ICD-10-CM

## 2022-04-09 DIAGNOSIS — G894 Chronic pain syndrome: Secondary | ICD-10-CM

## 2022-04-14 ENCOUNTER — Encounter: Payer: Self-pay | Admitting: Family Medicine

## 2022-04-18 ENCOUNTER — Other Ambulatory Visit: Payer: Self-pay | Admitting: Family

## 2022-04-18 DIAGNOSIS — I10 Essential (primary) hypertension: Secondary | ICD-10-CM

## 2022-04-19 ENCOUNTER — Telehealth: Payer: Self-pay

## 2022-04-19 NOTE — Telephone Encounter (Signed)
Spoke with patient she has scheduling conflict today and is busy and cannot completed appointment , request a call to reschedule next week.  Thanks, Jadene Pierini

## 2022-04-19 NOTE — Telephone Encounter (Signed)
Pt now goes to Dr Ethelene Hal with [facility] [place]   Requested Prescriptions  Refused Prescriptions Disp Refills  . losartan (COZAAR) 100 MG tablet [Pharmacy Med Name: Losartan Potassium 100 MG Oral Tablet] 30 tablet 0    Sig: Take 1 tablet by mouth once daily     Cardiovascular:  Angiotensin Receptor Blockers Failed - 04/18/2022  9:54 AM      Failed - Cr in normal range and within 180 days    Creat  Date Value Ref Range Status  01/22/2014 1.33 (H) 0.50 - 1.10 mg/dL Final   Creatinine, Ser  Date Value Ref Range Status  09/20/2021 1.05 (H) 0.57 - 1.00 mg/dL Final         Failed - K in normal range and within 180 days    Potassium  Date Value Ref Range Status  09/20/2021 4.5 3.5 - 5.2 mmol/L Final         Failed - Last BP in normal range    BP Readings from Last 1 Encounters:  03/24/22 (!) 148/72         Failed - Valid encounter within last 6 months    Recent Outpatient Visits          6 months ago Diarrhea, unspecified type   Primary Care at [facility], Cari S, PA-C   7 months ago Depression with anxiety   Primary Care at [facility], MD   7 months ago Depression with anxiety   Primary Care at [facility], MD   8 months ago Depression with anxiety   Primary Care at [facility], MD   8 years ago Routine general medical examination at a health care facility   [facility] at [facility], MD      Future Appointments            In 2 months Ethelene Hal Mortimer Fries, MD [facility] [place] - Patient is not pregnant

## 2022-04-20 ENCOUNTER — Other Ambulatory Visit: Payer: Self-pay

## 2022-04-20 ENCOUNTER — Telehealth: Payer: Self-pay | Admitting: Family

## 2022-04-20 ENCOUNTER — Other Ambulatory Visit: Payer: Self-pay | Admitting: Family Medicine

## 2022-04-20 DIAGNOSIS — I10 Essential (primary) hypertension: Secondary | ICD-10-CM

## 2022-04-20 DIAGNOSIS — F418 Other specified anxiety disorders: Secondary | ICD-10-CM

## 2022-04-20 MED ORDER — LOSARTAN POTASSIUM 100 MG PO TABS: 100.0000 mg | ORAL_TABLET | Freq: Every day | ORAL | 0 refills | Status: DC

## 2022-04-20 NOTE — Telephone Encounter (Signed)
Pharmacy sent losartan request to previous PCP. Can Dr. Ethelene Hal send in refills?

## 2022-04-22 ENCOUNTER — Encounter: Payer: Self-pay | Admitting: Family Medicine

## 2022-04-25 NOTE — Telephone Encounter (Signed)
I spoke to patient and she rescheduled appointment to 05/10/2022. Patient said she has been very stressed and prefers a phone call.

## 2022-04-26 ENCOUNTER — Ambulatory Visit: Payer: Medicare Other | Admitting: Family Medicine

## 2022-04-26 ENCOUNTER — Telehealth: Payer: Self-pay | Admitting: Family Medicine

## 2022-04-26 ENCOUNTER — Encounter: Payer: Self-pay | Admitting: Family Medicine

## 2022-04-26 NOTE — Telephone Encounter (Signed)
Pt was a no show for an OV with Dr. Ethelene Hal on 04/26/22, I sent a letter.

## 2022-04-26 NOTE — Telephone Encounter (Signed)
2nd no show/late cancel, fee generated, final warning sent via mychart and mail

## 2022-05-02 ENCOUNTER — Other Ambulatory Visit (HOSPITAL_COMMUNITY): Payer: Self-pay

## 2022-05-03 ENCOUNTER — Ambulatory Visit (INDEPENDENT_AMBULATORY_CARE_PROVIDER_SITE_OTHER): Payer: Medicare Other | Admitting: Family Medicine

## 2022-05-03 ENCOUNTER — Encounter: Payer: Self-pay | Admitting: Family Medicine

## 2022-05-03 VITALS — BP 136/78 | HR 64 | Temp 98.3°F | Ht 62.0 in | Wt 179.8 lb

## 2022-05-03 DIAGNOSIS — F439 Reaction to severe stress, unspecified: Secondary | ICD-10-CM

## 2022-05-03 DIAGNOSIS — F418 Other specified anxiety disorders: Secondary | ICD-10-CM

## 2022-05-03 DIAGNOSIS — M2550 Pain in unspecified joint: Secondary | ICD-10-CM

## 2022-05-03 DIAGNOSIS — G8929 Other chronic pain: Secondary | ICD-10-CM | POA: Diagnosis not present

## 2022-05-03 DIAGNOSIS — K219 Gastro-esophageal reflux disease without esophagitis: Secondary | ICD-10-CM

## 2022-05-03 DIAGNOSIS — J309 Allergic rhinitis, unspecified: Secondary | ICD-10-CM

## 2022-05-03 MED ORDER — CELECOXIB 200 MG PO CAPS: 200.0000 mg | ORAL_CAPSULE | Freq: Every day | ORAL | 2 refills | Status: DC | PRN

## 2022-05-03 MED ORDER — OMEPRAZOLE 40 MG PO CPDR: 40.0000 mg | DELAYED_RELEASE_CAPSULE | Freq: Every day | ORAL | 1 refills | Status: DC

## 2022-05-03 MED ORDER — FLUTICASONE PROPIONATE 50 MCG/ACT NA SUSP: NASAL | 1 refills | Status: DC

## 2022-05-03 NOTE — Progress Notes (Addendum)
Established Patient Office Visit  Subjective   Patient ID: Tiffany Murphy, female    DOB: 05/08/1951  Age: 71 y.o. MRN: [id number]  Chief Complaint  Patient presents with   Follow-up    Follow up on medication, discuss increasing Omeprazole patient would like to go back to '40mg'$ .     HPI for follow-up of reflux.  She had taken 40 mg of Prilosec and it was suddenly decreased to 20 mg.  She did well for a few months and then has been experiencing breakthrough reflux.  She denies nausea or diaphoresis.  There has been nonexertional chest pain associated with it.  She continues with rosuvastatin 20 mg.  Recent LDL cholesterol was 58.  She quit smoking 3 years ago.    Review of Systems  Constitutional: Negative.  Negative for diaphoresis.  HENT: Negative.    Eyes:  Negative for blurred vision, discharge and redness.  Respiratory: Negative.  Negative for shortness of breath.   Cardiovascular:  Positive for chest pain. Negative for palpitations.  Gastrointestinal:  Positive for heartburn. Negative for abdominal pain, nausea and vomiting.  Genitourinary: Negative.   Musculoskeletal: Negative.  Negative for myalgias.  Skin:  Negative for rash.  Neurological:  Negative for tingling, loss of consciousness and weakness.  Endo/Heme/Allergies:  Negative for polydipsia.      05/10/2022    1:43 PM 03/24/2022    4:29 PM 03/24/2022    3:29 PM  Depression screen PHQ 2/9  Decreased Interest 0 3 0  Down, Depressed, Hopeless '3 2 1  '$ PHQ - 2 Score '3 5 1  '$ Altered sleeping 0 2   Tired, decreased energy 3 2   Change in appetite 0 2   Feeling bad or failure about yourself  1 1   Trouble concentrating 0 0   Moving slowly or fidgety/restless 0 1   Suicidal thoughts 0 0   PHQ-9 Score 7 13   Difficult doing work/chores Very difficult Very difficult        Objective:     BP 136/78 (BP Location: Left Arm, Patient Position: Sitting, Cuff Size: Large)   Pulse 64   Temp 98.3 F (36.8 C) (Temporal)    Ht '5\' 2"'$  (1.575 m)   Wt 179 lb 12.8 oz (81.6 kg)   SpO2 96%   BMI 32.89 kg/m    Physical Exam Constitutional:      General: She is not in acute distress.    Appearance: Normal appearance. She is not ill-appearing, toxic-appearing or diaphoretic.  HENT:     Head: Normocephalic and atraumatic.     Right Ear: External ear normal.     Left Ear: External ear normal.  Eyes:     General: No scleral icterus.       Right eye: No discharge.        Left eye: No discharge.     Extraocular Movements: Extraocular movements intact.     Conjunctiva/sclera: Conjunctivae normal.  Cardiovascular:     Rate and Rhythm: Normal rate and regular rhythm.  Pulmonary:     Effort: Pulmonary effort is normal. No respiratory distress.     Breath sounds: Normal breath sounds.  Abdominal:     General: Bowel sounds are normal.  Musculoskeletal:     Cervical back: No rigidity or tenderness.  Skin:    General: Skin is warm and dry.  Neurological:     Mental Status: She is alert and oriented to person, place, and time.  Psychiatric:  Mood and Affect: Mood normal.        Behavior: Behavior normal.      No results found for any visits on 05/03/22.    The 10-year ASCVD risk score (Arnett DK, et al., 2019) is: 16.2%    Assessment & Plan:   Problem List Items Addressed This Visit       Digestive   GERD (gastroesophageal reflux disease) (Chronic)   Relevant Medications   omeprazole (PRILOSEC) 40 MG capsule     Other   Depression with anxiety - Primary (Chronic)   Chronic joint pain   Relevant Medications   celecoxib (CELEBREX) 200 MG capsule   Stress at home   Other Visit Diagnoses     Allergic rhinitis, unspecified seasonality, unspecified trigger       Relevant Medications   fluticasone (FLONASE) 50 MCG/ACT nasal spray       Return in about 3 months (around 08/03/2022), or Return fasting in 3 months for recheck of blood work..  Follow-up in 3 months or sooner if chest pain  does not resolve with higher dose of Prilosec.  Continue Celebrex as needed.  Continue sertraline 100 mg.  Continue 25 mg of quetiapine at at bedtime.  May consider increase next visit.  Continue Wellbutrin 300 mg daily.  Libby Maw, MD

## 2022-05-10 ENCOUNTER — Ambulatory Visit (INDEPENDENT_AMBULATORY_CARE_PROVIDER_SITE_OTHER): Payer: Medicare Other

## 2022-05-10 VITALS — BP 112/64 | HR 77 | Temp 97.8°F | Ht 62.0 in | Wt 186.8 lb

## 2022-05-10 DIAGNOSIS — Z0000 Encounter for general adult medical examination without abnormal findings: Secondary | ICD-10-CM | POA: Diagnosis not present

## 2022-05-10 NOTE — Patient Instructions (Signed)
Tiffany Murphy , Thank you for taking time to come for your Medicare Wellness Visit. I appreciate your ongoing commitment to your health goals. Please review the following plan we discussed and let me know if I can assist you in the future.   Screening recommendations/referrals: Colonoscopy: not required Mammogram: completed 09/14/2021 Bone Density: completed 2022 Recommended yearly ophthalmology/optometry visit for glaucoma screening and checkup Recommended yearly dental visit for hygiene and checkup  Vaccinations: Influenza vaccine: completed 03/24/2022 Pneumococcal vaccine: completed 06/24/2021 Tdap vaccine: completed 10/21/2018 Shingles vaccine: needs second dose   Covid-19:06/24/2021, 02/07/2021, 05/28/2020, 10/08/2019, 09/14/2019  Advanced directives: Advance directive discussed with you today. Even though you declined this today please call our office should you change your mind and we can give you the proper paperwork for you to fill out.   Conditions/risks identified: none  Next appointment: Follow up in one year for your annual wellness visit    Preventive Care 65 Years and Older, Female Preventive care refers to lifestyle choices and visits with your health care provider that can promote health and wellness. What does preventive care include? A yearly physical exam. This is also called an annual well check. Dental exams once or twice a year. Routine eye exams. Ask your health care provider how often you should have your eyes checked. Personal lifestyle choices, including: Daily care of your teeth and gums. Regular physical activity. Eating a healthy diet. Avoiding tobacco and drug use. Limiting alcohol use. Practicing safe sex. Taking low-dose aspirin every day. Taking vitamin and mineral supplements as recommended by your health care provider. What happens during an annual well check? The services and screenings done by your health care provider during your annual well check will  depend on your age, overall health, lifestyle risk factors, and family history of disease. Counseling  Your health care provider may ask you questions about your: Alcohol use. Tobacco use. Drug use. Emotional well-being. Home and relationship well-being. Sexual activity. Eating habits. History of falls. Memory and ability to understand (cognition). Work and work Statistician. Reproductive health. Screening  You may have the following tests or measurements: Height, weight, and BMI. Blood pressure. Lipid and cholesterol levels. These may be checked every 5 years, or more frequently if you are over 74 years old. Skin check. Lung cancer screening. You may have this screening every year starting at age 10 if you have a 30-pack-year history of smoking and currently smoke or have quit within the past 15 years. Fecal occult blood test (FOBT) of the stool. You may have this test every year starting at age 45. Flexible sigmoidoscopy or colonoscopy. You may have a sigmoidoscopy every 5 years or a colonoscopy every 10 years starting at age 64. Hepatitis C blood test. Hepatitis B blood test. Sexually transmitted disease (STD) testing. Diabetes screening. This is done by checking your blood sugar (glucose) after you have not eaten for a while (fasting). You may have this done every 1-3 years. Bone density scan. This is done to screen for osteoporosis. You may have this done starting at age 82. Mammogram. This may be done every 1-2 years. Talk to your health care provider about how often you should have regular mammograms. Talk with your health care provider about your test results, treatment options, and if necessary, the need for more tests. Vaccines  Your health care provider may recommend certain vaccines, such as: Influenza vaccine. This is recommended every year. Tetanus, diphtheria, and acellular pertussis (Tdap, Td) vaccine. You may need a Td booster every  10 years. Zoster vaccine. You may  need this after age 49. Pneumococcal 13-valent conjugate (PCV13) vaccine. One dose is recommended after age 68. Pneumococcal polysaccharide (PPSV23) vaccine. One dose is recommended after age 51. Talk to your health care provider about which screenings and vaccines you need and how often you need them. This information is not intended to replace advice given to you by your health care provider. Make sure you discuss any questions you have with your health care provider. Document Released: 07/03/2015 Document Revised: 02/24/2016 Document Reviewed: 04/07/2015 Elsevier Interactive Patient Education  2017 [employer] Prevention in the Home Falls can cause injuries. They can happen to people of all ages. There are many things you can do to make your home safe and to help prevent falls. What can I do on the outside of my home? Regularly fix the edges of walkways and driveways and fix any cracks. Remove anything that might make you trip as you walk through a door, such as a raised step or threshold. Trim any bushes or trees on the path to your home. Use bright outdoor lighting. Clear any walking paths of anything that might make someone trip, such as rocks or tools. Regularly check to see if handrails are loose or broken. Make sure that both sides of any steps have handrails. Any raised decks and porches should have guardrails on the edges. Have any leaves, snow, or ice cleared regularly. Use sand or salt on walking paths during winter. Clean up any spills in your garage right away. This includes oil or grease spills. What can I do in the bathroom? Use night lights. Install grab bars by the toilet and in the tub and shower. Do not use towel bars as grab bars. Use non-skid mats or decals in the tub or shower. If you need to sit down in the shower, use a plastic, non-slip stool. Keep the floor dry. Clean up any water that spills on the floor as soon as it happens. Remove soap buildup in  the tub or shower regularly. Attach bath mats securely with double-sided non-slip rug tape. Do not have throw rugs and other things on the floor that can make you trip. What can I do in the bedroom? Use night lights. Make sure that you have a light by your bed that is easy to reach. Do not use any sheets or blankets that are too big for your bed. They should not hang down onto the floor. Have a firm chair that has side arms. You can use this for support while you get dressed. Do not have throw rugs and other things on the floor that can make you trip. What can I do in the kitchen? Clean up any spills right away. Avoid walking on wet floors. Keep items that you use a lot in easy-to-reach places. If you need to reach something above you, use a strong step stool that has a grab bar. Keep electrical cords out of the way. Do not use floor polish or wax that makes floors slippery. If you must use wax, use non-skid floor wax. Do not have throw rugs and other things on the floor that can make you trip. What can I do with my stairs? Do not leave any items on the stairs. Make sure that there are handrails on both sides of the stairs and use them. Fix handrails that are broken or loose. Make sure that handrails are as long as the stairways. Check any carpeting to make  sure that it is firmly attached to the stairs. Fix any carpet that is loose or worn. Avoid having throw rugs at the top or bottom of the stairs. If you do have throw rugs, attach them to the floor with carpet tape. Make sure that you have a light switch at the top of the stairs and the bottom of the stairs. If you do not have them, ask someone to add them for you. What else can I do to help prevent falls? Wear shoes that: Do not have high heels. Have rubber bottoms. Are comfortable and fit you well. Are closed at the toe. Do not wear sandals. If you use a stepladder: Make sure that it is fully opened. Do not climb a closed  stepladder. Make sure that both sides of the stepladder are locked into place. Ask someone to hold it for you, if possible. Clearly mark and make sure that you can see: Any grab bars or handrails. First and last steps. Where the edge of each step is. Use tools that help you move around (mobility aids) if they are needed. These include: Canes. Walkers. Scooters. Crutches. Turn on the lights when you go into a dark area. Replace any light bulbs as soon as they burn out. Set up your furniture so you have a clear path. Avoid moving your furniture around. If any of your floors are uneven, fix them. If there are any pets around you, be aware of where they are. Review your medicines with your doctor. Some medicines can make you feel dizzy. This can increase your chance of falling. Ask your doctor what other things that you can do to help prevent falls. This information is not intended to replace advice given to you by your health care provider. Make sure you discuss any questions you have with your health care provider. Document Released: 04/02/2009 Document Revised: 11/12/2015 Document Reviewed: 07/11/2014 Elsevier Interactive Patient Education  2017 [employer].

## 2022-05-10 NOTE — Progress Notes (Signed)
Subjective:   Tiffany Murphy is a 71 y.o. female who presents for Medicare Annual (Subsequent) preventive examination.  Review of Systems     Cardiac Risk Factors include: advanced age (>44mn, >>65women);dyslipidemia;hypertension;obesity (BMI >30kg/m2)     Objective:    Today's Vitals   05/10/22 1329  BP: 112/64  Pulse: 77  Temp: 97.8 F (36.6 C)  TempSrc: Oral  SpO2: 94%  Weight: 186 lb 12.8 oz (84.7 kg)  Height: '5\' 2"'$  (1.575 m)   Body mass index is 34.17 kg/m.     05/10/2022    1:41 PM 03/17/2021    5:25 PM 10/23/2018   10:41 AM  Advanced Directives  Does Patient Have a Medical Advance Directive? No No Yes  Type of Advance Directive   Living will;Healthcare Power of Attorney  Would patient like information on creating a medical advance directive? No - Patient declined No - Guardian declined     Current Medications (verified) Outpatient Encounter Medications as of 05/10/2022  Medication Sig   acyclovir (ZOVIRAX) 200 MG capsule Take 200 mg by mouth every morning.   albuterol (PROVENTIL) (2.5 MG/3ML) 0.083% nebulizer solution Take 3 mLs (2.5 mg total) by nebulization every 6 (six) hours as needed for wheezing or shortness of breath.   albuterol (VENTOLIN HFA) 108 (90 Base) MCG/ACT inhaler Inhale 2 puffs into the lungs every 6 (six) hours as needed for wheezing.   buPROPion (WELLBUTRIN XL) 300 MG 24 hr tablet Take 1 tablet by mouth once daily   celecoxib (CELEBREX) 200 MG capsule Take 1 capsule (200 mg total) by mouth daily as needed.   Cyanocobalamin (VITAMIN B 12 PO) Take 1 tablet by mouth daily.   fluticasone (FLONASE) 50 MCG/ACT nasal spray Place 2 sprays into each nostril once a day   Fluticasone-Umeclidin-Vilant (TRELEGY ELLIPTA) 100-62.5-25 MCG/ACT AEPB Inhale 1 puff into the lungs daily. INHALE 1 PUFF ONCE DAILY   gabapentin (NEURONTIN) 300 MG capsule TAKE 1 CAPSULE BY MOUTH IN THE MORNING AND 1 CAPSULE IN THE AFTERNOON AND 2 CAPSULES AT BEDTIME   hydrocortisone  (ANUSOL-HC) 2.5 % rectal cream Insert and apply rectally 2 times a day for 10 days   losartan (COZAAR) 100 MG tablet Take 1 tablet (100 mg total) by mouth daily.   montelukast (SINGULAIR) 10 MG tablet Take 1 tablet (10 mg total) by mouth at bedtime.   Multiple Vitamin (MULTIVITAMIN WITH MINERALS) TABS tablet Take 1 tablet by mouth daily.   Omega-3 Fatty Acids (FISH OIL) 1000 MG CPDR Take 1,000 mg by mouth daily.   omeprazole (PRILOSEC) 40 MG capsule Take 1 capsule (40 mg total) by mouth daily.   polyethylene glycol powder (GLYCOLAX/MIRALAX) 17 GM/SCOOP powder Dissolve 1 scoop of medication in water daily as needed for constipation.   QUEtiapine (SEROQUEL) 25 MG tablet Take 1 tablet (25 mg total) by mouth at bedtime.   rosuvastatin (CRESTOR) 40 MG tablet TAKE 1/2 (ONE-HALF) TABLET BY MOUTH AT BEDTIME Strength: 40 mg   sertraline (ZOLOFT) 100 MG tablet Take 2 tablets by mouth once daily   No facility-administered encounter medications on file as of 05/10/2022.    Allergies (verified) Penicillins, Lisinopril, Lovastatin, and Other   History: Past Medical History:  Diagnosis Date   Allergy    "THINK I GOT THEM"   Anxiety    Arthritis    "BODY FULL OF ARTHRITIS"   Asthma    Bronchitis    [facility]    "RIGHT LEG SQUAMOUS CELL"   Closed fracture of  left distal radius 10/25/2018   COPD (chronic obstructive pulmonary disease) (HCC)    Depression    GERD (gastroesophageal reflux disease)    Headache    Hypercholesteremia    Hypertension    IBS (irritable bowel syndrome)    Sleep apnea    Tobacco abuse    Vitamin D deficiency    Past Surgical History:  Procedure Laterality Date   APPENDECTOMY     COLONOSCOPY  08/26/2015   Colonic polyps status post polypectomy. Minimal sigmoid diverticulosis.    ESOPHAGOGASTRODUODENOSCOPY  02/25/2011   Large hiatal hernia otherwise normal EGD.   OPEN REDUCTION INTERNAL FIXATION (ORIF) DISTAL RADIAL FRACTURE Left 10/25/2018   Procedure: OPEN  REDUCTION INTERNAL FIXATION (ORIF)LEFT  DISTAL RADIAL FRACTURE;  Surgeon: Marchia Bond, MD;  Location: [place];  Service: Orthopedics;  Laterality: Left;   POLYPECTOMY     VAGINAL HYSTERECTOMY     Family History  Problem Relation Age of Onset   CAD Mother 4   Alzheimer's disease Father 63   Diabetes Father    Colon cancer Neg Hx    Esophageal cancer Neg Hx    Colon polyps Neg Hx    Rectal cancer Neg Hx    Stomach cancer Neg Hx    Social History   Socioeconomic History   Marital status: Married    Spouse name: Not on file   Number of children: 2   Years of education: Not on file   Highest education level: Not on file  Occupational History   Not on file  Tobacco Use   Smoking status: Former    Packs/day: 1.00    Years: 44.00    Total pack years: 44.00    Types: Cigarettes    Quit date: 12/19/2019    Years since quitting: 2.3    Passive exposure: Current   Smokeless tobacco: Never   Tobacco comments:    in process of quitting  Vaping Use   Vaping Use: Former   Devices: did it for 6 months and stopped   Substance and Sexual Activity   Alcohol use: Yes   Drug use: Yes    Types: Marijuana    Comment: 2 puffs per day-WHEN SICK   Sexual activity: Not Currently  Other Topics Concern   Not on file  Social History Narrative   Works at [employer].    Social Determinants of Health   Financial Resource Strain: Low Risk  (05/10/2022)   Overall Financial Resource Strain (CARDIA)    Difficulty of Paying Living Expenses: Not hard at all  Food Insecurity: No Food Insecurity (05/10/2022)   Hunger Vital Sign    Worried About Running Out of Food in the Last Year: Never true    Ran Out of Food in the Last Year: Never true  Transportation Needs: No Transportation Needs (05/10/2022)   PRAPARE - Hydrologist (Medical): No    Lack of Transportation (Non-Medical): No  Physical Activity: Inactive (05/10/2022)   Exercise  Vital Sign    Days of Exercise per Week: 0 days    Minutes of Exercise per Session: 0 min  Stress: Stress Concern Present (05/10/2022)   [place]    Feeling of Stress : To some extent  Social Connections: Not on file    Tobacco Counseling Counseling given: Not Answered Tobacco comments: in process of quitting   Clinical Intake:  Pre-visit preparation completed: Yes  Pain :  No/denies pain     Nutritional Status: BMI > 30  Obese Nutritional Risks: None Diabetes: No  How often do you need to have someone help you when you read instructions, pamphlets, or other written materials from your doctor or pharmacy?: 1 - Never What is the last grade level you completed in school?: GED  Diabetic? no  Interpreter Needed?: No  Information entered by :: NAllen LPN   Activities of Daily Living    05/10/2022    1:45 PM  In your present state of health, do you have any difficulty performing the following activities:  Hearing? 1  Vision? 0  Difficulty concentrating or making decisions? 1  Walking or climbing stairs? 0  Dressing or bathing? 0  Doing errands, shopping? 0  Preparing Food and eating ? N  Using the Toilet? N  In the past six months, have you accidently leaked urine? Y  Do you have problems with loss of bowel control? Y  Managing your Medications? N  Managing your Finances? N  Housekeeping or managing your Housekeeping? N    Patient Care Team: Libby Maw, MD as PCP - General (Family Medicine) Minus Breeding, MD as Consulting Physician (Cardiology) Jackquline Denmark, MD as Consulting Physician (Gastroenterology) Adron Bene, Woody Seller, MD as Referring Physician (Family Medicine) Laurin Coder, MD as Consulting Physician (Pulmonary Disease) Marchia Bond, MD as Consulting Physician (Orthopedic Surgery) Barnie Mort, NP as Referring Physician (Nurse  Practitioner)  Indicate any recent Medical Services you may have received from other than [facility] providers in the past year (date may be approximate).     Assessment:   This is a routine wellness examination for Mellina.  Hearing/Vision screen Vision Screening - Comments:: Regular eye exams, [facility]  Dietary issues and exercise activities discussed: Current Exercise Habits: The patient does not participate in regular exercise at present   Goals Addressed             This Visit's Progress    Patient Stated       05/10/2022, wants to start exercises       Depression Screen    05/10/2022    1:43 PM 03/24/2022    4:29 PM 03/24/2022    3:29 PM 03/24/2022    3:28 PM 12/22/2021    9:53 AM 09/29/2021    9:27 AM 09/20/2021    2:11 PM  PHQ 2/9 Scores  PHQ - 2 Score '3 5 1 '$ 0 '5 6 6  '$ PHQ- 9 Score '7 13   16 19 18    '$ Fall Risk    05/10/2022    1:42 PM 03/24/2022    3:29 PM 03/24/2022    3:28 PM 12/22/2021    9:28 AM 07/26/2021    1:17 PM  Fall Risk   Falls in the past year? 1 1 0 1 1  Comment loses balance, trips      Number falls in past yr: 1 1 0 1 1  Injury with Fall? 0   1 1  Risk for fall due to : Medication side effect      Follow up Falls evaluation completed;Education provided;Falls prevention discussed        FALL RISK PREVENTION PERTAINING TO THE HOME:  Any stairs in or around the home? No  If so, are there any without handrails?  N/a Home free of loose throw rugs in walkways, pet beds, electrical cords, etc? Yes  Adequate lighting in your home to reduce risk of  falls? Yes   ASSISTIVE DEVICES UTILIZED TO PREVENT FALLS:  Life alert? No  Use of a cane, walker or w/c? No  Grab bars in the bathroom? Yes  Shower chair or bench in shower? No  Elevated toilet seat or a handicapped toilet? Yes   TIMED UP AND GO:  Was the test performed? Yes .  Length of time to ambulate 10 feet: 5 sec.   Gait steady and fast without use of assistive device  Cognitive Function:         05/10/2022    1:48 PM 07/19/2019    8:17 AM  6CIT Screen  What Year? 0 points 0 points  What month? 0 points 0 points  What time? 0 points 0 points  Count back from 20 0 points 0 points  Months in reverse 0 points 0 points  Repeat phrase 4 points 0 points  Total Score 4 points 0 points    Immunizations Immunization History  Administered Date(s) Administered   Fluad Quad(high Dose 65+) 05/01/2020, 02/07/2021   Influenza, High Dose Seasonal PF 07/12/2018, 03/24/2022   Influenza,inj,Quad PF,6+ Mos 07/11/2013   PFIZER(Purple Top)SARS-COV-2 Vaccination 09/14/2019, 10/08/2019, 05/28/2020, 06/24/2021   Pfizer Covid-19 Vaccine Bivalent Booster 28yr & up 02/07/2021   Pneumococcal Polysaccharide-23 05/01/2020, 06/24/2021   Tdap 06/20/2008, 10/21/2018   Zoster Recombinat (Shingrix) 06/24/2021    TDAP status: Up to date  Flu Vaccine status: Up to date  Pneumococcal vaccine status: Up to date  Covid-19 vaccine status: Completed vaccines  Qualifies for Shingles Vaccine? Yes   Zostavax completed Yes   Shingrix Completed?: Yes  Screening Tests Health Maintenance  Topic Date Due   Lung Cancer Screening  07/20/2019   Medicare Annual Wellness (AWV)  07/18/2020   Zoster Vaccines- Shingrix (2 of 2) 05/20/2022 (Originally 08/19/2021)   Pneumonia Vaccine 73 Years old (2 - PCV) 07/26/2022 (Originally 06/24/2022)   MAMMOGRAM  09/15/2022   COLONOSCOPY (Pts 45-463yrInsurance coverage will need to be confirmed)  09/01/2031   INFLUENZA VACCINE  Completed   DEXA SCAN  Completed   Hepatitis C Screening  Completed   HPV VACCINES  Aged Out   COVID-19 Vaccine  Discontinued    Health Maintenance  Health Maintenance Due  Topic Date Due   Lung Cancer Screening  07/20/2019   Medicare Annual Wellness (AWV)  07/18/2020    Colorectal cancer screening: No longer required.   Mammogram status: Completed 09/14/2021. Repeat every year  Bone Density status: Completed 03/26/2022.   Lung  Cancer Screening: (Low Dose CT Chest recommended if Age 73-80ears, 30 pack-year currently smoking OR have quit w/in 15years.) does not qualify.   Lung Cancer Screening Referral: no  Additional Screening:  Hepatitis C Screening: does qualify; Completed 07/19/2019  Vision Screening: Recommended annual ophthalmology exams for early detection of glaucoma and other disorders of the eye. Is the patient up to date with their annual eye exam?  Yes  Who is the provider or what is the name of the office in which the patient attends annual eye exams? [facility] provider, would they like to be referred to a provider to establish care? No .   Dental Screening: Recommended annual dental exams for proper oral hygiene  Community Resource Referral / Chronic Care Management: CRR required this visit?  No   CCM required this visit?  No      Plan:     I have personally reviewed and noted the following in the  patient's chart:   Medical and social history Use of alcohol, tobacco or illicit drugs  Current medications and supplements including opioid prescriptions. Patient is not currently taking opioid prescriptions. Functional ability and status Nutritional status Physical activity Advanced directives List of other physicians Hospitalizations, surgeries, and ER visits in previous 12 months Vitals Screenings to include cognitive, depression, and falls Referrals and appointments  In addition, I have reviewed and discussed with patient certain preventive protocols, quality metrics, and best practice recommendations. A written personalized care plan for preventive services as well as general preventive health recommendations were provided to patient.     Kellie Simmering, LPN   [id number]   Nurse Notes: none

## 2022-06-01 ENCOUNTER — Other Ambulatory Visit: Payer: Self-pay | Admitting: Family Medicine

## 2022-06-01 DIAGNOSIS — F418 Other specified anxiety disorders: Secondary | ICD-10-CM

## 2022-06-01 DIAGNOSIS — I10 Essential (primary) hypertension: Secondary | ICD-10-CM

## 2022-06-16 ENCOUNTER — Inpatient Hospital Stay (HOSPITAL_BASED_OUTPATIENT_CLINIC_OR_DEPARTMENT_OTHER)
Admission: EM | Admit: 2022-06-16 | Discharge: 2022-06-18 | DRG: 465 | Disposition: A | Discharge status: Home / Self Care | Payer: Medicare Other | Attending: Internal Medicine | Admitting: Internal Medicine

## 2022-06-16 ENCOUNTER — Emergency Department (HOSPITAL_COMMUNITY)
Admission: EM | Admit: 2022-06-16 | Discharge: 2022-06-16 | Discharge status: Against Medical Advice | Payer: Medicare Other | Source: Home / Self Care

## 2022-06-16 ENCOUNTER — Other Ambulatory Visit: Payer: Self-pay

## 2022-06-16 ENCOUNTER — Emergency Department (HOSPITAL_COMMUNITY): Payer: Medicare Other

## 2022-06-16 ENCOUNTER — Encounter (HOSPITAL_BASED_OUTPATIENT_CLINIC_OR_DEPARTMENT_OTHER): Payer: Self-pay | Admitting: Emergency Medicine

## 2022-06-16 DIAGNOSIS — S52601A Unspecified fracture of lower end of right ulna, initial encounter for closed fracture: Secondary | ICD-10-CM | POA: Diagnosis not present

## 2022-06-16 DIAGNOSIS — Z8719 Personal history of other diseases of the digestive system: Secondary | ICD-10-CM

## 2022-06-16 DIAGNOSIS — I6782 Cerebral ischemia: Secondary | ICD-10-CM | POA: Diagnosis not present

## 2022-06-16 DIAGNOSIS — Z79899 Other long term (current) drug therapy: Secondary | ICD-10-CM | POA: Diagnosis not present

## 2022-06-16 DIAGNOSIS — F419 Anxiety disorder, unspecified: Secondary | ICD-10-CM | POA: Diagnosis present

## 2022-06-16 DIAGNOSIS — K589 Irritable bowel syndrome without diarrhea: Secondary | ICD-10-CM | POA: Diagnosis present

## 2022-06-16 DIAGNOSIS — S52501B Unspecified fracture of the lower end of right radius, initial encounter for open fracture type I or II: Principal | ICD-10-CM | POA: Diagnosis present

## 2022-06-16 DIAGNOSIS — W540XXA Bitten by dog, initial encounter: Secondary | ICD-10-CM | POA: Diagnosis present

## 2022-06-16 DIAGNOSIS — Z85828 Personal history of other malignant neoplasm of skin: Secondary | ICD-10-CM

## 2022-06-16 DIAGNOSIS — Z5321 Procedure and treatment not carried out due to patient leaving prior to being seen by health care provider: Secondary | ICD-10-CM | POA: Insufficient documentation

## 2022-06-16 DIAGNOSIS — R296 Repeated falls: Secondary | ICD-10-CM | POA: Diagnosis not present

## 2022-06-16 DIAGNOSIS — I10 Essential (primary) hypertension: Secondary | ICD-10-CM | POA: Diagnosis not present

## 2022-06-16 DIAGNOSIS — Z23 Encounter for immunization: Secondary | ICD-10-CM | POA: Diagnosis not present

## 2022-06-16 DIAGNOSIS — M79601 Pain in right arm: Secondary | ICD-10-CM | POA: Insufficient documentation

## 2022-06-16 DIAGNOSIS — S52611A Displaced fracture of right ulna styloid process, initial encounter for closed fracture: Secondary | ICD-10-CM | POA: Diagnosis not present

## 2022-06-16 DIAGNOSIS — G4733 Obstructive sleep apnea (adult) (pediatric): Secondary | ICD-10-CM | POA: Diagnosis not present

## 2022-06-16 DIAGNOSIS — S0012XA Contusion of left eyelid and periocular area, initial encounter: Secondary | ICD-10-CM | POA: Insufficient documentation

## 2022-06-16 DIAGNOSIS — Z87891 Personal history of nicotine dependence: Secondary | ICD-10-CM | POA: Diagnosis not present

## 2022-06-16 DIAGNOSIS — J4489 Other specified chronic obstructive pulmonary disease: Secondary | ICD-10-CM | POA: Diagnosis not present

## 2022-06-16 DIAGNOSIS — S0011XA Contusion of right eyelid and periocular area, initial encounter: Secondary | ICD-10-CM | POA: Insufficient documentation

## 2022-06-16 DIAGNOSIS — Z888 Allergy status to other drugs, medicaments and biological substances status: Secondary | ICD-10-CM | POA: Diagnosis not present

## 2022-06-16 DIAGNOSIS — S0083XA Contusion of other part of head, initial encounter: Secondary | ICD-10-CM | POA: Insufficient documentation

## 2022-06-16 DIAGNOSIS — S61511A Laceration without foreign body of right wrist, initial encounter: Secondary | ICD-10-CM | POA: Diagnosis not present

## 2022-06-16 DIAGNOSIS — Z90710 Acquired absence of both cervix and uterus: Secondary | ICD-10-CM

## 2022-06-16 DIAGNOSIS — S52301B Unspecified fracture of shaft of right radius, initial encounter for open fracture type I or II: Secondary | ICD-10-CM | POA: Diagnosis not present

## 2022-06-16 DIAGNOSIS — Z8249 Family history of ischemic heart disease and other diseases of the circulatory system: Secondary | ICD-10-CM

## 2022-06-16 DIAGNOSIS — K219 Gastro-esophageal reflux disease without esophagitis: Secondary | ICD-10-CM | POA: Diagnosis present

## 2022-06-16 DIAGNOSIS — W06XXXA Fall from bed, initial encounter: Secondary | ICD-10-CM | POA: Insufficient documentation

## 2022-06-16 DIAGNOSIS — Z880 Allergy status to penicillin: Secondary | ICD-10-CM

## 2022-06-16 DIAGNOSIS — S52351A Displaced comminuted fracture of shaft of radius, right arm, initial encounter for closed fracture: Secondary | ICD-10-CM | POA: Diagnosis not present

## 2022-06-16 DIAGNOSIS — I639 Cerebral infarction, unspecified: Secondary | ICD-10-CM | POA: Diagnosis not present

## 2022-06-16 DIAGNOSIS — S52202B Unspecified fracture of shaft of left ulna, initial encounter for open fracture type I or II: Secondary | ICD-10-CM | POA: Diagnosis present

## 2022-06-16 DIAGNOSIS — F32A Depression, unspecified: Secondary | ICD-10-CM | POA: Diagnosis not present

## 2022-06-16 DIAGNOSIS — F418 Other specified anxiety disorders: Secondary | ICD-10-CM | POA: Diagnosis not present

## 2022-06-16 DIAGNOSIS — S51851A Open bite of right forearm, initial encounter: Secondary | ICD-10-CM | POA: Diagnosis not present

## 2022-06-16 DIAGNOSIS — E782 Mixed hyperlipidemia: Secondary | ICD-10-CM | POA: Diagnosis not present

## 2022-06-16 DIAGNOSIS — S52501A Unspecified fracture of the lower end of right radius, initial encounter for closed fracture: Secondary | ICD-10-CM | POA: Diagnosis not present

## 2022-06-16 DIAGNOSIS — G319 Degenerative disease of nervous system, unspecified: Secondary | ICD-10-CM | POA: Diagnosis not present

## 2022-06-16 DIAGNOSIS — S52302B Unspecified fracture of shaft of left radius, initial encounter for open fracture type I or II: Secondary | ICD-10-CM | POA: Diagnosis present

## 2022-06-16 DIAGNOSIS — S51811A Laceration without foreign body of right forearm, initial encounter: Secondary | ICD-10-CM | POA: Diagnosis not present

## 2022-06-16 DIAGNOSIS — S42309B Unspecified fracture of shaft of humerus, unspecified arm, initial encounter for open fracture: Secondary | ICD-10-CM

## 2022-06-16 DIAGNOSIS — S52201B Unspecified fracture of shaft of right ulna, initial encounter for open fracture type I or II: Secondary | ICD-10-CM | POA: Diagnosis present

## 2022-06-16 DIAGNOSIS — S61551A Open bite of right wrist, initial encounter: Secondary | ICD-10-CM | POA: Diagnosis not present

## 2022-06-16 DIAGNOSIS — S52601B Unspecified fracture of lower end of right ulna, initial encounter for open fracture type I or II: Secondary | ICD-10-CM | POA: Diagnosis present

## 2022-06-16 LAB — CBC
HCT: 42.6 % (ref 36.0–46.0)
Hemoglobin: 13.6 g/dL (ref 12.0–15.0)
MCH: 31.3 pg (ref 26.0–34.0)
MCHC: 31.9 g/dL (ref 30.0–36.0)
MCV: 98.2 fL (ref 80.0–100.0)
Platelets: 201 10*3/uL (ref 150–400)
RBC: 4.34 MIL/uL (ref 3.87–5.11)
RDW: 14.4 % (ref 11.5–15.5)
WBC: 7 10*3/uL (ref 4.0–10.5)
nRBC: 0 % (ref 0.0–0.2)

## 2022-06-16 LAB — BASIC METABOLIC PANEL
Anion gap: 7 (ref 5–15)
BUN: 14 mg/dL (ref 8–23)
CO2: 25 mmol/L (ref 22–32)
Calcium: 8.7 mg/dL — ABNORMAL LOW (ref 8.9–10.3)
Chloride: 108 mmol/L (ref 98–111)
Creatinine, Ser: 0.96 mg/dL (ref 0.44–1.00)
GFR, Estimated: >60 mL/min (ref >60)
Glucose, Bld: 191 mg/dL — ABNORMAL HIGH (ref 70–99)
Potassium: 3.9 mmol/L (ref 3.5–5.1)
Sodium: 140 mmol/L (ref 135–145)

## 2022-06-16 MED ORDER — MORPHINE SULFATE (PF) 4 MG/ML IV SOLN
4.0000 mg | Freq: Once | INTRAVENOUS | Status: AC
  Administered 2022-06-16: 4 mg via INTRAVENOUS
  Filled 2022-06-16: qty 1

## 2022-06-16 MED ORDER — OXYCODONE-ACETAMINOPHEN 5-325 MG PO TABS
1.0000 | ORAL_TABLET | Freq: Once | ORAL | Status: AC
  Administered 2022-06-16: 1 via ORAL
  Filled 2022-06-16: qty 1

## 2022-06-16 MED ORDER — CEFAZOLIN SODIUM-DEXTROSE 1-4 GM/50ML-% IV SOLN
1.0000 g | Freq: Once | INTRAVENOUS | Status: AC
  Administered 2022-06-16: 1 g via INTRAVENOUS
  Filled 2022-06-16: qty 50

## 2022-06-16 MED ORDER — TETANUS-DIPHTH-ACELL PERTUSSIS 5-2.5-18.5 LF-MCG/0.5 IM SUSY
0.5000 mL | PREFILLED_SYRINGE | Freq: Once | INTRAMUSCULAR | Status: AC
  Administered 2022-06-17: 0.5 mL via INTRAMUSCULAR
  Filled 2022-06-16 (×3): qty 0.5

## 2022-06-16 MED ORDER — ONDANSETRON HCL 4 MG/2ML IJ SOLN
4.0000 mg | Freq: Once | INTRAMUSCULAR | Status: AC
  Administered 2022-06-16: 4 mg via INTRAVENOUS
  Filled 2022-06-16: qty 2

## 2022-06-16 NOTE — Anesthesia Preprocedure Evaluation (Addendum)
Anesthesia Evaluation  Patient identified by MRN, date of birth, ID band Patient awake    Reviewed: Allergy & Precautions, NPO status , Patient's Chart, lab work & pertinent test results  History of Anesthesia Complications Negative for: history of anesthetic complications  Airway Mallampati: I  TM Distance: >3 FB Neck ROM: Full    Dental  (+) Edentulous Upper, Edentulous Lower   Pulmonary sleep apnea , COPD,  COPD inhaler, Patient abstained from smoking., former smoker   breath sounds clear to auscultation       Cardiovascular hypertension, Pt. on medications (-) angina  Rhythm:Regular Rate:Normal  '16 Stress: No evidence of ischemia or infarct and EF is normal.   Neuro/Psych  Headaches  Anxiety Depression       GI/Hepatic Neg liver ROS,GERD  Medicated and Controlled,,  Endo/Other  BMI 33.8  Renal/GU negative Renal ROS     Musculoskeletal   Abdominal  (+) + obese  Peds  Hematology negative hematology ROS (+)   Anesthesia Other Findings   Reproductive/Obstetrics                             Anesthesia Physical Anesthesia Plan  ASA: 3  Anesthesia Plan: General   Post-op Pain Management: Ofirmev IV (intra-op)*   Induction: Intravenous and Rapid sequence  PONV Risk Score and Plan: 3 and Ondansetron, Dexamethasone and Treatment may vary due to age or medical condition  Airway Management Planned: Oral ETT  Additional Equipment: None  Intra-op Plan:   Post-operative Plan: Extubation in OR  Informed Consent: I have reviewed the patients History and Physical, chart, labs and discussed the procedure including the risks, benefits and alternatives for the proposed anesthesia with the patient or authorized representative who has indicated his/her understanding and acceptance.       Plan Discussed with: CRNA and Surgeon  Anesthesia Plan Comments:         Anesthesia Quick  Evaluation
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
## 2022-06-16 NOTE — ED Triage Notes (Signed)
Pt. Stated, I had my dog bite today by my own dog a pit bull. On December 18,  I fell out of bed and hit my head.  Pt has bruised areas around my eyes and forehead

## 2022-06-16 NOTE — ED Provider Triage Note (Addendum)
Emergency Medicine Provider Triage Evaluation Note  Tiffany Murphy , a 71 y.o. female  was evaluated in triage.  Pt complains of pain, bleeding, swelling of right wrist after dog bite with pitbull earlier today.  Patient reports that she is up-to-date on her tetanus.  She arrives with the wrist wrapped, she has multiple lacerations that are likely to require repair, endorses significant pain in the wrist, question step-off or deformity although there is a fair amount of soft tissue swelling.  Patient's ring removed in triage on the ipsilateral hand.  Review of Systems  Positive: Dog bite, laceration, wrist and forearm pain Negative: Fever, chills  Physical Exam  BP (!) 151/108 (BP Location: Right Arm)   Pulse 72   Temp 98.2 F (36.8 C) (Oral)   Resp 18   SpO2 98%  Gen:   Awake, no distress   Resp:  Normal effort  MSK:   Moves extremities without difficulty  Other:  Radial, ulnar pulses intact, multiple lacerations noted on right wrist, question step-off of right radius  Medical Decision Making  Medically screening exam initiated at 10:50 AM.  Appropriate orders placed.  Tiffany Murphy was informed that the remainder of the evaluation will be completed by another provider, this initial triage assessment does not replace that evaluation, and the importance of remaining in the ED until their evaluation is complete.  Workup initiated   Anselmo Pickler, PA-C 06/16/22 1054  On additional assessment patient reports that the bruising on her face has not been seen or evaluated before, she has been having frequent falls, denies numbness, tingling, she does not take a blood thinner.   Anselmo Pickler, [place] 06/16/22 1117

## 2022-06-16 NOTE — ED Triage Notes (Signed)
Dog bite today breaking up a dog fight Bite to right forearm. Swelling to right hand.  Also c/o fall out of bed on 06/06/22 has bruising to face.  Was seen at [facility] but left before being seen.

## 2022-06-16 NOTE — ED Notes (Signed)
Report given to [facility] charge RN Melanie. Plunkett MD accepts this pt. IV secured. Wound re-dressed in fresh wet gauze. Monitored after pain medication administration to assure stable for transport.

## 2022-06-16 NOTE — ED Provider Notes (Signed)
[facility] DE[facility] Provider Note   CSN: [id number] Arrival date & time: 06/16/22  1731     History  Chief Complaint  Patient presents with   Animal Bite    Tiffany Murphy is a 71 y.o. female.  71 yo F with a chief complaints of dog bite.  The patient had broken up a fight between 2 dogs and 1 ended up biting onto her right forearm.  She suffered a laceration to the area.  She went to the [facility] and then ended up leaving without being seen due to the long wait times.  She then ended up coming here for evaluation.  She suffered a significant laceration to her forearm.  Has some pain and swelling there.  She had unfortunately had a fall recently and had bumped her head.   Animal Bite      Home Medications Prior to Admission medications   Medication Sig Start Date End Date Taking? Authorizing Provider  acyclovir (ZOVIRAX) 200 MG capsule Take 200 mg by mouth every morning. 08/17/21   [provider]  albuterol (PROVENTIL) (2.5 MG/3ML) 0.083% nebulizer solution Take 3 mLs (2.5 mg total) by nebulization every 6 (six) hours as needed for wheezing or shortness of breath. 08/27/21   Laurin Coder, MD  albuterol (VENTOLIN HFA) 108 (90 Base) MCG/ACT inhaler Inhale 2 puffs into the lungs every 6 (six) hours as needed for wheezing. 10/15/21   Dorna Mai, MD  buPROPion (WELLBUTRIN XL) 300 MG 24 hr tablet Take 1 tablet by mouth once daily 06/02/22   Libby Maw, MD  celecoxib (CELEBREX) 200 MG capsule Take 1 capsule (200 mg total) by mouth daily as needed. 05/03/22   Libby Maw, MD  Cyanocobalamin (VITAMIN B 12 PO) Take 1 tablet by mouth daily.    [provider]  fluticasone Asencion Islam) 50 MCG/ACT nasal spray Place 2 sprays into each nostril once a day 05/03/22   Libby Maw, MD  Fluticasone-Umeclidin-Vilant (TRELEGY ELLIPTA) 100-62.5-25 MCG/ACT AEPB Inhale 1 puff into the lungs daily. INHALE 1 PUFF ONCE  DAILY 01/10/22   Sherrilyn Rist A, MD  gabapentin (NEURONTIN) 300 MG capsule TAKE 1 CAPSULE BY MOUTH IN THE MORNING AND 1 CAPSULE IN THE AFTERNOON AND 2 CAPSULES AT BEDTIME 03/24/22   Libby Maw, MD  hydrocortisone (ANUSOL-HC) 2.5 % rectal cream Insert and apply rectally 2 times a day for 10 days 08/31/21     losartan (COZAAR) 100 MG tablet Take 1 tablet by mouth once daily 06/02/22   Libby Maw, MD  montelukast (SINGULAIR) 10 MG tablet Take 1 tablet (10 mg total) by mouth at bedtime. 04/04/22   Libby Maw, MD  Multiple Vitamin (MULTIVITAMIN WITH MINERALS) TABS tablet Take 1 tablet by mouth daily.    [provider]  Omega-3 Fatty Acids (FISH OIL) 1000 MG CPDR Take 1,000 mg by mouth daily.    [provider]  omeprazole (PRILOSEC) 40 MG capsule Take 1 capsule (40 mg total) by mouth daily. 05/03/22   Libby Maw, MD  polyethylene glycol powder [facility]) 17 GM/SCOOP powder Dissolve 1 scoop of medication in water daily as needed for constipation. 12/22/21   Libby Maw, MD  QUEtiapine (SEROQUEL) 25 MG tablet Take 1 tablet (25 mg total) by mouth at bedtime. 03/24/22   Libby Maw, MD  rosuvastatin (CRESTOR) 40 MG tablet TAKE 1/2 (ONE-HALF) TABLET BY MOUTH AT BEDTIME Strength: 40 mg 04/04/22   Libby Maw, MD  sertraline (ZOLOFT) 100 MG tablet Take 2 tablets by mouth once daily 04/20/22   Libby Maw, MD      Allergies    Penicillins, Lisinopril, Lovastatin, and Other    Review of Systems   Review of Systems  Physical Exam Updated Vital Signs BP 133/87 (BP Location: Left Arm)   Pulse 65   Temp 98 F (36.7 C) (Oral)   Resp (!) 21   SpO2 97%  Physical Exam Vitals and nursing note reviewed.  Constitutional:      General: She is not in acute distress.    Appearance: She is well-developed. She is not diaphoretic.  HENT:     Head: Normocephalic and atraumatic.  Eyes:     Pupils:  Pupils are equal, round, and reactive to light.  Cardiovascular:     Rate and Rhythm: Normal rate and regular rhythm.     Heart sounds: No murmur heard.    No friction rub. No gallop.  Pulmonary:     Effort: Pulmonary effort is normal.     Breath sounds: No wheezing or rales.  Abdominal:     General: There is no distension.     Palpations: Abdomen is soft.     Tenderness: There is no abdominal tenderness.  Musculoskeletal:        General: Swelling and tenderness present.     Cervical back: Normal range of motion and neck supple.     Comments: Pain and swelling to the right forearm.  Fairly large laceration over the dorsal aspect overlying the radius.  A punctate wound on the ulnar aspect also overlying the radius.  Skin:    General: Skin is warm and dry.  Neurological:     Mental Status: She is alert and oriented to person, place, and time.  Psychiatric:        Behavior: Behavior normal.     ED Results / Procedures / Treatments   Labs (all labs ordered are listed, but only abnormal results are displayed) Labs Reviewed - No data to display  EKG None  Radiology CT Maxillofacial Wo Contrast  Result Date: 06/16/2022 CLINICAL DATA:  Frequent falls, facial bruising. EXAM: CT HEAD WITHOUT CONTRAST CT MAXILLOFACIAL WITHOUT CONTRAST TECHNIQUE: Multidetector CT imaging of the head and maxillofacial structures were performed using the standard protocol without intravenous contrast. Multiplanar CT image reconstructions of the maxillofacial structures were also generated. RADIATION DOSE REDUCTION: This exam was performed according to the departmental dose-optimization program which includes automated exposure control, adjustment of the mA and/or kV according to patient size and/or use of iterative reconstruction technique. COMPARISON:  CT head 04/22/2020 and brain MRI 02/22/2020 FINDINGS: CT HEAD FINDINGS Brain: There is no acute intracranial hemorrhage, extra-axial fluid collection, or acute  infarct. Mild background parenchymal volume loss with prominence of the ventricular system and extra-axial CSF spaces is stable. The ventricles are stable in size since 2021. 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 There is no mass lesion.  There is no mass effect or midline shift. Vascular: There is calcification of the bilateral carotid siphons. Skull: Normal. Negative for fracture or focal lesion. Other: There is mild right forehead swelling. CT MAXILLOFACIAL FINDINGS Osseous: There is no acute facial bone fracture. There is no evidence of mandibular dislocation. There is no suspicious osseous lesion. Orbits: The globes and orbits are unremarkable. There is no retrobulbar hematoma. Sinuses: Clear. Soft tissues: Unremarkable. IMPRESSION: 1. No acute intracranial pathology. 2. Mild  right forehead swelling without underlying calvarial or facial bone fracture. Electronically Signed   By: Valetta Mole M.D.   On: 06/16/2022 12:51   CT Head Wo Contrast  Result Date: 06/16/2022 CLINICAL DATA:  Frequent falls, facial bruising. EXAM: CT HEAD WITHOUT CONTRAST CT MAXILLOFACIAL WITHOUT CONTRAST TECHNIQUE: Multidetector CT imaging of the head and maxillofacial structures were performed using the standard protocol without intravenous contrast. Multiplanar CT image reconstructions of the maxillofacial structures were also generated. RADIATION DOSE REDUCTION: This exam was performed according to the departmental dose-optimization program which includes automated exposure control, adjustment of the mA and/or kV according to patient size and/or use of iterative reconstruction technique. COMPARISON:  CT head 04/22/2020 and brain MRI 02/22/2020 FINDINGS: CT HEAD FINDINGS Brain: There is no acute intracranial hemorrhage, extra-axial fluid collection, or acute infarct. Mild background parenchymal volume loss with prominence of the ventricular system and  extra-axial CSF spaces is stable. The ventricles are stable in size since 2021. 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 There is no mass lesion.  There is no mass effect or midline shift. Vascular: There is calcification of the bilateral carotid siphons. Skull: Normal. Negative for fracture or focal lesion. Other: There is mild right forehead swelling. CT MAXILLOFACIAL FINDINGS Osseous: There is no acute facial bone fracture. There is no evidence of mandibular dislocation. There is no suspicious osseous lesion. Orbits: The globes and orbits are unremarkable. There is no retrobulbar hematoma. Sinuses: Clear. Soft tissues: Unremarkable. IMPRESSION: 1. No acute intracranial pathology. 2. Mild right forehead swelling without underlying calvarial or facial bone fracture. Electronically Signed   By: Valetta Mole M.D.   On: 06/16/2022 12:51   DG Forearm Right  Result Date: 06/16/2022 CLINICAL DATA:  Dog bite EXAM: RIGHT FOREARM - 2 VIEW COMPARISON:  Right wrist same day FINDINGS: Comminuted fracture distal radius and ulna. Fracture ulnar styloid. No fracture of the proximal forearm. Negative elbow. Extensive gas in the soft tissues around the wrist due to laceration. IMPRESSION: Comminuted fracture distal radius and ulna. Fracture ulnar styloid. Extensive gas in the soft tissues. Electronically Signed   By: Franchot Gallo M.D.   On: 06/16/2022 11:39   DG Wrist Complete Right  Result Date: 06/16/2022 CLINICAL DATA:  Dog bite EXAM: RIGHT WRIST - COMPLETE 3+ VIEW COMPARISON:  None Available. FINDINGS: Comminuted fracture distal radius with mild displacement. Fracture distal ulnar diaphysis and ulnar styloid. Extensive gas in the soft tissues due to laceration. No carpal bone fracture. IMPRESSION: 1. Comminuted fracture distal radius with mild displacement. 2. Fracture distal ulnar diaphysis and ulnar styloid. 3. Extensive gas in the soft tissues  from laceration. Electronically Signed   By: Franchot Gallo M.D.   On: 06/16/2022 11:38    Procedures Procedures    Medications Ordered in ED Medications  Tdap (BOOSTRIX) injection 0.5 mL (0.5 mLs Intramuscular Patient Refused/Not Given 06/16/22 2056)  ceFAZolin (ANCEF) IVPB 1 g/50 mL premix (0 g Intravenous Stopped 06/16/22 2123)  morphine (PF) 4 MG/ML injection 4 mg (4 mg Intravenous Given 06/16/22 2238)  ondansetron (ZOFRAN) injection 4 mg (4 mg Intravenous Given 06/16/22 2238)    ED Course/ Medical Decision Making/ A&P                           Medical Decision Making Risk Prescription drug management.   71 yo F with a chief complaints of dog bite.  Patient unfortunately has  a distal radius and ulnar fracture on plain film.  She has a fairly large laceration from a dog bite that is overlying the area.  Given Ancef and Tdap.  Discussed with hand surgery.  I discussed case with Dr. Fredna Dow.  Plans to take the patient to the OR this evening.  Will have me send her ED to ED to [facility].  I discussed this with Dr. Maryan Rued who accepts in transfer.  Will go POV.  The patients results and plan were reviewed and discussed.   Any x-rays performed were independently reviewed by myself.   Differential diagnosis were considered with the presenting HPI.  Medications  Tdap (BOOSTRIX) injection 0.5 mL (0.5 mLs Intramuscular Patient Refused/Not Given 06/16/22 2056)  ceFAZolin (ANCEF) IVPB 1 g/50 mL premix (0 g Intravenous Stopped 06/16/22 2123)  morphine (PF) 4 MG/ML injection 4 mg (4 mg Intravenous Given 06/16/22 2238)  ondansetron (ZOFRAN) injection 4 mg (4 mg Intravenous Given 06/16/22 2238)    Vitals:   06/16/22 1958 06/16/22 2000 06/16/22 2200 06/16/22 2235  BP: (!) 158/82 (!) 179/65 133/87   Pulse: 66 66 65   Resp: (!) 21 18 (!) 21   Temp:    98 F (36.7 C)  TempSrc:    Oral  SpO2: 98% 98% 97%     Final diagnoses:  Type I or II open fracture of distal end of right radius  with ulna, initial encounter  Dog bite, initial encounter            Final Clinical Impression(s) / ED Diagnoses Final diagnoses:  Type I or II open fracture of distal end of right radius with ulna, initial encounter  Dog bite, initial encounter    Rx / DC Orders ED Discharge Orders     None         Deno Etienne, DO 06/16/22 2303

## 2022-06-17 ENCOUNTER — Encounter (HOSPITAL_COMMUNITY)
Admission: EM | Disposition: A | Discharge status: Home / Self Care | Payer: Self-pay | Source: Home / Self Care | Attending: Internal Medicine

## 2022-06-17 ENCOUNTER — Emergency Department (HOSPITAL_COMMUNITY): Payer: Medicare Other

## 2022-06-17 ENCOUNTER — Emergency Department (HOSPITAL_BASED_OUTPATIENT_CLINIC_OR_DEPARTMENT_OTHER): Payer: Medicare Other | Admitting: Anesthesiology

## 2022-06-17 ENCOUNTER — Encounter (HOSPITAL_COMMUNITY): Payer: Self-pay | Admitting: Internal Medicine

## 2022-06-17 ENCOUNTER — Other Ambulatory Visit: Payer: Self-pay

## 2022-06-17 ENCOUNTER — Emergency Department (HOSPITAL_COMMUNITY): Payer: Medicare Other | Admitting: Anesthesiology

## 2022-06-17 DIAGNOSIS — S51811A Laceration without foreign body of right forearm, initial encounter: Secondary | ICD-10-CM | POA: Diagnosis present

## 2022-06-17 DIAGNOSIS — S52501A Unspecified fracture of the lower end of right radius, initial encounter for closed fracture: Secondary | ICD-10-CM | POA: Diagnosis not present

## 2022-06-17 DIAGNOSIS — W540XXA Bitten by dog, initial encounter: Secondary | ICD-10-CM | POA: Diagnosis not present

## 2022-06-17 DIAGNOSIS — S52201B Unspecified fracture of shaft of right ulna, initial encounter for open fracture type I or II: Secondary | ICD-10-CM

## 2022-06-17 DIAGNOSIS — F32A Depression, unspecified: Secondary | ICD-10-CM | POA: Diagnosis present

## 2022-06-17 DIAGNOSIS — S51851A Open bite of right forearm, initial encounter: Secondary | ICD-10-CM | POA: Diagnosis not present

## 2022-06-17 DIAGNOSIS — S52301B Unspecified fracture of shaft of right radius, initial encounter for open fracture type I or II: Secondary | ICD-10-CM

## 2022-06-17 DIAGNOSIS — Z79899 Other long term (current) drug therapy: Secondary | ICD-10-CM | POA: Diagnosis not present

## 2022-06-17 DIAGNOSIS — K219 Gastro-esophageal reflux disease without esophagitis: Secondary | ICD-10-CM

## 2022-06-17 DIAGNOSIS — J449 Chronic obstructive pulmonary disease, unspecified: Secondary | ICD-10-CM | POA: Diagnosis not present

## 2022-06-17 DIAGNOSIS — S61451A Open bite of right hand, initial encounter: Secondary | ICD-10-CM

## 2022-06-17 DIAGNOSIS — S52202B Unspecified fracture of shaft of left ulna, initial encounter for open fracture type I or II: Secondary | ICD-10-CM | POA: Diagnosis present

## 2022-06-17 DIAGNOSIS — Z23 Encounter for immunization: Secondary | ICD-10-CM | POA: Diagnosis not present

## 2022-06-17 DIAGNOSIS — F418 Other specified anxiety disorders: Secondary | ICD-10-CM

## 2022-06-17 DIAGNOSIS — F419 Anxiety disorder, unspecified: Secondary | ICD-10-CM | POA: Diagnosis present

## 2022-06-17 DIAGNOSIS — E782 Mixed hyperlipidemia: Secondary | ICD-10-CM | POA: Diagnosis not present

## 2022-06-17 DIAGNOSIS — Z85828 Personal history of other malignant neoplasm of skin: Secondary | ICD-10-CM | POA: Diagnosis not present

## 2022-06-17 DIAGNOSIS — S52201A Unspecified fracture of shaft of right ulna, initial encounter for closed fracture: Secondary | ICD-10-CM

## 2022-06-17 DIAGNOSIS — K589 Irritable bowel syndrome without diarrhea: Secondary | ICD-10-CM | POA: Diagnosis present

## 2022-06-17 DIAGNOSIS — Z87891 Personal history of nicotine dependence: Secondary | ICD-10-CM | POA: Diagnosis not present

## 2022-06-17 DIAGNOSIS — Z90710 Acquired absence of both cervix and uterus: Secondary | ICD-10-CM | POA: Diagnosis not present

## 2022-06-17 DIAGNOSIS — G4733 Obstructive sleep apnea (adult) (pediatric): Secondary | ICD-10-CM

## 2022-06-17 DIAGNOSIS — Z880 Allergy status to penicillin: Secondary | ICD-10-CM | POA: Diagnosis not present

## 2022-06-17 DIAGNOSIS — Z8249 Family history of ischemic heart disease and other diseases of the circulatory system: Secondary | ICD-10-CM | POA: Diagnosis not present

## 2022-06-17 DIAGNOSIS — I10 Essential (primary) hypertension: Secondary | ICD-10-CM

## 2022-06-17 DIAGNOSIS — S52501B Unspecified fracture of the lower end of right radius, initial encounter for open fracture type I or II: Secondary | ICD-10-CM | POA: Diagnosis not present

## 2022-06-17 DIAGNOSIS — Z888 Allergy status to other drugs, medicaments and biological substances status: Secondary | ICD-10-CM | POA: Diagnosis not present

## 2022-06-17 DIAGNOSIS — J4489 Other specified chronic obstructive pulmonary disease: Secondary | ICD-10-CM

## 2022-06-17 DIAGNOSIS — Z8719 Personal history of other diseases of the digestive system: Secondary | ICD-10-CM | POA: Diagnosis not present

## 2022-06-17 DIAGNOSIS — S52601B Unspecified fracture of lower end of right ulna, initial encounter for open fracture type I or II: Secondary | ICD-10-CM | POA: Diagnosis not present

## 2022-06-17 HISTORY — PX: I & D EXTREMITY: SHX5045

## 2022-06-17 LAB — CBC WITH DIFFERENTIAL/PLATELET
Abs Immature Granulocytes: 0.03 10*3/uL (ref 0.00–0.07)
Basophils Absolute: 0 10*3/uL (ref 0.0–0.1)
Basophils Relative: 0 %
Eosinophils Absolute: 0 10*3/uL (ref 0.0–0.5)
Eosinophils Relative: 0 %
HCT: 40.5 % (ref 36.0–46.0)
Hemoglobin: 13.4 g/dL (ref 12.0–15.0)
Immature Granulocytes: 0 %
Lymphocytes Relative: 6 %
Lymphs Abs: 0.5 10*3/uL — ABNORMAL LOW (ref 0.7–4.0)
MCH: 31.9 pg (ref 26.0–34.0)
MCHC: 33.1 g/dL (ref 30.0–36.0)
MCV: 96.4 fL (ref 80.0–100.0)
Monocytes Absolute: 0.4 10*3/uL (ref 0.1–1.0)
Monocytes Relative: 5 %
Neutro Abs: 8.1 10*3/uL — ABNORMAL HIGH (ref 1.7–7.7)
Neutrophils Relative %: 89 %
Platelets: 179 10*3/uL (ref 150–400)
RBC: 4.2 MIL/uL (ref 3.87–5.11)
RDW: 14.4 % (ref 11.5–15.5)
WBC: 9.1 10*3/uL (ref 4.0–10.5)
nRBC: 0 % (ref 0.0–0.2)

## 2022-06-17 LAB — COMPREHENSIVE METABOLIC PANEL
ALT: 19 U/L (ref 0–44)
AST: 21 U/L (ref 15–41)
Albumin: 3.5 g/dL (ref 3.5–5.0)
Alkaline Phosphatase: 52 U/L (ref 38–126)
Anion gap: 8 (ref 5–15)
BUN: 11 mg/dL (ref 8–23)
CO2: 23 mmol/L (ref 22–32)
Calcium: 8.5 mg/dL — ABNORMAL LOW (ref 8.9–10.3)
Chloride: 107 mmol/L (ref 98–111)
Creatinine, Ser: 0.88 mg/dL (ref 0.44–1.00)
GFR, Estimated: >60 mL/min (ref >60)
Glucose, Bld: 172 mg/dL — ABNORMAL HIGH (ref 70–99)
Potassium: 4.4 mmol/L (ref 3.5–5.1)
Sodium: 138 mmol/L (ref 135–145)
Total Bilirubin: 0.6 mg/dL (ref 0.3–1.2)
Total Protein: 6.3 g/dL — ABNORMAL LOW (ref 6.5–8.1)

## 2022-06-17 LAB — MRSA NEXT GEN BY PCR, NASAL: MRSA by PCR Next Gen: NOT DETECTED

## 2022-06-17 LAB — APTT: aPTT: 28 seconds (ref 24–36)

## 2022-06-17 LAB — MAGNESIUM: Magnesium: 1.9 mg/dL (ref 1.7–2.4)

## 2022-06-17 LAB — PROTIME-INR
INR: 1 (ref 0.8–1.2)
Prothrombin Time: 13.5 seconds (ref 11.4–15.2)

## 2022-06-17 SURGERY — IRRIGATION AND DEBRIDEMENT EXTREMITY
Anesthesia: General | Site: Arm Lower | Laterality: Right

## 2022-06-17 MED ORDER — GABAPENTIN 300 MG PO CAPS
300.0000 mg | ORAL_CAPSULE | Freq: Two times a day (BID) | ORAL | Status: DC
  Administered 2022-06-17 – 2022-06-18 (×2): 300 mg via ORAL
  Filled 2022-06-17 (×2): qty 1

## 2022-06-17 MED ORDER — PANTOPRAZOLE SODIUM 40 MG PO TBEC
40.0000 mg | DELAYED_RELEASE_TABLET | Freq: Every day | ORAL | Status: DC
  Administered 2022-06-17 – 2022-06-18 (×2): 40 mg via ORAL
  Filled 2022-06-17 (×2): qty 1

## 2022-06-17 MED ORDER — SODIUM CHLORIDE 0.9 % IV SOLN
100.0000 mg | INTRAVENOUS | Status: AC
  Administered 2022-06-17: 100 mg via INTRAVENOUS
  Filled 2022-06-17: qty 100

## 2022-06-17 MED ORDER — MIDAZOLAM HCL 2 MG/2ML IJ SOLN: 0.5000 mg | Freq: Once | INTRAMUSCULAR | Status: DC | PRN

## 2022-06-17 MED ORDER — OXYCODONE HCL 5 MG/5ML PO SOLN: 5.0000 mg | Freq: Once | ORAL | Status: DC | PRN

## 2022-06-17 MED ORDER — MORPHINE SULFATE (PF) 2 MG/ML IV SOLN
2.0000 mg | INTRAVENOUS | Status: DC | PRN
  Administered 2022-06-17 (×2): 2 mg via INTRAVENOUS
  Filled 2022-06-17 (×2): qty 1

## 2022-06-17 MED ORDER — ONDANSETRON HCL 4 MG PO TABS
4.0000 mg | ORAL_TABLET | Freq: Four times a day (QID) | ORAL | Status: DC | PRN
  Administered 2022-06-18: 4 mg via ORAL
  Filled 2022-06-17: qty 1

## 2022-06-17 MED ORDER — OXYCODONE HCL 5 MG PO TABS: 5.0000 mg | ORAL_TABLET | Freq: Once | ORAL | Status: DC | PRN

## 2022-06-17 MED ORDER — SUCCINYLCHOLINE CHLORIDE 200 MG/10ML IV SOSY
PREFILLED_SYRINGE | INTRAVENOUS | Status: AC
  Filled 2022-06-17: qty 10

## 2022-06-17 MED ORDER — SERTRALINE HCL 100 MG PO TABS
200.0000 mg | ORAL_TABLET | Freq: Every day | ORAL | Status: DC
  Administered 2022-06-17 – 2022-06-18 (×2): 200 mg via ORAL
  Filled 2022-06-17 (×2): qty 2

## 2022-06-17 MED ORDER — FENTANYL CITRATE (PF) 100 MCG/2ML IJ SOLN
INTRAMUSCULAR | Status: DC | PRN
  Administered 2022-06-17 (×3): 50 ug via INTRAVENOUS

## 2022-06-17 MED ORDER — SODIUM CHLORIDE 0.9 % IR SOLN
Status: DC | PRN
  Administered 2022-06-17: 3000 mL

## 2022-06-17 MED ORDER — SODIUM CHLORIDE 0.9 % IV SOLN
1.0000 g | INTRAVENOUS | Status: AC
  Administered 2022-06-17: 1 g via INTRAVENOUS
  Filled 2022-06-17: qty 10

## 2022-06-17 MED ORDER — LACTATED RINGERS IV SOLN: INTRAVENOUS | Status: AC

## 2022-06-17 MED ORDER — METRONIDAZOLE 500 MG/100ML IV SOLN
500.0000 mg | Freq: Three times a day (TID) | INTRAVENOUS | Status: DC
  Administered 2022-06-17 – 2022-06-18 (×4): 500 mg via INTRAVENOUS
  Filled 2022-06-17 (×4): qty 100

## 2022-06-17 MED ORDER — ACETAMINOPHEN 650 MG RE SUPP: 650.0000 mg | Freq: Four times a day (QID) | RECTAL | Status: DC | PRN

## 2022-06-17 MED ORDER — GABAPENTIN 300 MG PO CAPS
600.0000 mg | ORAL_CAPSULE | Freq: Every day | ORAL | Status: DC
  Administered 2022-06-17: 600 mg via ORAL
  Filled 2022-06-17: qty 2

## 2022-06-17 MED ORDER — ROSUVASTATIN CALCIUM 20 MG PO TABS
20.0000 mg | ORAL_TABLET | Freq: Every day | ORAL | Status: DC
  Administered 2022-06-17: 20 mg via ORAL
  Filled 2022-06-17: qty 1

## 2022-06-17 MED ORDER — ONDANSETRON HCL 4 MG/2ML IJ SOLN: 4.0000 mg | Freq: Four times a day (QID) | INTRAMUSCULAR | Status: DC | PRN

## 2022-06-17 MED ORDER — SODIUM CHLORIDE 0.9 % IV SOLN: 100.0000 mg | Freq: Two times a day (BID) | INTRAVENOUS | Status: DC

## 2022-06-17 MED ORDER — OXYCODONE-ACETAMINOPHEN 5-325 MG PO TABS: 1.0000 | ORAL_TABLET | ORAL | Status: DC | PRN

## 2022-06-17 MED ORDER — ONDANSETRON HCL 4 MG/2ML IJ SOLN
INTRAMUSCULAR | Status: AC
  Filled 2022-06-17: qty 2

## 2022-06-17 MED ORDER — HYDROMORPHONE HCL 1 MG/ML IJ SOLN
0.2500 mg | INTRAMUSCULAR | Status: DC | PRN
  Administered 2022-06-17: 0.5 mg via INTRAVENOUS

## 2022-06-17 MED ORDER — GABAPENTIN 300 MG PO CAPS: 300.0000 mg | ORAL_CAPSULE | Freq: Every morning | ORAL | Status: DC

## 2022-06-17 MED ORDER — ACETAMINOPHEN 500 MG PO TABS: 500.0000 mg | ORAL_TABLET | Freq: Four times a day (QID) | ORAL | Status: DC

## 2022-06-17 MED ORDER — 0.9 % SODIUM CHLORIDE (POUR BTL) OPTIME
TOPICAL | Status: DC | PRN
  Administered 2022-06-17: 1000 mL

## 2022-06-17 MED ORDER — FLUTICASONE FUROATE-VILANTEROL 100-25 MCG/ACT IN AEPB
1.0000 | INHALATION_SPRAY | Freq: Every day | RESPIRATORY_TRACT | Status: DC
  Administered 2022-06-18: 1 via RESPIRATORY_TRACT
  Filled 2022-06-17: qty 28

## 2022-06-17 MED ORDER — HYDROMORPHONE HCL 1 MG/ML IJ SOLN
INTRAMUSCULAR | Status: AC
  Filled 2022-06-17: qty 1

## 2022-06-17 MED ORDER — VITAMIN C 500 MG PO TABS
1000.0000 mg | ORAL_TABLET | Freq: Every day | ORAL | Status: DC
  Administered 2022-06-17 – 2022-06-18 (×2): 1000 mg via ORAL
  Filled 2022-06-17 (×2): qty 2

## 2022-06-17 MED ORDER — DEXAMETHASONE SODIUM PHOSPHATE 4 MG/ML IJ SOLN
INTRAMUSCULAR | Status: DC | PRN
  Administered 2022-06-17: 5 mg via INTRAVENOUS

## 2022-06-17 MED ORDER — PROPOFOL 10 MG/ML IV BOLUS
INTRAVENOUS | Status: DC | PRN
  Administered 2022-06-17: 150 mg via INTRAVENOUS

## 2022-06-17 MED ORDER — LIDOCAINE 2% (20 MG/ML) 5 ML SYRINGE
INTRAMUSCULAR | Status: AC
  Filled 2022-06-17: qty 5

## 2022-06-17 MED ORDER — FLUTICASONE PROPIONATE 50 MCG/ACT NA SUSP
1.0000 | Freq: Every day | NASAL | Status: DC
  Administered 2022-06-17: 1 via NASAL
  Filled 2022-06-17: qty 16

## 2022-06-17 MED ORDER — PROPOFOL 10 MG/ML IV BOLUS
INTRAVENOUS | Status: AC
  Filled 2022-06-17: qty 20

## 2022-06-17 MED ORDER — ACETAMINOPHEN 325 MG PO TABS: 325.0000 mg | ORAL_TABLET | Freq: Four times a day (QID) | ORAL | Status: DC | PRN

## 2022-06-17 MED ORDER — POLYETHYLENE GLYCOL 3350 17 G PO PACK: 17.0000 g | PACK | Freq: Every day | ORAL | Status: DC | PRN

## 2022-06-17 MED ORDER — GABAPENTIN 300 MG PO CAPS: 600.0000 mg | ORAL_CAPSULE | Freq: Every day | ORAL | Status: DC

## 2022-06-17 MED ORDER — LACTATED RINGERS IV SOLN: INTRAVENOUS | Status: DC | PRN

## 2022-06-17 MED ORDER — MORPHINE SULFATE (PF) 4 MG/ML IV SOLN
4.0000 mg | INTRAVENOUS | Status: DC | PRN
  Administered 2022-06-17: 4 mg via INTRAVENOUS
  Filled 2022-06-17: qty 1

## 2022-06-17 MED ORDER — LOSARTAN POTASSIUM 50 MG PO TABS
100.0000 mg | ORAL_TABLET | Freq: Every day | ORAL | Status: DC
  Administered 2022-06-17 – 2022-06-18 (×2): 100 mg via ORAL
  Filled 2022-06-17 (×2): qty 2

## 2022-06-17 MED ORDER — QUETIAPINE FUMARATE 25 MG PO TABS
25.0000 mg | ORAL_TABLET | Freq: Every day | ORAL | Status: DC
  Administered 2022-06-17: 25 mg via ORAL
  Filled 2022-06-17: qty 1

## 2022-06-17 MED ORDER — BUPIVACAINE HCL (PF) 0.25 % IJ SOLN
INTRAMUSCULAR | Status: DC | PRN
  Administered 2022-06-17: 9 mL

## 2022-06-17 MED ORDER — GABAPENTIN 300 MG PO CAPS: 300.0000 mg | ORAL_CAPSULE | Freq: Every day | ORAL | Status: DC

## 2022-06-17 MED ORDER — SUCCINYLCHOLINE CHLORIDE 20 MG/ML IJ SOLN
INTRAMUSCULAR | Status: DC | PRN
  Administered 2022-06-17: 120 mg via INTRAVENOUS

## 2022-06-17 MED ORDER — DEXMEDETOMIDINE HCL IN NACL 80 MCG/20ML IV SOLN
INTRAVENOUS | Status: DC | PRN
  Administered 2022-06-17: 12 ug via BUCCAL

## 2022-06-17 MED ORDER — LIDOCAINE HCL (CARDIAC) PF 50 MG/5ML IV SOSY
PREFILLED_SYRINGE | INTRAVENOUS | Status: DC | PRN
  Administered 2022-06-17: 20 mg via INTRAVENOUS

## 2022-06-17 MED ORDER — HYDROCODONE-ACETAMINOPHEN 5-325 MG PO TABS: 1.0000 | ORAL_TABLET | ORAL | Status: DC | PRN

## 2022-06-17 MED ORDER — MONTELUKAST SODIUM 10 MG PO TABS
10.0000 mg | ORAL_TABLET | Freq: Every day | ORAL | Status: DC
  Administered 2022-06-17: 10 mg via ORAL
  Filled 2022-06-17: qty 1

## 2022-06-17 MED ORDER — PROMETHAZINE HCL 25 MG/ML IJ SOLN: 6.2500 mg | INTRAMUSCULAR | Status: DC | PRN

## 2022-06-17 MED ORDER — MORPHINE SULFATE (PF) 2 MG/ML IV SOLN: 0.5000 mg | INTRAVENOUS | Status: DC | PRN

## 2022-06-17 MED ORDER — MORPHINE SULFATE (PF) 4 MG/ML IV SOLN
4.0000 mg | Freq: Once | INTRAVENOUS | Status: AC
  Administered 2022-06-17: 4 mg via INTRAVENOUS
  Filled 2022-06-17: qty 1

## 2022-06-17 MED ORDER — ONDANSETRON HCL 4 MG/2ML IJ SOLN
INTRAMUSCULAR | Status: DC | PRN
  Administered 2022-06-17: 4 mg via INTRAVENOUS

## 2022-06-17 MED ORDER — SODIUM CHLORIDE 0.9 % IV SOLN
2.0000 g | INTRAVENOUS | Status: DC
  Administered 2022-06-18: 2 g via INTRAVENOUS
  Filled 2022-06-17: qty 20

## 2022-06-17 MED ORDER — ACETAMINOPHEN 325 MG PO TABS
650.0000 mg | ORAL_TABLET | Freq: Four times a day (QID) | ORAL | Status: DC | PRN
  Filled 2022-06-17: qty 2

## 2022-06-17 MED ORDER — ALBUTEROL SULFATE (2.5 MG/3ML) 0.083% IN NEBU: 2.5000 mg | INHALATION_SOLUTION | Freq: Four times a day (QID) | RESPIRATORY_TRACT | Status: DC | PRN

## 2022-06-17 MED ORDER — ACYCLOVIR 200 MG PO CAPS
200.0000 mg | ORAL_CAPSULE | Freq: Every day | ORAL | Status: DC | PRN
  Filled 2022-06-17: qty 1

## 2022-06-17 MED ORDER — BUPIVACAINE HCL (PF) 0.25 % IJ SOLN
INTRAMUSCULAR | Status: AC
  Filled 2022-06-17: qty 30

## 2022-06-17 MED ORDER — MEPERIDINE HCL 25 MG/ML IJ SOLN: 6.2500 mg | INTRAMUSCULAR | Status: DC | PRN

## 2022-06-17 MED ORDER — FENTANYL CITRATE (PF) 250 MCG/5ML IJ SOLN
INTRAMUSCULAR | Status: AC
  Filled 2022-06-17: qty 5

## 2022-06-17 MED ORDER — UMECLIDINIUM BROMIDE 62.5 MCG/ACT IN AEPB
1.0000 | INHALATION_SPRAY | Freq: Every day | RESPIRATORY_TRACT | Status: DC
  Administered 2022-06-18: 1 via RESPIRATORY_TRACT
  Filled 2022-06-17: qty 7

## 2022-06-17 MED ORDER — BUPROPION HCL ER (XL) 150 MG PO TB24
300.0000 mg | ORAL_TABLET | Freq: Every day | ORAL | Status: DC
  Administered 2022-06-18: 300 mg via ORAL
  Filled 2022-06-17: qty 2

## 2022-06-17 SURGICAL SUPPLY — 63 items
BAG COUNTER SPONGE SURGICOUNT (BAG) ×2 IMPLANT
BNDG COHESIVE 2X5 TAN STRL LF (GAUZE/BANDAGES/DRESSINGS) IMPLANT
BNDG ELASTIC 3X5.8 VLCR NS LF (GAUZE/BANDAGES/DRESSINGS) ×1 IMPLANT
BNDG ELASTIC 3X5.8 VLCR STR LF (GAUZE/BANDAGES/DRESSINGS) ×2 IMPLANT
BNDG ELASTIC 4X5.8 VLCR NS LF (GAUZE/BANDAGES/DRESSINGS) ×1 IMPLANT
BNDG ELASTIC 4X5.8 VLCR STR LF (GAUZE/BANDAGES/DRESSINGS) ×2 IMPLANT
BNDG ESMARK 4X9 LF (GAUZE/BANDAGES/DRESSINGS) ×2 IMPLANT
BNDG GAUZE DERMACEA FLUFF 4 (GAUZE/BANDAGES/DRESSINGS) ×2 IMPLANT
CANNULA VESSEL 3MM 2 BLNT TIP (CANNULA) IMPLANT
CORD BIPOLAR FORCEPS 12FT (ELECTRODE) ×2 IMPLANT
COVER SURGICAL LIGHT HANDLE (MISCELLANEOUS) ×2 IMPLANT
CUFF TOURN SGL QUICK 18X4 (TOURNIQUET CUFF) IMPLANT
CUFF TOURN SGL QUICK 24 (TOURNIQUET CUFF)
CUFF TRNQT CYL 24X4X16.5-23 (TOURNIQUET CUFF) IMPLANT
DRAIN PENROSE 1/4X12 LTX STRL (WOUND CARE) IMPLANT
DRSG ADAPTIC 3X8 NADH LF (GAUZE/BANDAGES/DRESSINGS) ×2 IMPLANT
DRSG EMULSION OIL 3X3 NADH (GAUZE/BANDAGES/DRESSINGS) ×2 IMPLANT
GAUZE PACKING IODOFORM 1/4X15 (PACKING) ×1 IMPLANT
GAUZE PAD ABD 8X10 STRL (GAUZE/BANDAGES/DRESSINGS) ×4 IMPLANT
GAUZE SPONGE 4X4 12PLY STRL (GAUZE/BANDAGES/DRESSINGS) ×2 IMPLANT
GAUZE XEROFORM 1X8 LF (GAUZE/BANDAGES/DRESSINGS) ×2 IMPLANT
GLOVE BIO SURGEON STRL SZ7.5 (GLOVE) ×2 IMPLANT
GLOVE BIOGEL PI IND STRL 8 (GLOVE) ×2 IMPLANT
GLOVE BIOGEL PI IND STRL 8.5 (GLOVE) IMPLANT
GLOVE PI ORTHO PRO STRL SZ8 (GLOVE) IMPLANT
GLOVE SURG ORTHO 8.0 STRL STRW (GLOVE) IMPLANT
GOWN STRL REUS W/ TWL LRG LVL3 (GOWN DISPOSABLE) ×2 IMPLANT
GOWN STRL REUS W/ TWL XL LVL3 (GOWN DISPOSABLE) ×2 IMPLANT
GOWN STRL REUS W/TWL LRG LVL3 (GOWN DISPOSABLE) ×2
GOWN STRL REUS W/TWL XL LVL3 (GOWN DISPOSABLE) ×2
HANDPIECE INTERPULSE COAX TIP (DISPOSABLE)
KIT BASIN OR (CUSTOM PROCEDURE TRAY) ×2 IMPLANT
KIT TURNOVER KIT B (KITS) ×2 IMPLANT
LOOP VESSEL MAXI BLUE (MISCELLANEOUS) IMPLANT
MANIFOLD NEPTUNE II (INSTRUMENTS) ×2 IMPLANT
NDL HYPO 25X1 1.5 SAFETY (NEEDLE) IMPLANT
NEEDLE HYPO 25X1 1.5 SAFETY (NEEDLE) IMPLANT
NS IRRIG 1000ML POUR BTL (IV SOLUTION) ×2 IMPLANT
PACK ORTHO EXTREMITY (CUSTOM PROCEDURE TRAY) ×2 IMPLANT
PAD ARMBOARD 7.5X6 YLW CONV (MISCELLANEOUS) ×4 IMPLANT
PAD CAST 4YDX4 CTTN HI CHSV (CAST SUPPLIES) ×1 IMPLANT
PADDING CAST COTTON 4X4 STRL (CAST SUPPLIES) ×2
SET CYSTO W/LG BORE CLAMP LF (SET/KITS/TRAYS/PACK) IMPLANT
SET HNDPC FAN SPRY TIP SCT (DISPOSABLE) IMPLANT
SLING ARM FOAM STRAP MED (SOFTGOODS) ×1 IMPLANT
SOL PREP POV-IOD 4OZ 10% (MISCELLANEOUS) ×4 IMPLANT
SPIKE FLUID TRANSFER (MISCELLANEOUS) ×2 IMPLANT
SPONGE T-LAP 18X18 ~~LOC~~+RFID (SPONGE) ×2 IMPLANT
SPONGE T-LAP 4X18 ~~LOC~~+RFID (SPONGE) ×2 IMPLANT
SUT ETHILON 4 0 P 3 18 (SUTURE) IMPLANT
SUT ETHILON 4 0 PS 2 18 (SUTURE) IMPLANT
SUT MON AB 5-0 P3 18 (SUTURE) IMPLANT
SWAB COLLECTION DEVICE MRSA (MISCELLANEOUS) IMPLANT
SWAB CULTURE ESWAB REG 1ML (MISCELLANEOUS) IMPLANT
SYR 20ML LL LF (SYRINGE) ×2 IMPLANT
SYR CONTROL 10ML LL (SYRINGE) IMPLANT
TOWEL GREEN STERILE (TOWEL DISPOSABLE) ×2 IMPLANT
TOWEL GREEN STERILE FF (TOWEL DISPOSABLE) ×2 IMPLANT
TUBE CONNECTING 12X1/4 (SUCTIONS) ×2 IMPLANT
TUBE FEEDING ENTERAL 5FR 16IN (TUBING) IMPLANT
UNDERPAD 30X36 HEAVY ABSORB (UNDERPADS AND DIAPERS) ×2 IMPLANT
WATER STERILE IRR 1000ML POUR (IV SOLUTION) ×2 IMPLANT
YANKAUER SUCT BULB TIP NO VENT (SUCTIONS) ×2 IMPLANT

## 2022-06-17 NOTE — Transfer of Care (Signed)
Immediate Anesthesia Transfer of Care Note  Patient: Tiffany Murphy  Procedure(s) Performed: IRRIGATION AND DEBRIDEMENT RIGHT UPPER EXTREMITY (Right: Arm Lower)  Patient Location: PACU  Anesthesia Type:General  Level of Consciousness: drowsy  Airway & Oxygen Therapy: Patient Spontanous Breathing and Patient connected to nasal cannula oxygen  Post-op Assessment: Report given to RN, Post -op Vital signs reviewed and stable, and Patient moving all extremities  Post vital signs: Reviewed and stable  Last Vitals:  Vitals Value Taken Time  BP 140/61 06/17/22 0402  Temp    Pulse 78 06/17/22 0407  Resp 16 06/17/22 0407  SpO2 97 % 06/17/22 0407  Vitals shown include unvalidated device data.  Last Pain:  Vitals:   06/17/22 0405  TempSrc:   PainSc: 0-No pain         Complications: No notable events documented.

## 2022-06-17 NOTE — Assessment & Plan Note (Signed)
Continuing home regimen of daily PPI therapy.  

## 2022-06-17 NOTE — Anesthesia Postprocedure Evaluation (Signed)
Anesthesia Post Note  Patient: Tiffany Murphy  Procedure(s) Performed: IRRIGATION AND DEBRIDEMENT RIGHT UPPER EXTREMITY (Right: Arm Lower)     Patient location during evaluation: PACU Anesthesia Type: General Level of consciousness: patient cooperative, oriented and sedated Pain management: pain level controlled Vital Signs Assessment: post-procedure vital signs reviewed and stable Respiratory status: spontaneous breathing, nonlabored ventilation and respiratory function stable Cardiovascular status: blood pressure returned to baseline and stable Postop Assessment: no apparent nausea or vomiting Anesthetic complications: no   No notable events documented.  Last Vitals:  Vitals:   06/17/22 0515 06/17/22 0536  BP: (!) 145/68 (!) 148/59  Pulse: 70 70  Resp: 12 16  Temp: 36.7 C 36.8 C  SpO2: 95% 96%    Last Pain:  Vitals:   06/17/22 0536  TempSrc: Oral  PainSc:                  Shadman Tozzi,E. Acelin Ferdig

## 2022-06-17 NOTE — Assessment & Plan Note (Signed)
·   Please see assessment and plan above °

## 2022-06-17 NOTE — Assessment & Plan Note (Addendum)
Status post irrigation and debridement of the right forearm wound by Dr. Fredna Dow with orthopedic surgery AM of 12/29, their assistance is appreciated Dr. Fredna Dow recommends at least 24 hours of IV antibiotics with observation postoperatively which I wholeheartedly agree with Per Dr. Fredna Dow no evidence of gross infection in surgery Per current literature, treating with intravenous ceftriaxone and Flagyl Patient not exhibiting any SIRS criteria or evidence of sepsis Monitoring labs, blood cultures and monitoring for clinical evidence of infection If patient looks well on 12/30 will consider transition to oral antibiotics and discharged with outpatient orthopedic follow-up.

## 2022-06-17 NOTE — OR Nursing (Signed)
Silver diamond band removed from patient left ring finger and placed in clear plastic bag with patient label attached and attached to the chart.

## 2022-06-17 NOTE — H&P (Signed)
History and Physical    Patient: Tiffany Murphy MRN: [id number] [place]: 06/16/2022  Date of Service: the patient was seen and examined on 06/17/2022  Patient coming from: Home via [place]  Chief Complaint:  Chief Complaint  Patient presents with   Animal Bite    HPI:   71 year old female with past medical history of COPD and Asthma,  hypertension, gastroesophageal reflux disease, hyperlipidemia, major depressive disorder, anxiety disorder, obstructive sleep apnea former history of smoking who originally presented to the [facility] emergency department after experiencing a dog bite to the right forearm.  Patient explains that she was attempting to break up a fight between 2 dogs.  1 ended up biting her on the right forearm as a result.  This bite wound/laceration was immediately associated with severe pain, sharp in quality, radiating proximally, worse with the affected extremity and associated with ongoing bleeding.  She complains of associated swelling to the arm in the hours that followed.  Patient initially presented to [facility] but ended up leaving without being seen due to the long wait.  Patient ended up going to [place] instead for evaluation.  Upon evaluation of the [facility] department, x-ray imaging revealed a commuted fracture of the distal radius and ulna with extensive gas in the soft tissues likely from associated laceration.  Tdap was administered.  Ancef was additionally administered.  EDP at the time discussed the case with Dr. Fredna Dow with orthopedic surgery who recommended operative intervention upon arrival to [facility].     Review of Systems: Review of Systems  Constitutional:  Positive for malaise/fatigue.  Musculoskeletal:  Positive for myalgias.  All other systems reviewed and are negative.    Past Medical History:  Diagnosis Date   Allergy    "THINK I GOT THEM"   Anxiety    Arthritis    "BODY FULL OF  ARTHRITIS"   Asthma    Bronchitis    Cancer (Success)    "RIGHT LEG SQUAMOUS CELL"   Closed fracture of left distal radius 10/25/2018   COPD (chronic obstructive pulmonary disease) (HCC)    Depression    GERD (gastroesophageal reflux disease)    Headache    Hypercholesteremia    Hypertension    IBS (irritable bowel syndrome)    Sleep apnea    Tobacco abuse    Vitamin D deficiency     Past Surgical History:  Procedure Laterality Date   APPENDECTOMY     COLONOSCOPY  08/26/2015   Colonic polyps status post polypectomy. Minimal sigmoid diverticulosis.    ESOPHAGOGASTRODUODENOSCOPY  02/25/2011   Large hiatal hernia otherwise normal EGD.   OPEN REDUCTION INTERNAL FIXATION (ORIF) DISTAL RADIAL FRACTURE Left 10/25/2018   Procedure: OPEN REDUCTION INTERNAL FIXATION (ORIF)LEFT  DISTAL RADIAL FRACTURE;  Surgeon: Marchia Bond, MD;  Location: [facility];  Service: Orthopedics;  Laterality: Left;   POLYPECTOMY     VAGINAL HYSTERECTOMY      Social History:  reports that she quit smoking about 2 years ago. Her smoking use included cigarettes. She has a 44.00 pack-year smoking history. She has been exposed to tobacco smoke. She has never used smokeless tobacco. She reports current alcohol use. She reports current drug use. Drug: Marijuana.  Allergies  Allergen Reactions   Penicillins Hives and Shortness Of Breath   Lisinopril Other (See Comments)    Makes feel bad   Lovastatin Other (See Comments)    Cramps in legs and feet   Other  Other (See Comments)    Fragrances in soaps, perfumes, shampoos, conditioners    Family History  Problem Relation Age of Onset   CAD Mother 12   Alzheimer's disease Father 45   Diabetes Father    Colon cancer Neg Hx    Esophageal cancer Neg Hx    Colon polyps Neg Hx    Rectal cancer Neg Hx    Stomach cancer Neg Hx     Prior to Admission medications   Medication Sig Start Date End Date Taking? Authorizing Provider  acyclovir (ZOVIRAX)  200 MG capsule Take 200 mg by mouth every morning. 08/17/21   [provider]  albuterol (PROVENTIL) (2.5 MG/3ML) 0.083% nebulizer solution Take 3 mLs (2.5 mg total) by nebulization every 6 (six) hours as needed for wheezing or shortness of breath. 08/27/21   Laurin Coder, MD  albuterol (VENTOLIN HFA) 108 (90 Base) MCG/ACT inhaler Inhale 2 puffs into the lungs every 6 (six) hours as needed for wheezing. 10/15/21   Dorna Mai, MD  buPROPion (WELLBUTRIN XL) 300 MG 24 hr tablet Take 1 tablet by mouth once daily 06/02/22   Libby Maw, MD  celecoxib (CELEBREX) 200 MG capsule Take 1 capsule (200 mg total) by mouth daily as needed. 05/03/22   Libby Maw, MD  Cyanocobalamin (VITAMIN B 12 PO) Take 1 tablet by mouth daily.    [provider]  fluticasone Asencion Islam) 50 MCG/ACT nasal spray Place 2 sprays into each nostril once a day 05/03/22   Libby Maw, MD  Fluticasone-Umeclidin-Vilant (TRELEGY ELLIPTA) 100-62.5-25 MCG/ACT AEPB Inhale 1 puff into the lungs daily. INHALE 1 PUFF ONCE DAILY 01/10/22   Sherrilyn Rist A, MD  gabapentin (NEURONTIN) 300 MG capsule TAKE 1 CAPSULE BY MOUTH IN THE MORNING AND 1 CAPSULE IN THE AFTERNOON AND 2 CAPSULES AT BEDTIME 03/24/22   Libby Maw, MD  hydrocortisone (ANUSOL-HC) 2.5 % rectal cream Insert and apply rectally 2 times a day for 10 days 08/31/21     losartan (COZAAR) 100 MG tablet Take 1 tablet by mouth once daily 06/02/22   Libby Maw, MD  montelukast (SINGULAIR) 10 MG tablet Take 1 tablet (10 mg total) by mouth at bedtime. 04/04/22   Libby Maw, MD  Multiple Vitamin (MULTIVITAMIN WITH MINERALS) TABS tablet Take 1 tablet by mouth daily.    [provider]  Omega-3 Fatty Acids (FISH OIL) 1000 MG CPDR Take 1,000 mg by mouth daily.    [provider]  omeprazole (PRILOSEC) 40 MG capsule Take 1 capsule (40 mg total) by mouth daily. 05/03/22   Libby Maw,  MD  polyethylene glycol powder [facility]) 17 GM/SCOOP powder Dissolve 1 scoop of medication in water daily as needed for constipation. 12/22/21   Libby Maw, MD  QUEtiapine (SEROQUEL) 25 MG tablet Take 1 tablet (25 mg total) by mouth at bedtime. 03/24/22   Libby Maw, MD  rosuvastatin (CRESTOR) 40 MG tablet TAKE 1/2 (ONE-HALF) TABLET BY MOUTH AT BEDTIME Strength: 40 mg 04/04/22   Libby Maw, MD  sertraline (ZOLOFT) 100 MG tablet Take 2 tablets by mouth once daily 04/20/22   Libby Maw, MD    Physical Exam:  Vitals:   06/17/22 0515 06/17/22 0536 06/17/22 0826 06/17/22 1634  BP: (!) 145/68 (!) 148/59 (!) 105/59 (!) 155/64  Pulse: 70 70 65 67  Resp: '12 16 15 15  '$ Temp: 98 F (36.7 C) 98.2 F (36.8 C) 98.3 F (36.8 C) 98.8 F (  37.1 C)  TempSrc:  Oral Oral Oral  SpO2: 95% 96% 97% 96%    Constitutional: Awake alert and oriented x3, in distress due to pain of the right forearm. Skin: Dressing of right forearm clean dry and intact.  No rashes, no lesions, good skin turgor noted. Eyes: Pupils are equally reactive to light.  No evidence of scleral icterus or conjunctival pallor.  ENMT: Moist mucous membranes noted.  Posterior pharynx clear of any exudate or lesions.   Neck: normal, supple, no masses, no thyromegaly.  No evidence of jugular venous distension.   Respiratory: clear to auscultation bilaterally, no wheezing, no crackles. Normal respiratory effort. No accessory muscle use.  Cardiovascular: Regular rate and rhythm, no murmurs / rubs / gallops. No extremity edema. 2+ pedal pulses. No carotid bruits.  Chest:   Nontender without crepitus or deformity.   Back:   Nontender without crepitus or deformity. Abdomen: Abdomen is soft and nontender.  No evidence of intra-abdominal masses.  Positive bowel sounds noted in all quadrants.   Musculoskeletal: Pain with both passive and active range of motion of the digits of the right hand.   no  contractures. Normal muscle tone.  Neurologic: CN 2-12 grossly intact. Sensation intact.  Patient moving all 4 extremities spontaneously.  Patient is following all commands.  Patient is responsive to verbal stimuli.   Psychiatric: Patient exhibits normal mood with appropriate affect.  Patient seems to possess insight as to their current situation.    Data Reviewed:  I have personally reviewed and interpreted labs, imaging.  Significant findings are   Lab Results  Component Value Date   WBC 9.1 06/17/2022   HGB 13.4 06/17/2022   HCT 40.5 06/17/2022   MCV 96.4 06/17/2022   PLT 179 06/17/2022   Lab Results  Component Value Date   K 4.4 06/17/2022   Lab Results  Component Value Date   BUN 11 06/17/2022   Lab Results  Component Value Date   CREATININE 0.88 06/17/2022    Wrist and Forearm Xrays:     IMPRESSION: 1. Comminuted fracture distal radius with mild displacement. 2. Fracture distal ulnar diaphysis and ulnar styloid. 3. Extensive gas in the soft tissues from laceration.   Assessment and Plan: * Fracture of radial shaft, with ulna, right, open Status post irrigation and debridement of the right forearm wound by Dr. Fredna Dow with orthopedic surgery AM of 12/29, their assistance is appreciated Dr. Fredna Dow recommends at least 24 hours of IV antibiotics with observation postoperatively which I wholeheartedly agree with Per Dr. Fredna Dow no evidence of gross infection in surgery Per current literature, treating with intravenous ceftriaxone and Flagyl Patient not exhibiting any SIRS criteria or evidence of sepsis Monitoring labs, blood cultures and monitoring for clinical evidence of infection If patient looks well on 12/30 will consider transition to oral antibiotics and discharged with outpatient orthopedic follow-up.  Open wound of right forearm due to dog bite Please see assessment and plan above  COPD with asthma No evidence of exacerbation this time Continue home regimen  of maintenance inhalers As needed bronchodilator therapy for episodic shortness of breath and wheezing.   Essential hypertension Resume patients home regimen of oral antihypertensives Titrate antihypertensive regimen as necessary to achieve adequate BP control PRN intravenous antihypertensives for excessively elevated blood pressure   Depression with anxiety Continue home regimen of psychotropic medications  Mixed hyperlipidemia Continuing home regimen of lipid lowering therapy.   GERD without esophagitis Continuing home regimen of daily PPI therapy.   OSA (  obstructive sleep apnea) Reports not using CPAP at home Supplemental oxygen as needed with sleep.    Code Status:  Full code  code status decision has been confirmed with: patient Family Communication: deferred   Consults: Dr. Fredna Dow with Orthopedic surgery  Severity of Illness:  The appropriate patient status for this patient is OBSERVATION. Observation status is judged to be reasonable and necessary in order to provide the required intensity of service to ensure the patient's safety. The patient's presenting symptoms, physical exam findings, and initial radiographic and laboratory data in the context of their medical condition is felt to place them at decreased risk for further clinical deterioration. Furthermore, it is anticipated that the patient will be medically stable for discharge from the hospital within 2 midnights of admission.   Author:  Vernelle Emerald MD  06/17/2022 7:54 PM

## 2022-06-17 NOTE — Assessment & Plan Note (Signed)
.   Continuing home regimen of lipid lowering therapy.  

## 2022-06-17 NOTE — Op Note (Signed)
NAME: Tiffany Murphy MEDICAL RECORD NO: [id number] DATE OF BIRTH: Nov 13, 1950 FACILITY: [facility] LOCATION: [facility] OR PHYSICIAN: Tennis Must, MD   OPERATIVE REPORT   DATE OF PROCEDURE: 06/17/22    PREOPERATIVE DIAGNOSIS: Right distal radius and ulna fractures and dog bites   POSTOPERATIVE DIAGNOSIS: Right open distal radius and ulna fracture and dog bite   PROCEDURE: Irrigation and debridement of right open distal radius and ulna fractures and dog bites   SURGEON:  Leanora Cover, M.D.   ASSISTANT: none   ANESTHESIA:  General   INTRAVENOUS FLUIDS:  Per anesthesia flow sheet.   ESTIMATED BLOOD LOSS:  Minimal.   COMPLICATIONS:  None.   SPECIMENS:  none   TOURNIQUET TIME:    Total Tourniquet Time Documented: Upper Arm (Right) - 25 minutes Total: Upper Arm (Right) - 25 minutes    DISPOSITION:  Stable to PACU.   INDICATIONS: 71 year old female present with her husband.  She states she was breaking up a fight between her dogs yesterday evening when she was knocked over injuring her right wrist and bitten on her arm.  She was seen at med center droppage where radiographs were taken revealing distal radius and ulna fractures.  She had wounds on dorsum of the wrist and at the ulnar side of the wrist.  These were concerning for open fracture.  She was transferred to [facility] for further care.  Recommended irrigation and debridement in the operating room with open reduction internal fixation as appropriate.  Risks, benefits and alternatives of surgery were discussed including the risks of blood loss, infection, damage to nerves, vessels, tendons, ligaments, bone for surgery, need for additional surgery, complications with wound healing, continued pain, stiffness, , need for repeat irrigation and debridement.  She voiced understanding of these risks and elected to proceed.  OPERATIVE COURSE:  After being identified preoperatively by myself,  the patient and I agreed on the procedure and site  of the procedure.  The surgical site was marked.  Surgical consent had been signed. Preoperative IV antibiotic prophylaxis was given. She was transferred to the operating room and placed on the operating table in supine position with the Right upper extremity on an arm board.  General anesthesia was induced by the anesthesiologist.  Right upper extremity was prepped and draped in normal sterile orthopedic fashion.  A surgical pause was performed between the surgeons, anesthesia, and operating room staff and all were in agreement as to the patient, procedure, and site of procedure.  Tourniquet at the proximal aspect of the extremity was inflated to 250 mmHg after exsanguination of the arm with an Esmarch bandage.  The wounds were explored.  There were probed with a freer elevator.  There were 3 smaller wounds at the ulnar side of the wrist.  The distalmost was into the subcutaneous tissues.  The tube just more proximal over the ulnar border deeper and the ulnar fracture can be felt with a freer elevator through the wounds.  The dorsal wound was extended proximally to allow better irrigation and debridement.  The dorsal wound was explored.  There was no gross contamination.  The wound communicated with the distal radius fracture.  The wounds were all copiously irrigated with sterile saline by cystoscopy tubing.  Some devitalized fat was removed with the pickups.  The wounds were all then packed with quarter inch iodoform gauze.  They were injected with quarter percent plain Marcaine in postoperative analgesia.  They were dressed with sterile 4 x 4's and wrapped  with a Kerlix bandage.  A sugar-tong splint was placed and wrapped with Kerlix and Ace bandage.  She was given IV ceftriaxone and doxycycline in the operating room.  The tourniquet was deflated at 25 minutes.  Fingertips were pink with brisk capillary refill after deflation of tourniquet.  The operative  drapes were broken down.  The patient was awoken from  anesthesia safely.  She was transferred back to the stretcher and taken to PACU in stable condition.  I will see her back in the office in 3-4 days for postoperative followup.  Will keep her overnight for IV antibiotics and transition to oral antibiotics.   Leanora Cover, MD Electronically signed, 06/17/22

## 2022-06-17 NOTE — H&P (Signed)
Tiffany Murphy is an 71 y.o. female.   Chief Complaint: Right wrist fracture and dog bite HPI: 71 year old female present with her husband.  She states yesterday evening her dogs got in a fight.  She was trying to break it up she was knocked over onto her right arm and then her right arm was bitten by the dog.  [facility] where [facility]ographs were taken revealing distal radius and ulna fractures.  She was also noted to have laceration over the fracture site concerning for open fracture.  She was transferred to [facility] for further care.  Allergies:  Allergies  Allergen Reactions   Penicillins Hives and Shortness Of Breath   Lisinopril Other (See Comments)    Makes feel bad   Lovastatin Other (See Comments)    Cramps in legs and feet   Other Other (See Comments)    Fragrances in soaps, perfumes, shampoos, conditioners    Past Medical History:  Diagnosis Date   Allergy    "THINK I GOT THEM"   Anxiety    Arthritis    "BODY FULL OF ARTHRITIS"   Asthma    Bronchitis    Cancer ([place])    "RIGHT LEG SQUAMOUS CELL"   Closed fracture of left distal radius 10/25/2018   COPD (chronic obstructive pulmonary disease) (HCC)    Depression    GERD (gastroesophageal reflux disease)    Headache    Hypercholesteremia    Hypertension    IBS (irritable bowel syndrome)    Sleep apnea    Tobacco abuse    Vitamin D deficiency     Past Surgical History:  Procedure Laterality Date   APPENDECTOMY     COLONOSCOPY  08/26/2015   Colonic polyps status post polypectomy. Minimal sigmoid diverticulosis.    ESOPHAGOGASTRODUODENOSCOPY  02/25/2011   Large hiatal hernia otherwise normal EGD.   OPEN REDUCTION INTERNAL FIXATION (ORIF) DISTAL RADIAL FRACTURE Left 10/25/2018   Procedure: OPEN REDUCTION INTERNAL FIXATION (ORIF)LEFT  DISTAL RADIAL FRACTURE;  Surgeon: Marchia Bond, MD;  Location: [facility];  Service: Orthopedics;  Laterality: Left;   POLYPECTOMY     VAGINAL  HYSTERECTOMY      Family History: Family History  Problem Relation Age of Onset   CAD Mother 44   Alzheimer's disease Father 49   Diabetes Father    Colon cancer Neg Hx    Esophageal cancer Neg Hx    Colon polyps Neg Hx    Rectal cancer Neg Hx    Stomach cancer Neg Hx     Social History:   reports that she quit smoking about 2 years ago. Her smoking use included cigarettes. She has a 44.00 pack-year smoking history. She has been exposed to tobacco smoke. She has never used smokeless tobacco. She reports current alcohol use. She reports current drug use. Drug: Marijuana.  Medications: (Not in a hospital admission)   Results for orders placed or performed during the hospital encounter of 06/16/22 (from the past 48 hour(s))  CBC     Status: None   Collection Time: 06/16/22  1:44 PM  Result Value Ref Range   WBC 7.0 4.0 - 10.5 K/uL   RBC 4.34 3.87 - 5.11 MIL/uL   Hemoglobin 13.6 12.0 - 15.0 g/dL   HCT 42.6 36.0 - 46.0 %   MCV 98.2 80.0 - 100.0 fL   MCH 31.3 26.0 - 34.0 pg   MCHC 31.9 30.0 - 36.0 g/dL   RDW 14.4 11.5 - 15.5 %  Platelets 201 150 - 400 K/uL   nRBC 0.0 0.0 - 0.2 %    Comment: Performed at [facility], [place] [address] [place], [place] [address]  Basic metabolic panel     Status: Abnormal   Collection Time: 06/16/22  1:44 PM  Result Value Ref Range   Sodium 140 135 - 145 mmol/L   Potassium 3.9 3.5 - 5.1 mmol/L   Chloride 108 98 - 111 mmol/L   CO2 25 22 - 32 mmol/L   Glucose, Bld 191 (H) 70 - 99 mg/dL    Comment: Glucose reference range applies only to samples taken after fasting for at least 8 hours.   BUN 14 8 - 23 mg/dL   Creatinine, Ser 0.96 0.44 - 1.00 mg/dL   Calcium 8.7 (L) 8.9 - 10.3 mg/dL   GFR, Estimated >60 >60 mL/min    Comment: (NOTE) Calculated using the CKD-EPI Creatinine Equation (2021)    Anion gap 7 5 - 15    Comment: Performed at [facility] [address] [place], [place] [address]    DG MINI C-ARM IMAGE  ONLY  Result Date: 06/17/2022 There is no interpretation for this exam.  This order is for images obtained during a surgical procedure.  Please See "Surgeries" Tab for more information regarding the procedure.   CT Maxillofacial Wo Contrast  Result Date: 06/16/2022 CLINICAL DATA:  Frequent falls, facial bruising. EXAM: CT HEAD WITHOUT CONTRAST CT MAXILLOFACIAL WITHOUT CONTRAST TECHNIQUE: Multidetector CT imaging of the head and maxillofacial structures were performed using the standard protocol without intravenous contrast. Multiplanar CT image reconstructions of the maxillofacial structures were also generated. RADIATION DOSE REDUCTION: This exam was performed according to the departmental dose-optimization program which includes automated exposure control, adjustment of the mA and/or kV according to patient size and/or use of iterative reconstruction technique. COMPARISON:  CT head 04/22/2020 and brain MRI 02/22/2020 FINDINGS: CT HEAD FINDINGS Brain: There is no acute intracranial hemorrhage, extra-axial fluid collection, or acute infarct. Mild background parenchymal volume loss with prominence of the ventricular system and extra-axial CSF spaces is stable. The ventricles are stable in size since 2021. 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 There is no mass lesion.  There is no mass effect or midline shift. Vascular: There is calcification of the bilateral carotid siphons. Skull: Normal. Negative for fracture or focal lesion. Other: There is mild right forehead swelling. CT MAXILLOFACIAL FINDINGS Osseous: There is no acute facial bone fracture. There is no evidence of mandibular dislocation. There is no suspicious osseous lesion. Orbits: The globes and orbits are unremarkable. There is no retrobulbar hematoma. Sinuses: Clear. Soft tissues: Unremarkable. IMPRESSION: 1. No acute intracranial pathology. 2. Mild right forehead swelling  without underlying calvarial or facial bone fracture. Electronically Signed   By: Valetta Mole M.D.   On: 06/16/2022 12:51   CT Head Wo Contrast  Result Date: 06/16/2022 CLINICAL DATA:  Frequent falls, facial bruising. EXAM: CT HEAD WITHOUT CONTRAST CT MAXILLOFACIAL WITHOUT CONTRAST TECHNIQUE: Multidetector CT imaging of the head and maxillofacial structures were performed using the standard protocol without intravenous contrast. Multiplanar CT image reconstructions of the maxillofacial structures were also generated. RADIATION DOSE REDUCTION: This exam was performed according to the departmental dose-optimization program which includes automated exposure control, adjustment of the mA and/or kV according to patient size and/or use of iterative reconstruction technique. COMPARISON:  CT head 04/22/2020 and brain MRI 02/22/2020 FINDINGS: CT HEAD  FINDINGS Brain: There is no acute intracranial hemorrhage, extra-axial fluid collection, or acute infarct. Mild background parenchymal volume loss with prominence of the ventricular system and extra-axial CSF spaces is stable. The ventricles are stable in size since 2021. 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 There is no mass lesion.  There is no mass effect or midline shift. Vascular: There is calcification of the bilateral carotid siphons. Skull: Normal. Negative for fracture or focal lesion. Other: There is mild right forehead swelling. CT MAXILLOFACIAL FINDINGS Osseous: There is no acute facial bone fracture. There is no evidence of mandibular dislocation. There is no suspicious osseous lesion. Orbits: The globes and orbits are unremarkable. There is no retrobulbar hematoma. Sinuses: Clear. Soft tissues: Unremarkable. IMPRESSION: 1. No acute intracranial pathology. 2. Mild right forehead swelling without underlying calvarial or facial bone fracture. Electronically Signed   By: Valetta Mole M.D.    On: 06/16/2022 12:51   DG Forearm Right  Result Date: 06/16/2022 CLINICAL DATA:  Dog bite EXAM: RIGHT FOREARM - 2 VIEW COMPARISON:  Right wrist same day FINDINGS: Comminuted fracture distal radius and ulna. Fracture ulnar styloid. No fracture of the proximal forearm. Negative elbow. Extensive gas in the soft tissues around the wrist due to laceration. IMPRESSION: Comminuted fracture distal radius and ulna. Fracture ulnar styloid. Extensive gas in the soft tissues. Electronically Signed   By: Franchot Gallo M.D.   On: 06/16/2022 11:39   DG Wrist Complete Right  Result Date: 06/16/2022 CLINICAL DATA:  Dog bite EXAM: RIGHT WRIST - COMPLETE 3+ VIEW COMPARISON:  None Available. FINDINGS: Comminuted fracture distal radius with mild displacement. Fracture distal ulnar diaphysis and ulnar styloid. Extensive gas in the soft tissues due to laceration. No carpal bone fracture. IMPRESSION: 1. Comminuted fracture distal radius with mild displacement. 2. Fracture distal ulnar diaphysis and ulnar styloid. 3. Extensive gas in the soft tissues from laceration. Electronically Signed   By: Franchot Gallo M.D.   On: 06/16/2022 11:38      Blood pressure (!) 140/69, pulse 74, temperature (!) 100.9 F (38.3 C), temperature source Oral, resp. rate (!) 22, SpO2 94 %.  General appearance: alert, cooperative, and appears stated age Head: Normocephalic, without obvious abnormality, atraumatic Neck: supple, symmetrical, trachea midline Extremities: Intact capillary refill all fingertips.  Can flex and extend the IP joint of the thumb and likely cross her fingers.  She is able to feel me touching her fingers but states they feel little bit tingly.  She is in a Coban dressing.  Nontender at the elbow.  Per chart she has wounds on the dorsal forearm. Neurologic: Grossly normal  Assessment/Plan Right forearm dog bite wounds and distal radius and ulna fractures.  We discussed the nature of the injury.  I recommended operative  irrigation and debridement of the wounds.  If the wounds communicate with the fracture of the wound with irrigated.  The fracture is well.  We discussed that it this is clearly not an open fracture that we may clean the wounds and perform open reduction internal fixation of the distal radius fracture.  The distal ulna fracture can often be left without fixation and healed well.  If there is any concern however that the wounds communicate with the fracture site would be reticent to proceed with open reduction internal fixation due to the contaminated nature of the wounds from a dog bite.  If this is the case then we may simply irrigate  and debride the wounds and fracture site and perform fixation of the distal radius at a later date or possibly treated nonoperatively altogether.  She is comfortable with the plan of care.  Risks, benefits and alternatives of surgery were discussed including risks of blood loss, infection, damage to nerves/vessels/tendons/ligament/bone, failure of surgery, need for additional surgery, complication with wound healing, stiffness, need for repeat irrigation and debridement.  She voiced understanding of these risks and elected to proceed.    Leanora Cover 06/17/2022, 2:49 AM

## 2022-06-17 NOTE — Progress Notes (Addendum)
Postop note: Status post irrigation debridement of right forearm dog bite wounds and open distal radius and ulna fractures.  No gross contamination.  All wounds packed.  Recommend 24 hours IV antibiotics and discharged home on oral antibiotics.  Follow-up in the office early next week.  Okay for DVT prophylaxis.

## 2022-06-17 NOTE — Assessment & Plan Note (Signed)
Reports not using CPAP at home Supplemental oxygen as needed with sleep.

## 2022-06-17 NOTE — Assessment & Plan Note (Signed)
.   Resume patients home regimen of oral antihypertensives . Titrate antihypertensive regimen as necessary to achieve adequate BP control . PRN intravenous antihypertensives for excessively elevated blood pressure   

## 2022-06-17 NOTE — Hospital Course (Signed)
71 year old female with past medical history of COPD and Asthma,  hypertension, gastroesophageal reflux disease, hyperlipidemia, major depressive disorder, anxiety disorder, obstructive sleep apnea former history of smoking who originally presented to the [facility] emergency department after experiencing a dog bite to the right forearm.  Patient explains that she was attempting to break up a fight between 2 dogs.  1 ended up biting her on the right forearm as a result.  This bite wound/laceration was immediately associated with severe pain, sharp in quality, radiating proximally, worse with the affected extremity and associated with ongoing bleeding.  She complains of associated swelling to the arm in the hours that followed.  Patient initially presented to [facility] but ended up leaving without being seen due to the long wait.  Patient ended up going to [facility] instead for evaluation.  Upon evaluation of the [facility] emergency department, x-ray imaging revealed a commuted fracture of the distal radius and ulna with extensive gas in the soft tissues likely from associated laceration.  Tdap was administered.  Ancef was additionally administered.  EDP at the time discussed the case with Dr. Fredna Dow with orthopedic surgery who recommended operative intervention upon arrival to [facility].

## 2022-06-17 NOTE — Assessment & Plan Note (Signed)
Continue home regimen of psychotropic medications

## 2022-06-17 NOTE — Assessment & Plan Note (Signed)
No evidence of exacerbation this time Continue home regimen of maintenance inhalers As needed bronchodilator therapy for episodic shortness of breath and wheezing.

## 2022-06-17 NOTE — Anesthesia Procedure Notes (Signed)
Procedure Name: Intubation Date/Time: 06/17/2022 3:09 AM  Performed by: [place] Warriner T, CRNAPre-anesthesia Checklist: Patient identified, Emergency Drugs available, Suction available and Patient being monitored Patient Re-evaluated:Patient Re-evaluated prior to induction Oxygen Delivery Method: Circle system utilized Preoxygenation: Pre-oxygenation with 100% oxygen Induction Type: IV induction, Rapid sequence and Cricoid Pressure applied Ventilation: Mask ventilation without difficulty Laryngoscope Size: Mac and 3 Grade View: Grade I Tube type: Oral Tube size: 7.0 mm Number of attempts: 1 Airway Equipment and Method: Stylet and Oral airway Placement Confirmation: ETT inserted through vocal cords under direct vision, positive ETCO2 and breath sounds checked- equal and bilateral Secured at: 21 cm Tube secured with: Tape Dental Injury: Teeth and Oropharynx as per pre-operative assessment

## 2022-06-17 NOTE — ED Provider Notes (Signed)
Blood pressure 133/87, pulse 65, temperature 98 F (36.7 C), temperature source Oral, resp. rate (!) 21, SpO2 97 %.  In short, Tiffany Murphy is a 71 y.o. female with a chief complaint of Animal Bite .  Refer to the original H&P for additional details.  12:56 AM  Patient arrives to the outside emergency department with open fracture related to a dog bite.  She was given Ancef and tetanus at the outside ED.  Will page Dr. Fredna Dow to make him aware the patient has arrived.  Plan for additional pain medication and will keep the patient n.p.o.  01:05 AM Dr. Fredna Dow in with a case currently but will see patient in the ED afterwards.    Margette Fast, MD 06/17/22 [phone]

## 2022-06-18 ENCOUNTER — Encounter (HOSPITAL_COMMUNITY): Payer: Self-pay | Admitting: Orthopedic Surgery

## 2022-06-18 DIAGNOSIS — S52201B Unspecified fracture of shaft of right ulna, initial encounter for open fracture type I or II: Secondary | ICD-10-CM | POA: Diagnosis not present

## 2022-06-18 DIAGNOSIS — S52301B Unspecified fracture of shaft of right radius, initial encounter for open fracture type I or II: Secondary | ICD-10-CM | POA: Diagnosis not present

## 2022-06-18 LAB — COMPREHENSIVE METABOLIC PANEL
ALT: 16 U/L (ref 0–44)
AST: 14 U/L — ABNORMAL LOW (ref 15–41)
Albumin: 3.1 g/dL — ABNORMAL LOW (ref 3.5–5.0)
Alkaline Phosphatase: 45 U/L (ref 38–126)
Anion gap: 9 (ref 5–15)
BUN: 13 mg/dL (ref 8–23)
CO2: 27 mmol/L (ref 22–32)
Calcium: 8.4 mg/dL — ABNORMAL LOW (ref 8.9–10.3)
Chloride: 103 mmol/L (ref 98–111)
Creatinine, Ser: 0.76 mg/dL (ref 0.44–1.00)
GFR, Estimated: >60 mL/min (ref >60)
Glucose, Bld: 112 mg/dL — ABNORMAL HIGH (ref 70–99)
Potassium: 3.8 mmol/L (ref 3.5–5.1)
Sodium: 139 mmol/L (ref 135–145)
Total Bilirubin: 0.9 mg/dL (ref 0.3–1.2)
Total Protein: 5.7 g/dL — ABNORMAL LOW (ref 6.5–8.1)

## 2022-06-18 LAB — CBC
HCT: 37.3 % (ref 36.0–46.0)
Hemoglobin: 11.9 g/dL — ABNORMAL LOW (ref 12.0–15.0)
MCH: 31.4 pg (ref 26.0–34.0)
MCHC: 31.9 g/dL (ref 30.0–36.0)
MCV: 98.4 fL (ref 80.0–100.0)
Platelets: 168 10*3/uL (ref 150–400)
RBC: 3.79 MIL/uL — ABNORMAL LOW (ref 3.87–5.11)
RDW: 14.4 % (ref 11.5–15.5)
WBC: 9 10*3/uL (ref 4.0–10.5)
nRBC: 0 % (ref 0.0–0.2)

## 2022-06-18 LAB — HEMOGLOBIN A1C
Hgb A1c MFr Bld: 6.1 % — ABNORMAL HIGH (ref 4.8–5.6)
Mean Plasma Glucose: 128 mg/dL

## 2022-06-18 MED ORDER — CLINDAMYCIN HCL 300 MG PO CAPS: 300.0000 mg | ORAL_CAPSULE | Freq: Three times a day (TID) | ORAL | Status: DC

## 2022-06-18 MED ORDER — DOXYCYCLINE HYCLATE 100 MG PO TBEC: 100.0000 mg | DELAYED_RELEASE_TABLET | Freq: Two times a day (BID) | ORAL | 0 refills | Status: DC

## 2022-06-18 MED ORDER — OXYCODONE-ACETAMINOPHEN 5-325 MG PO TABS: 1.0000 | ORAL_TABLET | ORAL | 0 refills | Status: DC | PRN

## 2022-06-18 MED ORDER — METRONIDAZOLE 500 MG PO TABS: 500.0000 mg | ORAL_TABLET | Freq: Three times a day (TID) | ORAL | 0 refills | Status: AC

## 2022-06-18 MED ORDER — CEPHALEXIN 500 MG PO CAPS: 500.0000 mg | ORAL_CAPSULE | Freq: Four times a day (QID) | ORAL | 0 refills | Status: DC

## 2022-06-18 NOTE — Discharge Summary (Signed)
Physician Discharge Summary  Tiffany Murphy ASN:[id number] DOB: 1950-08-11 DOA: 06/16/2022  PCP: Libby Maw, MD  Admit date: 06/16/2022 Discharge date: 06/18/2022  Admitted From: Home Disposition:  Home  Discharge Condition:Stable CODE STATUS:FULL Diet recommendation: Heart Healthy  Brief/Interim Summary:  71 year old female with past medical history of COPD and Asthma,  hypertension, gastroesophageal reflux disease, hyperlipidemia, major depressive disorder, anxiety disorder, obstructive sleep apnea former history of smoking who originally presented to the [facility] emergency department after experiencing a dog bite to the right forearm. Patient explains that she was attempting to break up a fight between 2 dogs. 1 ended up biting her on the right forearm as a result.  This bite wound/laceration was immediately associated with severe pain, sharp in quality, radiating proximally, worse with the affected extremity and associated with ongoing bleeding.x-ray imaging revealed a commuted fracture of the distal radius and ulna with extensive gas in the soft tissues likely from associated laceration. Tdap was administered. Ancef was additionally administered. EDP at the time discussed the case with Dr. Fredna Dow with orthopedic surgery who recommended operative intervention upon arrival to [facility].  Patient underwent irrigation and debridement of right open distal radius and ulna fractures , dog bites.  Right forearm wrapped with dressing.  Orthopedics cleared for discharge home today with oral antibiotics and follow-up with Dr. Morene Rankins early next week.  Patient seen by PT and OT this morning and did not recommend any follow-up.  Medically stable for discharge home today. She will be discharged on 1 week course of doxycycline and Flagyl.  She will continue her other home medications.  I discussed the discharge plan with the husband today before dsicharge.    Discharge Diagnoses:   Principal Problem:   Fracture of radial shaft, with ulna, right, open Active Problems:   Open wound of right forearm due to dog bite   COPD with asthma   Essential hypertension   Depression with anxiety   Mixed hyperlipidemia   GERD without esophagitis   OSA (obstructive sleep apnea)   Fracture of radial shaft, with ulna, left, open    Discharge Instructions  Discharge Instructions     Diet - low sodium heart healthy   Complete by: As directed    Discharge instructions   Complete by: As directed    1)Please take prescribed medications as instructed 2)Follow up with Dr. Fredna Dow, orthopedics, early next week.  Name and number of the provider has been attached   Increase activity slowly   Complete by: As directed       Allergies as of 06/18/2022       Reactions   Penicillins Hives, Shortness Of Breath   Lisinopril Other (See Comments)   Makes feel bad   Lovastatin Other (See Comments)   Cramps in legs and feet   Other Other (See Comments)   Fragrances in soaps, perfumes, shampoos, conditioners        Medication List     TAKE these medications    acetaminophen 650 MG CR tablet Commonly known as: TYLENOL Take 1,300 mg by mouth every 8 (eight) hours as needed for pain.   acyclovir 200 MG capsule Commonly known as: ZOVIRAX Take 200 mg by mouth daily as needed (for break out).   albuterol (2.5 MG/3ML) 0.083% nebulizer solution Commonly known as: PROVENTIL Take 3 mLs (2.5 mg total) by nebulization every 6 (six) hours as needed for wheezing or shortness of breath.   albuterol 108 (90 Base) MCG/ACT inhaler Commonly known as:  VENTOLIN HFA Inhale 2 puffs into the lungs every 6 (six) hours as needed for wheezing.   buPROPion 300 MG 24 hr tablet Commonly known as: WELLBUTRIN XL Take 1 tablet by mouth once daily   celecoxib 200 MG capsule Commonly known as: CELEBREX Take 1 capsule (200 mg total) by mouth daily as needed. What changed: reasons to take this    cephALEXin 500 MG capsule Commonly known as: KEFLEX Take 1 capsule (500 mg total) by mouth 4 (four) times daily for 7 days.   doxycycline 100 MG EC tablet Commonly known as: DORYX Take 1 tablet (100 mg total) by mouth 2 (two) times daily for 7 days. Please cancel the earlier Rx for keflex   Fish Oil 1000 MG Cpdr Take 1,000 mg by mouth daily.   fluticasone 50 MCG/ACT nasal spray Commonly known as: FLONASE Place 2 sprays into each nostril once a day   gabapentin 300 MG capsule Commonly known as: NEURONTIN TAKE 1 CAPSULE BY MOUTH IN THE MORNING AND 1 CAPSULE IN THE AFTERNOON AND 2 CAPSULES AT BEDTIME What changed:  how much to take how to take this when to take this additional instructions   hydrocortisone 2.5 % rectal cream Commonly known as: ANUSOL-HC Insert and apply rectally 2 times a day for 10 days What changed:  how much to take how to take this when to take this reasons to take this   losartan 100 MG tablet Commonly known as: COZAAR Take 1 tablet by mouth once daily   metroNIDAZOLE 500 MG tablet Commonly known as: Flagyl Take 1 tablet (500 mg total) by mouth 3 (three) times daily for 7 days.   montelukast 10 MG tablet Commonly known as: SINGULAIR Take 1 tablet (10 mg total) by mouth at bedtime.   multivitamin with minerals Tabs tablet Take 1 tablet by mouth daily.   omeprazole 40 MG capsule Commonly known as: PRILOSEC Take 1 capsule (40 mg total) by mouth daily.   oxyCODONE-acetaminophen 5-325 MG tablet Commonly known as: PERCOCET/ROXICET Take 1 tablet by mouth every 4 (four) hours as needed for moderate pain.   polyethylene glycol powder 17 GM/SCOOP powder Commonly known as: GLYCOLAX/MIRALAX Dissolve 1 scoop of medication in water daily as needed for constipation.   QUEtiapine 25 MG tablet Commonly known as: SEROquel Take 1 tablet (25 mg total) by mouth at bedtime.   rosuvastatin 40 MG tablet Commonly known as: CRESTOR TAKE 1/2 (ONE-HALF)  TABLET BY MOUTH AT BEDTIME Strength: 40 mg   sertraline 100 MG tablet Commonly known as: ZOLOFT Take 2 tablets by mouth once daily   Trelegy Ellipta 100-62.5-25 MCG/ACT Aepb Generic drug: Fluticasone-Umeclidin-Vilant Inhale 1 puff into the lungs daily. INHALE 1 PUFF ONCE DAILY   VITAMIN B 12 PO Take 1 tablet by mouth daily.        Follow-up Information     Libby Maw, MD. Schedule an appointment as soon as possible for a visit in 1 week(s).   Specialty: Family Medicine Contact information: [place] [address] [phone]         Leanora Cover, MD. Schedule an appointment as soon as possible for a visit in 1 week(s).   Specialty: Orthopedic Surgery Why: make appointment early next week Contact information: [place] [address] [phone]                Allergies  Allergen Reactions   Penicillins Hives and Shortness Of Breath   Lisinopril Other (See Comments)  Makes feel bad   Lovastatin Other (See Comments)    Cramps in legs and feet   Other Other (See Comments)    Fragrances in soaps, perfumes, shampoos, conditioners    Consultations: Orthopedics   Procedures/Studies: DG MINI C-ARM IMAGE ONLY  Result Date: 06/17/2022 There is no interpretation for this exam.  This order is for images obtained during a surgical procedure.  Please See "Surgeries" Tab for more information regarding the procedure.   CT Maxillofacial Wo Contrast  Result Date: 06/16/2022 CLINICAL DATA:  Frequent falls, facial bruising. EXAM: CT HEAD WITHOUT CONTRAST CT MAXILLOFACIAL WITHOUT CONTRAST TECHNIQUE: Multidetector CT imaging of the head and maxillofacial structures were performed using the standard protocol without intravenous contrast. Multiplanar CT image reconstructions of the maxillofacial structures were also generated. RADIATION DOSE REDUCTION: This exam was performed according to the departmental  dose-optimization program which includes automated exposure control, adjustment of the mA and/or kV according to patient size and/or use of iterative reconstruction technique. COMPARISON:  CT head 04/22/2020 and brain MRI 02/22/2020 FINDINGS: CT HEAD FINDINGS Brain: There is no acute intracranial hemorrhage, extra-axial fluid collection, or acute infarct. Mild background parenchymal volume loss with prominence of the ventricular system and extra-axial CSF spaces is stable. The ventricles are stable in size since 2021. 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 There is no mass lesion.  There is no mass effect or midline shift. Vascular: There is calcification of the bilateral carotid siphons. Skull: Normal. Negative for fracture or focal lesion. Other: There is mild right forehead swelling. CT MAXILLOFACIAL FINDINGS Osseous: There is no acute facial bone fracture. There is no evidence of mandibular dislocation. There is no suspicious osseous lesion. Orbits: The globes and orbits are unremarkable. There is no retrobulbar hematoma. Sinuses: Clear. Soft tissues: Unremarkable. IMPRESSION: 1. No acute intracranial pathology. 2. Mild right forehead swelling without underlying calvarial or facial bone fracture. Electronically Signed   By: Valetta Mole M.D.   On: 06/16/2022 12:51   CT Head Wo Contrast  Result Date: 06/16/2022 CLINICAL DATA:  Frequent falls, facial bruising. EXAM: CT HEAD WITHOUT CONTRAST CT MAXILLOFACIAL WITHOUT CONTRAST TECHNIQUE: Multidetector CT imaging of the head and maxillofacial structures were performed using the standard protocol without intravenous contrast. Multiplanar CT image reconstructions of the maxillofacial structures were also generated. RADIATION DOSE REDUCTION: This exam was performed according to the departmental dose-optimization program which includes automated exposure control, adjustment of the mA and/or kV  according to patient size and/or use of iterative reconstruction technique. COMPARISON:  CT head 04/22/2020 and brain MRI 02/22/2020 FINDINGS: CT HEAD FINDINGS Brain: There is no acute intracranial hemorrhage, extra-axial fluid collection, or acute infarct. Mild background parenchymal volume loss with prominence of the ventricular system and extra-axial CSF spaces is stable. The ventricles are stable in size since 2021. 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 There is no mass lesion.  There is no mass effect or midline shift. Vascular: There is calcification of the bilateral carotid siphons. Skull: Normal. Negative for fracture or focal lesion. Other: There is mild right forehead swelling. CT MAXILLOFACIAL FINDINGS Osseous: There is no acute facial bone fracture. There is no evidence of mandibular dislocation. There is no suspicious osseous lesion. Orbits: The globes and orbits are unremarkable. There is no retrobulbar hematoma. Sinuses: Clear. Soft tissues: Unremarkable. IMPRESSION: 1. No acute intracranial pathology. 2. Mild right forehead swelling without underlying calvarial  or facial bone fracture. Electronically Signed   By: Valetta Mole M.D.   On: 06/16/2022 12:51   DG Forearm Right  Result Date: 06/16/2022 CLINICAL DATA:  Dog bite EXAM: RIGHT FOREARM - 2 VIEW COMPARISON:  Right wrist same day FINDINGS: Comminuted fracture distal radius and ulna. Fracture ulnar styloid. No fracture of the proximal forearm. Negative elbow. Extensive gas in the soft tissues around the wrist due to laceration. IMPRESSION: Comminuted fracture distal radius and ulna. Fracture ulnar styloid. Extensive gas in the soft tissues. Electronically Signed   By: Franchot Gallo M.D.   On: 06/16/2022 11:39   DG Wrist Complete Right  Result Date: 06/16/2022 CLINICAL DATA:  Dog bite EXAM: RIGHT WRIST - COMPLETE 3+ VIEW COMPARISON:  None Available. FINDINGS:  Comminuted fracture distal radius with mild displacement. Fracture distal ulnar diaphysis and ulnar styloid. Extensive gas in the soft tissues due to laceration. No carpal bone fracture. IMPRESSION: 1. Comminuted fracture distal radius with mild displacement. 2. Fracture distal ulnar diaphysis and ulnar styloid. 3. Extensive gas in the soft tissues from laceration. Electronically Signed   By: Franchot Gallo M.D.   On: 06/16/2022 11:38      Subjective: Patient seen and examined at bedside today.  Hemodynamically stable.  Complains of some pain on the operative side.  Eager to go home.  Medically stable for discharge  Discharge Exam: Vitals:   06/18/22 0825 06/18/22 1232  BP: (!) 155/54 (!) 158/67  Pulse: 70 74  Resp:    Temp: 99.8 F (37.7 C) 99 F (37.2 C)  SpO2: 92% 91%   Vitals:   06/17/22 2026 06/18/22 0427 06/18/22 0825 06/18/22 1232  BP: (!) 148/70 (!) 126/58 (!) 155/54 (!) 158/67  Pulse: 73 69 70 74  Resp: 19 15    Temp: 98.8 F (37.1 C) 98.7 F (37.1 C) 99.8 F (37.7 C) 99 F (37.2 C)  TempSrc: Oral Oral Oral Oral  SpO2: 97% 95% 92% 91%    General: Pt is alert, awake, not in acute distress Cardiovascular: RRR, S1/S2 +, no rubs, no gallops Respiratory: CTA bilaterally, no wheezing, no rhonchi Abdominal: Soft, NT, ND, bowel sounds + Extremities: no edema, no cyanosis, right forearm wrapped with dressing/cast    The results of significant diagnostics from this hospitalization (including imaging, microbiology, ancillary and laboratory) are listed below for reference.     Microbiology: Recent Results (from the past 240 hour(s))  MRSA Next Gen by PCR, Nasal     Status: None   Collection Time: 06/17/22  8:16 AM   Specimen: Nasal Mucosa; Nasal Swab  Result Value Ref Range Status   MRSA by PCR Next Gen NOT DETECTED NOT DETECTED Final    Comment: (NOTE) The GeneXpert MRSA Assay (FDA approved for NASAL specimens only), is one component of a comprehensive MRSA  colonization surveillance program. It is not intended to diagnose MRSA infection nor to guide or monitor treatment for MRSA infections. Test performance is not FDA approved in patients less than 21 years old. Performed at [facility], [place] [address] [place], [place] [address]   Culture, blood (Routine X 2) w Reflex to ID Panel     Status: None (Preliminary result)   Collection Time: 06/17/22  9:45 AM   Specimen: BLOOD  Result Value Ref Range Status   Specimen Description BLOOD BLOOD LEFT ARM  Final   Special Requests   Final    BOTTLES DRAWN AEROBIC AND ANAEROBIC Blood Culture results may not be optimal  due to an inadequate volume of blood received in culture bottles   Culture   Final    NO GROWTH < 24 HOURS Performed at [place] [address] [place], [place] [address]    Report Status PENDING  Incomplete  Culture, blood (Routine X 2) w Reflex to ID Panel     Status: None (Preliminary result)   Collection Time: 06/17/22  9:45 AM   Specimen: BLOOD  Result Value Ref Range Status   Specimen Description BLOOD BLOOD LEFT ARM  Final   Special Requests   Final    BOTTLES DRAWN AEROBIC AND ANAEROBIC Blood Culture results may not be optimal due to an inadequate volume of blood received in culture bottles   Culture   Final    NO GROWTH < 24 HOURS Performed at [facility], [place] [address] [place], [place] [address]    Report Status PENDING  Incomplete     Labs: BNP (last 3 results) No results for input(s): "BNP" in the last 8760 hours. Basic Metabolic Panel: Recent Labs  Lab 06/16/22 1344 06/17/22 0945 06/18/22 0332  NA 140 138 139  K 3.9 4.4 3.8  CL 108 107 103  CO2 '25 23 27  '$ GLUCOSE 191* 172* 112*  BUN '14 11 13  '$ CREATININE 0.96 0.88 0.76  CALCIUM 8.7* 8.5* 8.4*  MG  --  1.9  --    Liver Function Tests: Recent Labs  Lab 06/17/22 0945 06/18/22 0332  AST 21 14*  ALT 19 16  ALKPHOS 52 45  BILITOT 0.6 0.9  PROT 6.3* 5.7*  ALBUMIN 3.5 3.1*    No results for input(s): "LIPASE", "AMYLASE" in the last 168 hours. No results for input(s): "AMMONIA" in the last 168 hours. CBC: Recent Labs  Lab 06/16/22 1344 06/17/22 0945 06/18/22 0332  WBC 7.0 9.1 9.0  NEUTROABS  --  8.1*  --   HGB 13.6 13.4 11.9*  HCT 42.6 40.5 37.3  MCV 98.2 96.4 98.4  PLT 201 179 168   Cardiac Enzymes: No results for input(s): "CKTOTAL", "CKMB", "CKMBINDEX", "TROPONINI" in the last 168 hours. BNP: Invalid input(s): "POCBNP" CBG: No results for input(s): "GLUCAP" in the last 168 hours. D-Dimer No results for input(s): "DDIMER" in the last 72 hours. Hgb A1c Recent Labs    06/17/22 0945  HGBA1C 6.1*   Lipid Profile No results for input(s): "CHOL", "HDL", "LDLCALC", "TRIG", "CHOLHDL", "LDLDIRECT" in the last 72 hours. Thyroid function studies No results for input(s): "TSH", "T4TOTAL", "T3FREE", "THYROIDAB" in the last 72 hours.  Invalid input(s): "FREET3" Anemia work up No results for input(s): "VITAMINB12", "FOLATE", "FERRITIN", "TIBC", "IRON", "RETICCTPCT" in the last 72 hours. Urinalysis    Component Value Date/Time   COLORURINE YELLOW 12/05/2019 1417   APPEARANCEUR HAZY (A) 12/05/2019 1417   LABSPEC 1.016 12/05/2019 1417   PHURINE 5.0 12/05/2019 1417   GLUCOSEU NEGATIVE 12/05/2019 1417   HGBUR NEGATIVE 12/05/2019 1417   BILIRUBINUR NEGATIVE 12/05/2019 1417   BILIRUBINUR neg 01/22/2014 0926   KETONESUR NEGATIVE 12/05/2019 1417   PROTEINUR NEGATIVE 12/05/2019 1417   UROBILINOGEN 0.2 01/22/2014 0926   NITRITE NEGATIVE 12/05/2019 1417   LEUKOCYTESUR SMALL (A) 12/05/2019 1417   Sepsis Labs Recent Labs  Lab 06/16/22 1344 06/17/22 0945 06/18/22 0332  WBC 7.0 9.1 9.0   Microbiology Recent Results (from the past 240 hour(s))  MRSA Next Gen by PCR, Nasal     Status: None   Collection Time: 06/17/22  8:16 AM   Specimen:  Nasal Mucosa; Nasal Swab  Result Value Ref Range Status   MRSA by PCR Next Gen NOT DETECTED NOT DETECTED Final     Comment: (NOTE) The GeneXpert MRSA Assay (FDA approved for NASAL specimens only), is one component of a comprehensive MRSA colonization surveillance program. It is not intended to diagnose MRSA infection nor to guide or monitor treatment for MRSA infections. Test performance is not FDA approved in patients less than 56 years old. Performed at [facility], [place] [address] [place], [place] [address]   Culture, blood (Routine X 2) w Reflex to ID Panel     Status: None (Preliminary result)   Collection Time: 06/17/22  9:45 AM   Specimen: BLOOD  Result Value Ref Range Status   Specimen Description BLOOD BLOOD LEFT ARM  Final   Special Requests   Final    BOTTLES DRAWN AEROBIC AND ANAEROBIC Blood Culture results may not be optimal due to an inadequate volume of blood received in culture bottles   Culture   Final    NO GROWTH < 24 HOURS Performed at [facility], [place] [address], [place], [place] [address]    Report Status PENDING  Incomplete  Culture, blood (Routine X 2) w Reflex to ID Panel     Status: None (Preliminary result)   Collection Time: 06/17/22  9:45 AM   Specimen: BLOOD  Result Value Ref Range Status   Specimen Description BLOOD BLOOD LEFT ARM  Final   Special Requests   Final    BOTTLES DRAWN AEROBIC AND ANAEROBIC Blood Culture results may not be optimal due to an inadequate volume of blood received in culture bottles   Culture   Final    NO GROWTH < 24 HOURS Performed at [facility], [place] [address] [place], [place] [address]    Report Status PENDING  Incomplete    Please note: You were cared for by a hospitalist during your hospital stay. Once you are discharged, your primary care physician will handle any further medical issues. Please note that NO REFILLS for any discharge medications will be authorized once you are discharged, as it is imperative that you return to your primary care physician (or establish a relationship with a primary care  physician if you do not have one) for your post hospital discharge needs so that they can reassess your need for medications and monitor your lab values.    Time coordinating discharge: 40 minutes  SIGNED:   Shelly Coss, MD  [facility] 06/18/2022, 2:02 PM Pager [phone]  If 7PM-7AM, please contact night-coverage [email or web address] Password [email or web address]

## 2022-06-18 NOTE — Evaluation (Signed)
Physical Therapy Evaluation and Discharge Patient Details Name: Tiffany Murphy MRN: [id number] DOB: 04-30-1951 Today's Date: 06/18/2022  History of Present Illness  Pt is a 71 y.o. F who presents 06/16/2022 after experiencing a dog bite to the right forearm with distal radius and ulna fractures. S/p I&D of right open distal radius and ulnar fractures 06/17/2022. Significant PMH: COPD, asthma, MDD.  Clinical Impression  Pt admitted with above. Presents with distal RUE pain, increased edema, decreased ROM and weakness. Pt ambulating household distances with no assistive device at a min guard assist level. Reviewed elevation, sling use, finger ROM, and cryotherapy. Pt has good family support upon d/c home. No further acute PT needs. Thank you for this consult.     Recommendations for follow up therapy are one component of a multi-disciplinary discharge planning process, led by the attending physician.  Recommendations may be updated based on patient status, additional functional criteria and insurance authorization.  Follow Up Recommendations No PT follow up      Assistance Recommended at Discharge PRN  Patient can return home with the following  A little help with walking and/or transfers;A little help with bathing/dressing/bathroom;Assistance with cooking/housework;Help with stairs or ramp for entrance;Assist for transportation    Equipment Recommendations None recommended by PT  Recommendations for Other Services    OT Evaluation   Functional Status Assessment Patient has had a recent decline in their functional status and demonstrates the ability to make significant improvements in function in a reasonable and predictable amount of time.     Precautions / Restrictions Precautions Precautions: Fall Required Braces or Orthoses: Sling Restrictions Weight Bearing Restrictions: Yes RUE Weight Bearing: Non weight bearing      Mobility  Bed Mobility Overal bed mobility: Needs  Assistance Bed Mobility: Supine to Sit, Sit to Supine     Supine to sit: Min assist Sit to supine: Min assist   General bed mobility comments: MinA for trunk assist to upright and LE assist back into bed.    Transfers Overall transfer level: Needs assistance Equipment used: None Transfers: Sit to/from Stand Sit to Stand: Min guard           General transfer comment: min guard for safety    Ambulation/Gait Ambulation/Gait assistance: Min guard Gait Distance (Feet): 100 Feet Assistive device: None Gait Pattern/deviations: Step-through pattern, Decreased stride length, Shuffle Gait velocity: decreased     General Gait Details: Pt with short, shuffling steps. Min guard for Scientist, research (medical)    Modified Rankin (Stroke Patients Only)       Balance Overall balance assessment: Needs assistance Sitting-balance support: Feet supported Sitting balance-Leahy Scale: Good     Standing balance support: No upper extremity supported, During functional activity Standing balance-Leahy Scale: Fair                               Pertinent Vitals/Pain Pain Assessment Pain Assessment: Faces Faces Pain Scale: Hurts even more Pain Location: R distal UE Pain Descriptors / Indicators: Sharp, Operative site guarding, Grimacing Pain Intervention(s): Limited activity within patient's tolerance, Monitored during session, Repositioned, Ice applied    Home Living Family/patient expects to be discharged to:: Private residence Living Arrangements: Spouse/significant other;Other relatives (step children) Available Help at Discharge: Family;Available 24 hours/day Type of Home: House Home Access: Level entry       Home Layout: One level Home Equipment:  None      Prior Function Prior Level of Function : Independent/Modified Independent             Mobility Comments: Recent fall 2 weeks ago, falling out of bed and hitting head on  dresser while asleep       Hand Dominance        Extremity/Trunk Assessment   Upper Extremity Assessment Upper Extremity Assessment: Defer to OT evaluation    Lower Extremity Assessment Lower Extremity Assessment: Overall WFL for tasks assessed    Cervical / Trunk Assessment Cervical / Trunk Assessment: Normal  Communication   Communication: No difficulties  Cognition Arousal/Alertness: Awake/alert Behavior During Therapy: WFL for tasks assessed/performed Overall Cognitive Status: Within Functional Limits for tasks assessed                                          General Comments      Exercises Hand Exercises Digit Composite Flexion: AAROM, Right, 5 reps, Supine Composite Extension: AAROM, Right, 5 reps, Supine   Assessment/Plan    PT Assessment Patient does not need any further PT services  PT Problem List         PT Treatment Interventions      PT Goals (Current goals can be found in the Care Plan section)  Acute Rehab PT Goals Patient Stated Goal: go home PT Goal Formulation: All assessment and education complete, DC therapy    Frequency       Co-evaluation               AM-PAC PT "6 Clicks" Mobility  Outcome Measure Help needed turning from your back to your side while in a flat bed without using bedrails?: A Little Help needed moving from lying on your back to sitting on the side of a flat bed without using bedrails?: A Little Help needed moving to and from a bed to a chair (including a wheelchair)?: A Little Help needed standing up from a chair using your arms (e.g., wheelchair or bedside chair)?: A Little Help needed to walk in hospital room?: A Little Help needed climbing 3-5 steps with a railing? : A Little 6 Click Score: 18    End of Session Equipment Utilized During Treatment: Other (comment) (sling) Activity Tolerance: Patient tolerated treatment well Patient left: in bed;with call bell/phone within reach;with  family/visitor present Nurse Communication: Mobility status;Other (comment) (IV out) PT Visit Diagnosis: Pain;Unsteadiness on feet (R26.81) Pain - Right/Left: Right Pain - part of body: Arm    Time: 0820-0855 PT Time Calculation (min) (ACUTE ONLY): 35 min   Charges:   PT Evaluation $PT Eval Low Complexity: 1 Low PT Treatments $Therapeutic Activity: 8-22 mins        Wyona Almas, PT, DPT Acute Rehabilitation Services Office [phone]   Deno Etienne 06/18/2022, 9:03 AM

## 2022-06-18 NOTE — Evaluation (Signed)
Occupational Therapy Evaluation Patient Details Name: Tiffany Murphy MRN: [id number] DOB: Oct 24, 1950 Today's Date: 06/18/2022   History of Present Illness Pt is a 71 y.o. F who presents 06/16/2022 after experiencing a dog bite to the right forearm with distal radius and ulna fractures. S/p I&D of right open distal radius and ulnar fractures 06/17/2022. Significant PMH: COPD, asthma, MDD.   Clinical Impression   Tiffany Murphy  was evaluate s/p the above admission list, she is typically indep at baseline but admits to 2 recent falls. Upon evaluation pt had functional limitations due to RUE pain, immobility, and edema throughout, lethargy, decreased activity tolerance, balance and generalized weakness. Overall she required up to min G for transfers and functional mobility. Pt educated on RUE precautions and how to complete ADLs. She requires up to mod A for ADLs. Pt also completed the below R shoulder and digit AAROM exercises for joint integrity and edema management, limited by pain and distal paraesthesias. OT to continue to follow acutely. Recommend d/c home with support of family, recommend pt follow ortho's recommendation for follow up therapies.      Recommendations for follow up therapy are one component of a multi-disciplinary discharge planning process, led by the attending physician.  Recommendations may be updated based on patient status, additional functional criteria and insurance authorization.   Follow Up Recommendations  Follow physician's recommendations for discharge plan and follow up therapies     Assistance Recommended at Discharge Frequent or constant Supervision/Assistance  Patient can return home with the following A little help with walking and/or transfers;A lot of help with bathing/dressing/bathroom;Assistance with feeding;Assistance with cooking/housework;Assist for transportation;Help with stairs or ramp for entrance    Functional Status Assessment  Patient has had a recent  decline in their functional status and demonstrates the ability to make significant improvements in function in a reasonable and predictable amount of time.  Equipment Recommendations  None recommended by OT    Recommendations for Other Services       Precautions / Restrictions Precautions Precautions: Fall Required Braces or Orthoses: Sling;Splint/Cast Splint/Cast: sugartong Splint/Cast - Date Prophylactic Dressing Applied (if applicable):  (applied in OR) Restrictions Weight Bearing Restrictions: Yes RUE Weight Bearing: Non weight bearing Other Position/Activity Restrictions: No orders WB or ROM orders. Pt in sugartong splint blocking elbow and wrist ROM. Assume NWB through entire extremeity.      Mobility Bed Mobility Overal bed mobility: Needs Assistance Bed Mobility: Supine to Sit, Sit to Supine     Supine to sit: Min assist Sit to supine: Min assist        Transfers Overall transfer level: Needs assistance Equipment used: 1 person hand held assist Transfers: Sit to/from Stand Sit to Stand: Min guard                  Balance Overall balance assessment: Needs assistance Sitting-balance support: Feet supported Sitting balance-Leahy Scale: Good     Standing balance support: No upper extremity supported, During functional activity Standing balance-Leahy Scale: Fair                             ADL either performed or assessed with clinical judgement   ADL Overall ADL's : Needs assistance/impaired Eating/Feeding: Set up;Sitting   Grooming: Set up;Sitting   Upper Body Bathing: Moderate assistance;Sitting;Cueing for UE precautions;Cueing for compensatory techniques   Lower Body Bathing: Moderate assistance;Sit to/from stand;Cueing for compensatory techniques   Upper Body Dressing : Moderate assistance;Sitting;Cueing for UE  precautions;Cueing for compensatory techniques   Lower Body Dressing: Moderate assistance;Sit to/from stand   Toilet  Transfer: Min guard;Ambulation   Toileting- Clothing Manipulation and Hygiene: Supervision/safety;Sitting/lateral lean       Functional mobility during ADLs: Min guard General ADL Comments: educated on UB ADL compensatory techniques for RUE     Vision Baseline Vision/History: 0 No visual deficits Vision Assessment?: No apparent visual deficits     Perception Perception Perception Tested?: No   Praxis Praxis Praxis tested?: Not tested    Pertinent Vitals/Pain Pain Assessment Pain Assessment: Faces Faces Pain Scale: Hurts even more Pain Location: R distal UE Pain Descriptors / Indicators: Sharp, Operative site guarding, Grimacing Pain Intervention(s): Limited activity within patient's tolerance, Monitored during session, Repositioned        Extremity/Trunk Assessment Upper Extremity Assessment Upper Extremity Assessment: RUE deficits/detail RUE Deficits / Details: distal fractures, both bones. slinted, immobilized +sling. minimally moving digits and shoulder. pain limited. edematous RUE: Unable to fully assess due to immobilization;Unable to fully assess due to pain RUE Sensation: decreased light touch RUE Coordination: decreased fine motor;decreased gross motor LUE Deficits / Details: WFL   Lower Extremity Assessment Lower Extremity Assessment: Defer to PT evaluation   Cervical / Trunk Assessment Cervical / Trunk Assessment: Normal   Communication Communication Communication: No difficulties   Cognition Arousal/Alertness: Lethargic Behavior During Therapy: WFL for tasks assessed/performed Overall Cognitive Status: Within Functional Limits for tasks assessed           General Comments: sleepy but able to participate     General Comments  VSS on RA. Husband present but sleeping during evalution    Exercises Exercises: General Upper Extremity General Exercises - Upper Extremity Shoulder Flexion: AAROM, Right, 5 reps, Seated Shoulder Extension: AAROM,  Right, 5 reps, Seated Shoulder ABduction: AAROM, Right, 5 reps, Seated Shoulder ADduction: AAROM, Right, 5 reps, Seated Shoulder Horizontal ABduction: AAROM, Right, 5 reps, Seated Shoulder Horizontal ADduction: AAROM, Right, 5 reps, Seated Digit Composite Flexion: AAROM, Right, 5 reps, Seated Composite Extension: AAROM, Right, 5 reps, Seated    Home Living Family/patient expects to be discharged to:: Private residence Living Arrangements: Spouse/significant other;Other relatives Available Help at Discharge: Family;Available 24 hours/day Type of Home: House Home Access: Level entry     Home Layout: One level     Bathroom Shower/Tub: Teacher, early years/pre: Standard     Home Equipment: None          Prior Functioning/Environment Prior Level of Function : Independent/Modified Independent             Mobility Comments: Recent fall 2 weeks ago, falling out of bed and hitting head on dresser while asleep ADLs Comments: indep        OT Problem List: Decreased range of motion;Decreased activity tolerance;Impaired balance (sitting and/or standing);Decreased knowledge of precautions;Impaired UE functional use;Pain;Increased edema      OT Treatment/Interventions: Self-care/ADL training;Therapeutic exercise;DME and/or AE instruction;Therapeutic activities;Patient/family education;Balance training    OT Goals(Current goals can be found in the care plan section) Acute Rehab OT Goals Patient Stated Goal: home OT Goal Formulation: With patient Time For Goal Achievement: 07/02/22 Potential to Achieve Goals: Good ADL Goals Pt Will Perform Grooming: with modified independence;standing Pt Will Perform Upper Body Bathing: with modified independence;sitting Pt Will Perform Upper Body Dressing: with modified independence;sitting Additional ADL Goal #1: Pt will indep complete AROM exercises for R shoulder and digits for joint integrity and edema control  OT Frequency: Min  2X/week  AM-PAC OT "6 Clicks" Daily Activity     Outcome Measure Help from another person eating meals?: A Little Help from another person taking care of personal grooming?: A Little Help from another person toileting, which includes using toliet, bedpan, or urinal?: A Little Help from another person bathing (including washing, rinsing, drying)?: A Lot Help from another person to put on and taking off regular upper body clothing?: A Lot Help from another person to put on and taking off regular lower body clothing?: A Lot 6 Click Score: 15   End of Session Nurse Communication: Mobility status  Activity Tolerance: Patient limited by pain;Patient tolerated treatment well Patient left: in bed;with call bell/phone within reach;with family/visitor present  OT Visit Diagnosis: Other abnormalities of gait and mobility (R26.89);Muscle weakness (generalized) (M62.81);Pain                Time: 1594-7076 OT Time Calculation (min): 18 min Charges:  OT General Charges $OT Visit: 1 Visit OT Evaluation $OT Eval Moderate Complexity: 1 Mod   Jassiel Flye D Causey 06/18/2022, 10:02 AM

## 2022-06-18 NOTE — Plan of Care (Signed)

## 2022-06-19 ENCOUNTER — Encounter: Payer: Self-pay | Admitting: Internal Medicine

## 2022-06-19 ENCOUNTER — Other Ambulatory Visit: Payer: Self-pay | Admitting: Internal Medicine

## 2022-06-19 ENCOUNTER — Other Ambulatory Visit: Payer: Self-pay | Admitting: Family Medicine

## 2022-06-19 DIAGNOSIS — F418 Other specified anxiety disorders: Secondary | ICD-10-CM

## 2022-06-19 MED ORDER — DOXYCYCLINE HYCLATE 100 MG PO TABS: 100.0000 mg | ORAL_TABLET | Freq: Two times a day (BID) | ORAL | Status: AC

## 2022-06-19 NOTE — Progress Notes (Unsigned)
{  Select_TRH_Note:[id number]} 

## 2022-06-21 ENCOUNTER — Telehealth: Payer: Self-pay

## 2022-06-21 DIAGNOSIS — R52 Pain, unspecified: Secondary | ICD-10-CM | POA: Diagnosis not present

## 2022-06-21 DIAGNOSIS — S52551B Other extraarticular fracture of lower end of right radius, initial encounter for open fracture type I or II: Secondary | ICD-10-CM | POA: Diagnosis not present

## 2022-06-21 DIAGNOSIS — M25631 Stiffness of right wrist, not elsewhere classified: Secondary | ICD-10-CM | POA: Diagnosis not present

## 2022-06-21 DIAGNOSIS — S52691B Other fracture of lower end of right ulna, initial encounter for open fracture type I or II: Secondary | ICD-10-CM | POA: Diagnosis not present

## 2022-06-21 NOTE — Patient Outreach (Signed)
  Care Coordination [facility] Note Transition Care Management Unsuccessful Follow-up Telephone Call  Date of discharge and from [facility]  Attempts:  1st Attempt  Reason for unsuccessful TCM follow-up call:  Left voice message  Johnney Killian, RN, BSN, CCM Care Management Coordinator [facility] Phone: [phone]: [phone]

## 2022-06-22 ENCOUNTER — Telehealth: Payer: Self-pay

## 2022-06-22 LAB — CULTURE, BLOOD (ROUTINE X 2)
Culture: NO GROWTH
Culture: NO GROWTH

## 2022-06-22 NOTE — Patient Outreach (Signed)
  Care Coordination [facility] Note Transition Care Management Unsuccessful Follow-up Telephone Call  Date of discharge and from [facility]  Attempts:  2nd Attempt  Reason for unsuccessful TCM follow-up call:  No answer/busy  Johnney Killian, RN, BSN, CCM Care Management Coordinator [facility] Phone: [phone]: [phone]

## 2022-06-23 ENCOUNTER — Ambulatory Visit: Payer: Medicare Other | Admitting: Family Medicine

## 2022-06-23 ENCOUNTER — Telehealth: Payer: Self-pay

## 2022-06-23 NOTE — Patient Outreach (Signed)
  Care Coordination [facility] Note Transition Care Management Unsuccessful Follow-up Telephone Call  Date of discharge and from where:  Tiffany Murphy 06/17/22  Attempts:  3rd Attempt  Reason for unsuccessful TCM follow-up call:  No answer/busy

## 2022-06-27 DIAGNOSIS — S52551E Other extraarticular fracture of lower end of right radius, subsequent encounter for open fracture type I or II with routine healing: Secondary | ICD-10-CM | POA: Diagnosis not present

## 2022-06-27 DIAGNOSIS — S52691E Other fracture of lower end of right ulna, subsequent encounter for open fracture type I or II with routine healing: Secondary | ICD-10-CM | POA: Diagnosis not present

## 2022-06-28 DIAGNOSIS — S61401D Unspecified open wound of right hand, subsequent encounter: Secondary | ICD-10-CM | POA: Diagnosis not present

## 2022-06-28 DIAGNOSIS — M25631 Stiffness of right wrist, not elsewhere classified: Secondary | ICD-10-CM | POA: Diagnosis not present

## 2022-06-28 DIAGNOSIS — S52551B Other extraarticular fracture of lower end of right radius, initial encounter for open fracture type I or II: Secondary | ICD-10-CM | POA: Diagnosis not present

## 2022-06-28 DIAGNOSIS — R52 Pain, unspecified: Secondary | ICD-10-CM | POA: Diagnosis not present

## 2022-06-28 DIAGNOSIS — S52691B Other fracture of lower end of right ulna, initial encounter for open fracture type I or II: Secondary | ICD-10-CM | POA: Diagnosis not present

## 2022-07-01 DIAGNOSIS — S52551B Other extraarticular fracture of lower end of right radius, initial encounter for open fracture type I or II: Secondary | ICD-10-CM | POA: Diagnosis not present

## 2022-07-01 DIAGNOSIS — M25631 Stiffness of right wrist, not elsewhere classified: Secondary | ICD-10-CM | POA: Diagnosis not present

## 2022-07-01 DIAGNOSIS — R52 Pain, unspecified: Secondary | ICD-10-CM | POA: Diagnosis not present

## 2022-07-01 DIAGNOSIS — S61401D Unspecified open wound of right hand, subsequent encounter: Secondary | ICD-10-CM | POA: Diagnosis not present

## 2022-07-04 ENCOUNTER — Telehealth: Payer: Self-pay | Admitting: Family Medicine

## 2022-07-04 NOTE — Telephone Encounter (Signed)
Pt is wanting a refill on her QUEtiapine (SEROQUEL) 25 MG tablet [[id number]]    [facility] [address] ([address]), [place] [address], [place] ([place]) [place] [address] Phone: [phone]  Fax: [phone].   She says she has discussed having her dosage up'ed from '25mg'$ 's to a higher dosage with Dr Ethelene Hal. She would like to try this.   Pt at [phone]

## 2022-07-04 NOTE — Telephone Encounter (Signed)
Please advise message below, per patient this was discussed to increase dose of Seroquel from '25mg'$  at last visit. Last OV 05/03/22 last RF 03/24/22

## 2022-07-05 DIAGNOSIS — R52 Pain, unspecified: Secondary | ICD-10-CM | POA: Diagnosis not present

## 2022-07-05 DIAGNOSIS — M25631 Stiffness of right wrist, not elsewhere classified: Secondary | ICD-10-CM | POA: Diagnosis not present

## 2022-07-05 DIAGNOSIS — S52551B Other extraarticular fracture of lower end of right radius, initial encounter for open fracture type I or II: Secondary | ICD-10-CM | POA: Diagnosis not present

## 2022-07-05 DIAGNOSIS — S52691B Other fracture of lower end of right ulna, initial encounter for open fracture type I or II: Secondary | ICD-10-CM | POA: Diagnosis not present

## 2022-07-05 DIAGNOSIS — S61401D Unspecified open wound of right hand, subsequent encounter: Secondary | ICD-10-CM | POA: Diagnosis not present

## 2022-07-05 NOTE — Telephone Encounter (Signed)
Called patient to inform that Dr. Ethelene Hal will discuss increase at next visit. Patient not able to come to the phone but her daughter states she will relay the message. Patient has 25 mg of Seroquel at pharmacy ready for pick up.

## 2022-07-07 DIAGNOSIS — S52691B Other fracture of lower end of right ulna, initial encounter for open fracture type I or II: Secondary | ICD-10-CM | POA: Diagnosis not present

## 2022-07-07 DIAGNOSIS — R52 Pain, unspecified: Secondary | ICD-10-CM | POA: Diagnosis not present

## 2022-07-07 DIAGNOSIS — M25631 Stiffness of right wrist, not elsewhere classified: Secondary | ICD-10-CM | POA: Diagnosis not present

## 2022-07-07 DIAGNOSIS — S61401D Unspecified open wound of right hand, subsequent encounter: Secondary | ICD-10-CM | POA: Diagnosis not present

## 2022-07-07 DIAGNOSIS — S52551B Other extraarticular fracture of lower end of right radius, initial encounter for open fracture type I or II: Secondary | ICD-10-CM | POA: Diagnosis not present

## 2022-07-11 DIAGNOSIS — M25631 Stiffness of right wrist, not elsewhere classified: Secondary | ICD-10-CM | POA: Diagnosis not present

## 2022-07-11 DIAGNOSIS — R52 Pain, unspecified: Secondary | ICD-10-CM | POA: Diagnosis not present

## 2022-07-11 DIAGNOSIS — S52691B Other fracture of lower end of right ulna, initial encounter for open fracture type I or II: Secondary | ICD-10-CM | POA: Diagnosis not present

## 2022-07-11 DIAGNOSIS — S52551B Other extraarticular fracture of lower end of right radius, initial encounter for open fracture type I or II: Secondary | ICD-10-CM | POA: Diagnosis not present

## 2022-07-11 DIAGNOSIS — S61401D Unspecified open wound of right hand, subsequent encounter: Secondary | ICD-10-CM | POA: Diagnosis not present

## 2022-07-16 ENCOUNTER — Other Ambulatory Visit: Payer: Self-pay | Admitting: Family Medicine

## 2022-07-16 DIAGNOSIS — G894 Chronic pain syndrome: Secondary | ICD-10-CM

## 2022-07-16 DIAGNOSIS — G8929 Other chronic pain: Secondary | ICD-10-CM

## 2022-07-18 DIAGNOSIS — M25631 Stiffness of right wrist, not elsewhere classified: Secondary | ICD-10-CM | POA: Diagnosis not present

## 2022-07-18 DIAGNOSIS — S61401D Unspecified open wound of right hand, subsequent encounter: Secondary | ICD-10-CM | POA: Diagnosis not present

## 2022-07-18 DIAGNOSIS — R52 Pain, unspecified: Secondary | ICD-10-CM | POA: Diagnosis not present

## 2022-07-18 DIAGNOSIS — S52551B Other extraarticular fracture of lower end of right radius, initial encounter for open fracture type I or II: Secondary | ICD-10-CM | POA: Diagnosis not present

## 2022-07-21 DIAGNOSIS — S61401D Unspecified open wound of right hand, subsequent encounter: Secondary | ICD-10-CM | POA: Diagnosis not present

## 2022-07-21 DIAGNOSIS — M25631 Stiffness of right wrist, not elsewhere classified: Secondary | ICD-10-CM | POA: Diagnosis not present

## 2022-07-21 DIAGNOSIS — S52691B Other fracture of lower end of right ulna, initial encounter for open fracture type I or II: Secondary | ICD-10-CM | POA: Diagnosis not present

## 2022-07-21 DIAGNOSIS — S52551B Other extraarticular fracture of lower end of right radius, initial encounter for open fracture type I or II: Secondary | ICD-10-CM | POA: Diagnosis not present

## 2022-07-21 DIAGNOSIS — R52 Pain, unspecified: Secondary | ICD-10-CM | POA: Diagnosis not present

## 2022-07-26 ENCOUNTER — Other Ambulatory Visit: Payer: Self-pay

## 2022-07-26 ENCOUNTER — Other Ambulatory Visit: Payer: Self-pay | Admitting: Orthopedic Surgery

## 2022-07-26 ENCOUNTER — Encounter (HOSPITAL_BASED_OUTPATIENT_CLINIC_OR_DEPARTMENT_OTHER): Payer: Self-pay | Admitting: Orthopedic Surgery

## 2022-07-26 ENCOUNTER — Encounter (HOSPITAL_BASED_OUTPATIENT_CLINIC_OR_DEPARTMENT_OTHER)
Admission: RE | Admit: 2022-07-26 | Discharge: 2022-07-26 | Disposition: A | Discharge status: Home / Self Care | Payer: Medicare HMO | Source: Ambulatory Visit | Attending: Orthopedic Surgery | Admitting: Orthopedic Surgery

## 2022-07-26 DIAGNOSIS — S52691B Other fracture of lower end of right ulna, initial encounter for open fracture type I or II: Secondary | ICD-10-CM | POA: Diagnosis not present

## 2022-07-26 DIAGNOSIS — S52551B Other extraarticular fracture of lower end of right radius, initial encounter for open fracture type I or II: Secondary | ICD-10-CM | POA: Diagnosis not present

## 2022-07-26 DIAGNOSIS — R52 Pain, unspecified: Secondary | ICD-10-CM | POA: Diagnosis not present

## 2022-07-26 DIAGNOSIS — I10 Essential (primary) hypertension: Secondary | ICD-10-CM | POA: Diagnosis not present

## 2022-07-26 DIAGNOSIS — Z01818 Encounter for other preprocedural examination: Secondary | ICD-10-CM | POA: Diagnosis not present

## 2022-07-26 DIAGNOSIS — R9431 Abnormal electrocardiogram [ECG] [EKG]: Secondary | ICD-10-CM | POA: Insufficient documentation

## 2022-07-26 DIAGNOSIS — S52551E Other extraarticular fracture of lower end of right radius, subsequent encounter for open fracture type I or II with routine healing: Secondary | ICD-10-CM | POA: Diagnosis not present

## 2022-07-26 DIAGNOSIS — S61401D Unspecified open wound of right hand, subsequent encounter: Secondary | ICD-10-CM | POA: Diagnosis not present

## 2022-07-26 DIAGNOSIS — S52691E Other fracture of lower end of right ulna, subsequent encounter for open fracture type I or II with routine healing: Secondary | ICD-10-CM | POA: Diagnosis not present

## 2022-07-26 DIAGNOSIS — M25631 Stiffness of right wrist, not elsewhere classified: Secondary | ICD-10-CM | POA: Diagnosis not present

## 2022-07-28 ENCOUNTER — Encounter (HOSPITAL_BASED_OUTPATIENT_CLINIC_OR_DEPARTMENT_OTHER)
Admission: RE | Disposition: A | Discharge status: Home / Self Care | Payer: Self-pay | Source: Ambulatory Visit | Attending: Orthopedic Surgery

## 2022-07-28 ENCOUNTER — Other Ambulatory Visit: Payer: Self-pay

## 2022-07-28 ENCOUNTER — Ambulatory Visit (HOSPITAL_BASED_OUTPATIENT_CLINIC_OR_DEPARTMENT_OTHER): Payer: Medicare HMO | Admitting: Anesthesiology

## 2022-07-28 ENCOUNTER — Encounter (HOSPITAL_BASED_OUTPATIENT_CLINIC_OR_DEPARTMENT_OTHER): Payer: Self-pay | Admitting: Orthopedic Surgery

## 2022-07-28 ENCOUNTER — Ambulatory Visit (HOSPITAL_BASED_OUTPATIENT_CLINIC_OR_DEPARTMENT_OTHER): Payer: Medicare HMO

## 2022-07-28 ENCOUNTER — Ambulatory Visit (HOSPITAL_BASED_OUTPATIENT_CLINIC_OR_DEPARTMENT_OTHER)
Admission: RE | Admit: 2022-07-28 | Discharge: 2022-07-28 | Disposition: A | Discharge status: Home / Self Care | Payer: Medicare HMO | Source: Ambulatory Visit | Attending: Orthopedic Surgery | Admitting: Orthopedic Surgery

## 2022-07-28 DIAGNOSIS — I10 Essential (primary) hypertension: Secondary | ICD-10-CM

## 2022-07-28 DIAGNOSIS — W19XXXA Unspecified fall, initial encounter: Secondary | ICD-10-CM | POA: Insufficient documentation

## 2022-07-28 DIAGNOSIS — K219 Gastro-esophageal reflux disease without esophagitis: Secondary | ICD-10-CM | POA: Insufficient documentation

## 2022-07-28 DIAGNOSIS — J449 Chronic obstructive pulmonary disease, unspecified: Secondary | ICD-10-CM | POA: Diagnosis not present

## 2022-07-28 DIAGNOSIS — S52601E Unspecified fracture of lower end of right ulna, subsequent encounter for open fracture type I or II with routine healing: Secondary | ICD-10-CM

## 2022-07-28 DIAGNOSIS — S52201A Unspecified fracture of shaft of right ulna, initial encounter for closed fracture: Secondary | ICD-10-CM | POA: Diagnosis not present

## 2022-07-28 DIAGNOSIS — Z87891 Personal history of nicotine dependence: Secondary | ICD-10-CM

## 2022-07-28 DIAGNOSIS — S52601A Unspecified fracture of lower end of right ulna, initial encounter for closed fracture: Secondary | ICD-10-CM | POA: Insufficient documentation

## 2022-07-28 DIAGNOSIS — I2510 Atherosclerotic heart disease of native coronary artery without angina pectoris: Secondary | ICD-10-CM | POA: Diagnosis not present

## 2022-07-28 DIAGNOSIS — Z7951 Long term (current) use of inhaled steroids: Secondary | ICD-10-CM | POA: Insufficient documentation

## 2022-07-28 DIAGNOSIS — S52501A Unspecified fracture of the lower end of right radius, initial encounter for closed fracture: Secondary | ICD-10-CM | POA: Diagnosis not present

## 2022-07-28 DIAGNOSIS — G4730 Sleep apnea, unspecified: Secondary | ICD-10-CM | POA: Insufficient documentation

## 2022-07-28 DIAGNOSIS — S52551B Other extraarticular fracture of lower end of right radius, initial encounter for open fracture type I or II: Secondary | ICD-10-CM | POA: Diagnosis not present

## 2022-07-28 DIAGNOSIS — S52551E Other extraarticular fracture of lower end of right radius, subsequent encounter for open fracture type I or II with routine healing: Secondary | ICD-10-CM

## 2022-07-28 DIAGNOSIS — F418 Other specified anxiety disorders: Secondary | ICD-10-CM | POA: Diagnosis not present

## 2022-07-28 DIAGNOSIS — J4489 Other specified chronic obstructive pulmonary disease: Secondary | ICD-10-CM | POA: Insufficient documentation

## 2022-07-28 DIAGNOSIS — Z79899 Other long term (current) drug therapy: Secondary | ICD-10-CM | POA: Insufficient documentation

## 2022-07-28 DIAGNOSIS — S61551A Open bite of right wrist, initial encounter: Secondary | ICD-10-CM | POA: Insufficient documentation

## 2022-07-28 DIAGNOSIS — S52551A Other extraarticular fracture of lower end of right radius, initial encounter for closed fracture: Secondary | ICD-10-CM | POA: Insufficient documentation

## 2022-07-28 DIAGNOSIS — S52601B Unspecified fracture of lower end of right ulna, initial encounter for open fracture type I or II: Secondary | ICD-10-CM | POA: Diagnosis not present

## 2022-07-28 HISTORY — PX: OPEN REDUCTION INTERNAL FIXATION (ORIF) DISTAL RADIAL FRACTURE: SHX5989

## 2022-07-28 SURGERY — OPEN REDUCTION INTERNAL FIXATION (ORIF) DISTAL RADIUS FRACTURE
Anesthesia: Monitor Anesthesia Care | Site: Wrist | Laterality: Right

## 2022-07-28 MED ORDER — 0.9 % SODIUM CHLORIDE (POUR BTL) OPTIME
TOPICAL | Status: DC | PRN
  Administered 2022-07-28: 200 mL

## 2022-07-28 MED ORDER — MIDAZOLAM HCL 2 MG/2ML IJ SOLN
INTRAMUSCULAR | Status: AC
  Filled 2022-07-28: qty 2

## 2022-07-28 MED ORDER — ONDANSETRON HCL 4 MG/2ML IJ SOLN
INTRAMUSCULAR | Status: AC
  Filled 2022-07-28: qty 2

## 2022-07-28 MED ORDER — OXYCODONE-ACETAMINOPHEN 5-325 MG PO TABS: ORAL_TABLET | ORAL | 0 refills | Status: DC

## 2022-07-28 MED ORDER — FENTANYL CITRATE (PF) 100 MCG/2ML IJ SOLN
INTRAMUSCULAR | Status: AC
  Filled 2022-07-28: qty 2

## 2022-07-28 MED ORDER — PROPOFOL 500 MG/50ML IV EMUL
INTRAVENOUS | Status: DC | PRN
  Administered 2022-07-28: 75 ug/kg/min via INTRAVENOUS

## 2022-07-28 MED ORDER — FENTANYL CITRATE (PF) 100 MCG/2ML IJ SOLN
100.0000 ug | Freq: Once | INTRAMUSCULAR | Status: AC
  Administered 2022-07-28: 50 ug via INTRAVENOUS

## 2022-07-28 MED ORDER — DEXAMETHASONE SODIUM PHOSPHATE 10 MG/ML IJ SOLN
INTRAMUSCULAR | Status: DC | PRN
  Administered 2022-07-28: 5 mg

## 2022-07-28 MED ORDER — FENTANYL CITRATE (PF) 100 MCG/2ML IJ SOLN: 25.0000 ug | INTRAMUSCULAR | Status: DC | PRN

## 2022-07-28 MED ORDER — ROPIVACAINE HCL 5 MG/ML IJ SOLN
INTRAMUSCULAR | Status: DC | PRN
  Administered 2022-07-28: 25 mL via PERINEURAL

## 2022-07-28 MED ORDER — ACETAMINOPHEN 500 MG PO TABS
ORAL_TABLET | ORAL | Status: AC
  Filled 2022-07-28: qty 2

## 2022-07-28 MED ORDER — ACETAMINOPHEN 500 MG PO TABS
1000.0000 mg | ORAL_TABLET | Freq: Once | ORAL | Status: AC
  Administered 2022-07-28: 1000 mg via ORAL

## 2022-07-28 MED ORDER — VANCOMYCIN HCL IN DEXTROSE 1-5 GM/200ML-% IV SOLN
INTRAVENOUS | Status: AC
  Filled 2022-07-28: qty 200

## 2022-07-28 MED ORDER — VANCOMYCIN HCL IN DEXTROSE 1-5 GM/200ML-% IV SOLN
1000.0000 mg | INTRAVENOUS | Status: AC
  Administered 2022-07-28: 1000 mg via INTRAVENOUS

## 2022-07-28 MED ORDER — LACTATED RINGERS IV SOLN: INTRAVENOUS | Status: DC

## 2022-07-28 MED ORDER — ONDANSETRON HCL 4 MG/2ML IJ SOLN
INTRAMUSCULAR | Status: DC | PRN
  Administered 2022-07-28: 4 mg via INTRAVENOUS

## 2022-07-28 SURGICAL SUPPLY — 73 items
APL PRP STRL LF DISP 70% ISPRP (MISCELLANEOUS) ×2
BIT DRILL 2.0 LNG QUCK RELEASE (BIT) ×1 IMPLANT
BIT DRILL 2.8 QUICK RELEASE (BIT) ×1 IMPLANT
BLADE MINI RND TIP GREEN BEAV (BLADE) IMPLANT
BLADE SURG 15 STRL LF DISP TIS (BLADE) ×4 IMPLANT
BLADE SURG 15 STRL SS (BLADE) ×4
BNDG CMPR 9X4 STRL LF SNTH (GAUZE/BANDAGES/DRESSINGS) ×2
BNDG ELASTIC 2X5.8 VLCR STR LF (GAUZE/BANDAGES/DRESSINGS) ×2 IMPLANT
BNDG ELASTIC 3X5.8 VLCR STR LF (GAUZE/BANDAGES/DRESSINGS) ×2 IMPLANT
BNDG ESMARK 4X9 LF (GAUZE/BANDAGES/DRESSINGS) ×2 IMPLANT
BNDG GAUZE DERMACEA FLUFF 4 (GAUZE/BANDAGES/DRESSINGS) ×2 IMPLANT
BNDG GZE 12X3 1 PLY HI ABS (GAUZE/BANDAGES/DRESSINGS)
BNDG GZE DERMACEA 4 6PLY (GAUZE/BANDAGES/DRESSINGS) ×2
BNDG PLASTER X FAST 3X3 WHT LF (CAST SUPPLIES) ×20 IMPLANT
BNDG PLSTR 9X3 FST ST WHT (CAST SUPPLIES) ×20
BNDG STRETCH GAUZE 3IN X12FT (GAUZE/BANDAGES/DRESSINGS) IMPLANT
CHLORAPREP W/TINT 26 (MISCELLANEOUS) ×2 IMPLANT
CORD BIPOLAR FORCEPS 12FT (ELECTRODE) ×2 IMPLANT
COVER BACK TABLE 60X90IN (DRAPES) ×2 IMPLANT
COVER MAYO STAND STRL (DRAPES) ×2 IMPLANT
CUFF TOURN SGL QUICK 18X4 (TOURNIQUET CUFF) ×2 IMPLANT
CUFF TOURN SGL QUICK 24 (TOURNIQUET CUFF)
CUFF TRNQT CYL 24X4X16.5-23 (TOURNIQUET CUFF) IMPLANT
DRAPE EXTREMITY T 121X128X90 (DISPOSABLE) ×2 IMPLANT
DRAPE OEC MINIVIEW 54X84 (DRAPES) ×2 IMPLANT
DRAPE SURG 17X23 STRL (DRAPES) ×2 IMPLANT
DRILL 2.0 LNG QUICK RELEASE (BIT) ×2
DRILL 2.8 QUICK RELEASE (BIT) ×2
GAUZE SPONGE 4X4 12PLY STRL (GAUZE/BANDAGES/DRESSINGS) ×2 IMPLANT
GAUZE XEROFORM 1X8 LF (GAUZE/BANDAGES/DRESSINGS) ×2 IMPLANT
GLOVE BIO SURGEON STRL SZ7.5 (GLOVE) ×2 IMPLANT
GLOVE BIOGEL PI IND STRL 8 (GLOVE) ×2 IMPLANT
GLOVE BIOGEL PI IND STRL 8.5 (GLOVE) ×1 IMPLANT
GLOVE SURG ORTHO 8.0 STRL STRW (GLOVE) ×1 IMPLANT
GOWN STRL REUS W/ TWL LRG LVL3 (GOWN DISPOSABLE) ×2 IMPLANT
GOWN STRL REUS W/TWL LRG LVL3 (GOWN DISPOSABLE) ×2
GOWN STRL REUS W/TWL XL LVL3 (GOWN DISPOSABLE) ×3 IMPLANT
GUIDEWIRE ORTHO 0.054X6 (WIRE) ×3 IMPLANT
NDL HYPO 25X1 1.5 SAFETY (NEEDLE) IMPLANT
NEEDLE HYPO 22GX1.5 SAFETY (NEEDLE) IMPLANT
NEEDLE HYPO 25X1 1.5 SAFETY (NEEDLE) IMPLANT
NS IRRIG 1000ML POUR BTL (IV SOLUTION) ×2 IMPLANT
PACK BASIN DAY SURGERY FS (CUSTOM PROCEDURE TRAY) ×2 IMPLANT
PAD CAST 3X4 CTTN HI CHSV (CAST SUPPLIES) ×2 IMPLANT
PAD CAST 4YDX4 CTTN HI CHSV (CAST SUPPLIES) ×1 IMPLANT
PADDING CAST ABS COTTON 4X4 ST (CAST SUPPLIES) ×2 IMPLANT
PADDING CAST COTTON 3X4 STRL (CAST SUPPLIES) ×2
PADDING CAST COTTON 4X4 STRL (CAST SUPPLIES) ×2
PLATE R NARROW PROC VDR (Plate) ×1 IMPLANT
SCREW ACTK 2 NL HEX 3.5.11 (Screw) ×1 IMPLANT
SCREW BN FT 16X2.3XLCK HEX CRT (Screw) ×2 IMPLANT
SCREW CORTICAL LOCKING 2.3X16M (Screw) ×4 IMPLANT
SCREW CORTICAL LOCKING 2.3X18M (Screw) ×4 IMPLANT
SCREW CORTICAL LOCKING 2.3X20M (Screw) ×4 IMPLANT
SCREW FX18X2.3XSMTH LCK NS CRT (Screw) ×2 IMPLANT
SCREW FX20X2.3XSMTH LCK NS CRT (Screw) ×2 IMPLANT
SCREW NONLOCK HEX 3.5X12 (Screw) ×2 IMPLANT
SLEEVE SCD COMPRESS KNEE MED (STOCKING) ×1 IMPLANT
SPLINT PLASTER CAST XFAST 4X15 (CAST SUPPLIES) IMPLANT
STAPLER VISISTAT (STAPLE) IMPLANT
STOCKINETTE 4X48 STRL (DRAPES) ×2 IMPLANT
SUCTION FRAZIER HANDLE 10FR (MISCELLANEOUS) ×2
SUCTION TUBE FRAZIER 10FR DISP (MISCELLANEOUS) ×2 IMPLANT
SUT ETHILON 3 0 PS 1 (SUTURE) IMPLANT
SUT ETHILON 4 0 PS 2 18 (SUTURE) ×2 IMPLANT
SUT VIC AB 3-0 PS1 18 (SUTURE)
SUT VIC AB 3-0 PS1 18XBRD (SUTURE) IMPLANT
SUT VICRYL 4-0 PS2 18IN ABS (SUTURE) ×2 IMPLANT
SYR BULB EAR ULCER 3OZ GRN STR (SYRINGE) ×2 IMPLANT
SYR CONTROL 10ML LL (SYRINGE) IMPLANT
TOWEL GREEN STERILE FF (TOWEL DISPOSABLE) ×4 IMPLANT
TUBE CONNECTING 20X1/4 (TUBING) ×2 IMPLANT
UNDERPAD 30X36 HEAVY ABSORB (UNDERPADS AND DIAPERS) ×2 IMPLANT

## 2022-07-28 NOTE — H&P (Signed)
Tiffany Murphy is an 71 y.o. female.   Chief Complaint: distal radius and ulna fractures HPI: 71 yo female sustained fractures of right distal radius and ulna from dog bite and fall four weeks ago.  Underwent irrigation and debridement of wounds.  Wounds have healed.  Fracture is displaced.  She wishes to proceed with operative reduction and fixation.  Allergies:  Allergies  Allergen Reactions   Penicillins Hives and Shortness Of Breath   Lisinopril Other (See Comments)    Makes feel bad   Lovastatin Other (See Comments)    Cramps in legs and feet   Other Other (See Comments)    Fragrances in soaps, perfumes, shampoos, conditioners    Past Medical History:  Diagnosis Date   Allergy    "THINK I GOT THEM"   Anxiety    Arthritis    "BODY FULL OF ARTHRITIS"   Asthma    Bronchitis    Cancer (HCC)    "RIGHT LEG SQUAMOUS CELL"   Closed fracture of left distal radius 10/25/2018   COPD (chronic obstructive pulmonary disease) (HCC)    Depression    GERD (gastroesophageal reflux disease)    Headache    Hypercholesteremia    Hypertension    IBS (irritable bowel syndrome)    Sleep apnea    Tobacco abuse    Vitamin D deficiency     Past Surgical History:  Procedure Laterality Date   APPENDECTOMY     COLONOSCOPY  08/26/2015   Colonic polyps status post polypectomy. Minimal sigmoid diverticulosis.    ESOPHAGOGASTRODUODENOSCOPY  02/25/2011   Large hiatal hernia otherwise normal EGD.   I & D EXTREMITY Right 06/17/2022   Procedure: IRRIGATION AND DEBRIDEMENT RIGHT UPPER EXTREMITY;  Surgeon: Phoenix Dresser, MD;  Location: [facility] OR;  Service: Orthopedics;  Laterality: Right;   OPEN REDUCTION INTERNAL FIXATION (ORIF) DISTAL RADIAL FRACTURE Left 10/25/2018   Procedure: OPEN REDUCTION INTERNAL FIXATION (ORIF)LEFT  DISTAL RADIAL FRACTURE;  Surgeon: Landau, Joshua, MD;  Location: [facility];  Service: Orthopedics;  Laterality: Left;   POLYPECTOMY     VAGINAL HYSTERECTOMY       Family History: Family History  Problem Relation Age of Onset   CAD Mother 62   Alzheimer's disease Father 75   Diabetes Father    Colon cancer Neg Hx    Esophageal cancer Neg Hx    Colon polyps Neg Hx    Rectal cancer Neg Hx    Stomach cancer Neg Hx     Social History:   reports that she quit smoking about 2 years ago. Her smoking use included cigarettes. She has a 22.00 pack-year smoking history. She has been exposed to tobacco smoke. She has never used smokeless tobacco. She reports current alcohol use. She reports current drug use. Drug: Marijuana.  Medications: Medications Prior to Admission  Medication Sig Dispense Refill   acetaminophen (TYLENOL) 650 MG CR tablet Take 1,300 mg by mouth every 8 (eight) hours as needed for pain.     albuterol (PROVENTIL) (2.5 MG/3ML) 0.083% nebulizer solution Take 3 mLs (2.5 mg total) by nebulization every 6 (six) hours as needed for wheezing or shortness of breath. 75 mL 6   albuterol (VENTOLIN HFA) 108 (90 Base) MCG/ACT inhaler Inhale 2 puffs into the lungs every 6 (six) hours as needed for wheezing. 1 each 0   buPROPion (WELLBUTRIN XL) 300 MG 24 hr tablet Take 1 tablet by mouth once daily 90 tablet 1   celecoxib (CELEBREX) 200 MG capsule Take   1 capsule (200 mg total) by mouth daily as needed. (Patient taking differently: Take 200 mg by mouth daily as needed for mild pain.) 30 capsule 2   Cyanocobalamin (VITAMIN B 12 PO) Take 1 tablet by mouth daily.     fluticasone (FLONASE) 50 MCG/ACT nasal spray Place 2 sprays into each nostril once a day 16 g 1   Fluticasone-Umeclidin-Vilant (TRELEGY ELLIPTA) 100-62.5-25 MCG/ACT AEPB Inhale 1 puff into the lungs daily. INHALE 1 PUFF ONCE DAILY 60 each 6   gabapentin (NEURONTIN) 300 MG capsule TAKE 1 CAPSULE BY MOUTH IN THE MORNING AND 1 IN THE EVENING AND 2 AT BEDTIME 360 capsule 0   losartan (COZAAR) 100 MG tablet Take 1 tablet by mouth once daily 30 tablet 0   montelukast (SINGULAIR) 10 MG tablet Take  1 tablet (10 mg total) by mouth at bedtime. 30 tablet 0   Multiple Vitamin (MULTIVITAMIN WITH MINERALS) TABS tablet Take 1 tablet by mouth daily.     Omega-3 Fatty Acids (FISH OIL) 1000 MG CPDR Take 1,000 mg by mouth daily.     omeprazole (PRILOSEC) 40 MG capsule Take 1 capsule (40 mg total) by mouth daily. 90 capsule 1   oxyCODONE-acetaminophen (PERCOCET/ROXICET) 5-325 MG tablet Take 1 tablet by mouth every 4 (four) hours as needed for moderate pain. 20 tablet 0   polyethylene glycol powder (GLYCOLAX/MIRALAX) 17 GM/SCOOP powder Dissolve 1 scoop of medication in water daily as needed for constipation. 850 g 1   QUEtiapine (SEROQUEL) 25 MG tablet Take 1 tablet (25 mg total) by mouth at bedtime. 30 tablet 2   rosuvastatin (CRESTOR) 40 MG tablet TAKE 1/2 (ONE-HALF) TABLET BY MOUTH AT BEDTIME Strength: 40 mg 45 tablet 0   sertraline (ZOLOFT) 100 MG tablet Take 2 tablets by mouth once daily 60 tablet 0   acyclovir (ZOVIRAX) 200 MG capsule Take 200 mg by mouth daily as needed (for break out).     hydrocortisone (ANUSOL-HC) 2.5 % rectal cream Insert and apply rectally 2 times a day for 10 days (Patient taking differently: Place 1 Application rectally daily as needed for hemorrhoids.) 30 g 2    No results found for this or any previous visit (from the past 48 hour(s)).  No results found.    Blood pressure (!) 145/75, pulse 61, temperature 98.6 F (37 C), temperature source Oral, resp. rate 14, height 5' 2" (1.575 m), weight 78.5 kg, SpO2 92 %.  General appearance: alert, cooperative, and appears stated age Head: Normocephalic, without obvious abnormality, atraumatic Neck: supple, symmetrical, trachea midline Extremities: Intact sensation and capillary refill all digits.  +epl/fpl/io.  No wounds.  Pulses: 2+ and symmetric Skin: Skin color, texture, turgor normal. No rashes or lesions Neurologic: Grossly normal Incision/Wound: none  Assessment/Plan Right distal radius and ulna fractures.  Non  operative and operative treatment options have been discussed with the patient and patient wishes to proceed with operative treatment. Risks, benefits, and alternatives of surgery have been discussed and the patient agrees with the plan of care.   Tiffany Murphy 07/28/2022, 12:16 PM   

## 2022-07-28 NOTE — Op Note (Signed)
I assisted Surgeon(s) and Role:    * Leanora Cover, MD - Primary    Daryll Brod, MD - Assisting on the Procedure(s): OPEN REDUCTION INTERNAL FIXATION (ORIF) RIGHT DISTAL RADIUS FRACTURE POSSIBLE OPEN REDUCTION INTERNAL FIXATION (ORIF) DISTAL ULNA FRACTURE on 07/28/2022.  I provided assistance on this case as follows: Set up, approach, release of the brachial radialis retraction for identification of the fracture with removal of callus, mobilization of the fracture, release of scar, reduction and stabilization of the fracture, application of plate and screws, closure of the wound and application of the dressing and splint.  Electronically signed by: Daryll Brod, MD Date: 07/28/2022 Time: 2:01 PM

## 2022-07-28 NOTE — Op Note (Signed)
07/28/2022 [facility]  Operative Note  Pre Op Diagnosis: Right distal radius and ulna fractures  Post Op Diagnosis: Right distal radius and ulna fractures  Procedure:  ORIF right extraarticular distal radius fracture Right brachioradialis release Closed treatment distal ulna fracture without reduction  Surgeon: Leanora Cover, MD  Assistant: Daryll Brod, MD  Anesthesia: Regional with sedation  Fluids: Per anesthesia flow sheet  EBL: minimal  Complications: None  Specimen: None  Tourniquet Time:  Total Tourniquet Time Documented: Upper Arm (Right) - 54 minutes Total: Upper Arm (Right) - 54 minutes   Disposition: Stable to PACU  INDICATIONS:  Tiffany Murphy is a 72 y.o. female was breaking up a dog fight between her dogs 6 weeks ago when she sustained a distal radius and ulna fracture.  This was an open fracture.  It was treated with irrigation debridement night of the injury.  She has healed the wound.  There is displacement of the fracture with abundant callus formation.  She wishes to proceed with operative reduction and fixation.  We discussed nonoperative and operative treatment options.  She wished to proceed with operative fixation.  Risks, benefits, and alternatives of surgery were discussed including the risk of blood loss; infection; damage to nerves, vessels, tendons, ligaments, bone; failure of surgery; need for additional surgery; complications with wound healing; continued pain; nonunion; malunion; stiffness.  We also discussed the possible need for bone graft and the benefits and risks including the possibility of disease transmission.  She voiced understanding of these risks and elected to proceed.    OPERATIVE COURSE:  After being identified preoperatively by myself, the patient and I agreed upon the procedure and site of procedure.  Surgical site was marked.   Surgical consent had been signed.  She was given preoperative IV antibiotic prophylaxis.  She  was transferred to the operating room and placed on the operating room table in supine position with the right upper extremity on an armboard. Sedation was induced by the anesthesiologist.  A regional block had been performed by anesthesia in preoperative holding.  The right upper extremity was prepped and draped in normal sterile orthopedic fashion.  A surgical pause was performed between the surgeons, anesthesia and operating room staff, and all were in agreement as to the patient, procedure and site of procedure.  Tourniquet at the proximal aspect of the extremity was inflated to 250 mmHg after exsanguination of the limb with an Esmarch bandage.  Standard volar Mallie Mussel approach was used.  The bipolar electrocautery was used to obtain hemostasis.  The superficial and deep portions of the FCR tendon sheath were incised, and the FCR and FPL were swept ulnarly to protect the palmar cutaneous branch of the median nerve.  The brachioradialis was released at the radial side of the radius.  The pronator quadratus was released and elevated with the periosteal elevator.  The fracture site was identified.  Tensive callus formation.  This callus was taken down.  Was removed with the rongeurs and freer elevator.  The dorsal periosteum was released to allow mobilization of the fracture.  The fracture was able to be reduced under direct visualization.  An AcuMed volar distal radial locking plate was selected.  It was secured to the bone with the guidepins.  C-arm was used in AP and lateral projections to ensure appropriate reduction and position of the hardware and adjustments made as necessary.  Standard AO drilling and measuring technique was used.  A single screw was placed in the slotted  hole in the shaft of the plate.  The distal holes were filled with locking pegs with the exception of the styloid holes, which were filled with locking screws.  The remaining holes in the shaft of the plate were filled with nonlocking screws.   Good purchase was obtained.  C-arm was used in AP, lateral and oblique projections to ensure appropriate reduction and position of hardware, which was the case.  The ulna was in good reduction.  There was callus formation.  There was no intra-articular penetration of hardware.  The wound was copiously irrigated with sterile saline.  Pronator quadratus was repaired back over top of the plate using 4-0 Vicryl suture.  Vicryl suture was placed in the subcutaneous tissues in an inverted interrupted fashion and the skin was closed with 4-0 nylon in a horizontal mattress fashion.  There was good pronation and supination of the wrist without crepitance.  The wound was then dressed with sterile Xeroform, 4x4s, and wrapped with a Kerlix bandage.  A volar splint was placed and wrapped with Kerlix and Ace bandage.  Tourniquet was deflated at 54 minutes.  Fingertips were pink with brisk capillary refill after deflation of the tourniquet.  Operative drapes were broken down.  The patient was awoken from anesthesia safely.  She was transferred back to the stretcher and taken to the PACU in stable condition.  I will see her back in the office in one week for postoperative followup.  I will give her a prescription for Percocet 5/325 1-2 tabs PO q6 hours prn pain, dispense # 20.    Leanora Cover, MD Electronically signed, 07/28/22

## 2022-07-28 NOTE — Discharge Instructions (Addendum)
Post Anesthesia Home Care Instructions  Activity: Get plenty of rest for the remainder of the day. A responsible individual must stay with you for 24 hours following the procedure.  For the next 24 hours, DO NOT: -Drive a car -Paediatric nurse -Drink alcoholic beverages -Take any medication unless instructed by your physician -Make any legal decisions or sign important papers.  Meals: Start with liquid foods such as gelatin or soup. Progress to regular foods as tolerated. Avoid greasy, spicy, heavy foods. If nausea and/or vomiting occur, drink only clear liquids until the nausea and/or vomiting subsides. Call your physician if vomiting continues.  Special Instructions/Symptoms: Your throat may feel dry or sore from the anesthesia or the breathing tube placed in your throat during surgery. If this causes discomfort, gargle with warm salt water. The discomfort should disappear within 24 hours.  If you had a scopolamine patch placed behind your ear for the management of post- operative nausea and/or vomiting:  1. The medication in the patch is effective for 72 hours, after which it should be removed.  Wrap patch in a tissue and discard in the trash. Wash hands thoroughly with soap and water. 2. You may remove the patch earlier than 72 hours if you experience unpleasant side effects which may include dry mouth, dizziness or visual disturbances. 3. Avoid touching the patch. Wash your hands with soap and water after contact with the patch.      Regional Anesthesia Blocks  1. Numbness or the inability to move the "blocked" extremity may last from 3-48 hours after placement. The length of time depends on the medication injected and your individual response to the medication. If the numbness is not going away after 48 hours, call your surgeon.  2. The extremity that is blocked will need to be protected until the numbness is gone and the  Strength has returned. Because you cannot feel it, you  will need to take extra care to avoid injury. Because it may be weak, you may have difficulty moving it or using it. You may not know what position it is in without looking at it while the block is in effect.  3. For blocks in the legs and feet, returning to weight bearing and walking needs to be done carefully. You will need to wait until the numbness is entirely gone and the strength has returned. You should be able to move your leg and foot normally before you try and bear weight or walk. You will need someone to be with you when you first try to ensure you do not fall and possibly risk injury.  4. Bruising and tenderness at the needle site are common side effects and will resolve in a few days.  5. Persistent numbness or new problems with movement should be communicated to the surgeon or the [facility] [phone] [place] [phone]).    [facility] Instructions Hand Surgery  Wound Care: Keep your hand elevated above the level of your heart.  Do not allow it to dangle by your side.  Keep the dressing dry and do not remove it unless your doctor advises you to do so.  He will usually change it at the time of your post-op visit.  Moving your fingers is advised to stimulate circulation but will depend on the site of your surgery.  If you have a splint applied, your doctor will advise you regarding movement.  Activity: Do not drive or operate machinery today.  Rest today and  then you may return to your normal activity and work as indicated by your physician.  Diet:  Drink liquids today or eat a light diet.  You may resume a regular diet tomorrow.    General expectations: Pain for two to three days. Fingers may become slightly swollen.  Call your doctor if any of the following occur: Severe pain not relieved by pain medication. Elevated temperature. Dressing soaked with blood. Inability to move fingers. White or bluish color to fingers.

## 2022-07-28 NOTE — Progress Notes (Signed)
Assisted Dr. Lanetta Inch with right, supraclavicular, ultrasound guided block. Side rails up, monitors on throughout procedure. See vital signs in flow sheet. Tolerated Procedure well.

## 2022-07-28 NOTE — Transfer of Care (Signed)
Immediate Anesthesia Transfer of Care Note  Patient: Tiffany Murphy  Procedure(s) Performed: OPEN REDUCTION INTERNAL FIXATION (ORIF) RIGHT DISTAL RADIUS FRACTURE (Right: Wrist) POSSIBLE OPEN REDUCTION INTERNAL FIXATION (ORIF) DISTAL ULNA FRACTURE (Right: Wrist)  Patient Location: PACU  Anesthesia Type:MAC combined with regional for post-op pain  Level of Consciousness: sedated  Airway & Oxygen Therapy: Patient Spontanous Breathing and Patient connected to face mask oxygen  Post-op Assessment: Report given to RN and Post -op Vital signs reviewed and stable  Post vital signs: Reviewed and stable  Last Vitals:  Vitals Value Taken Time  BP 138/56 07/28/22 1404  Temp    Pulse 65 07/28/22 1406  Resp 15 07/28/22 1406  SpO2 99 % 07/28/22 1406  Vitals shown include unvalidated device data.  Last Pain:  Vitals:   07/28/22 1026  TempSrc: Oral  PainSc: 1       Patients Stated Pain Goal: 7 (43/27/61 4709)  Complications: No notable events documented.

## 2022-07-28 NOTE — Anesthesia Procedure Notes (Signed)
Anesthesia Regional Block: Supraclavicular block   Pre-Anesthetic Checklist: , timeout performed,  Correct Patient, Correct Site, Correct Laterality,  Correct Procedure, Correct Position, site marked,  Risks and benefits discussed,  Pre-op evaluation,  At surgeon's request and post-op pain management  Laterality: Right  Prep: Maximum Sterile Barrier Precautions used, chloraprep       Needles:  Injection technique: Single-shot  Needle Type: Echogenic Stimulator Needle     Needle Length: 5cm  Needle Gauge: 21     Additional Needles:   Procedures:,,,, ultrasound used (permanent image in chart),,    Narrative:  Start time: 07/28/2022 11:53 AM End time: 07/28/2022 11:56 AM Injection made incrementally with aspirations every 5 mL. Anesthesiologist: Freddrick March, MD

## 2022-07-28 NOTE — Anesthesia Preprocedure Evaluation (Addendum)
Anesthesia Evaluation  Patient identified by MRN, date of birth, ID band Patient awake    Reviewed: Allergy & Precautions, NPO status , Patient's Chart, lab work & pertinent test results  Airway Mallampati: III  TM Distance: >3 FB Neck ROM: Full    Dental  (+) Edentulous Upper, Edentulous Lower, Dental Advisory Given   Pulmonary asthma , sleep apnea (no CPAP) , COPD,  COPD inhaler, Patient abstained from smoking., former smoker   Pulmonary exam normal breath sounds clear to auscultation       Cardiovascular hypertension, Pt. on medications + CAD  Normal cardiovascular exam Rhythm:Regular Rate:Normal     Neuro/Psych  Headaches PSYCHIATRIC DISORDERS Anxiety Depression       GI/Hepatic Neg liver ROS,GERD  ,,  Endo/Other  negative endocrine ROS    Renal/GU negative Renal ROS  negative genitourinary   Musculoskeletal negative musculoskeletal ROS (+)    Abdominal   Peds  Hematology negative hematology ROS (+)   Anesthesia Other Findings   Reproductive/Obstetrics                             Anesthesia Physical Anesthesia Plan  ASA: 3  Anesthesia Plan: MAC and Regional   Post-op Pain Management: Regional block* and Tylenol PO (pre-op)*   Induction: Intravenous  PONV Risk Score and Plan: 2 and Propofol infusion, Treatment may vary due to age or medical condition, Ondansetron and Dexamethasone  Airway Management Planned: Natural Airway  Additional Equipment:   Intra-op Plan:   Post-operative Plan:   Informed Consent: I have reviewed the patients History and Physical, chart, labs and discussed the procedure including the risks, benefits and alternatives for the proposed anesthesia with the patient or authorized representative who has indicated his/her understanding and acceptance.     Dental advisory given  Plan Discussed with: CRNA  Anesthesia Plan Comments:         Anesthesia Quick Evaluation

## 2022-07-28 NOTE — Anesthesia Postprocedure Evaluation (Signed)
Anesthesia Post Note  Patient: Tiffany Murphy  Procedure(s) Performed: OPEN REDUCTION INTERNAL FIXATION (ORIF) RIGHT DISTAL RADIUS FRACTURE (Right: Wrist) POSSIBLE OPEN REDUCTION INTERNAL FIXATION (ORIF) DISTAL ULNA FRACTURE (Right: Wrist)     Patient location during evaluation: PACU Anesthesia Type: Regional and MAC Level of consciousness: awake and alert Pain management: pain level controlled Vital Signs Assessment: post-procedure vital signs reviewed and stable Respiratory status: spontaneous breathing, nonlabored ventilation, respiratory function stable and patient connected to nasal cannula oxygen Cardiovascular status: stable and blood pressure returned to baseline Postop Assessment: no apparent nausea or vomiting Anesthetic complications: no  No notable events documented.  Last Vitals:  Vitals:   07/28/22 1430 07/28/22 1446  BP: (!) 157/56 (!) 154/63  Pulse: 60 64  Resp: 14 16  Temp:  36.6 C  SpO2: 94% 93%    Last Pain:  Vitals:   07/28/22 1446  TempSrc: Oral  PainSc: 0-No pain                 Sesar Madewell L Jes Costales

## 2022-07-28 NOTE — H&P (View-Only) (Signed)
Tiffany Murphy is an 72 y.o. female.   Chief Complaint: distal radius and ulna fractures HPI: 72 yo female sustained fractures of right distal radius and ulna from dog bite and fall four weeks ago.  Underwent irrigation and debridement of wounds.  Wounds have healed.  Fracture is displaced.  She wishes to proceed with operative reduction and fixation.  Allergies:  Allergies  Allergen Reactions   Penicillins Hives and Shortness Of Breath   Lisinopril Other (See Comments)    Makes feel bad   Lovastatin Other (See Comments)    Cramps in legs and feet   Other Other (See Comments)    Fragrances in soaps, perfumes, shampoos, conditioners    Past Medical History:  Diagnosis Date   Allergy    "THINK I GOT THEM"   Anxiety    Arthritis    "BODY FULL OF ARTHRITIS"   Asthma    Bronchitis    Cancer ([place])    "RIGHT LEG SQUAMOUS CELL"   Closed fracture of left distal radius 10/25/2018   COPD (chronic obstructive pulmonary disease) (HCC)    Depression    GERD (gastroesophageal reflux disease)    Headache    Hypercholesteremia    Hypertension    IBS (irritable bowel syndrome)    Sleep apnea    Tobacco abuse    Vitamin D deficiency     Past Surgical History:  Procedure Laterality Date   APPENDECTOMY     COLONOSCOPY  08/26/2015   Colonic polyps status post polypectomy. Minimal sigmoid diverticulosis.    ESOPHAGOGASTRODUODENOSCOPY  02/25/2011   Large hiatal hernia otherwise normal EGD.   I & D EXTREMITY Right 06/17/2022   Procedure: IRRIGATION AND DEBRIDEMENT RIGHT UPPER EXTREMITY;  Surgeon: Leanora Cover, MD;  Location: [facility];  Service: Orthopedics;  Laterality: Right;   OPEN REDUCTION INTERNAL FIXATION (ORIF) DISTAL RADIAL FRACTURE Left 10/25/2018   Procedure: OPEN REDUCTION INTERNAL FIXATION (ORIF)LEFT  DISTAL RADIAL FRACTURE;  Surgeon: Marchia Bond, MD;  Location: [facility];  Service: Orthopedics;  Laterality: Left;   POLYPECTOMY     VAGINAL HYSTERECTOMY       Family History: Family History  Problem Relation Age of Onset   CAD Mother 16   Alzheimer's disease Father 70   Diabetes Father    Colon cancer Neg Hx    Esophageal cancer Neg Hx    Colon polyps Neg Hx    Rectal cancer Neg Hx    Stomach cancer Neg Hx     Social History:   reports that she quit smoking about 2 years ago. Her smoking use included cigarettes. She has a 22.00 pack-year smoking history. She has been exposed to tobacco smoke. She has never used smokeless tobacco. She reports current alcohol use. She reports current drug use. Drug: Marijuana.  Medications: Medications Prior to Admission  Medication Sig Dispense Refill   acetaminophen (TYLENOL) 650 MG CR tablet Take 1,300 mg by mouth every 8 (eight) hours as needed for pain.     albuterol (PROVENTIL) (2.5 MG/3ML) 0.083% nebulizer solution Take 3 mLs (2.5 mg total) by nebulization every 6 (six) hours as needed for wheezing or shortness of breath. 75 mL 6   albuterol (VENTOLIN HFA) 108 (90 Base) MCG/ACT inhaler Inhale 2 puffs into the lungs every 6 (six) hours as needed for wheezing. 1 each 0   buPROPion (WELLBUTRIN XL) 300 MG 24 hr tablet Take 1 tablet by mouth once daily 90 tablet 1   celecoxib (CELEBREX) 200 MG capsule Take  1 capsule (200 mg total) by mouth daily as needed. (Patient taking differently: Take 200 mg by mouth daily as needed for mild pain.) 30 capsule 2   Cyanocobalamin (VITAMIN B 12 PO) Take 1 tablet by mouth daily.     fluticasone (FLONASE) 50 MCG/ACT nasal spray Place 2 sprays into each nostril once a day 16 g 1   Fluticasone-Umeclidin-Vilant (TRELEGY ELLIPTA) 100-62.5-25 MCG/ACT AEPB Inhale 1 puff into the lungs daily. INHALE 1 PUFF ONCE DAILY 60 each 6   gabapentin (NEURONTIN) 300 MG capsule TAKE 1 CAPSULE BY MOUTH IN THE MORNING AND 1 IN THE EVENING AND 2 AT BEDTIME 360 capsule 0   losartan (COZAAR) 100 MG tablet Take 1 tablet by mouth once daily 30 tablet 0   montelukast (SINGULAIR) 10 MG tablet Take  1 tablet (10 mg total) by mouth at bedtime. 30 tablet 0   Multiple Vitamin (MULTIVITAMIN WITH MINERALS) TABS tablet Take 1 tablet by mouth daily.     Omega-3 Fatty Acids (FISH OIL) 1000 MG CPDR Take 1,000 mg by mouth daily.     omeprazole (PRILOSEC) 40 MG capsule Take 1 capsule (40 mg total) by mouth daily. 90 capsule 1   oxyCODONE-acetaminophen (PERCOCET/ROXICET) 5-325 MG tablet Take 1 tablet by mouth every 4 (four) hours as needed for moderate pain. 20 tablet 0   polyethylene glycol powder (GLYCOLAX/MIRALAX) 17 GM/SCOOP powder Dissolve 1 scoop of medication in water daily as needed for constipation. 850 g 1   QUEtiapine (SEROQUEL) 25 MG tablet Take 1 tablet (25 mg total) by mouth at bedtime. 30 tablet 2   rosuvastatin (CRESTOR) 40 MG tablet TAKE 1/2 (ONE-HALF) TABLET BY MOUTH AT BEDTIME Strength: 40 mg 45 tablet 0   sertraline (ZOLOFT) 100 MG tablet Take 2 tablets by mouth once daily 60 tablet 0   acyclovir (ZOVIRAX) 200 MG capsule Take 200 mg by mouth daily as needed (for break out).     hydrocortisone (ANUSOL-HC) 2.5 % rectal cream Insert and apply rectally 2 times a day for 10 days (Patient taking differently: Place 1 Application rectally daily as needed for hemorrhoids.) 30 g 2    No results found for this or any previous visit (from the past 48 hour(s)).  No results found.    Blood pressure (!) 145/75, pulse 61, temperature 98.6 F (37 C), temperature source Oral, resp. rate 14, height '5\' 2"'$  (1.575 m), weight 78.5 kg, SpO2 92 %.  General appearance: alert, cooperative, and appears stated age Head: Normocephalic, without obvious abnormality, atraumatic Neck: supple, symmetrical, trachea midline Extremities: Intact sensation and capillary refill all digits.  +epl/fpl/io.  No wounds.  Pulses: 2+ and symmetric Skin: Skin color, texture, turgor normal. No rashes or lesions Neurologic: Grossly normal Incision/Wound: none  Assessment/Plan Right distal radius and ulna fractures.  Non  operative and operative treatment options have been discussed with the patient and patient wishes to proceed with operative treatment. Risks, benefits, and alternatives of surgery have been discussed and the patient agrees with the plan of care.   Leanora Cover 07/28/2022, 12:16 PM

## 2022-07-29 ENCOUNTER — Encounter (HOSPITAL_BASED_OUTPATIENT_CLINIC_OR_DEPARTMENT_OTHER): Payer: Self-pay | Admitting: Orthopedic Surgery

## 2022-07-29 ENCOUNTER — Telehealth: Payer: Self-pay

## 2022-07-29 NOTE — Telephone Encounter (Signed)
Transition Care Management Follow-up Telephone Call Date of discharge and from where: 07/28/22  How have you been since you were released from the hospital? I'm hurting, but I'm alright Any questions or concerns? No  Items Reviewed: Did the pt receive and understand the discharge instructions provided? Yes  Medications obtained and verified? Yes  Other? Yes  Any new allergies since your discharge? No  Dietary orders reviewed? No Do you have support at home? Yes    Follow up appointments reviewed:  PCP Hospital f/u appt confirmed? No  Scheduled to see - on - @ -. [facility] f/u appt confirmed? Yes  Scheduled to see Dr. Fredna Dow on 08/04/22 @ unknown. Are transportation arrangements needed? No If their condition worsens, is the pt aware to call PCP or go to the Emergency Dept.? Yes Was the patient provided with contact information for the PCP's office or ED? Yes Was to pt encouraged to call back with questions or concerns? Yes   Angeline Slim, RN, BSN RN Clinical Supervisor [facility] [employer]

## 2022-08-03 ENCOUNTER — Encounter: Payer: Self-pay | Admitting: Family Medicine

## 2022-08-03 ENCOUNTER — Ambulatory Visit: Payer: Medicare HMO | Admitting: Family Medicine

## 2022-08-03 VITALS — BP 156/74 | HR 64 | Temp 98.1°F | Ht 62.0 in | Wt 175.2 lb

## 2022-08-03 DIAGNOSIS — F418 Other specified anxiety disorders: Secondary | ICD-10-CM | POA: Diagnosis not present

## 2022-08-03 DIAGNOSIS — R69 Illness, unspecified: Secondary | ICD-10-CM | POA: Diagnosis not present

## 2022-08-03 DIAGNOSIS — F439 Reaction to severe stress, unspecified: Secondary | ICD-10-CM | POA: Diagnosis not present

## 2022-08-03 DIAGNOSIS — R7303 Prediabetes: Secondary | ICD-10-CM

## 2022-08-03 DIAGNOSIS — I10 Essential (primary) hypertension: Secondary | ICD-10-CM | POA: Diagnosis not present

## 2022-08-03 MED ORDER — METFORMIN HCL ER 500 MG PO TB24: 500.0000 mg | ORAL_TABLET | Freq: Every day | ORAL | 0 refills | Status: DC

## 2022-08-03 MED ORDER — QUETIAPINE FUMARATE 50 MG PO TABS: 50.0000 mg | ORAL_TABLET | Freq: Every day | ORAL | 1 refills | Status: DC

## 2022-08-03 MED ORDER — AMLODIPINE BESYLATE 5 MG PO TABS: 5.0000 mg | ORAL_TABLET | Freq: Every day | ORAL | 0 refills | Status: DC
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
## 2022-08-04 DIAGNOSIS — S52551E Other extraarticular fracture of lower end of right radius, subsequent encounter for open fracture type I or II with routine healing: Secondary | ICD-10-CM | POA: Diagnosis not present

## 2022-08-12 ENCOUNTER — Other Ambulatory Visit: Payer: Self-pay | Admitting: Orthopedic Surgery

## 2022-08-12 ENCOUNTER — Other Ambulatory Visit: Payer: Self-pay

## 2022-08-12 ENCOUNTER — Encounter (HOSPITAL_BASED_OUTPATIENT_CLINIC_OR_DEPARTMENT_OTHER): Payer: Self-pay | Admitting: Orthopedic Surgery

## 2022-08-12 ENCOUNTER — Encounter (HOSPITAL_BASED_OUTPATIENT_CLINIC_OR_DEPARTMENT_OTHER)
Admission: RE | Admit: 2022-08-12 | Discharge: 2022-08-12 | Disposition: A | Discharge status: Home / Self Care | Payer: Medicare HMO | Source: Ambulatory Visit | Attending: Orthopedic Surgery | Admitting: Orthopedic Surgery

## 2022-08-12 DIAGNOSIS — S52691E Other fracture of lower end of right ulna, subsequent encounter for open fracture type I or II with routine healing: Secondary | ICD-10-CM | POA: Diagnosis not present

## 2022-08-12 DIAGNOSIS — Z01812 Encounter for preprocedural laboratory examination: Secondary | ICD-10-CM | POA: Insufficient documentation

## 2022-08-12 DIAGNOSIS — S52551E Other extraarticular fracture of lower end of right radius, subsequent encounter for open fracture type I or II with routine healing: Secondary | ICD-10-CM | POA: Diagnosis not present

## 2022-08-12 LAB — BASIC METABOLIC PANEL
Anion gap: 7 (ref 5–15)
BUN: 13 mg/dL (ref 8–23)
CO2: 25 mmol/L (ref 22–32)
Calcium: 8.9 mg/dL (ref 8.9–10.3)
Chloride: 103 mmol/L (ref 98–111)
Creatinine, Ser: 0.8 mg/dL (ref 0.44–1.00)
GFR, Estimated: >60 mL/min (ref >60)
Glucose, Bld: 98 mg/dL (ref 70–99)
Potassium: 4 mmol/L (ref 3.5–5.1)
Sodium: 135 mmol/L (ref 135–145)

## 2022-08-12 NOTE — Progress Notes (Signed)

## 2022-08-15 ENCOUNTER — Telehealth: Payer: Self-pay | Admitting: Family Medicine

## 2022-08-15 DIAGNOSIS — F418 Other specified anxiety disorders: Secondary | ICD-10-CM

## 2022-08-15 NOTE — Telephone Encounter (Signed)
Refill request for pending Rx last refill 06/20/22 please advise. Last OV 08/03/22

## 2022-08-15 NOTE — Telephone Encounter (Signed)
Caller Name: Lamani Call back phone #: [phone]  Reason for Call: Pt has to have 2nd surgery on her arm tomorrow. She somehow lost her script of Sertrline. She stated she has cried and yelled at everyone then she realized she has been without for a few days. Can you help with this? She also would like Dr Ethelene Hal to call in 800 mg ibuprofen for her pain.

## 2022-08-16 ENCOUNTER — Other Ambulatory Visit: Payer: Self-pay

## 2022-08-16 ENCOUNTER — Ambulatory Visit (HOSPITAL_BASED_OUTPATIENT_CLINIC_OR_DEPARTMENT_OTHER): Payer: Medicare HMO | Admitting: Certified Registered"

## 2022-08-16 ENCOUNTER — Ambulatory Visit (HOSPITAL_BASED_OUTPATIENT_CLINIC_OR_DEPARTMENT_OTHER): Payer: Medicare HMO

## 2022-08-16 ENCOUNTER — Encounter (HOSPITAL_BASED_OUTPATIENT_CLINIC_OR_DEPARTMENT_OTHER)
Admission: RE | Disposition: A | Discharge status: Home / Self Care | Payer: Self-pay | Source: Home / Self Care | Attending: Orthopedic Surgery

## 2022-08-16 ENCOUNTER — Ambulatory Visit (HOSPITAL_BASED_OUTPATIENT_CLINIC_OR_DEPARTMENT_OTHER)
Admission: RE | Admit: 2022-08-16 | Discharge: 2022-08-16 | Disposition: A | Discharge status: Home / Self Care | Payer: Medicare HMO | Attending: Orthopedic Surgery | Admitting: Orthopedic Surgery

## 2022-08-16 ENCOUNTER — Encounter (HOSPITAL_BASED_OUTPATIENT_CLINIC_OR_DEPARTMENT_OTHER): Payer: Self-pay | Admitting: Orthopedic Surgery

## 2022-08-16 DIAGNOSIS — I2510 Atherosclerotic heart disease of native coronary artery without angina pectoris: Secondary | ICD-10-CM | POA: Diagnosis not present

## 2022-08-16 DIAGNOSIS — G4730 Sleep apnea, unspecified: Secondary | ICD-10-CM | POA: Diagnosis not present

## 2022-08-16 DIAGNOSIS — K219 Gastro-esophageal reflux disease without esophagitis: Secondary | ICD-10-CM | POA: Insufficient documentation

## 2022-08-16 DIAGNOSIS — Z87891 Personal history of nicotine dependence: Secondary | ICD-10-CM | POA: Insufficient documentation

## 2022-08-16 DIAGNOSIS — Z7984 Long term (current) use of oral hypoglycemic drugs: Secondary | ICD-10-CM | POA: Insufficient documentation

## 2022-08-16 DIAGNOSIS — M1990 Unspecified osteoarthritis, unspecified site: Secondary | ICD-10-CM | POA: Insufficient documentation

## 2022-08-16 DIAGNOSIS — F32A Depression, unspecified: Secondary | ICD-10-CM | POA: Insufficient documentation

## 2022-08-16 DIAGNOSIS — Z79899 Other long term (current) drug therapy: Secondary | ICD-10-CM | POA: Diagnosis not present

## 2022-08-16 DIAGNOSIS — W19XXXA Unspecified fall, initial encounter: Secondary | ICD-10-CM | POA: Diagnosis not present

## 2022-08-16 DIAGNOSIS — S5291XA Unspecified fracture of right forearm, initial encounter for closed fracture: Secondary | ICD-10-CM | POA: Diagnosis not present

## 2022-08-16 DIAGNOSIS — I10 Essential (primary) hypertension: Secondary | ICD-10-CM | POA: Diagnosis not present

## 2022-08-16 DIAGNOSIS — J449 Chronic obstructive pulmonary disease, unspecified: Secondary | ICD-10-CM | POA: Diagnosis not present

## 2022-08-16 DIAGNOSIS — E7800 Pure hypercholesterolemia, unspecified: Secondary | ICD-10-CM | POA: Insufficient documentation

## 2022-08-16 DIAGNOSIS — E119 Type 2 diabetes mellitus without complications: Secondary | ICD-10-CM | POA: Insufficient documentation

## 2022-08-16 DIAGNOSIS — R69 Illness, unspecified: Secondary | ICD-10-CM | POA: Diagnosis not present

## 2022-08-16 DIAGNOSIS — S52301A Unspecified fracture of shaft of right radius, initial encounter for closed fracture: Secondary | ICD-10-CM | POA: Insufficient documentation

## 2022-08-16 DIAGNOSIS — F419 Anxiety disorder, unspecified: Secondary | ICD-10-CM | POA: Diagnosis not present

## 2022-08-16 DIAGNOSIS — W540XXA Bitten by dog, initial encounter: Secondary | ICD-10-CM | POA: Insufficient documentation

## 2022-08-16 HISTORY — DX: Prediabetes: R73.03

## 2022-08-16 HISTORY — PX: OPEN REDUCTION INTERNAL FIXATION (ORIF) DISTAL RADIAL FRACTURE: SHX5989

## 2022-08-16 HISTORY — PX: HARDWARE REMOVAL: SHX979

## 2022-08-16 LAB — GLUCOSE, CAPILLARY
Glucose-Capillary: 96 mg/dL (ref 70–99)
Glucose-Capillary: 123 mg/dL — ABNORMAL HIGH (ref 70–99)

## 2022-08-16 SURGERY — OPEN REDUCTION INTERNAL FIXATION (ORIF) DISTAL RADIUS FRACTURE
Anesthesia: Regional | Site: Wrist | Laterality: Right

## 2022-08-16 MED ORDER — PROPOFOL 500 MG/50ML IV EMUL
INTRAVENOUS | Status: DC | PRN
  Administered 2022-08-16: 50 ug/kg/min via INTRAVENOUS

## 2022-08-16 MED ORDER — ACETAMINOPHEN 160 MG/5ML PO SOLN: 325.0000 mg | ORAL | Status: DC | PRN

## 2022-08-16 MED ORDER — 0.9 % SODIUM CHLORIDE (POUR BTL) OPTIME
TOPICAL | Status: DC | PRN
  Administered 2022-08-16: 30 mL

## 2022-08-16 MED ORDER — AMISULPRIDE (ANTIEMETIC) 5 MG/2ML IV SOLN: 10.0000 mg | Freq: Once | INTRAVENOUS | Status: DC | PRN

## 2022-08-16 MED ORDER — FENTANYL CITRATE (PF) 100 MCG/2ML IJ SOLN
INTRAMUSCULAR | Status: AC
  Filled 2022-08-16: qty 2

## 2022-08-16 MED ORDER — SERTRALINE HCL 100 MG PO TABS: 200.0000 mg | ORAL_TABLET | Freq: Every day | ORAL | 2 refills | Status: DC

## 2022-08-16 MED ORDER — OXYCODONE HCL 5 MG PO TABS: 5.0000 mg | ORAL_TABLET | Freq: Once | ORAL | Status: DC | PRN

## 2022-08-16 MED ORDER — ONDANSETRON HCL 4 MG/2ML IJ SOLN
INTRAMUSCULAR | Status: DC | PRN
  Administered 2022-08-16: 4 mg via INTRAVENOUS

## 2022-08-16 MED ORDER — ACETAMINOPHEN 325 MG PO TABS: 325.0000 mg | ORAL_TABLET | ORAL | Status: DC | PRN

## 2022-08-16 MED ORDER — ACETAMINOPHEN 10 MG/ML IV SOLN: 1000.0000 mg | Freq: Once | INTRAVENOUS | Status: DC | PRN

## 2022-08-16 MED ORDER — FENTANYL CITRATE (PF) 100 MCG/2ML IJ SOLN
100.0000 ug | Freq: Once | INTRAMUSCULAR | Status: AC
  Administered 2022-08-16: 50 ug via INTRAVENOUS

## 2022-08-16 MED ORDER — MIDAZOLAM HCL 2 MG/2ML IJ SOLN
2.0000 mg | Freq: Once | INTRAMUSCULAR | Status: AC
  Administered 2022-08-16: 1 mg via INTRAVENOUS

## 2022-08-16 MED ORDER — PROPOFOL 10 MG/ML IV BOLUS
INTRAVENOUS | Status: DC | PRN
  Administered 2022-08-16: 20 mg via INTRAVENOUS

## 2022-08-16 MED ORDER — VANCOMYCIN HCL IN DEXTROSE 1-5 GM/200ML-% IV SOLN
INTRAVENOUS | Status: AC
  Filled 2022-08-16: qty 200

## 2022-08-16 MED ORDER — LIDOCAINE HCL (CARDIAC) PF 100 MG/5ML IV SOSY
PREFILLED_SYRINGE | INTRAVENOUS | Status: DC | PRN
  Administered 2022-08-16: 20 mg via INTRAVENOUS

## 2022-08-16 MED ORDER — VANCOMYCIN HCL IN DEXTROSE 1-5 GM/200ML-% IV SOLN
1000.0000 mg | INTRAVENOUS | Status: AC
  Administered 2022-08-16: 1000 mg via INTRAVENOUS

## 2022-08-16 MED ORDER — FENTANYL CITRATE (PF) 100 MCG/2ML IJ SOLN: 25.0000 ug | INTRAMUSCULAR | Status: DC | PRN

## 2022-08-16 MED ORDER — BUPIVACAINE-EPINEPHRINE (PF) 0.5% -1:200000 IJ SOLN
INTRAMUSCULAR | Status: DC | PRN
  Administered 2022-08-16: 30 mL via PERINEURAL

## 2022-08-16 MED ORDER — LACTATED RINGERS IV SOLN: INTRAVENOUS | Status: DC

## 2022-08-16 MED ORDER — OXYCODONE-ACETAMINOPHEN 5-325 MG PO TABS: ORAL_TABLET | ORAL | 0 refills | Status: DC

## 2022-08-16 MED ORDER — OXYCODONE HCL 5 MG/5ML PO SOLN: 5.0000 mg | Freq: Once | ORAL | Status: DC | PRN

## 2022-08-16 MED ORDER — MIDAZOLAM HCL 2 MG/2ML IJ SOLN
INTRAMUSCULAR | Status: AC
  Filled 2022-08-16: qty 2

## 2022-08-16 SURGICAL SUPPLY — 74 items
APL PRP STRL LF DISP 70% ISPRP (MISCELLANEOUS) ×2
BIT DRILL 2.0 LNG QUCK RELEASE (BIT) IMPLANT
BIT DRILL QC 2.8X5 (BIT) IMPLANT
BLADE MINI RND TIP GREEN BEAV (BLADE) IMPLANT
BLADE SURG 15 STRL LF DISP TIS (BLADE) ×4 IMPLANT
BLADE SURG 15 STRL SS (BLADE) ×4
BNDG CMPR 5X2 KNTD ELC UNQ LF (GAUZE/BANDAGES/DRESSINGS)
BNDG CMPR 9X4 STRL LF SNTH (GAUZE/BANDAGES/DRESSINGS) ×2
BNDG COHESIVE 1X5 TAN STRL LF (GAUZE/BANDAGES/DRESSINGS) IMPLANT
BNDG ELASTIC 2INX 5YD STR LF (GAUZE/BANDAGES/DRESSINGS) IMPLANT
BNDG ELASTIC 3X5.8 VLCR STR LF (GAUZE/BANDAGES/DRESSINGS) ×2 IMPLANT
BNDG ESMARK 4X9 LF (GAUZE/BANDAGES/DRESSINGS) ×2 IMPLANT
BNDG GAUZE DERMACEA FLUFF 4 (GAUZE/BANDAGES/DRESSINGS) ×2 IMPLANT
BNDG GZE DERMACEA 4 6PLY (GAUZE/BANDAGES/DRESSINGS) ×2
BNDG PLASTER X FAST 3X3 WHT LF (CAST SUPPLIES) ×20 IMPLANT
BNDG PLSTR 9X3 FST ST WHT (CAST SUPPLIES) ×20
CHLORAPREP W/TINT 26 (MISCELLANEOUS) ×2 IMPLANT
CORD BIPOLAR FORCEPS 12FT (ELECTRODE) ×2 IMPLANT
COVER BACK TABLE 60X90IN (DRAPES) ×2 IMPLANT
COVER MAYO STAND STRL (DRAPES) ×2 IMPLANT
CUFF TOURN SGL QUICK 18X4 (TOURNIQUET CUFF) ×2 IMPLANT
CUFF TOURN SGL QUICK 24 (TOURNIQUET CUFF)
CUFF TRNQT CYL 24X4X16.5-23 (TOURNIQUET CUFF) IMPLANT
DRAPE EXTREMITY T 121X128X90 (DISPOSABLE) ×2 IMPLANT
DRAPE OEC MINIVIEW 54X84 (DRAPES) ×2 IMPLANT
DRAPE SURG 17X23 STRL (DRAPES) ×2 IMPLANT
DRILL 2.0 LNG QUICK RELEASE (BIT) ×2
GAUZE 4X4 16PLY ~~LOC~~+RFID DBL (SPONGE) IMPLANT
GAUZE PAD ABD 8X10 STRL (GAUZE/BANDAGES/DRESSINGS) IMPLANT
GAUZE SPONGE 4X4 12PLY STRL (GAUZE/BANDAGES/DRESSINGS) ×2 IMPLANT
GAUZE XEROFORM 1X8 LF (GAUZE/BANDAGES/DRESSINGS) ×2 IMPLANT
GLOVE BIO SURGEON STRL SZ7.5 (GLOVE) ×2 IMPLANT
GLOVE BIOGEL PI IND STRL 7.0 (GLOVE) IMPLANT
GLOVE BIOGEL PI IND STRL 7.5 (GLOVE) IMPLANT
GLOVE BIOGEL PI IND STRL 8 (GLOVE) ×2 IMPLANT
GLOVE BIOGEL PI IND STRL 8.5 (GLOVE) IMPLANT
GLOVE SURG ORTHO 8.0 STRL STRW (GLOVE) IMPLANT
GLOVE SURG SYN 7.5  E (GLOVE) ×2
GLOVE SURG SYN 7.5 E (GLOVE) ×2 IMPLANT
GLOVE SURG SYN 7.5 PF PI (GLOVE) IMPLANT
GOWN STRL REUS W/ TWL LRG LVL3 (GOWN DISPOSABLE) ×2 IMPLANT
GOWN STRL REUS W/ TWL XL LVL3 (GOWN DISPOSABLE) IMPLANT
GOWN STRL REUS W/TWL LRG LVL3 (GOWN DISPOSABLE)
GOWN STRL REUS W/TWL XL LVL3 (GOWN DISPOSABLE) ×4 IMPLANT
GUIDEWIRE ORTHO 0.054X6 (WIRE) IMPLANT
NDL HYPO 25X1 1.5 SAFETY (NEEDLE) IMPLANT
NEEDLE HYPO 25X1 1.5 SAFETY (NEEDLE) IMPLANT
NS IRRIG 1000ML POUR BTL (IV SOLUTION) ×2 IMPLANT
PACK BASIN DAY SURGERY FS (CUSTOM PROCEDURE TRAY) ×2 IMPLANT
PAD CAST 3X4 CTTN HI CHSV (CAST SUPPLIES) ×2 IMPLANT
PADDING CAST ABS COTTON 4X4 ST (CAST SUPPLIES) ×2 IMPLANT
PADDING CAST COTTON 3X4 STRL (CAST SUPPLIES) ×2
PLATE ACULOC 2 EXTENTION (Plate) IMPLANT
PLATE R LONG NARROW PROX (Plate) IMPLANT
SCREW CANN THRD 5.5MMX55MM (Screw) IMPLANT
SCREW CORTICAL 2.3X14 (Screw) IMPLANT
SCREW CORTICAL LOCKING 2.3X16M (Screw) ×4 IMPLANT
SCREW FX16X2.3XLCK SMTH NS CRT (Screw) IMPLANT
SCREW NON LOCK 3.5X10MM (Screw) IMPLANT
SCREW NON TOGG 2.3X16MM (Screw) IMPLANT
SLEEVE SCD COMPRESS KNEE MED (STOCKING) IMPLANT
SLING ARM FOAM STRAP LRG (SOFTGOODS) IMPLANT
SPIKE FLUID TRANSFER (MISCELLANEOUS) ×2 IMPLANT
SPLINT PLASTER CAST XFAST 3X15 (CAST SUPPLIES) IMPLANT
STOCKINETTE 4X48 STRL (DRAPES) ×2 IMPLANT
STRIP CLOSURE SKIN 1/4X4 (GAUZE/BANDAGES/DRESSINGS) IMPLANT
SUT ETHILON 4 0 PS 2 18 (SUTURE) ×2 IMPLANT
SUT MNCRL AB 4-0 PS2 18 (SUTURE) IMPLANT
SUT VIC AB 3-0 FS2 27 (SUTURE) IMPLANT
SUT VICRYL 4-0 PS2 18IN ABS (SUTURE) ×2 IMPLANT
SYR BULB EAR ULCER 3OZ GRN STR (SYRINGE) ×2 IMPLANT
SYR CONTROL 10ML LL (SYRINGE) IMPLANT
TOWEL GREEN STERILE FF (TOWEL DISPOSABLE) ×4 IMPLANT
UNDERPAD 30X36 HEAVY ABSORB (UNDERPADS AND DIAPERS) ×2 IMPLANT

## 2022-08-16 NOTE — Interval H&P Note (Signed)
History and Physical Interval Note:  08/16/2022 1:58 PM  Tiffany Murphy  has presented today for surgery, with the diagnosis of RIGHT RADIUS FRACTURE.  The various methods of treatment have been discussed with the patient and family. After consideration of risks, benefits and other options for treatment, the patient has consented to  Procedure(s) with comments: OPEN REDUCTION INTERNAL FIXATION (ORIF) RIGHT RADIUS FRACTURE (Right) - 90 MIN POSSIBLE PLATE REMOVAL (Right) as a surgical intervention.  The patient's history has been reviewed, patient examined, no change in status, stable for surgery.  I have reviewed the patient's chart and labs.  Questions were answered to the patient's satisfaction.     Leanora Cover

## 2022-08-16 NOTE — Anesthesia Procedure Notes (Signed)
Procedure Name: MAC Date/Time: 08/16/2022 2:29 PM  Performed by: Aubrielle Stroud, Ernesta Amble, CRNAPre-anesthesia Checklist: Patient identified, Emergency Drugs available, Suction available, Patient being monitored and Timeout performed Patient Re-evaluated:Patient Re-evaluated prior to induction Oxygen Delivery Method: Simple face mask

## 2022-08-16 NOTE — Transfer of Care (Signed)
Immediate Anesthesia Transfer of Care Note  Patient: Tiffany Murphy  Procedure(s) Performed: OPEN REDUCTION INTERNAL FIXATION (ORIF) RIGHT RADIUS FRACTURE (Right) PLATE REMOVAL (Right: Wrist)  Patient Location: PACU  Anesthesia Type:MAC and Regional  Level of Consciousness: awake, alert , and oriented  Airway & Oxygen Therapy: Patient Spontanous Breathing and Patient connected to face mask oxygen  Post-op Assessment: Report given to RN and Post -op Vital signs reviewed and stable  Post vital signs: Reviewed and stable  Last Vitals:  Vitals Value Taken Time  BP    Temp    Pulse    Resp    SpO2      Last Pain:  Vitals:   08/16/22 1247  TempSrc: Oral  PainSc: 2       Patients Stated Pain Goal: 6 ([id number])  Complications: No notable events documented.

## 2022-08-16 NOTE — Anesthesia Preprocedure Evaluation (Addendum)
Anesthesia Evaluation  Patient identified by MRN, date of birth, ID band Patient awake    Reviewed: Allergy & Precautions, NPO status , Patient's Chart, lab work & pertinent test results  Airway Mallampati: I  TM Distance: <3 FB Neck ROM: Full    Dental  (+) Edentulous Upper, Edentulous Lower   Pulmonary asthma , sleep apnea , COPD,  COPD inhaler, Patient abstained from smoking., former smoker    + decreased breath sounds      Cardiovascular hypertension, Pt. on medications + CAD   Rhythm:Regular Rate:Normal     Neuro/Psych  Headaches PSYCHIATRIC DISORDERS Anxiety Depression       GI/Hepatic Neg liver ROS,GERD  Medicated,,  Endo/Other  diabetes, Type 2, Oral Hypoglycemic Agents    Renal/GU Renal disease     Musculoskeletal  (+) Arthritis ,    Abdominal   Peds  Hematology negative hematology ROS (+)   Anesthesia Other Findings   Reproductive/Obstetrics                             Anesthesia Physical Anesthesia Plan  ASA: 3  Anesthesia Plan: Regional   Post-op Pain Management: Minimal or no pain anticipated   Induction: Intravenous  PONV Risk Score and Plan: 0 and Propofol infusion  Airway Management Planned: Natural Airway and Simple Face Mask  Additional Equipment: None  Intra-op Plan:   Post-operative Plan:   Informed Consent: I have reviewed the patients History and Physical, chart, labs and discussed the procedure including the risks, benefits and alternatives for the proposed anesthesia with the patient or authorized representative who has indicated his/her understanding and acceptance.       Plan Discussed with: CRNA  Anesthesia Plan Comments:        Anesthesia Quick Evaluation
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
## 2022-08-16 NOTE — Op Note (Signed)
I assisted Surgeon(s) and Role:    * Leanora Cover, MD - Primary    Daryll Brod, MD - Assisting on the Procedure(s): OPEN REDUCTION INTERNAL FIXATION (ORIF) RIGHT RADIUS FRACTURE PLATE REMOVAL on 579FGE.  I provided assistance on this case as follows: Set up, approach, identification of the fracture, fracture of the radial artery, removal of the granulation tissue and callus, stabilization of the distal fracture, removal of the old plate,, and  fixation of the distal fracture component with a new plate, reduction of the radial shaft fracture, stabilization of the radial shaft fracture and fixation of the new plate with screws, closure of the wound and application of dressing and splints.  Electronically signed by: Daryll Brod, MD Date: 08/16/2022 Time: 3:58 PM

## 2022-08-16 NOTE — Op Note (Signed)
NAME: Tiffany Murphy MEDICAL RECORD NO: [id number] DATE OF BIRTH: May 30, 1951 FACILITY: [facility] LOCATION: [facility] PHYSICIAN: Tennis Must, MD   OPERATIVE REPORT   DATE OF PROCEDURE: 08/16/22    PREOPERATIVE DIAGNOSIS: Right radial shaft fracture post open reduction internal fixation distal radius fracture   POSTOPERATIVE DIAGNOSIS: Right radial shaft fracture post open reduction internal fixation distal radius fracture   PROCEDURE: 1.  Removal of distal radius plate 2.  Open reduction internal fixation radial shaft fracture   SURGEON:  Leanora Cover, M.D.   ASSISTANT: Daryll Brod, MD   ANESTHESIA:  Regional with sedation   INTRAVENOUS FLUIDS:  Per anesthesia flow sheet.   ESTIMATED BLOOD LOSS:  Minimal.   COMPLICATIONS:  None.   SPECIMENS:  none   TOURNIQUET TIME:    Total Tourniquet Time Documented: Upper Arm (Right) - 81 minutes Total: Upper Arm (Right) - 81 minutes    DISPOSITION:  Stable to PACU.   INDICATIONS: 72 year old female status post open reduction internal fixation of intra-articular distal radius fracture.  She had subsequent fracture of the radial shaft through the screw holes of the plate.  She returns for removal of the hardware and open reduction internal fixation.  Risks, benefits and alternatives of surgery were discussed including the risks of blood loss, infection, damage to nerves, vessels, tendons, ligaments, bone for surgery, need for additional surgery, complications with wound healing, continued pain, stiffness, , nonunion, malunion.  She voiced understanding of these risks and elected to proceed.  OPERATIVE COURSE:  After being identified preoperatively by myself,  the patient and I agreed on the procedure and site of the procedure.  The surgical site was marked.  Surgical consent had been signed. Preoperative IV antibiotic prophylaxis was given. She was transferred to the operating room and placed on the operating table in  supine position with the right upper extremity on an arm board.  Sedation was induced by the anesthesiologist. A regional block had been performed by anesthesia in preoperative holding.    Right upper extremity was prepped and draped in normal sterile orthopedic fashion.  A surgical pause was performed between the surgeons, anesthesia, and operating room staff and all were in agreement as to the patient, procedure, and site of procedure.  Tourniquet at the proximal aspect of the extremity was inflated to 250 mmHg after exsanguination of the arm with an Esmarch bandage.  The previous incision was followed.  Subcutaneous tissues were entered by spreading technique.  Bipolar electrocautery was used to obtain hemostasis.  The incision was extended proximally to achieve improved visualization.  The FCR and FPL were swept ulnarly to protect the palmar cutaneous branch of the median nerve.  The pronator quadratus had started to heal over top of the plate.  This was elevated.  Plate was identified.  It was unstable through the fracture at the shaft of the radius.  The shaft screws were removed.  The locking guide was placed back on the plate after removal of one of the distal screws.  The guidepins were placed and the remaining screws removed.  The plate was removed.  The propagated fracture was mobilized and early callus formation removed with the curette and rongeurs.  The fracture was able to be brought back into acceptable reduction.  A new plate was placed over the guidepins.  The pegs and locking screws in the styloid were replaced after measuring.  The plate laid back across the shaft nicely.  The extension was placed onto the  plate to achieve fixation proximal to the fracture propagation.  The C-arm was used in AP and lateral projections to ensure appropriate reduction position of hardware which was the case.  4 screws were placed proximal to the fracture.  Good purchase was obtained.  The plate was not fixated in  the area where it had previously failed.  2 lag screws were placed across the propagated fracture fragment to provide stability.  2.3 millimeter screws were used.  Standard AO drilling measuring technique was used.  C-arm was used in AP lateral and oblique projections to ensure appropriate reduction position of hardware which was the case.  The wound was copiously irrigated with sterile saline.  Pronator quadratus was repaired back over top of the plate with 4-0 Vicryl suture.  Inverted interrupted Vicryl sutures were placed in subcutaneous tissues and the skin was closed with 4-0 nylon in a horizontal mattress fashion.  The tourniquet was deflated at the 81 minutes.  Fingertips were pink with brisk capillary refill after deflation of tourniquet.  The operative  drapes were broken down.  The patient was awoken from anesthesia safely.  She was transferred back to the stretcher and taken to PACU in stable condition.  I will see her back in the office in 1 week for postoperative followup.  I will give her a prescription for Percocet 5/325 1-2 tabs PO q6 hours prn pain, dispense # 20.   Leanora Cover, MD Electronically signed, 08/16/22

## 2022-08-16 NOTE — Anesthesia Procedure Notes (Signed)
Anesthesia Regional Block: Supraclavicular block   Pre-Anesthetic Checklist: , timeout performed,  Correct Patient, Correct Site, Correct Laterality,  Correct Procedure, Correct Position, site marked,  Risks and benefits discussed,  Surgical consent,  Pre-op evaluation,  At surgeon's request and post-op pain management  Laterality: Right  Prep: chloraprep       Needles:  Injection technique: Single-shot  Needle Type: Echogenic Stimulator Needle     Needle Length: 9cm  Needle Gauge: 21     Additional Needles:   Procedures:,,,, ultrasound used (permanent image in chart),,    Narrative:  Start time: 08/16/2022 1:35 PM End time: 08/16/2022 1:40 PM Injection made incrementally with aspirations every 5 mL.  Performed by: Personally  Anesthesiologist: Effie Berkshire, MD  Additional Notes: Discussed risks and benefits of the nerve block in detail, including but not limited vascular injury, permanent nerve damage and infection.   Patient tolerated the procedure well. Local anesthetic introduced in an incremental fashion under minimal resistance after negative aspirations. No paresthesias were elicited. After completion of the procedure, no acute issues were identified and patient continued to be monitored by RN.

## 2022-08-16 NOTE — Progress Notes (Signed)
Assisted Dr. Smith Robert with right, supraclavicular, ultrasound guided block. Side rails up, monitors on throughout procedure. See vital signs in flow sheet. Tolerated Procedure well.

## 2022-08-16 NOTE — Discharge Instructions (Addendum)

## 2022-08-17 ENCOUNTER — Encounter (HOSPITAL_BASED_OUTPATIENT_CLINIC_OR_DEPARTMENT_OTHER): Payer: Self-pay | Admitting: Orthopedic Surgery

## 2022-08-17 NOTE — Anesthesia Postprocedure Evaluation (Signed)
Anesthesia Post Note  Patient: Berkleigh Karaffa  Procedure(s) Performed: OPEN REDUCTION INTERNAL FIXATION (ORIF) RIGHT RADIUS FRACTURE (Right) PLATE REMOVAL (Right: Wrist)     Patient location during evaluation: PACU Anesthesia Type: Regional Level of consciousness: awake and alert Pain management: pain level controlled Vital Signs Assessment: post-procedure vital signs reviewed and stable Respiratory status: spontaneous breathing, nonlabored ventilation, respiratory function stable and patient connected to nasal cannula oxygen Cardiovascular status: stable and blood pressure returned to baseline Postop Assessment: no apparent nausea or vomiting Anesthetic complications: no   No notable events documented.  Last Vitals:  Vitals:   08/16/22 1630 08/16/22 1734  BP: 96/81 (!) 150/65  Pulse: (!) 55 64  Resp: 17 18  Temp:  36.7 C  SpO2: 93% 93%    Last Pain:  Vitals:   08/16/22 1734  TempSrc:   PainSc: 0-No pain                 Effie Berkshire

## 2022-09-04 ENCOUNTER — Emergency Department (HOSPITAL_COMMUNITY): Payer: No Typology Code available for payment source

## 2022-09-04 ENCOUNTER — Emergency Department (HOSPITAL_COMMUNITY)
Admission: EM | Admit: 2022-09-04 | Discharge: 2022-09-04 | Disposition: A | Discharge status: Home / Self Care | Payer: No Typology Code available for payment source | Attending: Emergency Medicine | Admitting: Emergency Medicine

## 2022-09-04 DIAGNOSIS — Z1152 Encounter for screening for COVID-19: Secondary | ICD-10-CM | POA: Diagnosis not present

## 2022-09-04 DIAGNOSIS — T148XXA Other injury of unspecified body region, initial encounter: Secondary | ICD-10-CM

## 2022-09-04 DIAGNOSIS — R42 Dizziness and giddiness: Secondary | ICD-10-CM | POA: Insufficient documentation

## 2022-09-04 DIAGNOSIS — S60414A Abrasion of right ring finger, initial encounter: Secondary | ICD-10-CM | POA: Insufficient documentation

## 2022-09-04 DIAGNOSIS — R531 Weakness: Secondary | ICD-10-CM | POA: Insufficient documentation

## 2022-09-04 DIAGNOSIS — S80211A Abrasion, right knee, initial encounter: Secondary | ICD-10-CM | POA: Insufficient documentation

## 2022-09-04 DIAGNOSIS — S50311A Abrasion of right elbow, initial encounter: Secondary | ICD-10-CM | POA: Insufficient documentation

## 2022-09-04 DIAGNOSIS — W01198A Fall on same level from slipping, tripping and stumbling with subsequent striking against other object, initial encounter: Secondary | ICD-10-CM | POA: Diagnosis not present

## 2022-09-04 DIAGNOSIS — W19XXXA Unspecified fall, initial encounter: Secondary | ICD-10-CM

## 2022-09-04 DIAGNOSIS — I951 Orthostatic hypotension: Secondary | ICD-10-CM | POA: Diagnosis not present

## 2022-09-04 DIAGNOSIS — R296 Repeated falls: Secondary | ICD-10-CM | POA: Diagnosis not present

## 2022-09-04 LAB — COMPREHENSIVE METABOLIC PANEL
ALT: 16 U/L (ref 0–44)
AST: 20 U/L (ref 15–41)
Albumin: 3.9 g/dL (ref 3.5–5.0)
Alkaline Phosphatase: 70 U/L (ref 38–126)
Anion gap: 10 (ref 5–15)
BUN: 16 mg/dL (ref 8–23)
CO2: 25 mmol/L (ref 22–32)
Calcium: 9.2 mg/dL (ref 8.9–10.3)
Chloride: 105 mmol/L (ref 98–111)
Creatinine, Ser: 1.02 mg/dL — ABNORMAL HIGH (ref 0.44–1.00)
GFR, Estimated: 58 mL/min — ABNORMAL LOW (ref >60)
Glucose, Bld: 114 mg/dL — ABNORMAL HIGH (ref 70–99)
Potassium: 4 mmol/L (ref 3.5–5.1)
Sodium: 140 mmol/L (ref 135–145)
Total Bilirubin: 0.4 mg/dL (ref 0.3–1.2)
Total Protein: 6.6 g/dL (ref 6.5–8.1)

## 2022-09-04 LAB — CBC WITH DIFFERENTIAL/PLATELET
Abs Immature Granulocytes: 0.02 10*3/uL (ref 0.00–0.07)
Basophils Absolute: 0.1 10*3/uL (ref 0.0–0.1)
Basophils Relative: 1 %
Eosinophils Absolute: 0.1 10*3/uL (ref 0.0–0.5)
Eosinophils Relative: 2 %
HCT: 41.6 % (ref 36.0–46.0)
Hemoglobin: 13.3 g/dL (ref 12.0–15.0)
Immature Granulocytes: 0 %
Lymphocytes Relative: 22 %
Lymphs Abs: 1.3 10*3/uL (ref 0.7–4.0)
MCH: 30.7 pg (ref 26.0–34.0)
MCHC: 32 g/dL (ref 30.0–36.0)
MCV: 96.1 fL (ref 80.0–100.0)
Monocytes Absolute: 0.4 10*3/uL (ref 0.1–1.0)
Monocytes Relative: 7 %
Neutro Abs: 4 10*3/uL (ref 1.7–7.7)
Neutrophils Relative %: 68 %
Platelets: 208 10*3/uL (ref 150–400)
RBC: 4.33 MIL/uL (ref 3.87–5.11)
RDW: 13.6 % (ref 11.5–15.5)
WBC: 6 10*3/uL (ref 4.0–10.5)
nRBC: 0 % (ref 0.0–0.2)

## 2022-09-04 LAB — URINALYSIS, ROUTINE W REFLEX MICROSCOPIC
Bilirubin Urine: NEGATIVE
Glucose, UA: NEGATIVE mg/dL
Hgb urine dipstick: NEGATIVE
Ketones, ur: NEGATIVE mg/dL
Leukocytes,Ua: NEGATIVE
Nitrite: NEGATIVE
Protein, ur: NEGATIVE mg/dL
Specific Gravity, Urine: 1.019 (ref 1.005–1.030)
pH: 5 (ref 5.0–8.0)

## 2022-09-04 LAB — RESP PANEL BY RT-PCR (RSV, FLU A&B, COVID)  RVPGX2
Influenza A by PCR: NEGATIVE
Influenza B by PCR: NEGATIVE
Resp Syncytial Virus by PCR: NEGATIVE
SARS Coronavirus 2 by RT PCR: NEGATIVE

## 2022-09-04 LAB — TROPONIN I (HIGH SENSITIVITY)
Troponin I (High Sensitivity): 4 ng/L (ref <18)
Troponin I (High Sensitivity): 4 ng/L (ref <18)

## 2022-09-04 LAB — ETHANOL: Alcohol, Ethyl (B): <10 mg/dL (ref <10)

## 2022-09-04 NOTE — ED Provider Notes (Signed)
[facility] Provider Note   CSN: [id number] Arrival date & time: 09/04/22  1849     History  Chief Complaint  Patient presents with   Fall   Weakness    Tiffany Murphy is a 72 y.o. female.  72 year old female with prior medical history as detailed below presents for evaluation.  Patient reports that she was chasing her dogs this evening.  She fell while doing this.  She struck her head.  She also has abrasions to her right elbow and right knee.  She small abrasion to the distal tip of the right ring finger.  She reports that she follows on a fairly regular basis.  Her last fall prior to today was yesterday.  She reports that sometimes she feels "as though her legs just give out".  Patient reports that she did not eat well today.  She denies associated chest pain, shortness of breath, palpitations, loss of consciousness, other complaint.  She denies focal weakness, visual changes, change in speech, other complaint.  The history is provided by the patient and medical records.       Home Medications Prior to Admission medications   Medication Sig Start Date End Date Taking? Authorizing Provider  acyclovir (ZOVIRAX) 200 MG capsule Take 200 mg by mouth daily as needed (for break out). 08/17/21   [provider]  albuterol (PROVENTIL) (2.5 MG/3ML) 0.083% nebulizer solution Take 3 mLs (2.5 mg total) by nebulization every 6 (six) hours as needed for wheezing or shortness of breath. 08/27/21   Laurin Coder, MD  albuterol (VENTOLIN HFA) 108 (90 Base) MCG/ACT inhaler Inhale 2 puffs into the lungs every 6 (six) hours as needed for wheezing. 10/15/21   Dorna Mai, MD  amLODipine (NORVASC) 5 MG tablet Take 1 tablet (5 mg total) by mouth daily. 08/03/22   Libby Maw, MD  buPROPion (WELLBUTRIN XL) 300 MG 24 hr tablet Take 1 tablet by mouth once daily 06/02/22   Libby Maw, MD  Cyanocobalamin (VITAMIN B 12 PO)  Take 1 tablet by mouth daily.    [provider]  fluticasone Asencion Islam) 50 MCG/ACT nasal spray Place 2 sprays into each nostril once a day 05/03/22   Libby Maw, MD  Fluticasone-Umeclidin-Vilant (TRELEGY ELLIPTA) 100-62.5-25 MCG/ACT AEPB Inhale 1 puff into the lungs daily. INHALE 1 PUFF ONCE DAILY 01/10/22   Sherrilyn Rist A, MD  gabapentin (NEURONTIN) 300 MG capsule TAKE 1 CAPSULE BY MOUTH IN THE MORNING AND 1 IN THE EVENING AND 2 AT BEDTIME 07/17/22   Libby Maw, MD  hydrocortisone (ANUSOL-HC) 2.5 % rectal cream Insert and apply rectally 2 times a day for 10 days Patient taking differently: Place 1 Application rectally daily as needed for hemorrhoids. 08/31/21     ibuprofen (ADVIL) 800 MG tablet Take 800 mg by mouth every 8 (eight) hours as needed.    [provider]  losartan (COZAAR) 100 MG tablet Take 1 tablet by mouth once daily Patient not taking: Reported on 08/12/2022 06/02/22   Libby Maw, MD  metFORMIN (GLUCOPHAGE-XR) 500 MG 24 hr tablet Take 1 tablet (500 mg total) by mouth daily with breakfast. 08/03/22   Libby Maw, MD  montelukast (SINGULAIR) 10 MG tablet Take 1 tablet (10 mg total) by mouth at bedtime. 04/04/22   Libby Maw, MD  Multiple Vitamin (MULTIVITAMIN WITH MINERALS) TABS tablet Take 1 tablet by mouth daily.    [provider]  Omega-3 Fatty Acids (  FISH OIL) 1000 MG CPDR Take 1,000 mg by mouth daily.    [provider]  omeprazole (PRILOSEC) 40 MG capsule Take 1 capsule (40 mg total) by mouth daily. 05/03/22   Libby Maw, MD  oxyCODONE-acetaminophen (PERCOCET) 5-325 MG tablet 1-2 tabs po q6 hours prn pain 08/16/22   Leanora Cover, MD  polyethylene glycol powder (GLYCOLAX/MIRALAX) 17 GM/SCOOP powder Dissolve 1 scoop of medication in water daily as needed for constipation. 12/22/21   Libby Maw, MD  QUEtiapine (SEROQUEL) 50 MG tablet Take 1 tablet (50 mg total) by  mouth at bedtime. 08/03/22   Libby Maw, MD  rosuvastatin (CRESTOR) 40 MG tablet TAKE 1/2 (ONE-HALF) TABLET BY MOUTH AT BEDTIME Strength: 40 mg 04/04/22   Libby Maw, MD  sertraline (ZOLOFT) 100 MG tablet Take 2 tablets (200 mg total) by mouth daily. 08/16/22   Libby Maw, MD      Allergies    Penicillins, Lisinopril, Lovastatin, and Other    Review of Systems   Review of Systems  All other systems reviewed and are negative.   Physical Exam Updated Vital Signs There were no vitals taken for this visit. Physical Exam Vitals and nursing note reviewed.  Constitutional:      General: She is not in acute distress.    Appearance: Normal appearance. She is well-developed.  HENT:     Head: Normocephalic and atraumatic.  Eyes:     Conjunctiva/sclera: Conjunctivae normal.     Pupils: Pupils are equal, round, and reactive to light.  Cardiovascular:     Rate and Rhythm: Normal rate and regular rhythm.     Heart sounds: Normal heart sounds.  Pulmonary:     Effort: Pulmonary effort is normal. No respiratory distress.     Breath sounds: Normal breath sounds.  Abdominal:     General: There is no distension.     Palpations: Abdomen is soft.     Tenderness: There is no abdominal tenderness.  Musculoskeletal:        General: No deformity. Normal range of motion.     Cervical back: Normal range of motion and neck supple.  Skin:    General: Skin is warm and dry.     Comments: Superficial abrasions noted to right elbow, right anterior knee, and distal tip of right ring finger.  Neurological:     General: No focal deficit present.     Mental Status: She is alert and oriented to person, place, and time. Mental status is at baseline.     Cranial Nerves: No cranial nerve deficit.     Sensory: No sensory deficit.     ED Results / Procedures / Treatments   Labs (all labs ordered are listed, but only abnormal results are displayed) Labs Reviewed - No data to  display  EKG None  Radiology No results found.  Procedures Procedures    Medications Ordered in ED Medications - No data to display  ED Course/ Medical Decision Making/ A&P                             Medical Decision Making Amount and/or Complexity of Data Reviewed Labs: ordered. Radiology: ordered.    Medical Screen Complete  This patient presented to the ED with complaint of fall.  This complaint involves an extensive number of treatment options. The initial differential diagnosis includes, but is not limited to, trauma related to fall, metabolic abnormality, etc.  This presentation is: Acute, Chronic, Self-Limited, Previously Undiagnosed, Uncertain Prognosis, Complicated, Systemic Symptoms, and Threat to Life/Bodily Function  Patient is presenting after fall.  Patient has self-reported history of frequent falls.  Exam is not strongly suggestive of significant traumatic injury.  Obtained imaging is without significant abnormality.  Screening labs obtained are also without significant abnormality.  After ED evaluation patient is feeling improved.  She desires discharge home.  Patient does understand need for close outpatient follow-up.  Strict return precautions given and understood.  Additional history obtained:  External records from outside sources obtained and reviewed including prior ED visits and prior Inpatient records.    Lab Tests:  I ordered and personally interpreted labs.  The pertinent results include: CBC, CMP, COVID, flu, UA, troponin x 2   Imaging Studies ordered:  I ordered imaging studies including CT head, plain films of right knee, chest, right elbow, and right ring finger I independently visualized and interpreted obtained imaging which showed nad I agree with the radiologist interpretation.   Cardiac Monitoring:  The patient was maintained on a cardiac monitor.  I personally viewed and interpreted the cardiac monitor which showed  an underlying rhythm of: NSR Problem List / ED Course:  Fall, skin abrasions   Reevaluation:  After the interventions noted above, I reevaluated the patient and found that they have: improved   Disposition:  After consideration of the diagnostic results and the patients response to treatment, I feel that the patent would benefit from close outpatient followup.          Final Clinical Impression(s) / ED Diagnoses Final diagnoses:  Fall, initial encounter  Abrasion    Rx / DC Orders ED Discharge Orders     None         Valarie Merino, MD 09/04/22 2307

## 2022-09-04 NOTE — ED Triage Notes (Addendum)
Pt BIB [facility] for c/o of weakness, pt reports having multiple falls today due to dizziness and chasing her dog. States that she hit her head x3 today. Pt has abrasions to her right knee, right elbow and fingers on right hand. Denies being on blood thinners. Pt has not eaten today and has just generally been feeling weaker than usual. EMS reported orthostatic changes, bp 126/64 when sitting and 102/67 when standing. Pt has brace on right wrist, has had previous surgeries on it.   CBG 113

## 2022-09-04 NOTE — Discharge Instructions (Signed)
Return for any problem.  ?

## 2022-09-04 NOTE — ED Notes (Signed)
Portable xrays at  the bedside  family at the bedside

## 2022-09-05 ENCOUNTER — Telehealth: Payer: Self-pay

## 2022-09-05 NOTE — Transitions of Care (Post Inpatient/ED Visit) (Signed)
   09/05/2022  Name: Tiffany Murphy MRN: [id number] DOB: 30-Oct-1950  Today's TOC FU Call Status: Today's TOC FU Call Status:: Unsuccessul Call (1st Attempt) Unsuccessful Call (1st Attempt) Date: 09/05/22  Attempted to reach the patient regarding the most recent Inpatient/ED visit.  Follow Up Plan: Additional outreach attempts will be made to reach the patient to complete the Transitions of Care (Post Inpatient/ED visit) call.   Signature  Angeline Slim, BSN, Therapist, sports

## 2022-09-06 NOTE — Telephone Encounter (Signed)
Error

## 2022-09-06 NOTE — Transitions of Care (Post Inpatient/ED Visit) (Signed)
   09/06/2022  Name: Cerah Rychlik MRN: [id number] DOB: 11/04/1950  Today's TOC FU Call Status: Today's TOC FU Call Status:: Successful TOC FU Call Competed Unsuccessful Call (1st Attempt) Date: 09/05/22 [facility] FU Call Complete Date: 09/06/22  Transition Care Management Follow-up Telephone Call Date of Discharge: 09/04/22 Discharge Facility: [facility]) Type of Discharge: Emergency Department Reason for ED Visit: Other: (fall) How have you been since you were released from the hospital?: Better Any questions or concerns?: No  Items Reviewed: Did you receive and understand the discharge instructions provided?: Yes Medications obtained and verified?: No Any new allergies since your discharge?: No Dietary orders reviewed?: NA Do you have support at home?: Yes  Home Care and Equipment/Supplies: [facility] Ordered?: NA Any new equipment or medical supplies ordered?: NA  Functional Questionnaire: Do you need assistance with bathing/showering or dressing?: No Do you need assistance with meal preparation?: No Do you need assistance with eating?: No Do you have difficulty maintaining continence: No Do you need assistance with getting out of bed/getting out of a chair/moving?: No Do you have difficulty managing or taking your medications?: No  Follow up appointments reviewed: PCP Follow-up appointment confirmed?: [facility] Follow-up appointment confirmed?: NA Do you need transportation to your follow-up appointment?: No Do you understand care options if your condition(s) worsen?: Yes-patient verbalized understanding    SIGNATURE Angeline Slim, BSN, RN

## 2022-09-20 ENCOUNTER — Other Ambulatory Visit: Payer: Self-pay | Admitting: Family Medicine

## 2022-09-20 DIAGNOSIS — J3089 Other allergic rhinitis: Secondary | ICD-10-CM

## 2022-09-20 DIAGNOSIS — J45909 Unspecified asthma, uncomplicated: Secondary | ICD-10-CM

## 2022-09-28 ENCOUNTER — Ambulatory Visit (INDEPENDENT_AMBULATORY_CARE_PROVIDER_SITE_OTHER): Payer: Medicare HMO | Admitting: Family Medicine

## 2022-09-28 ENCOUNTER — Encounter: Payer: Self-pay | Admitting: Family Medicine

## 2022-09-28 VITALS — BP 156/80 | HR 59 | Temp 98.3°F | Ht 62.0 in | Wt 173.8 lb

## 2022-09-28 DIAGNOSIS — R7303 Prediabetes: Secondary | ICD-10-CM

## 2022-09-28 DIAGNOSIS — F17200 Nicotine dependence, unspecified, uncomplicated: Secondary | ICD-10-CM | POA: Diagnosis not present

## 2022-09-28 DIAGNOSIS — F418 Other specified anxiety disorders: Secondary | ICD-10-CM | POA: Diagnosis not present

## 2022-09-28 DIAGNOSIS — I10 Essential (primary) hypertension: Secondary | ICD-10-CM

## 2022-09-28 DIAGNOSIS — F439 Reaction to severe stress, unspecified: Secondary | ICD-10-CM | POA: Diagnosis not present

## 2022-09-28 NOTE — Progress Notes (Signed)
Established Patient Office Visit   Subjective:  Patient ID: Tiffany Murphy, female    DOB: 1951/01/26  Age: 72 y.o. MRN: [id number]  Chief Complaint  Patient presents with   Medical Management of Chronic Issues    2 month follow up on depression and anxiety.     HPI Encounter Diagnoses  Name Primary?   Prediabetes Yes   Stress at home    Tobacco use disorder    Depression with anxiety    Essential hypertension    For follow-up of above.  Depression with anxiety is improving status post increasing Seroquel.  Continues with the higher dose of sertraline and Wellbutrin.  She is tolerating the metformin well.  Blood pressure has been lower at other visits.  Husband seems to be doing better with his drinking which is encouraging because his father just passed.  She quit smoking 3 weeks ago!   Review of Systems  Constitutional: Negative.   HENT: Negative.    Eyes:  Negative for blurred vision, discharge and redness.  Respiratory: Negative.    Cardiovascular: Negative.   Gastrointestinal:  Negative for abdominal pain.  Genitourinary: Negative.   Musculoskeletal: Negative.  Negative for myalgias.  Skin:  Negative for rash.  Neurological:  Negative for tingling, loss of consciousness and weakness.  Endo/Heme/Allergies:  Negative for polydipsia.      09/28/2022   11:22 AM 08/03/2022   10:34 AM 05/10/2022    1:43 PM  Depression screen PHQ 2/9  Decreased Interest 0 0 0  Down, Depressed, Hopeless 1 0 3  PHQ - 2 Score 1 0 3  Altered sleeping   0  Tired, decreased energy   3  Change in appetite   0  Feeling bad or failure about yourself    1  Trouble concentrating   0  Moving slowly or fidgety/restless   0  Suicidal thoughts   0  PHQ-9 Score   7  Difficult doing work/chores   Very difficult      Current Outpatient Medications:    acyclovir (ZOVIRAX) 200 MG capsule, Take 200 mg by mouth daily as needed (for break out)., Disp: , Rfl:    albuterol (PROVENTIL) (2.5 MG/3ML)  0.083% nebulizer solution, Take 3 mLs (2.5 mg total) by nebulization every 6 (six) hours as needed for wheezing or shortness of breath., Disp: 75 mL, Rfl: 6   albuterol (VENTOLIN HFA) 108 (90 Base) MCG/ACT inhaler, Inhale 2 puffs into the lungs every 6 (six) hours as needed for wheezing., Disp: 1 each, Rfl: 0   amLODipine (NORVASC) 5 MG tablet, Take 1 tablet (5 mg total) by mouth daily., Disp: 90 tablet, Rfl: 0   buPROPion (WELLBUTRIN XL) 300 MG 24 hr tablet, Take 1 tablet by mouth once daily, Disp: 90 tablet, Rfl: 1   Cyanocobalamin (VITAMIN B 12 PO), Take 1 tablet by mouth daily., Disp: , Rfl:    fluticasone (FLONASE) 50 MCG/ACT nasal spray, Place 2 sprays into each nostril once a day, Disp: 16 g, Rfl: 1   Fluticasone-Umeclidin-Vilant (TRELEGY ELLIPTA) 100-62.5-25 MCG/ACT AEPB, Inhale 1 puff into the lungs daily. INHALE 1 PUFF ONCE DAILY, Disp: 60 each, Rfl: 6   gabapentin (NEURONTIN) 300 MG capsule, TAKE 1 CAPSULE BY MOUTH IN THE MORNING AND 1 IN THE EVENING AND 2 AT BEDTIME, Disp: 360 capsule, Rfl: 0   hydrocortisone (ANUSOL-HC) 2.5 % rectal cream, Insert and apply rectally 2 times a day for 10 days (Patient taking differently: Place 1 Application rectally daily as needed  for hemorrhoids.), Disp: 30 g, Rfl: 2   metFORMIN (GLUCOPHAGE-XR) 500 MG 24 hr tablet, Take 1 tablet (500 mg total) by mouth daily with breakfast., Disp: 90 tablet, Rfl: 0   montelukast (SINGULAIR) 10 MG tablet, Take 1 tablet (10 mg total) by mouth at bedtime., Disp: 30 tablet, Rfl: 0   Multiple Vitamin (MULTIVITAMIN WITH MINERALS) TABS tablet, Take 1 tablet by mouth daily., Disp: , Rfl:    Omega-3 Fatty Acids (FISH OIL) 1000 MG CPDR, Take 1,000 mg by mouth daily., Disp: , Rfl:    omeprazole (PRILOSEC) 40 MG capsule, Take 1 capsule (40 mg total) by mouth daily., Disp: 90 capsule, Rfl: 1   polyethylene glycol powder (GLYCOLAX/MIRALAX) 17 GM/SCOOP powder, Dissolve 1 scoop of medication in water daily as needed for constipation.,  Disp: 850 g, Rfl: 1   QUEtiapine (SEROQUEL) 50 MG tablet, Take 1 tablet (50 mg total) by mouth at bedtime., Disp: 30 tablet, Rfl: 1   rosuvastatin (CRESTOR) 40 MG tablet, TAKE 1/2 (ONE-HALF) TABLET BY MOUTH AT BEDTIME Strength: 40 mg, Disp: 45 tablet, Rfl: 0   sertraline (ZOLOFT) 100 MG tablet, Take 2 tablets (200 mg total) by mouth daily., Disp: 60 tablet, Rfl: 2   ibuprofen (ADVIL) 800 MG tablet, Take 800 mg by mouth every 8 (eight) hours as needed. (Patient not taking: Reported on 09/28/2022), Disp: , Rfl:    losartan (COZAAR) 100 MG tablet, Take 1 tablet by mouth once daily (Patient not taking: Reported on 08/12/2022), Disp: 30 tablet, Rfl: 0   oxyCODONE-acetaminophen (PERCOCET) 5-325 MG tablet, 1-2 tabs po q6 hours prn pain (Patient not taking: Reported on 09/28/2022), Disp: 20 tablet, Rfl: 0   Objective:     BP (!) 156/80 (BP Location: Left Arm, Patient Position: Sitting, Cuff Size: Normal)   Pulse (!) 59   Temp 98.3 F (36.8 C) (Temporal)   Ht 5\' 2"  (1.575 m)   Wt 173 lb 12.8 oz (78.8 kg)   SpO2 94%   BMI 31.79 kg/m  BP Readings from Last 3 Encounters:  09/28/22 (!) 156/80  09/04/22 123/68  08/16/22 (!) 150/65      Physical Exam Constitutional:      General: She is not in acute distress.    Appearance: Normal appearance. She is not ill-appearing, toxic-appearing or diaphoretic.  HENT:     Head: Normocephalic and atraumatic.     Right Ear: External ear normal.     Left Ear: External ear normal.  Eyes:     General: No scleral icterus.       Right eye: No discharge.        Left eye: No discharge.     Extraocular Movements: Extraocular movements intact.     Conjunctiva/sclera: Conjunctivae normal.  Pulmonary:     Effort: Pulmonary effort is normal. No respiratory distress.  Skin:    General: Skin is warm and dry.  Neurological:     Mental Status: She is alert and oriented to person, place, and time.  Psychiatric:        Mood and Affect: Mood normal.        Behavior:  Behavior normal.      No results found for any visits on 09/28/22.    The 10-year ASCVD risk score (Arnett DK, et al., 2019) is: 54.5%    Assessment & Plan:   Prediabetes  Stress at home  Tobacco use disorder  Depression with anxiety  Essential hypertension    Return in about 8 weeks (around 11/23/2022).  Continue current medications.  Congratulated her on quitting smoking course.  Advised that she try lozenges for any cravings that might pop  Mliss SaxWilliam Alfred Aloysious Vangieson, MD

## 2022-10-05 ENCOUNTER — Other Ambulatory Visit: Payer: Self-pay | Admitting: Family Medicine

## 2022-10-05 DIAGNOSIS — F439 Reaction to severe stress, unspecified: Secondary | ICD-10-CM

## 2022-10-05 DIAGNOSIS — F418 Other specified anxiety disorders: Secondary | ICD-10-CM

## 2022-10-22 ENCOUNTER — Other Ambulatory Visit: Payer: Self-pay | Admitting: Family Medicine

## 2022-10-22 DIAGNOSIS — G8929 Other chronic pain: Secondary | ICD-10-CM

## 2022-10-22 DIAGNOSIS — G894 Chronic pain syndrome: Secondary | ICD-10-CM

## 2022-10-22 DIAGNOSIS — K219 Gastro-esophageal reflux disease without esophagitis: Secondary | ICD-10-CM

## 2022-11-08 ENCOUNTER — Other Ambulatory Visit: Payer: Self-pay | Admitting: Family Medicine

## 2022-11-08 DIAGNOSIS — F418 Other specified anxiety disorders: Secondary | ICD-10-CM

## 2022-11-23 ENCOUNTER — Telehealth: Payer: Self-pay | Admitting: Family Medicine

## 2022-11-23 ENCOUNTER — Ambulatory Visit: Payer: Medicare HMO | Admitting: Family Medicine

## 2022-11-23 NOTE — Telephone Encounter (Signed)
6.5.24 no show letter sent

## 2022-11-24 NOTE — Telephone Encounter (Signed)
2nd no show w/in 12 months, fee generated, final warning sent via mail and mychart   No show 04/26/22 and 11/23/22

## 2022-12-08 ENCOUNTER — Ambulatory Visit (INDEPENDENT_AMBULATORY_CARE_PROVIDER_SITE_OTHER): Payer: Medicare HMO | Admitting: Family Medicine

## 2022-12-08 ENCOUNTER — Encounter: Payer: Self-pay | Admitting: Family Medicine

## 2022-12-08 VITALS — BP 138/74 | HR 62 | Temp 98.8°F | Ht 62.0 in | Wt 176.4 lb

## 2022-12-08 DIAGNOSIS — J4489 Other specified chronic obstructive pulmonary disease: Secondary | ICD-10-CM | POA: Diagnosis not present

## 2022-12-08 DIAGNOSIS — I10 Essential (primary) hypertension: Secondary | ICD-10-CM | POA: Diagnosis not present

## 2022-12-08 DIAGNOSIS — E785 Hyperlipidemia, unspecified: Secondary | ICD-10-CM

## 2022-12-08 DIAGNOSIS — R7303 Prediabetes: Secondary | ICD-10-CM | POA: Diagnosis not present

## 2022-12-08 DIAGNOSIS — R911 Solitary pulmonary nodule: Secondary | ICD-10-CM

## 2022-12-08 DIAGNOSIS — F439 Reaction to severe stress, unspecified: Secondary | ICD-10-CM

## 2022-12-08 DIAGNOSIS — M7021 Olecranon bursitis, right elbow: Secondary | ICD-10-CM

## 2022-12-08 LAB — URINALYSIS, ROUTINE W REFLEX MICROSCOPIC
Bilirubin Urine: NEGATIVE
Hgb urine dipstick: NEGATIVE
Ketones, ur: NEGATIVE
Leukocytes,Ua: NEGATIVE
Nitrite: NEGATIVE
Specific Gravity, Urine: 1.02 (ref 1.000–1.030)
Total Protein, Urine: NEGATIVE
Urine Glucose: NEGATIVE
Urobilinogen, UA: 0.2 (ref 0.0–1.0)
pH: 6.5 (ref 5.0–8.0)

## 2022-12-08 LAB — COMPREHENSIVE METABOLIC PANEL
ALT: 14 U/L (ref 0–35)
AST: 17 U/L (ref 0–37)
Albumin: 4.1 g/dL (ref 3.5–5.2)
Alkaline Phosphatase: 69 U/L (ref 39–117)
BUN: 14 mg/dL (ref 6–23)
CO2: 25 mEq/L (ref 19–32)
Calcium: 8.9 mg/dL (ref 8.4–10.5)
Chloride: 105 mEq/L (ref 96–112)
Creatinine, Ser: 0.97 mg/dL (ref 0.40–1.20)
GFR: 57.98 mL/min — ABNORMAL LOW (ref >60.00)
Glucose, Bld: 72 mg/dL (ref 70–99)
Potassium: 4.2 mEq/L (ref 3.5–5.1)
Sodium: 141 mEq/L (ref 135–145)
Total Bilirubin: 0.5 mg/dL (ref 0.2–1.2)
Total Protein: 6.5 g/dL (ref 6.0–8.3)

## 2022-12-08 LAB — CBC
HCT: 40.3 % (ref 36.0–46.0)
Hemoglobin: 13.1 g/dL (ref 12.0–15.0)
MCHC: 32.6 g/dL (ref 30.0–36.0)
MCV: 91.3 fl (ref 78.0–100.0)
Platelets: 194 10*3/uL (ref 150.0–400.0)
RBC: 4.41 Mil/uL (ref 3.87–5.11)
RDW: 14.8 % (ref 11.5–15.5)
WBC: 4.6 10*3/uL (ref 4.0–10.5)

## 2022-12-08 LAB — LIPID PANEL
Cholesterol: 129 mg/dL (ref 0–200)
HDL: 48.2 mg/dL (ref >39.00)
LDL Cholesterol: 45 mg/dL (ref 0–99)
NonHDL: 80.91
Total CHOL/HDL Ratio: 3
Triglycerides: 182 mg/dL — ABNORMAL HIGH (ref 0.0–149.0)
VLDL: 36.4 mg/dL (ref 0.0–40.0)

## 2022-12-08 LAB — MICROALBUMIN / CREATININE URINE RATIO
Creatinine,U: 122.9 mg/dL
Microalb Creat Ratio: 0.6 mg/g (ref 0.0–30.0)
Microalb, Ur: <0.7 mg/dL (ref 0.0–1.9)

## 2022-12-08 LAB — HEMOGLOBIN A1C: Hgb A1c MFr Bld: 5.9 % (ref 4.6–6.5)

## 2022-12-08 NOTE — Progress Notes (Signed)
Established Patient Office Visit   Subjective:  Patient ID: Tiffany Murphy, female    DOB: 08-21-1950  Age: 72 y.o. MRN: [id number]  Chief Complaint  Patient presents with   Follow-up    Follow up. Pt concern with the a mass on her right elbow. Pt requested drainage today if possible.     HPI Encounter Diagnoses  Name Primary?   Prediabetes Yes   Essential hypertension    Hyperlipidemia with target LDL less than 70    COPD with asthma    Olecranon bursitis of right elbow    Lung nodule seen on imaging study    Stress at home    For follow-up of above.  Continues medications as indicated below.  Husband continues to drink alcohol weekly and may be facing incarceration.  She continues to abstain from tobacco use.  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  No recent follow-up.  Developed fluid sac on the right elbow.  She is right hand dominant.   Review of Systems  Constitutional: Negative.   HENT: Negative.    Eyes:  Negative for blurred vision, discharge and redness.  Respiratory: Negative.    Cardiovascular: Negative.   Gastrointestinal:  Negative for abdominal pain.  Genitourinary: Negative.   Musculoskeletal: Negative.  Negative for myalgias.  Skin:  Negative for rash.  Neurological:  Negative for tingling, loss of consciousness and weakness.  Endo/Heme/Allergies:  Negative for polydipsia.     Current Outpatient Medications:    acyclovir (ZOVIRAX) 200 MG capsule, Take 200 mg by mouth daily as needed (for break out)., Disp: , Rfl:    albuterol (PROVENTIL) (2.5 MG/3ML) 0.083% nebulizer solution, Take 3 mLs (2.5 mg total) by nebulization every 6 (six) hours as needed for wheezing or shortness of breath., Disp: 75 mL, Rfl: 6   albuterol (VENTOLIN HFA) 108 (90 Base) MCG/ACT inhaler, Inhale 2 puffs into the lungs every 6 (six) hours as needed for wheezing., Disp: 1 each, Rfl: 0   amLODipine (NORVASC) 5 MG tablet,  Take 1 tablet (5 mg total) by mouth daily., Disp: 90 tablet, Rfl: 0   buPROPion (WELLBUTRIN XL) 300 MG 24 hr tablet, Take 1 tablet by mouth once daily, Disp: 90 tablet, Rfl: 1   Cyanocobalamin (VITAMIN B 12 PO), Take 1 tablet by mouth daily., Disp: , Rfl:    fluticasone (FLONASE) 50 MCG/ACT nasal spray, Place 2 sprays into each nostril once a day, Disp: 16 g, Rfl: 1   Fluticasone-Umeclidin-Vilant (TRELEGY ELLIPTA) 100-62.5-25 MCG/ACT AEPB, Inhale 1 puff into the lungs daily. INHALE 1 PUFF ONCE DAILY, Disp: 60 each, Rfl: 6   gabapentin (NEURONTIN) 300 MG capsule, TAKE 1 CAPSULE BY MOUTH IN THE MORNING AND 1 IN THE EVENING AND 2 AT BEDTIME, Disp: 360 capsule, Rfl: 0   hydrocortisone (ANUSOL-HC) 2.5 % rectal cream, Insert and apply rectally 2 times a day for 10 days (Patient taking differently: Place 1 Application rectally daily as needed for hemorrhoids.), Disp: 30 g, Rfl: 2   losartan (COZAAR) 100 MG tablet, Take 1 tablet by mouth once daily, Disp: 30 tablet, Rfl: 0   metFORMIN (GLUCOPHAGE-XR) 500 MG 24 hr tablet, Take 1 tablet (500 mg total) by mouth daily with breakfast., Disp: 90 tablet, Rfl: 0   montelukast (SINGULAIR) 10 MG tablet, Take 1 tablet (10 mg total) by mouth at bedtime., Disp: 30 tablet, Rfl: 0   Multiple Vitamin (MULTIVITAMIN WITH MINERALS) TABS tablet, Take  1 tablet by mouth daily., Disp: , Rfl:    Omega-3 Fatty Acids (FISH OIL) 1000 MG CPDR, Take 1,000 mg by mouth daily., Disp: , Rfl:    omeprazole (PRILOSEC) 40 MG capsule, Take 1 capsule by mouth once daily, Disp: 90 capsule, Rfl: 0   polyethylene glycol powder (GLYCOLAX/MIRALAX) 17 GM/SCOOP powder, Dissolve 1 scoop of medication in water daily as needed for constipation., Disp: 850 g, Rfl: 1   QUEtiapine (SEROQUEL) 50 MG tablet, TAKE 1 TABLET BY MOUTH AT BEDTIME, Disp: 90 tablet, Rfl: 0   rosuvastatin (CRESTOR) 40 MG tablet, TAKE 1/2 (ONE-HALF) TABLET BY MOUTH AT BEDTIME Strength: 40 mg, Disp: 45 tablet, Rfl: 0   sertraline  (ZOLOFT) 100 MG tablet, Take 2 tablets by mouth once daily, Disp: 180 tablet, Rfl: 0   oxyCODONE-acetaminophen (PERCOCET) 5-325 MG tablet, 1-2 tabs po q6 hours prn pain (Patient not taking: Reported on 09/28/2022), Disp: 20 tablet, Rfl: 0   Objective:     BP 138/74   Pulse 62   Temp 98.8 F (37.1 C)   Ht 5\' 2"  (1.575 m)   Wt 176 lb 6.4 oz (80 kg)   BMI 32.26 kg/m    Physical Exam Constitutional:      General: She is not in acute distress.    Appearance: Normal appearance. She is not ill-appearing, toxic-appearing or diaphoretic.  HENT:     Head: Normocephalic and atraumatic.     Right Ear: External ear normal.     Left Ear: External ear normal.  Eyes:     General: No scleral icterus.       Right eye: No discharge.        Left eye: No discharge.     Extraocular Movements: Extraocular movements intact.     Conjunctiva/sclera: Conjunctivae normal.  Cardiovascular:     Rate and Rhythm: Normal rate and regular rhythm.  Pulmonary:     Effort: Pulmonary effort is normal. No respiratory distress.     Breath sounds: Normal breath sounds. Decreased air movement present.  Abdominal:     General: Bowel sounds are normal.     Tenderness: There is no abdominal tenderness. There is no guarding.  Musculoskeletal:     Right upper arm: No swelling.     Right elbow: Effusion present. Normal range of motion. No tenderness.       Arms:     Cervical back: No rigidity or tenderness.  Skin:    General: Skin is warm and dry.  Neurological:     Mental Status: She is alert and oriented to person, place, and time.  Psychiatric:        Mood and Affect: Mood normal.        Behavior: Behavior normal.      No results found for any visits on 12/08/22.    The 10-year ASCVD risk score (Arnett DK, et al., 2019) is: 46%    Assessment & Plan:   Prediabetes -     Comprehensive metabolic panel -     Hemoglobin A1c -     Urinalysis, Routine w reflex microscopic -     Microalbumin /  creatinine urine ratio  Essential hypertension -     CBC -     Comprehensive metabolic panel -     Urinalysis, Routine w reflex microscopic -     Microalbumin / creatinine urine ratio  Hyperlipidemia with target LDL less than 70 -     Comprehensive metabolic panel -  Lipid panel  COPD with asthma -     Ambulatory referral to Pulmonology  Olecranon bursitis of right elbow  Lung nodule seen on imaging study -     Ambulatory referral to Pulmonology  Stress at home    Return in about 3 months (around 03/10/2023).  Will use elastic elbow sleeve for a month.  If fluid sac does not resolve she will let me know and I will refer her to orthopedics.  Continue all medications as indicated above.  Adjustments made pending results of today's labs.  Mliss Sax, MD

## 2022-12-09 ENCOUNTER — Other Ambulatory Visit: Payer: Self-pay

## 2022-12-09 DIAGNOSIS — G8929 Other chronic pain: Secondary | ICD-10-CM

## 2022-12-09 DIAGNOSIS — E785 Hyperlipidemia, unspecified: Secondary | ICD-10-CM

## 2022-12-09 DIAGNOSIS — F439 Reaction to severe stress, unspecified: Secondary | ICD-10-CM

## 2022-12-09 DIAGNOSIS — J309 Allergic rhinitis, unspecified: Secondary | ICD-10-CM

## 2022-12-09 DIAGNOSIS — I10 Essential (primary) hypertension: Secondary | ICD-10-CM

## 2022-12-09 DIAGNOSIS — R7303 Prediabetes: Secondary | ICD-10-CM

## 2022-12-09 DIAGNOSIS — G894 Chronic pain syndrome: Secondary | ICD-10-CM

## 2022-12-09 DIAGNOSIS — K219 Gastro-esophageal reflux disease without esophagitis: Secondary | ICD-10-CM

## 2022-12-09 DIAGNOSIS — F418 Other specified anxiety disorders: Secondary | ICD-10-CM

## 2022-12-09 MED ORDER — QUETIAPINE FUMARATE 50 MG PO TABS: 50.0000 mg | ORAL_TABLET | Freq: Every day | ORAL | 0 refills | Status: DC

## 2022-12-09 MED ORDER — OMEPRAZOLE 40 MG PO CPDR: 40.0000 mg | DELAYED_RELEASE_CAPSULE | Freq: Every day | ORAL | 0 refills | Status: DC

## 2022-12-09 MED ORDER — GABAPENTIN 300 MG PO CAPS: ORAL_CAPSULE | ORAL | 0 refills | Status: DC

## 2022-12-09 MED ORDER — BUPROPION HCL ER (XL) 300 MG PO TB24: 300.0000 mg | ORAL_TABLET | Freq: Every day | ORAL | 1 refills | Status: DC

## 2022-12-09 MED ORDER — SERTRALINE HCL 100 MG PO TABS: 200.0000 mg | ORAL_TABLET | Freq: Every day | ORAL | 0 refills | Status: DC

## 2022-12-09 MED ORDER — FLUTICASONE PROPIONATE 50 MCG/ACT NA SUSP: NASAL | 1 refills | Status: AC

## 2022-12-09 MED ORDER — ALBUTEROL SULFATE (2.5 MG/3ML) 0.083% IN NEBU: 2.5000 mg | INHALATION_SOLUTION | Freq: Four times a day (QID) | RESPIRATORY_TRACT | 6 refills | Status: AC | PRN

## 2022-12-09 MED ORDER — ROSUVASTATIN CALCIUM 40 MG PO TABS: ORAL_TABLET | ORAL | 0 refills | Status: DC

## 2022-12-09 MED ORDER — AMLODIPINE BESYLATE 5 MG PO TABS: 5.0000 mg | ORAL_TABLET | Freq: Every day | ORAL | 0 refills | Status: DC

## 2022-12-09 MED ORDER — METFORMIN HCL ER 500 MG PO TB24: 500.0000 mg | ORAL_TABLET | Freq: Every day | ORAL | 0 refills | Status: DC

## 2022-12-12 ENCOUNTER — Telehealth: Payer: Self-pay

## 2022-12-12 NOTE — Telephone Encounter (Signed)
Called to give pt lab results. Unable to LVM. [facility] message sent.

## 2022-12-13 ENCOUNTER — Other Ambulatory Visit: Payer: Self-pay | Admitting: Family Medicine

## 2022-12-13 DIAGNOSIS — Z1231 Encounter for screening mammogram for malignant neoplasm of breast: Secondary | ICD-10-CM

## 2022-12-30 ENCOUNTER — Other Ambulatory Visit: Payer: Self-pay | Admitting: Family Medicine

## 2022-12-30 DIAGNOSIS — G894 Chronic pain syndrome: Secondary | ICD-10-CM

## 2022-12-30 DIAGNOSIS — G8929 Other chronic pain: Secondary | ICD-10-CM

## 2023-01-02 ENCOUNTER — Other Ambulatory Visit: Payer: Medicare HMO

## 2023-01-02 ENCOUNTER — Ambulatory Visit: Admission: RE | Admit: 2023-01-02 | Payer: Medicare Other | Source: Ambulatory Visit

## 2023-01-02 DIAGNOSIS — Z1231 Encounter for screening mammogram for malignant neoplasm of breast: Secondary | ICD-10-CM | POA: Diagnosis not present

## 2023-01-30 ENCOUNTER — Other Ambulatory Visit: Payer: Self-pay | Admitting: Family Medicine

## 2023-01-30 DIAGNOSIS — J309 Allergic rhinitis, unspecified: Secondary | ICD-10-CM

## 2023-03-20 ENCOUNTER — Telehealth: Payer: Self-pay | Admitting: Family Medicine

## 2023-03-20 NOTE — Telephone Encounter (Signed)
PAPERWORK/FORMS received  Dropped off by: pt  Call back #: [phone]  Individual made aware of 3-5 business day turn around (YES/NO): yes GREEN charge sheet completed and patient made aware of possible charge (YES/NO): yes Placed in provider folder at front desk. ~~~ route to CMA/provider Team SHE HAS TO HAVE THIS DONE BY TODAY OR HE INSURANCE WILL BE CANCELLED   CLINICAL USE BELOW THIS LINE (use X to signify action taken)  ___ Form received and placed in providers office for signature. ___ Form completed and faxed to LOA Dept.  ___ Form completed & LVM to notify patient ready for pick up.  ___ Charge sheet and copy of form in front office folder for office supervisor.

## 2023-03-21 ENCOUNTER — Other Ambulatory Visit: Payer: Self-pay | Admitting: Family Medicine

## 2023-03-21 DIAGNOSIS — I10 Essential (primary) hypertension: Secondary | ICD-10-CM

## 2023-03-21 DIAGNOSIS — E785 Hyperlipidemia, unspecified: Secondary | ICD-10-CM

## 2023-03-27 ENCOUNTER — Other Ambulatory Visit: Payer: Self-pay

## 2023-03-27 ENCOUNTER — Other Ambulatory Visit: Payer: Self-pay | Admitting: Family Medicine

## 2023-03-27 ENCOUNTER — Telehealth: Payer: Self-pay | Admitting: Family Medicine

## 2023-03-27 DIAGNOSIS — F418 Other specified anxiety disorders: Secondary | ICD-10-CM

## 2023-03-27 DIAGNOSIS — F439 Reaction to severe stress, unspecified: Secondary | ICD-10-CM

## 2023-03-27 MED ORDER — QUETIAPINE FUMARATE 50 MG PO TABS
50.0000 mg | ORAL_TABLET | Freq: Every day | ORAL | 0 refills | Status: DC
Start: 2023-03-27 — End: 2023-06-08

## 2023-03-27 NOTE — Telephone Encounter (Signed)
Prescription Request  03/27/2023  LOV: 12/08/2022  What is the name of the medication or equipment?  QUEtiapine QUEtiapine (SEROQUEL) 50 MG tablet  Have you contacted your pharmacy to request a refill? No   Which pharmacy would you like this sent to?  [facility] [address] ([address] [place] - [address] [place]) [place] [address] Phone: [phone] Fax: [phone]   Patient notified that their request is being sent to the clinical staff for review and that they should receive a response within 2 business days.   Please advise at Mobile [phone] (mobile)

## 2023-03-28 ENCOUNTER — Telehealth: Payer: Self-pay | Admitting: Family Medicine

## 2023-03-28 ENCOUNTER — Other Ambulatory Visit: Payer: Self-pay | Admitting: Family Medicine

## 2023-03-28 DIAGNOSIS — R7303 Prediabetes: Secondary | ICD-10-CM

## 2023-03-28 NOTE — Telephone Encounter (Signed)
Please call the pt she said she missed your call

## 2023-03-29 ENCOUNTER — Ambulatory Visit: Payer: Medicare PPO | Admitting: Pulmonary Disease

## 2023-04-06 ENCOUNTER — Other Ambulatory Visit: Payer: Self-pay | Admitting: Family Medicine

## 2023-04-06 DIAGNOSIS — K219 Gastro-esophageal reflux disease without esophagitis: Secondary | ICD-10-CM

## 2023-04-13 ENCOUNTER — Telehealth: Payer: Self-pay | Admitting: Pulmonary Disease

## 2023-04-13 NOTE — Telephone Encounter (Signed)
PT is out of Trelegy. She has not been seen w/I the last year but has an appt. Can Dr. Drucilla Schmidt a courtesy refill to [facility] in [place].  Her # is [phone]

## 2023-04-13 NOTE — Telephone Encounter (Signed)
New number is [phone]. Disregard last #.

## 2023-04-13 NOTE — Telephone Encounter (Signed)
ATC patient- unable to leave vm due to mailbox being full.   

## 2023-04-15 ENCOUNTER — Other Ambulatory Visit: Payer: Self-pay | Admitting: Pulmonary Disease

## 2023-04-18 NOTE — Telephone Encounter (Signed)
Trelegy has been refilled nfn

## 2023-05-09 ENCOUNTER — Other Ambulatory Visit: Payer: Self-pay | Admitting: Family Medicine

## 2023-05-09 DIAGNOSIS — G894 Chronic pain syndrome: Secondary | ICD-10-CM

## 2023-05-09 DIAGNOSIS — G8929 Other chronic pain: Secondary | ICD-10-CM

## 2023-05-09 DIAGNOSIS — F418 Other specified anxiety disorders: Secondary | ICD-10-CM

## 2023-05-17 NOTE — Telephone Encounter (Signed)
Pt has appt scheduled with Dr Doreene Burke for a med check. She has two pills of Gabapetin and she has been out of Sertraline for a week. Please refil

## 2023-05-29 ENCOUNTER — Telehealth: Payer: Self-pay | Admitting: Family Medicine

## 2023-05-29 ENCOUNTER — Ambulatory Visit: Payer: Medicare HMO | Admitting: Family Medicine

## 2023-05-29 NOTE — Telephone Encounter (Signed)
Pt did not show up for appt today. Pls advise.

## 2023-05-30 NOTE — Telephone Encounter (Signed)
2nd no show w/in 12 months (11/23/2022 & 05/29/2023), final warning sent via mail and mychart, pt has rescheduled for 12/19

## 2023-06-08 ENCOUNTER — Other Ambulatory Visit: Payer: Self-pay

## 2023-06-08 ENCOUNTER — Encounter: Payer: Self-pay | Admitting: Family Medicine

## 2023-06-08 ENCOUNTER — Ambulatory Visit: Payer: Medicare HMO | Admitting: Family Medicine

## 2023-06-08 VITALS — BP 124/72 | HR 83 | Temp 98.3°F | Wt 175.8 lb

## 2023-06-08 DIAGNOSIS — E785 Hyperlipidemia, unspecified: Secondary | ICD-10-CM

## 2023-06-08 DIAGNOSIS — F439 Reaction to severe stress, unspecified: Secondary | ICD-10-CM | POA: Diagnosis not present

## 2023-06-08 DIAGNOSIS — F418 Other specified anxiety disorders: Secondary | ICD-10-CM

## 2023-06-08 MED ORDER — QUETIAPINE FUMARATE 50 MG PO TABS: 50.0000 mg | ORAL_TABLET | Freq: Every day | ORAL | 1 refills | Status: DC

## 2023-06-08 MED ORDER — SERTRALINE HCL 100 MG PO TABS
200.0000 mg | ORAL_TABLET | Freq: Every day | ORAL | 1 refills | Status: DC
Start: 2023-06-08 — End: 2023-08-14

## 2023-06-08 MED ORDER — BUPROPION HCL ER (XL) 300 MG PO TB24: 300.0000 mg | ORAL_TABLET | Freq: Every day | ORAL | 1 refills | Status: DC

## 2023-06-08 MED ORDER — ROSUVASTATIN CALCIUM 40 MG PO TABS
ORAL_TABLET | ORAL | 3 refills | Status: AC
Start: 2023-06-08 — End: ?

## 2023-06-08 NOTE — Telephone Encounter (Signed)
A user error has taken place: encounter opened in error, closed for administrative reasons.

## 2023-06-08 NOTE — Progress Notes (Signed)
Established Patient Office Visit   Subjective:  Patient ID: Tiffany Murphy, female    DOB: October 03, 1950  Age: 72 y.o. MRN: [id number]  Chief Complaint  Patient presents with   Medical Management of Chronic Issues    Rx refill seroquel and rosuvastatin to [facility].     HPI Encounter Diagnoses  Name Primary?   Depression with anxiety Yes   Hyperlipidemia with target LDL less than 70    Stress at home    For follow-up of above.  Continues to deal with anxiety and depression.  Husband is quit drinking but had a heart attack recently and she is worried about his health.  Asking for refill the Seroquel to help her sleep.  She has been out of the sertraline.  Continues to take Wellbutrin.   Review of Systems  Constitutional: Negative.   HENT: Negative.    Eyes:  Negative for blurred vision, discharge and redness.  Respiratory: Negative.    Cardiovascular: Negative.   Gastrointestinal:  Negative for abdominal pain.  Genitourinary: Negative.   Musculoskeletal: Negative.  Negative for myalgias.  Skin:  Negative for rash.  Neurological:  Negative for tingling, loss of consciousness and weakness.  Endo/Heme/Allergies:  Negative for polydipsia.  Psychiatric/Behavioral:  Positive for depression. The patient is nervous/anxious and has insomnia.       06/08/2023    3:21 PM 12/08/2022   11:28 AM 12/08/2022   11:06 AM  Depression screen PHQ 2/9  Decreased Interest 2 3 3   Down, Depressed, Hopeless 2 3 3   PHQ - 2 Score 4 6 6   Altered sleeping 1 0   Tired, decreased energy 2 2   Change in appetite 2 1   Feeling bad or failure about yourself  3 0   Trouble concentrating 0 3   Moving slowly or fidgety/restless 0 0   Suicidal thoughts 0 0   PHQ-9 Score 12 12   Difficult doing work/chores Very difficult Somewhat difficult       Current Outpatient Medications:    acyclovir (ZOVIRAX) 200 MG capsule, Take 200 mg by mouth daily as needed (for break out)., Disp: , Rfl:    albuterol  (PROVENTIL) (2.5 MG/3ML) 0.083% nebulizer solution, Take 3 mLs (2.5 mg total) by nebulization every 6 (six) hours as needed for wheezing or shortness of breath., Disp: 75 mL, Rfl: 6   albuterol (VENTOLIN HFA) 108 (90 Base) MCG/ACT inhaler, Inhale 2 puffs into the lungs every 6 (six) hours as needed for wheezing., Disp: 1 each, Rfl: 0   amLODipine (NORVASC) 5 MG tablet, TAKE 1 TABLET EVERY DAY, Disp: 90 tablet, Rfl: 3   Cyanocobalamin (VITAMIN B 12 PO), Take 1 tablet by mouth daily., Disp: , Rfl:    fluticasone (FLONASE) 50 MCG/ACT nasal spray, Place 2 sprays into each nostril once a day, Disp: 16 g, Rfl: 1   Fluticasone-Umeclidin-Vilant (TRELEGY ELLIPTA) 100-62.5-25 MCG/ACT AEPB, INHALE 1 PUFF INTO THE LUNGS ONCE DAILY, Disp: 60 each, Rfl: 3   gabapentin (NEURONTIN) 300 MG capsule, TAKE 1 CAPSULE BY MOUTH IN THE MORNING AND 1 IN THE EVENING AND 2 AT BEDTIME, Disp: 360 capsule, Rfl: 0   hydrocortisone (ANUSOL-HC) 2.5 % rectal cream, Insert and apply rectally 2 times a day for 10 days (Patient taking differently: Place 1 Application rectally daily as needed for hemorrhoids.), Disp: 30 g, Rfl: 2   losartan (COZAAR) 100 MG tablet, Take 1 tablet by mouth once daily, Disp: 30 tablet, Rfl: 0   metFORMIN (GLUCOPHAGE-XR) 500  MG 24 hr tablet, TAKE 1 TABLET EVERY DAY WITH BREAKFAST, Disp: 90 tablet, Rfl: 3   montelukast (SINGULAIR) 10 MG tablet, Take 1 tablet (10 mg total) by mouth at bedtime., Disp: 30 tablet, Rfl: 0   Multiple Vitamin (MULTIVITAMIN WITH MINERALS) TABS tablet, Take 1 tablet by mouth daily., Disp: , Rfl:    Omega-3 Fatty Acids (FISH OIL) 1000 MG CPDR, Take 1,000 mg by mouth daily., Disp: , Rfl:    omeprazole (PRILOSEC) 40 MG capsule, TAKE 1 CAPSULE EVERY DAY, Disp: 90 capsule, Rfl: 3   oxyCODONE-acetaminophen (PERCOCET) 5-325 MG tablet, 1-2 tabs po q6 hours prn pain, Disp: 20 tablet, Rfl: 0   polyethylene glycol powder (GLYCOLAX/MIRALAX) 17 GM/SCOOP powder, Dissolve 1 scoop of medication in  water daily as needed for constipation., Disp: 850 g, Rfl: 1   buPROPion (WELLBUTRIN XL) 300 MG 24 hr tablet, Take 1 tablet (300 mg total) by mouth daily., Disp: 90 tablet, Rfl: 1   QUEtiapine (SEROQUEL) 50 MG tablet, Take 1 tablet (50 mg total) by mouth at bedtime., Disp: 90 tablet, Rfl: 1   rosuvastatin (CRESTOR) 40 MG tablet, TAKE 1/2 TABLET AT BEDTIME, Disp: 45 tablet, Rfl: 3   sertraline (ZOLOFT) 100 MG tablet, Take 2 tablets (200 mg total) by mouth daily., Disp: 180 tablet, Rfl: 1   Objective:     BP 124/72   Pulse 83   Temp 98.3 F (36.8 C) (Temporal)   Wt 175 lb 12.8 oz (79.7 kg)   SpO2 94%   BMI 32.15 kg/m    Physical Exam Constitutional:      General: She is not in acute distress.    Appearance: Normal appearance. She is not ill-appearing, toxic-appearing or diaphoretic.  HENT:     Head: Normocephalic and atraumatic.     Right Ear: External ear normal.     Left Ear: External ear normal.  Eyes:     General: No scleral icterus.       Right eye: No discharge.        Left eye: No discharge.     Extraocular Movements: Extraocular movements intact.     Conjunctiva/sclera: Conjunctivae normal.  Cardiovascular:     Rate and Rhythm: Normal rate and regular rhythm.  Pulmonary:     Effort: Pulmonary effort is normal. No respiratory distress.     Breath sounds: Normal breath sounds.  Abdominal:     General: Bowel sounds are normal.  Musculoskeletal:     Cervical back: No rigidity or tenderness.  Skin:    General: Skin is warm and dry.  Neurological:     Mental Status: She is alert and oriented to person, place, and time.  Psychiatric:        Mood and Affect: Mood normal.        Behavior: Behavior normal.      No results found for any visits on 06/08/23.    The ASCVD Risk score (Arnett DK, et al., 2019) failed to calculate for the following reasons:   The valid total cholesterol range is 130 to 320 mg/dL    Assessment & Plan:   Depression with anxiety -      Sertraline HCl; Take 2 tablets (200 mg total) by mouth daily.  Dispense: 180 tablet; Refill: 1 -     QUEtiapine Fumarate; Take 1 tablet (50 mg total) by mouth at bedtime.  Dispense: 90 tablet; Refill: 1 -     buPROPion HCl ER (XL); Take 1 tablet (300 mg total) by mouth  daily.  Dispense: 90 tablet; Refill: 1 -     Ambulatory referral to Psychology  Hyperlipidemia with target LDL less than 70 -     Rosuvastatin Calcium; TAKE 1/2 TABLET AT BEDTIME  Dispense: 45 tablet; Refill: 3  Stress at home -     QUEtiapine Fumarate; Take 1 tablet (50 mg total) by mouth at bedtime.  Dispense: 90 tablet; Refill: 1    Return in about 3 months (around 09/06/2023).    Mliss Sax, MD

## 2023-06-16 ENCOUNTER — Emergency Department (HOSPITAL_BASED_OUTPATIENT_CLINIC_OR_DEPARTMENT_OTHER)
Admission: EM | Admit: 2023-06-16 | Discharge: 2023-06-16 | Disposition: A | Discharge status: Home / Self Care | Payer: Medicare HMO | Attending: Emergency Medicine | Admitting: Emergency Medicine

## 2023-06-16 ENCOUNTER — Emergency Department (HOSPITAL_BASED_OUTPATIENT_CLINIC_OR_DEPARTMENT_OTHER): Payer: Medicare HMO

## 2023-06-16 ENCOUNTER — Other Ambulatory Visit: Payer: Self-pay

## 2023-06-16 DIAGNOSIS — W19XXXA Unspecified fall, initial encounter: Secondary | ICD-10-CM

## 2023-06-16 DIAGNOSIS — M542 Cervicalgia: Secondary | ICD-10-CM | POA: Insufficient documentation

## 2023-06-16 DIAGNOSIS — R519 Headache, unspecified: Secondary | ICD-10-CM | POA: Diagnosis present

## 2023-06-16 DIAGNOSIS — Y9301 Activity, walking, marching and hiking: Secondary | ICD-10-CM | POA: Diagnosis not present

## 2023-06-16 DIAGNOSIS — J449 Chronic obstructive pulmonary disease, unspecified: Secondary | ICD-10-CM | POA: Diagnosis not present

## 2023-06-16 DIAGNOSIS — Z79899 Other long term (current) drug therapy: Secondary | ICD-10-CM | POA: Diagnosis not present

## 2023-06-16 DIAGNOSIS — I10 Essential (primary) hypertension: Secondary | ICD-10-CM | POA: Insufficient documentation

## 2023-06-16 DIAGNOSIS — S199XXA Unspecified injury of neck, initial encounter: Secondary | ICD-10-CM | POA: Diagnosis not present

## 2023-06-16 DIAGNOSIS — Y92002 Bathroom of unspecified non-institutional (private) residence single-family (private) house as the place of occurrence of the external cause: Secondary | ICD-10-CM | POA: Insufficient documentation

## 2023-06-16 DIAGNOSIS — W0110XA Fall on same level from slipping, tripping and stumbling with subsequent striking against unspecified object, initial encounter: Secondary | ICD-10-CM | POA: Insufficient documentation

## 2023-06-16 DIAGNOSIS — S0990XA Unspecified injury of head, initial encounter: Secondary | ICD-10-CM | POA: Diagnosis not present

## 2023-06-16 DIAGNOSIS — S0101XA Laceration without foreign body of scalp, initial encounter: Secondary | ICD-10-CM | POA: Diagnosis not present

## 2023-06-16 MED ORDER — ACETAMINOPHEN 325 MG PO TABS
650.0000 mg | ORAL_TABLET | Freq: Once | ORAL | Status: AC
  Administered 2023-06-16: 650 mg via ORAL
  Filled 2023-06-16: qty 2

## 2023-06-16 MED ORDER — LIDOCAINE-EPINEPHRINE-TETRACAINE (LET) TOPICAL GEL
3.0000 mL | Freq: Once | TOPICAL | Status: AC
  Administered 2023-06-16: 3 mL via TOPICAL
  Filled 2023-06-16: qty 3

## 2023-06-16 MED ORDER — OXYCODONE-ACETAMINOPHEN 5-325 MG PO TABS
1.0000 | ORAL_TABLET | Freq: Once | ORAL | Status: AC
  Administered 2023-06-16: 1 via ORAL
  Filled 2023-06-16: qty 1

## 2023-06-16 NOTE — ED Provider Notes (Cosign Needed)
[place] EMERGENCY DEPARTMENT AT [facility] Provider Note   CSN: [id number] Arrival date & time: 06/16/23  1517     History  Chief Complaint  Patient presents with   Head Injury    Tiffany Murphy is a 72 y.o. female with past medical history of HTN, HLD, COPD, OSA presents to emergency department for evaluation of head and neck pain following fall.  She reports that she was walking in the bathroom with long PJ pant legs and tripped over one of her pant legs. She fell backwards and her head to hit tile floor.  No LOC, AMS, seizures per husband. She had no complaints prior to fall.  She also endorses dizziness intermittently over the past 2 to 3 years that are becoming more more frequent.  She has more confusion and poor memory over the past month.  She has been having 4 falls a week. Her and her husband have concerns regarding her having alzheimer's due to increased confusion and strong FMHx. This has been brought up to her PCP according to pt.   Head Injury Associated symptoms: neck pain   Associated symptoms: no headaches, no nausea, no numbness, no seizures and no vomiting      Home Medications Prior to Admission medications   Medication Sig Start Date End Date Taking? Authorizing Provider  acyclovir (ZOVIRAX) 200 MG capsule Take 200 mg by mouth daily as needed (for break out). 08/17/21   [provider]  albuterol (PROVENTIL) (2.5 MG/3ML) 0.083% nebulizer solution Take 3 mLs (2.5 mg total) by nebulization every 6 (six) hours as needed for wheezing or shortness of breath. 12/09/22   Mliss Sax, MD  albuterol (VENTOLIN HFA) 108 (90 Base) MCG/ACT inhaler Inhale 2 puffs into the lungs every 6 (six) hours as needed for wheezing. 10/15/21   Georganna Skeans, MD  amLODipine (NORVASC) 5 MG tablet TAKE 1 TABLET EVERY DAY 03/21/23   Mliss Sax, MD  buPROPion (WELLBUTRIN XL) 300 MG 24 hr tablet Take 1 tablet (300 mg total) by mouth daily. 06/08/23    Mliss Sax, MD  Cyanocobalamin (VITAMIN B 12 PO) Take 1 tablet by mouth daily.    [provider]  fluticasone Aleda Grana) 50 MCG/ACT nasal spray Place 2 sprays into each nostril once a day 12/09/22   Mliss Sax, MD  Fluticasone-Umeclidin-Vilant (TRELEGY ELLIPTA) 100-62.5-25 MCG/ACT AEPB INHALE 1 PUFF INTO THE LUNGS ONCE DAILY 04/17/23   Olalere, Onnie Boer A, MD  gabapentin (NEURONTIN) 300 MG capsule TAKE 1 CAPSULE BY MOUTH IN THE MORNING AND 1 IN THE EVENING AND 2 AT BEDTIME 12/30/22   Worthy Rancher B, FNP  hydrocortisone (ANUSOL-HC) 2.5 % rectal cream Insert and apply rectally 2 times a day for 10 days Patient taking differently: Place 1 Application rectally daily as needed for hemorrhoids. 08/31/21     losartan (COZAAR) 100 MG tablet Take 1 tablet by mouth once daily 06/02/22   Mliss Sax, MD  metFORMIN (GLUCOPHAGE-XR) 500 MG 24 hr tablet TAKE 1 TABLET EVERY DAY WITH BREAKFAST 03/28/23   Mliss Sax, MD  montelukast (SINGULAIR) 10 MG tablet Take 1 tablet (10 mg total) by mouth at bedtime. 04/04/22   Mliss Sax, MD  Multiple Vitamin (MULTIVITAMIN WITH MINERALS) TABS tablet Take 1 tablet by mouth daily.    [provider]  Omega-3 Fatty Acids (FISH OIL) 1000 MG CPDR Take 1,000 mg by mouth daily.    [provider]  omeprazole (PRILOSEC) 40 MG capsule TAKE 1  CAPSULE EVERY DAY 04/06/23   Mliss Sax, MD  oxyCODONE-acetaminophen [facility]) 5-325 MG tablet 1-2 tabs po q6 hours prn pain 08/16/22   Betha Loa, MD  polyethylene glycol powder (GLYCOLAX/MIRALAX) 17 GM/SCOOP powder Dissolve 1 scoop of medication in water daily as needed for constipation. 12/22/21   Mliss Sax, MD  QUEtiapine (SEROQUEL) 50 MG tablet Take 1 tablet (50 mg total) by mouth at bedtime. 06/08/23   Mliss Sax, MD  rosuvastatin (CRESTOR) 40 MG tablet TAKE 1/2 TABLET AT BEDTIME 06/08/23   Mliss Sax, MD   sertraline (ZOLOFT) 100 MG tablet Take 2 tablets (200 mg total) by mouth daily. 06/08/23   Mliss Sax, MD      Allergies    Penicillins, Lisinopril, Lovastatin, and Other    Review of Systems   Review of Systems  Constitutional:  Negative for chills, fatigue and fever.  Respiratory:  Negative for cough, chest tightness, shortness of breath and wheezing.   Cardiovascular:  Negative for chest pain and palpitations.  Gastrointestinal:  Negative for abdominal pain, constipation, diarrhea, nausea and vomiting.  Musculoskeletal:  Positive for neck pain.  Neurological:  Negative for dizziness, seizures, weakness, light-headedness, numbness and headaches.    Physical Exam Updated Vital Signs BP 134/73   Pulse 67   Temp 98.3 F (36.8 C)   Resp 18   Ht 5\' 2"  (1.575 m)   Wt 77.1 kg   SpO2 96%   BMI 31.09 kg/m  Physical Exam Vitals and nursing note reviewed.  Constitutional:      General: She is not in acute distress.    Appearance: Normal appearance. She is not diaphoretic.  HENT:     Head: Normocephalic. No raccoon eyes or Battle's sign.     Jaw: No trismus, tenderness, swelling or pain on movement.     Comments: 2 cm laceration to posterior scalp.  No active hemorrhage No hematoma nor TTP of cranium No crepitus to facial bones    Right Ear: External ear normal. No hemotympanum.     Left Ear: External ear normal. No hemotympanum.     Nose: Nose normal.     Right Nostril: No epistaxis or septal hematoma.     Left Nostril: No epistaxis or septal hematoma.     Mouth/Throat:     Mouth: Mucous membranes are moist. No injury or lacerations.  Eyes:     General: No scleral icterus.       Right eye: No discharge.        Left eye: No discharge.     Extraocular Movements: Extraocular movements intact.     Conjunctiva/sclera: Conjunctivae normal.     Pupils: Pupils are equal, round, and reactive to light.     Comments: No subconjunctival hemorrhage, hyphema, tear drop  pupil, or fluid leakage bilaterally  Neck:     Vascular: No carotid bruit.  Cardiovascular:     Rate and Rhythm: Normal rate.     Pulses: Normal pulses.          Radial pulses are 2+ on the right side and 2+ on the left side.       Dorsalis pedis pulses are 2+ on the right side and 2+ on the left side.  Pulmonary:     Effort: Pulmonary effort is normal. No respiratory distress.     Breath sounds: Normal breath sounds. No wheezing.  Chest:     Chest wall: No tenderness.  Abdominal:     General: Bowel sounds  are normal. There is no distension.     Palpations: Abdomen is soft.     Tenderness: There is no abdominal tenderness. There is no guarding or rebound.  Musculoskeletal:     Cervical back: Full passive range of motion without pain, normal range of motion and neck supple. No deformity, rigidity, tenderness or bony tenderness. Normal range of motion.     Thoracic back: No deformity or bony tenderness. Normal range of motion.     Lumbar back: No deformity or bony tenderness. Normal range of motion.     Right hip: No bony tenderness or crepitus.     Left hip: No bony tenderness or crepitus.     Right lower leg: No edema.     Left lower leg: No edema.     Comments: No obvious deformity to joints or long bones Pelvis stable with no shortening or rotation of LE bilaterally  Skin:    General: Skin is warm and dry.     Capillary Refill: Capillary refill takes less than 2 seconds.     Coloration: Skin is not jaundiced or pale.  Neurological:     General: No focal deficit present.     Mental Status: She is alert and oriented to person, place, and time. Mental status is at baseline.     GCS: GCS eye subscore is 4. GCS verbal subscore is 5. GCS motor subscore is 6.     Cranial Nerves: Cranial nerves 2-12 are intact. No cranial nerve deficit.     Sensory: Sensation is intact. No sensory deficit.     Motor: Motor function is intact. No weakness, tremor or seizure activity.     Coordination:  Coordination is intact. Coordination normal. Finger-Nose-Finger Test and Heel to [facility] Test normal.     Gait: Gait is intact. Gait normal.     Deep Tendon Reflexes: Reflexes are normal and symmetric. Reflexes normal.     Reflex Scores:      Bicep reflexes are 2+ on the right side and 2+ on the left side.      Patellar reflexes are 2+ on the right side and 2+ on the left side.    Comments: Acting following commands appropriately and ambulate without difficulty    ED Results / Procedures / Treatments   Labs (all labs ordered are listed, but only abnormal results are displayed) Labs Reviewed - No data to display  EKG None  Radiology CT Head Wo Contrast Result Date: 06/16/2023 CLINICAL DATA:  Head trauma, moderate-severe; Neck trauma (Age >= 65y) EXAM: CT HEAD WITHOUT CONTRAST CT CERVICAL SPINE WITHOUT CONTRAST TECHNIQUE: Multidetector CT imaging of the head and cervical spine was performed following the standard protocol without intravenous contrast. Multiplanar CT image reconstructions of the cervical spine were also generated. RADIATION DOSE REDUCTION: This exam was performed according to the departmental dose-optimization program which includes automated exposure control, adjustment of the mA and/or kV according to patient size and/or use of iterative reconstruction technique. COMPARISON:  CT head September 04, 2022. FINDINGS: CT HEAD FINDINGS Brain: No evidence of acute infarction, hemorrhage, hydrocephalus, extra-axial collection or mass lesion/mass effect. Vascular: Calcific atherosclerosis. Skull: No acute fracture. Sinuses/Orbits: Clear sinuses.  No acute orbital findings. Other: No mastoid effusions. CT CERVICAL SPINE FINDINGS Alignment: Straightening.  No substantial sagittal subluxation. Skull base and vertebrae: Vertebral body heights are maintained. No evidence of acute fracture. Soft tissues and spinal canal: No prevertebral fluid or swelling. No visible canal hematoma. Disc levels:  Moderate to severe multilevel  degenerative change including degenerative disc disease with disc height loss and endplate spurring. Facet/uncovertebral hypertrophy resulting varying degrees of neural foraminal stenosis. Upper chest: Visualized lung apices are clear. Other: Calcific atherosclerosis. IMPRESSION: No evidence of acute abnormality intracranially or in the cervical spine. Electronically Signed   By: Feliberto Harts M.D.   On: 06/16/2023 16:26   CT Cervical Spine Wo Contrast Result Date: 06/16/2023 CLINICAL DATA:  Head trauma, moderate-severe; Neck trauma (Age >= 65y) EXAM: CT HEAD WITHOUT CONTRAST CT CERVICAL SPINE WITHOUT CONTRAST TECHNIQUE: Multidetector CT imaging of the head and cervical spine was performed following the standard protocol without intravenous contrast. Multiplanar CT image reconstructions of the cervical spine were also generated. RADIATION DOSE REDUCTION: This exam was performed according to the departmental dose-optimization program which includes automated exposure control, adjustment of the mA and/or kV according to patient size and/or use of iterative reconstruction technique. COMPARISON:  CT head September 04, 2022. FINDINGS: CT HEAD FINDINGS Brain: No evidence of acute infarction, hemorrhage, hydrocephalus, extra-axial collection or mass lesion/mass effect. Vascular: Calcific atherosclerosis. Skull: No acute fracture. Sinuses/Orbits: Clear sinuses.  No acute orbital findings. Other: No mastoid effusions. CT CERVICAL SPINE FINDINGS Alignment: Straightening.  No substantial sagittal subluxation. Skull base and vertebrae: Vertebral body heights are maintained. No evidence of acute fracture. Soft tissues and spinal canal: No prevertebral fluid or swelling. No visible canal hematoma. Disc levels: Moderate to severe multilevel degenerative change including degenerative disc disease with disc height loss and endplate spurring. Facet/uncovertebral hypertrophy resulting varying degrees  of neural foraminal stenosis. Upper chest: Visualized lung apices are clear. Other: Calcific atherosclerosis. IMPRESSION: No evidence of acute abnormality intracranially or in the cervical spine. Electronically Signed   By: Feliberto Harts M.D.   On: 06/16/2023 16:26    Procedures .Laceration Repair  Date/Time: 06/16/2023 7:47 PM  Performed by: Judithann Sheen, PA Authorized by: Judithann Sheen, PA   Consent:    Consent obtained:  Verbal   Consent given by:  Patient   Risks, benefits, and alternatives were discussed: yes     Risks discussed:  Infection, pain and poor wound healing   Alternatives discussed:  No treatment Universal protocol:    Procedure explained and questions answered to patient or proxy's satisfaction: yes     Patient identity confirmed:  Verbally with patient and arm band Anesthesia:    Anesthesia method:  Topical application   Topical anesthetic:  LET Laceration details:    Location:  Scalp   Scalp location:  Occipital   Length (cm):  2.7 Pre-procedure details:    Preparation:  Patient was prepped and draped in usual sterile fashion Treatment:    Area cleansed with:  Saline   Amount of cleaning:  Standard   Irrigation solution:  Sterile saline   Irrigation volume:  250   Irrigation method:  Pressure wash Skin repair:    Repair method:  Staples   Number of staples:  2 Approximation:    Approximation:  Close Repair type:    Repair type:  Simple Post-procedure details:    Dressing:  Open (no dressing)   Procedure completion:  Tolerated well, no immediate complications     Medications Ordered in ED Medications  acetaminophen (TYLENOL) tablet 650 mg (650 mg Oral Given 06/16/23 1548)  oxyCODONE-acetaminophen (PERCOCET/ROXICET) 5-325 MG per tablet 1 tablet (1 tablet Oral Given 06/16/23 1908)  lidocaine-EPINEPHrine-tetracaine (LET) topical gel (3 mLs Topical Given 06/16/23 1909)    ED Course/ Medical Decision Making/ A&P  Medical Decision Making Amount and/or Complexity of Data Reviewed Radiology: ordered.  Risk OTC drugs. Prescription drug management.   Patient presents to the ED for concern of head lac following fall, this involves an extensive number of treatment options, and is a complaint that carries with it a high risk of complications and morbidity.     Co morbidities that complicate the patient evaluation  HTN, HLD, COPD, OSA   Additional history obtained:  Additional history obtained from [facility], Nursing, and Outside Medical Records   External records from outside source obtained and reviewed including  Husband at bedside Triage RN note Visit from 09/04/2022   Lab Tests:  Due to repeated falls and advanced age, I offered to obtain lab work however patient and patient's husband refused  Imaging Studies ordered:  I ordered imaging studies including CT head and neck  I independently visualized and interpreted imaging which showed no acute findings I agree with the radiologist interpretation   Cardiac Monitoring:  Due to repeated falls and advanced age, I offered to obtain lab work however patient and patient's husband refused   Medicines ordered and prescription drug management:  I ordered medication including LET and perc  for pain  Reevaluation of the patient after these medicines showed that the patient improved I have reviewed the patients home medicines and have made adjustments as needed    Problem List / ED Course:  Fall, initial encounter Head injury Imaging negative for ICH, acute intracranial pathology Due to repeated falls I offered to obtain lab work however they refused Lack repaired with staples (see procedure note) Tdap 05/2022 Discussed return to emergency department precautions with patient and patient's family who expressed understanding agree with plan.  All questions answered to her satisfaction.  They are agreeable to discharge at this  time.   Reevaluation:  After the interventions noted above, I reevaluated the patient and found that they have :improved   Social Determinants of Health:  Has PCP follow-up   Dispostion:  After consideration of the diagnostic results and the patients response to treatment, I feel that the patent would benefit from patient management and follow-up of chronic complaints with PCP.   Final Clinical Impression(s) / ED Diagnoses Final diagnoses:  Fall, initial encounter  Laceration of scalp without foreign body, initial encounter    Rx / DC Orders ED Discharge Orders     None         Judithann Sheen, PA 06/16/23 2008

## 2023-06-16 NOTE — Discharge Instructions (Signed)
Thank you for letting us evaluate you today.  We have closed your laceration to your head with sutures.  You may wash your hair normally.  However, do not submerge your head in dirty water, pool water.  Return to emergency department if you experience altered mental status, seizures, repeated episodes of vomiting.

## 2023-06-16 NOTE — ED Notes (Signed)
Patient refusing labs to be drawn.

## 2023-06-16 NOTE — ED Notes (Signed)
Pt refused blood work  

## 2023-06-16 NOTE — ED Notes (Signed)
Noted right side head scalp laceration about 1 inch long cleansed.  Bleeding controlled

## 2023-06-16 NOTE — ED Triage Notes (Addendum)
Fell 1hr PTA. Tripped while stepping up from chair. Unsure of LOC upon striking head but denies syncope/dizziness upon standing. Struck right side head. ~3cm laceration. Cleaned at home with tap water. Wound cleaner applied in triage. Moist gauze applied and secured with ACE wrap. Bleeding controlled on arrival. No thinners. Neck pain. C-collar applied.

## 2023-06-23 ENCOUNTER — Telehealth: Payer: Self-pay

## 2023-06-23 NOTE — Telephone Encounter (Addendum)
 PAPERWORK/FORMS received 06/20/23 Dropped off by: faxed from [facility] Call back #: [phone] Individual made aware of 3-5 business day turn around (YES/NO): - GREEN charge sheet completed and patient made aware of possible charge (YES/NO): No Placed in provider folder at front desk. ~~~ route to CMA/provider Team  CLINICAL USE BELOW THIS LINE (use X to signify action taken)  _X__ Form received and placed in providers office for signature. ___ Form completed and faxed to LOA Dept.  ___ Form completed & LVM to notify patient ready for pick up.  ___ Charge sheet and copy of form in front office folder for office supervisor.

## 2023-06-28 ENCOUNTER — Other Ambulatory Visit: Payer: Self-pay | Admitting: Registered Nurse

## 2023-06-28 DIAGNOSIS — G3184 Mild cognitive impairment, so stated: Secondary | ICD-10-CM

## 2023-07-05 ENCOUNTER — Ambulatory Visit: Payer: Medicare Other | Admitting: Pulmonary Disease

## 2023-07-05 ENCOUNTER — Encounter: Payer: Self-pay | Admitting: Pulmonary Disease

## 2023-07-05 VITALS — BP 108/60 | HR 81 | Temp 98.1°F | Ht 62.0 in | Wt 178.0 lb

## 2023-07-05 DIAGNOSIS — J42 Unspecified chronic bronchitis: Secondary | ICD-10-CM | POA: Diagnosis not present

## 2023-07-05 MED ORDER — TRELEGY ELLIPTA 100-62.5-25 MCG/ACT IN AEPB: 1.0000 | INHALATION_SPRAY | Freq: Every day | RESPIRATORY_TRACT | 6 refills | Status: AC

## 2023-07-05 NOTE — Patient Instructions (Signed)
 I will see you back in 3 months  Schedule for pulmonary function test  Referral to pulmonary rehab  Call us  with significant concerns  Continue your Trelegy

## 2023-07-05 NOTE — Progress Notes (Signed)
 Tiffany Murphy    [id number]    1950/08/08  Primary Care Physician:No primary care provider on file.  Referring Physician: Tonna Frederic, MD [address] [place],  [place] [address]  Chief complaint:   Patient being seen for follow-up for COPD  HPI:  Has been doing relatively well  Has had some falls recently from just being weak Does have some arthritis that limits ambulation  Breathing has been relatively stable Continues Trelegy  Has not smoked for over a year  She feels relatively well otherwise  Occasional cough, not bringing up any secretions  Diagnosed with mild sleep apnea in the past Did use CPAP for a while but has not used CPAP for few years now and does not want to go back to using CPAP Not concerned about sleep apnea at present  Smoked about half a pack of cigarettes a day in the past, has not smoked for years now  She has a history of hypertension, mood disorder, panic attacks Hyperlipidemia Lung nodule noted in the past  History of chronic fatigue  Outpatient Encounter Medications as of 07/05/2023  Medication Sig   acyclovir  (ZOVIRAX ) 200 MG capsule Take 200 mg by mouth daily as needed (for break out).   albuterol  (PROVENTIL ) (2.5 MG/3ML) 0.083% nebulizer solution Take 3 mLs (2.5 mg total) by nebulization every 6 (six) hours as needed for wheezing or shortness of breath.   albuterol  (VENTOLIN  HFA) 108 (90 Base) MCG/ACT inhaler Inhale 2 puffs into the lungs every 6 (six) hours as needed for wheezing.   amLODipine  (NORVASC ) 5 MG tablet TAKE 1 TABLET EVERY DAY   buPROPion  (WELLBUTRIN  XL) 300 MG 24 hr tablet Take 1 tablet (300 mg total) by mouth daily.   Cyanocobalamin (VITAMIN B 12 PO) Take 1 tablet by mouth daily.   fluticasone  (FLONASE ) 50 MCG/ACT nasal spray Place 2 sprays into each nostril once a day   Fluticasone -Umeclidin-Vilant (TRELEGY ELLIPTA ) 100-62.5-25 MCG/ACT AEPB INHALE 1 PUFF INTO THE LUNGS ONCE DAILY   gabapentin   (NEURONTIN ) 300 MG capsule TAKE 1 CAPSULE BY MOUTH IN THE MORNING AND 1 IN THE EVENING AND 2 AT BEDTIME   hydrocortisone  (ANUSOL -HC) 2.5 % rectal cream Insert and apply rectally 2 times a day for 10 days (Patient taking differently: Place 1 Application rectally daily as needed for hemorrhoids.)   losartan  (COZAAR ) 100 MG tablet Take 1 tablet by mouth once daily   metFORMIN  (GLUCOPHAGE -XR) 500 MG 24 hr tablet TAKE 1 TABLET EVERY DAY WITH BREAKFAST   montelukast  (SINGULAIR ) 10 MG tablet Take 1 tablet (10 mg total) by mouth at bedtime.   Multiple Vitamin (MULTIVITAMIN WITH MINERALS) TABS tablet Take 1 tablet by mouth daily.   Omega-3 Fatty Acids (FISH OIL) 1000 MG CPDR Take 1,000 mg by mouth daily.   omeprazole  (PRILOSEC) 40 MG capsule TAKE 1 CAPSULE EVERY DAY   polyethylene glycol powder (GLYCOLAX /MIRALAX ) 17 GM/SCOOP powder Dissolve 1 scoop of medication in water daily as needed for constipation.   QUEtiapine  (SEROQUEL ) 50 MG tablet Take 1 tablet (50 mg total) by mouth at bedtime.   rosuvastatin  (CRESTOR ) 40 MG tablet TAKE 1/2 TABLET AT BEDTIME   sertraline  (ZOLOFT ) 100 MG tablet Take 2 tablets (200 mg total) by mouth daily.   [DISCONTINUED] oxyCODONE -acetaminophen  (PERCOCET) 5-325 MG tablet 1-2 tabs po q6 hours prn pain (Patient not taking: Reported on 07/05/2023)   No facility-administered encounter medications on file as of 07/05/2023.    Allergies as of 07/05/2023 -  Review Complete 07/05/2023  Allergen Reaction Noted   Penicillins Hives and Shortness Of Breath 08/05/2012   Lisinopril  Other (See Comments) 12/05/2019   Lovastatin Other (See Comments) 07/11/2013   Other Other (See Comments) 07/11/2013    Past Medical History:  Diagnosis Date   Allergy    "THINK I GOT THEM"   Anxiety    Arthritis    "BODY FULL OF ARTHRITIS"   Asthma    Bronchitis    Cancer (HCC)    "RIGHT LEG SQUAMOUS CELL"   Closed fracture of left distal radius 10/25/2018   COPD (chronic obstructive pulmonary  disease) (HCC)    Depression    GERD (gastroesophageal reflux disease)    Headache    Hypercholesteremia    Hypertension    IBS (irritable bowel syndrome)    Pre-diabetes    Sleep apnea    Tobacco abuse    Vitamin D  deficiency     Past Surgical History:  Procedure Laterality Date   APPENDECTOMY     COLONOSCOPY  08/26/2015   Colonic polyps status post polypectomy. Minimal sigmoid diverticulosis.    ESOPHAGOGASTRODUODENOSCOPY  02/25/2011   Large hiatal hernia otherwise normal EGD.   HARDWARE REMOVAL Right 08/16/2022   Procedure: PLATE REMOVAL;  Surgeon: Brunilda Capra, MD;  Location: [facility];  Service: Orthopedics;  Laterality: Right;   I & D EXTREMITY Right 06/17/2022   Procedure: IRRIGATION AND DEBRIDEMENT RIGHT UPPER EXTREMITY;  Surgeon: Brunilda Capra, MD;  Location: [facility];  Service: Orthopedics;  Laterality: Right;   OPEN REDUCTION INTERNAL FIXATION (ORIF) DISTAL RADIAL FRACTURE Left 10/25/2018   Procedure: OPEN REDUCTION INTERNAL FIXATION (ORIF)LEFT  DISTAL RADIAL FRACTURE;  Surgeon: Osa Blase, MD;  Location: [facility];  Service: Orthopedics;  Laterality: Left;   OPEN REDUCTION INTERNAL FIXATION (ORIF) DISTAL RADIAL FRACTURE Right 07/28/2022   Procedure: OPEN REDUCTION INTERNAL FIXATION (ORIF) RIGHT DISTAL RADIUS FRACTURE CLOSED TREATMENT OF DISTAL ULNA FRACTURE;  Surgeon: Brunilda Capra, MD;  Location: [facility];  Service: Orthopedics;  Laterality: Right;  120 MIN   OPEN REDUCTION INTERNAL FIXATION (ORIF) DISTAL RADIAL FRACTURE Right 08/16/2022   Procedure: OPEN REDUCTION INTERNAL FIXATION (ORIF) RIGHT RADIUS FRACTURE;  Surgeon: Brunilda Capra, MD;  Location: [facility];  Service: Orthopedics;  Laterality: Right;  90 MIN   POLYPECTOMY     VAGINAL HYSTERECTOMY      Family History  Problem Relation Age of Onset   CAD Mother 16   Alzheimer's disease Father 61   Diabetes Father    Colon cancer Neg Hx    Esophageal  cancer Neg Hx    Colon polyps Neg Hx    Rectal cancer Neg Hx    Stomach cancer Neg Hx    Breast cancer Neg Hx     Social History   Socioeconomic History   Marital status: Married    Spouse name: Not on file   Number of children: 2   Years of education: Not on file   Highest education level: Not on file  Occupational History   Not on file  Tobacco Use   Smoking status: Former    Current packs/day: 0.00    Average packs/day: 0.5 packs/day for 44.0 years (22.0 ttl pk-yrs)    Types: Cigarettes    Start date: 09/07/1978    Quit date: 09/07/2022    Years since quitting: 0.8    Passive exposure: Current   Smokeless tobacco: Never   Tobacco comments:    in process  of quitting  Vaping Use   Vaping status: Former   Devices: did it for 6 months and stopped   Substance and Sexual Activity   Alcohol use: Yes   Drug use: Yes    Types: Marijuana    Comment: 2 puffs per day-WHEN SICK, last time a month ago   Sexual activity: Not Currently  Other Topics Concern   Not on file  Social History Narrative   Works at [employer].    Social Drivers of Corporate investment banker Strain: Low Risk  (05/10/2022)   Overall Financial Resource Strain (CARDIA)    Difficulty of Paying Living Expenses: Not hard at all  Food Insecurity: No Food Insecurity (05/10/2022)   Hunger Vital Sign    Worried About Running Out of Food in the Last Year: Never true    Ran Out of Food in the Last Year: Never true  Transportation Needs: No Transportation Needs (05/10/2022)   PRAPARE - Administrator, Civil Service (Medical): No    Lack of Transportation (Non-Medical): No  Physical Activity: Inactive (05/10/2022)   Exercise Vital Sign    Days of Exercise per Week: 0 days    Minutes of Exercise per Session: 0 min  Stress: Stress Concern Present (05/10/2022)   [employer] of Occupational Health - Occupational Stress Questionnaire    Feeling of Stress : To some extent  Social  Connections: Not on file  Intimate Partner Violence: Not on file    Review of Systems  Constitutional:  Positive for fatigue.  HENT: Negative.    Eyes: Negative.   Respiratory:  Positive for apnea, cough and shortness of breath.   Cardiovascular: Negative.   Gastrointestinal: Negative.   Endocrine: Negative.   Genitourinary: Negative.   Neurological:  Positive for weakness.  Psychiatric/Behavioral:  Positive for sleep disturbance.   All other systems reviewed and are negative.   Vitals:   07/05/23 0850  BP: 108/60  Pulse: 81  Temp: 98.1 F (36.7 C)  SpO2: 97%   Physical Exam Constitutional:      Appearance: She is well-developed. She is obese.  HENT:     Head: Normocephalic and atraumatic.     Mouth/Throat:     Mouth: Mucous membranes are moist.  Eyes:     General:        Right eye: No discharge.        Left eye: No discharge.     Conjunctiva/sclera: Conjunctivae normal.     Pupils: Pupils are equal, round, and reactive to light.  Neck:     Thyroid : No thyromegaly.     Trachea: No tracheal deviation.  Cardiovascular:     Rate and Rhythm: Normal rate and regular rhythm.  Pulmonary:     Effort: Pulmonary effort is normal. No respiratory distress.     Breath sounds: No stridor. No wheezing or rhonchi.  Musculoskeletal:     Cervical back: No rigidity or tenderness.  Neurological:     Mental Status: She is alert.  Psychiatric:        Mood and Affect: Mood normal.    Data Reviewed: Report of a previous CT scan noted in record-nodules Most recent CT reviewed showing stable nodules-reviewed by myself  Sleep study results not available  Assessment:  .  Obstructive lung disease -No longer smoking -Has been off cigarettes for about a year now  She is on Trelegy -Claims compliance with Trelegy  .  History of abnormal CT scan of the  chest .  Old granulomatous disease  .  History of obstructive sleep apnea -Not on CPAP at present -Did have mild sleep  apnea  .  Multifactorial shortness of breath likely related to COPD and also deconditioning  .  Active smoker -Quit about a year ago  Plan/Recommendations:  Continue Trelegy  Graded exercise as tolerated  Schedule for pulmonary function test  Referral to pulmonary rehab  Encouraged to call with significant concerns  I spent 30 minutes dedicated to the care of this patient on the date of this encounter to include previsit review of records, face-to-face time with the patient discussing conditions above, post visit ordering of testing,ordering medications,independentlyinterpreting results, clinical documentation with electronic health record and communicated necessary findings to members of the patient's care team  Myer Artis MD [facility] 07/05/2023, 8:55 AM  CC: Tonna Frederic,*

## 2023-07-17 ENCOUNTER — Other Ambulatory Visit: Payer: Medicare HMO

## 2023-07-23 ENCOUNTER — Inpatient Hospital Stay: Admission: RE | Admit: 2023-07-23 | Payer: Medicare Other | Source: Ambulatory Visit

## 2023-08-01 ENCOUNTER — Telehealth: Payer: Self-pay

## 2023-08-01 NOTE — Telephone Encounter (Unsigned)
Copied from CRM [id number]. Topic: Clinical - Medical Advice >> Aug 01, 2023  4:39 PM Alcus Dad wrote: Reason for CRM: Patient stated she wanted to leave a Message for Dr. Doreene Burke she will not be back and that she will miss him

## 2023-08-14 ENCOUNTER — Other Ambulatory Visit: Payer: Self-pay | Admitting: Family Medicine

## 2023-08-14 DIAGNOSIS — F418 Other specified anxiety disorders: Secondary | ICD-10-CM

## 2023-08-16 ENCOUNTER — Other Ambulatory Visit: Payer: Self-pay

## 2023-08-16 ENCOUNTER — Emergency Department (HOSPITAL_BASED_OUTPATIENT_CLINIC_OR_DEPARTMENT_OTHER)
Admission: EM | Admit: 2023-08-16 | Discharge: 2023-08-16 | Disposition: A | Discharge status: Home / Self Care | Payer: Medicare Other | Attending: Emergency Medicine | Admitting: Emergency Medicine

## 2023-08-16 ENCOUNTER — Encounter (HOSPITAL_BASED_OUTPATIENT_CLINIC_OR_DEPARTMENT_OTHER): Payer: Self-pay | Admitting: Emergency Medicine

## 2023-08-16 ENCOUNTER — Emergency Department (HOSPITAL_BASED_OUTPATIENT_CLINIC_OR_DEPARTMENT_OTHER): Payer: Medicare Other

## 2023-08-16 DIAGNOSIS — Z79899 Other long term (current) drug therapy: Secondary | ICD-10-CM | POA: Insufficient documentation

## 2023-08-16 DIAGNOSIS — J449 Chronic obstructive pulmonary disease, unspecified: Secondary | ICD-10-CM | POA: Insufficient documentation

## 2023-08-16 DIAGNOSIS — I10 Essential (primary) hypertension: Secondary | ICD-10-CM | POA: Diagnosis not present

## 2023-08-16 DIAGNOSIS — S92355A Nondisplaced fracture of fifth metatarsal bone, left foot, initial encounter for closed fracture: Secondary | ICD-10-CM | POA: Diagnosis not present

## 2023-08-16 DIAGNOSIS — J45909 Unspecified asthma, uncomplicated: Secondary | ICD-10-CM | POA: Diagnosis not present

## 2023-08-16 DIAGNOSIS — W19XXXA Unspecified fall, initial encounter: Secondary | ICD-10-CM | POA: Insufficient documentation

## 2023-08-16 DIAGNOSIS — Z7951 Long term (current) use of inhaled steroids: Secondary | ICD-10-CM | POA: Diagnosis not present

## 2023-08-16 DIAGNOSIS — S99922A Unspecified injury of left foot, initial encounter: Secondary | ICD-10-CM | POA: Diagnosis present

## 2023-08-16 MED ORDER — OXYCODONE HCL 5 MG PO TABS
5.0000 mg | ORAL_TABLET | Freq: Once | ORAL | Status: AC
  Administered 2023-08-16: 5 mg via ORAL
  Filled 2023-08-16: qty 1

## 2023-08-16 MED ORDER — OXYCODONE HCL 5 MG PO TABS: 5.0000 mg | ORAL_TABLET | ORAL | 0 refills | Status: DC | PRN

## 2023-08-16 NOTE — ED Triage Notes (Signed)
 Left foot pain after fall on Sunday, , she ace wrapped but not better , hurts to bear wt

## 2023-08-16 NOTE — ED Provider Notes (Signed)
 [place] EMERGENCY DEPARTMENT AT [facility] Provider Note   CSN: [id number] Arrival date & time: 08/16/23  1632     History  Chief Complaint  Patient presents with   Fall   Foot Injury    Tiffany Murphy is a 73 y.o. female with PMHx anxiety, OA, asthma, COPD, depression, GERD, headaches, HLD, HTN, IBS who presents to the ED concern for left foot pain after fall 2 days ago. Patient stating that she slipped and may have hit her foot on something. Denies head trauma/LOC. Patient stating that she has broken many bones in BL feet over her lifetime.    Fall  Foot Injury      Home Medications Prior to Admission medications   Medication Sig Start Date End Date Taking? Authorizing Provider  acyclovir (ZOVIRAX) 200 MG capsule Take 200 mg by mouth daily as needed (for break out). 08/17/21   [provider]  albuterol (PROVENTIL) (2.5 MG/3ML) 0.083% nebulizer solution Take 3 mLs (2.5 mg total) by nebulization every 6 (six) hours as needed for wheezing or shortness of breath. 12/09/22   Mliss Sax, MD  albuterol (VENTOLIN HFA) 108 (90 Base) MCG/ACT inhaler Inhale 2 puffs into the lungs every 6 (six) hours as needed for wheezing. 10/15/21   Georganna Skeans, MD  amLODipine (NORVASC) 5 MG tablet TAKE 1 TABLET EVERY DAY 03/21/23   Mliss Sax, MD  buPROPion (WELLBUTRIN XL) 300 MG 24 hr tablet Take 1 tablet (300 mg total) by mouth daily. 06/08/23   Mliss Sax, MD  Cyanocobalamin (VITAMIN B 12 PO) Take 1 tablet by mouth daily.    [provider]  fluticasone Aleda Grana) 50 MCG/ACT nasal spray Place 2 sprays into each nostril once a day 12/09/22   Mliss Sax, MD  Fluticasone-Umeclidin-Vilant (TRELEGY ELLIPTA) 100-62.5-25 MCG/ACT AEPB Inhale 1 puff into the lungs daily. 07/05/23   Olalere, Onnie Boer A, MD  gabapentin (NEURONTIN) 300 MG capsule TAKE 1 CAPSULE BY MOUTH IN THE MORNING AND 1 IN THE EVENING AND 2 AT BEDTIME 12/30/22   Worthy Rancher B, FNP  hydrocortisone (ANUSOL-HC) 2.5 % rectal cream Insert and apply rectally 2 times a day for 10 days Patient taking differently: Place 1 Application rectally daily as needed for hemorrhoids. 08/31/21     losartan (COZAAR) 100 MG tablet Take 1 tablet by mouth once daily 06/02/22   Mliss Sax, MD  metFORMIN (GLUCOPHAGE-XR) 500 MG 24 hr tablet TAKE 1 TABLET EVERY DAY WITH BREAKFAST 03/28/23   Mliss Sax, MD  montelukast (SINGULAIR) 10 MG tablet Take 1 tablet (10 mg total) by mouth at bedtime. 04/04/22   Mliss Sax, MD  Multiple Vitamin (MULTIVITAMIN WITH MINERALS) TABS tablet Take 1 tablet by mouth daily.    [provider]  Omega-3 Fatty Acids (FISH OIL) 1000 MG CPDR Take 1,000 mg by mouth daily.    [provider]  omeprazole (PRILOSEC) 40 MG capsule TAKE 1 CAPSULE EVERY DAY 04/06/23   Mliss Sax, MD  polyethylene glycol powder [facility]) 17 GM/SCOOP powder Dissolve 1 scoop of medication in water daily as needed for constipation. 12/22/21   Mliss Sax, MD  QUEtiapine (SEROQUEL) 50 MG tablet Take 1 tablet (50 mg total) by mouth at bedtime. 06/08/23   Mliss Sax, MD  rosuvastatin (CRESTOR) 40 MG tablet TAKE 1/2 TABLET AT BEDTIME 06/08/23   Mliss Sax, MD  sertraline (ZOLOFT) 100 MG tablet Take 2 tablets by mouth once daily 08/14/23  Mliss Sax, MD      Allergies    Penicillins, Lisinopril, Lovastatin, and Other    Review of Systems   Review of Systems  Musculoskeletal:        Foot pain    Physical Exam Updated Vital Signs BP 130/79 (BP Location: Left Arm)   Pulse 78   Temp 98 F (36.7 C) (Oral)   Resp 18   Ht 5\' 2"  (1.575 m)   Wt 76.2 kg   SpO2 95%   BMI 30.73 kg/m  Physical Exam Vitals and nursing note reviewed.  Constitutional:      General: She is not in acute distress.    Appearance: She is not ill-appearing or toxic-appearing.  HENT:     Head:  Normocephalic and atraumatic.  Eyes:     General: No scleral icterus.       Right eye: No discharge.        Left eye: No discharge.     Conjunctiva/sclera: Conjunctivae normal.  Cardiovascular:     Rate and Rhythm: Normal rate.  Pulmonary:     Effort: Pulmonary effort is normal.  Abdominal:     General: Abdomen is flat.  Musculoskeletal:     Comments: Tenderness to palpation of left 5th metatarsal base. No swelling or erythema. +2 pedal pulses. Brisk capillary refill.   Skin:    General: Skin is warm and dry.  Neurological:     General: No focal deficit present.     Mental Status: She is alert. Mental status is at baseline.  Psychiatric:        Mood and Affect: Mood normal.        Behavior: Behavior normal.     ED Results / Procedures / Treatments   Labs (all labs ordered are listed, but only abnormal results are displayed) Labs Reviewed - No data to display  EKG None  Radiology DG Foot Complete Left Result Date: 08/16/2023 CLINICAL DATA:  Fall and left foot pain. EXAM: LEFT FOOT - COMPLETE 3+ VIEW COMPARISON:  Left foot radiograph dated 11/17/2020. FINDINGS: Nondisplaced transverse fracture of the proximal fifth metatarsal. No other acute fracture. The bones are osteopenic. There is no dislocation. The soft tissues are unremarkable. IMPRESSION: Nondisplaced fracture of the proximal fifth metatarsal. Electronically Signed   By: Elgie Collard M.D.   On: 08/16/2023 18:15    Procedures Procedures    Medications Ordered in ED Medications  oxyCODONE (Oxy IR/ROXICODONE) immediate release tablet 5 mg (5 mg Oral Given 08/16/23 1924)    ED Course/ Medical Decision Making/ A&P                                 Medical Decision Making Amount and/or Complexity of Data Reviewed Radiology: ordered.  Risk Prescription drug management.   This patient presents to the ED for concern of left foot pain, this involves an extensive number of treatment options, and is a complaint  that carries with it a high risk of complications and morbidity.  The differential diagnosis includes hemarthrosis, gout, septic joint, fracture, tendonitis, carpal tunnel syndrome, muscle strain, bursitis, compartment syndrome   Co morbidities that complicate the patient evaluation  anxiety, OA, asthma, COPD, depression, GERD, headaches, HLD, HTN, IBS    Additional history obtained:  No PCP in patient's chart.  Will refer to community clinic.    Problem List / ED Course / Critical interventions / Medication management  Patient presents  to ED concern for left foot pain after slip and fall 2 days ago.  Denies any other infectious symptoms.  Physical exam with tenderness to palpation of left fifth metatarsal head.  Rest of physical exam reassuring.  Patient afebrile with stable vitals. I ordered imaging studies including left foot x-ray. I independently visualized and interpreted imaging. I agree with the radiologist interpretation of fracture to the fifth metatarsal base. Shared results with patient. Shared results with patient.  Answered patient's questions.  Provided patient with pain medication.  Posterior splint applied.  Patient declining crutches stating that she would rather use her walker.  Educated patient on the need to follow-up with orthopedic provider and PCP.  Patient verbalized understanding of plan. I have reviewed the patients home medicines and have made adjustments as needed Staffed with Dr. Wallace Cullens Patient afebrile with stable vitals.  Provided with return precautions.  Discharged in good condition.   Ddx these are considered less likely due to history of present illness and physical exam -hemarthrosis: joint without swelling; ROM intact -gout: no warmth or erythema; ROM intact  -septic joint: afebrile; no warmth or erythema; no skin changes; ROM intact  -compartment syndrome: area not tense; neurovascularly intact   Social Determinants of  Health:  Geriatric         Final Clinical Impression(s) / ED Diagnoses Final diagnoses:  Closed nondisplaced fracture of fifth metatarsal bone of left foot, initial encounter    Rx / DC Orders ED Discharge Orders     None         Dorthy Cooler, [place] 08/16/23 1955    Franne Forts, DO 08/17/23 2016

## 2023-08-16 NOTE — ED Notes (Signed)
 Patient refused crutches because she has a walker at home she would prefer to use.

## 2023-08-16 NOTE — Discharge Instructions (Addendum)
 It was a pleasure caring for you today.  Please follow-up with orthopedic provider listed in this discharge paperwork in the next 48 to 72 hours.  Seek emergency care if experiencing any new or worsening symptoms.  Alternating between 650 mg Tylenol and 400 mg Advil: The best way to alternate taking Acetaminophen (example Tylenol) and Ibuprofen (example Advil/Motrin) is to take them 3 hours apart. For example, if you take ibuprofen at 6 am you can then take Tylenol at 9 am. You can continue this regimen throughout the day, making sure you do not exceed the recommended maximum dose for each drug.

## 2023-08-31 ENCOUNTER — Ambulatory Visit: Admitting: Orthopaedic Surgery

## 2023-09-05 ENCOUNTER — Ambulatory Visit: Payer: Medicare HMO | Admitting: Family Medicine

## 2023-09-10 ENCOUNTER — Ambulatory Visit
Admission: RE | Admit: 2023-09-10 | Discharge: 2023-09-10 | Disposition: A | Discharge status: Home / Self Care | Source: Ambulatory Visit | Attending: Registered Nurse | Admitting: Registered Nurse

## 2023-09-10 DIAGNOSIS — G3184 Mild cognitive impairment, so stated: Secondary | ICD-10-CM

## 2023-09-12 ENCOUNTER — Ambulatory Visit (INDEPENDENT_AMBULATORY_CARE_PROVIDER_SITE_OTHER): Admitting: Orthopaedic Surgery

## 2023-09-12 ENCOUNTER — Encounter: Payer: Self-pay | Admitting: Orthopaedic Surgery

## 2023-09-12 ENCOUNTER — Other Ambulatory Visit (INDEPENDENT_AMBULATORY_CARE_PROVIDER_SITE_OTHER)

## 2023-09-12 DIAGNOSIS — M79672 Pain in left foot: Secondary | ICD-10-CM | POA: Diagnosis not present

## 2023-09-12 NOTE — Progress Notes (Signed)
 Office Visit Note   Patient: Tiffany Murphy           Date of Birth: 04/08/1951           MRN: [id number] Visit Date: 09/12/2023              Requested by: No referring provider defined for this encounter. PCP: Cristino Martes, NP   Assessment & Plan: Visit Diagnoses:  1. Pain in left foot     Plan: Tiffany Murphy is a 73 year old female with subacute left fifth metatarsal fracture.  Fracture appears to be healing appropriately on x-rays.  Clinically she seems to be doing fine.  Activity modification discussed.  Recheck in 6 weeks with repeat radiographs of the left foot.  Follow-Up Instructions: Return in about 6 weeks (around 10/24/2023) for with lindsey.   Orders:  Orders Placed This Encounter  Procedures   XR Foot Complete Left   No orders of the defined types were placed in this encounter.     Procedures: No procedures performed   Clinical Data: No additional findings.   Subjective: Chief Complaint  Patient presents with   Left Foot - Pain    HPI Tiffany Murphy 73 year old female here for left foot fracture of the fifth metatarsal.  She fell at her home 1 month ago went to the ED on 08/16/2023.  She states that she was placed in a cast and she wore it for a day and then she took it off herself.  She reports that the pain is severe.  She has been wearing an Ace bandage.  She is currently wearing regular shoes.  She reports that the pain is severe. Review of Systems  Constitutional: Negative.   HENT: Negative.    Eyes: Negative.   Respiratory: Negative.    Cardiovascular: Negative.   Endocrine: Negative.   Musculoskeletal: Negative.   Neurological: Negative.   Hematological: Negative.   Psychiatric/Behavioral: Negative.    All other systems reviewed and are negative.    Objective: Vital Signs: There were no vitals taken for this visit.  Physical Exam Vitals and nursing note reviewed.  Constitutional:      Appearance: She is well-developed.  HENT:     Head:  Atraumatic.     Nose: Nose normal.  Eyes:     Extraocular Movements: Extraocular movements intact.  Cardiovascular:     Pulses: Normal pulses.  Pulmonary:     Effort: Pulmonary effort is normal.  Abdominal:     Palpations: Abdomen is soft.  Musculoskeletal:     Cervical back: Neck supple.  Skin:    General: Skin is warm.     Capillary Refill: Capillary refill takes less than 2 seconds.  Neurological:     Mental Status: She is alert. Mental status is at baseline.  Psychiatric:        Behavior: Behavior normal.        Thought Content: Thought content normal.        Judgment: Judgment normal.     Ortho Exam Exam of the left foot shows no swelling or bruising.  She has no tenderness at the fracture site.  No neurovascular compromise. Specialty Comments:  No specialty comments available.  Imaging: XR Foot Complete Left Result Date: 09/12/2023 X-rays of the left foot show subacute fifth metatarsal fracture with signs of early fracture healing.    PMFS History: Patient Active Problem List   Diagnosis Date Noted   Prediabetes 08/03/2022   Fracture of radial shaft, with ulna, right, open 06/17/2022  Open wound of right forearm due to dog bite 06/17/2022   Fracture of radial shaft, with ulna, left, open 06/17/2022   Skin lesion of chest wall 03/24/2022   Need for influenza vaccination 03/24/2022   Stress at home 12/22/2021   Slow transit constipation 12/22/2021   Colitis 03/17/2021   Sepsis (HCC) 03/17/2021   AKI (acute kidney injury) (HCC) 03/17/2021   Subjective memory complaints 02/21/2019   Frequency of urination- pt thinks due to HCTZ BP med 02/21/2019   Mixed hyperlipidemia 11/13/2018   Closed fracture of left distal radius 10/25/2018   Sleep difficulties- poor sleep hygeine 07/12/2018   Chronic pain disorder 07/12/2018   Chronic joint pain 07/12/2018   OSA (obstructive sleep apnea) 04/30/2018   Chronic fatigue 04/30/2018   Lung nodule seen on imaging study  04/30/2018   Vitamin D deficiency 04/30/2018   Elevated LDL cholesterol level 04/11/2018   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   GERD without esophagitis 04/11/2018   Obesity, Class I, BMI 30-34.9 04/11/2018   Glucose intolerance (impaired glucose tolerance) 04/11/2018   Irritable bowel syndrome 04/11/2018   Chronic bilateral upper abdominal pain 04/11/2018   Panic attacks 04/11/2018   Mood disorder (HCC) 04/11/2018   Hypokalemia 04/11/2018   Environmental and seasonal allergies 04/11/2018   Elevated coronary artery calcium score 12/01/2014   Essential hypertension 07/11/2013   Depression with anxiety 07/11/2013   Pure hypercholesterolemia 07/11/2013   Overweight 06/15/2013   Past Medical History:  Diagnosis Date   Allergy    "THINK I GOT THEM"   Anxiety    Arthritis    "BODY FULL OF ARTHRITIS"   Asthma    Bronchitis    Cancer (HCC)    "RIGHT LEG SQUAMOUS CELL"   Closed fracture of left distal radius 10/25/2018   COPD (chronic obstructive pulmonary disease) (HCC)    Depression    GERD (gastroesophageal reflux disease)    Headache    Hypercholesteremia    Hypertension    IBS (irritable bowel syndrome)    Pre-diabetes    Sleep apnea    Tobacco abuse    Vitamin D deficiency     Family History  Problem Relation Age of Onset   CAD Mother 57   Alzheimer's disease Father 55   Diabetes Father    Colon cancer Neg Hx    Esophageal cancer Neg Hx    Colon polyps Neg Hx    Rectal cancer Neg Hx    Stomach cancer Neg Hx    Breast cancer Neg Hx     Past Surgical History:  Procedure Laterality Date   APPENDECTOMY     COLONOSCOPY  08/26/2015   Colonic polyps status post polypectomy. Minimal sigmoid diverticulosis.    ESOPHAGOGASTRODUODENOSCOPY  02/25/2011   Large hiatal hernia otherwise normal EGD.   HARDWARE REMOVAL Right 08/16/2022   Procedure: PLATE REMOVAL;  Surgeon: Betha Loa, MD;  Location: [facility];  Service: Orthopedics;  Laterality: Right;   I &  D EXTREMITY Right 06/17/2022   Procedure: IRRIGATION AND DEBRIDEMENT RIGHT UPPER EXTREMITY;  Surgeon: Betha Loa, MD;  Location: [facility];  Service: Orthopedics;  Laterality: Right;   OPEN REDUCTION INTERNAL FIXATION (ORIF) DISTAL RADIAL FRACTURE Left 10/25/2018   Procedure: OPEN REDUCTION INTERNAL FIXATION (ORIF)LEFT  DISTAL RADIAL FRACTURE;  Surgeon: Teryl Lucy, MD;  Location: [facility];  Service: Orthopedics;  Laterality: Left;   OPEN REDUCTION INTERNAL FIXATION (ORIF) DISTAL RADIAL FRACTURE Right 07/28/2022   Procedure: OPEN REDUCTION INTERNAL FIXATION (ORIF)  RIGHT DISTAL RADIUS FRACTURE CLOSED TREATMENT OF DISTAL ULNA FRACTURE;  Surgeon: Betha Loa, MD;  Location: [facility];  Service: Orthopedics;  Laterality: Right;  120 MIN   OPEN REDUCTION INTERNAL FIXATION (ORIF) DISTAL RADIAL FRACTURE Right 08/16/2022   Procedure: OPEN REDUCTION INTERNAL FIXATION (ORIF) RIGHT RADIUS FRACTURE;  Surgeon: Betha Loa, MD;  Location: [facility];  Service: Orthopedics;  Laterality: Right;  90 MIN   POLYPECTOMY     VAGINAL HYSTERECTOMY     Social History   Occupational History   Not on file  Tobacco Use   Smoking status: Former    Current packs/day: 0.00    Average packs/day: 0.5 packs/day for 44.0 years (22.0 ttl pk-yrs)    Types: Cigarettes    Start date: 09/07/1978    Quit date: 09/07/2022    Years since quitting: 1.0    Passive exposure: Current   Smokeless tobacco: Never   Tobacco comments:    in process of quitting  Vaping Use   Vaping status: Former   Devices: did it for 6 months and stopped   Substance and Sexual Activity   Alcohol use: Yes   Drug use: Yes    Types: Marijuana    Comment: 2 puffs per day-WHEN SICK, last time a month ago   Sexual activity: Not Currently

## 2023-10-06 ENCOUNTER — Other Ambulatory Visit: Payer: Self-pay | Admitting: Family Medicine

## 2023-10-06 DIAGNOSIS — E785 Hyperlipidemia, unspecified: Secondary | ICD-10-CM

## 2023-10-10 ENCOUNTER — Encounter: Payer: Self-pay | Admitting: Pulmonary Disease

## 2023-10-10 ENCOUNTER — Ambulatory Visit: Payer: Medicare HMO | Admitting: Pulmonary Disease

## 2023-10-24 ENCOUNTER — Ambulatory Visit: Admitting: Physician Assistant

## 2023-11-10 ENCOUNTER — Other Ambulatory Visit: Payer: Self-pay | Admitting: Family Medicine

## 2023-11-10 DIAGNOSIS — F418 Other specified anxiety disorders: Secondary | ICD-10-CM

## 2023-11-21 ENCOUNTER — Encounter: Payer: Self-pay | Admitting: Physician Assistant

## 2023-12-07 ENCOUNTER — Encounter: Payer: Self-pay | Admitting: Physician Assistant

## 2024-01-02 ENCOUNTER — Encounter

## 2024-01-02 ENCOUNTER — Ambulatory Visit: Admitting: Pulmonary Disease

## 2024-01-02 ENCOUNTER — Encounter: Payer: Self-pay | Admitting: Pulmonary Disease

## 2024-01-08 ENCOUNTER — Telehealth: Payer: Self-pay | Admitting: Family Medicine

## 2024-01-08 NOTE — Telephone Encounter (Unsigned)
 Copied from CRM [id number]. Topic: Appointments - Transfer of Care >> Jan 08, 2024  2:45 PM Gennette ORN wrote: Pt is requesting to transfer FROM:Dr. Arloa Pt is requesting to transfer TO: Dr. Berneta  Reason for requested transfer: she like Dr. Berneta better  It is the responsibility of the team the patient would like to transfer to (Dr. Berneta) to reach out to the patient if for any reason this transfer is not acceptable.

## 2024-01-12 ENCOUNTER — Ambulatory Visit

## 2024-01-12 ENCOUNTER — Ambulatory Visit: Admitting: Physician Assistant

## 2024-02-02 ENCOUNTER — Ambulatory Visit: Admission: EM | Admit: 2024-02-02 | Discharge: 2024-02-02 | Disposition: A | Discharge status: Home / Self Care

## 2024-02-02 ENCOUNTER — Other Ambulatory Visit: Payer: Self-pay

## 2024-02-02 ENCOUNTER — Encounter: Payer: Self-pay | Admitting: *Deleted

## 2024-02-02 ENCOUNTER — Ambulatory Visit: Payer: Self-pay

## 2024-02-02 DIAGNOSIS — E860 Dehydration: Secondary | ICD-10-CM

## 2024-02-02 DIAGNOSIS — I679 Cerebrovascular disease, unspecified: Secondary | ICD-10-CM | POA: Diagnosis not present

## 2024-02-02 DIAGNOSIS — R2689 Other abnormalities of gait and mobility: Secondary | ICD-10-CM | POA: Diagnosis not present

## 2024-02-02 DIAGNOSIS — R296 Repeated falls: Secondary | ICD-10-CM | POA: Diagnosis not present

## 2024-02-02 DIAGNOSIS — R4181 Age-related cognitive decline: Secondary | ICD-10-CM | POA: Diagnosis not present

## 2024-02-02 DIAGNOSIS — R42 Dizziness and giddiness: Secondary | ICD-10-CM

## 2024-02-02 LAB — POCT URINE DIPSTICK
Blood, UA: NEGATIVE
Glucose, UA: NEGATIVE mg/dL
Leukocytes, UA: NEGATIVE
Nitrite, UA: NEGATIVE
POC PROTEIN,UA: 30 — AB
Spec Grav, UA: >=1.03 — AB (ref 1.010–1.025)
Urobilinogen, UA: 1 U/dL
pH, UA: 7 (ref 5.0–8.0)

## 2024-02-02 LAB — GLUCOSE, POCT (MANUAL RESULT ENTRY): POC Glucose: 83 mg/dL (ref 70–99)

## 2024-02-02 NOTE — ED Triage Notes (Signed)
 Pt brought in by her family. They voice concern that she may have had a stroke. Pt reports she has frequent falls for about 4 years. States she gets dizzy at times. She missed her spring PCP appt because she was dealing with the stress of her husband's death. States these symptoms (dizziness, falls) were worse during this time but don't come as often now. Her daughter states she thought she noticed some facial asymmetry this morning (around 10am) which concerned her but seems to be resolved at present. Pt is cao x 4, speech clear, grips strong and equal, facial symmetry present. She also states she may have mild dementia per MRI within the last year. Endorses headache I get them every day.

## 2024-02-02 NOTE — Telephone Encounter (Signed)
 Lvmtcb she can schedule an OV with Dr Berneta.
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
## 2024-02-02 NOTE — Telephone Encounter (Signed)
 Reason for Disposition  MILD weakness (e.g., does not interfere with ability to work, go to school, normal activities)  (Exception: Mild weakness is a chronic symptom.)  Additional Information  Commented on: Answer Assessment    Daughter states that pt was recently diagnoses with MCI and she just wants the best care for her. States that she is calling to transfer PCP and would like to see Dr. Berneta as pt was previous one of his pts.  Protocols used: Falls and Falling-A-[facility]   Pt daughter called to re-establish care, scheduled for a transfer of care appt with Dr. Sebastian for first available, instructed daughter to reach out to current pcp to have pt seen .

## 2024-02-02 NOTE — Telephone Encounter (Signed)
 FYI Only or Action Required?: FYI only for provider.  Patient was last seen in primary care on 06/08/2023 by Berneta Elsie Sayre, MD.  Called Nurse Triage reporting Fall.  Symptoms began yesterday.  Interventions attempted: Nothing.  Symptoms are: unchanged.  Triage Disposition: See PCP When Office is Open (Within 3 Days)  Patient/caregiver understands and will follow disposition?: Yes

## 2024-02-02 NOTE — ED Provider Notes (Signed)
 [facility]    CSN: [id number] Arrival date & time: 02/02/24  1423      History   Chief Complaint Chief Complaint  Patient presents with   Weakness    HPI Tiffany Murphy is a 73 y.o. female.   Discussed the use of AI scribe software for clinical note transcription with the patient, who gave verbal consent to proceed.   History provided by patient and her daughter   The patient, a woman with a history of mild dementia and chronic small vessel ischemic changes on brain MRI 09/2023, presents with concerns due to recent falls and dizziness. She reports two recent falls occurring in the past two days.  The patient experienced sharp chest pains a couple of weeks ago, which have since resolved. She describes two recent fall incidents: one where she became dizzy and fell, and another where she fell without preceding dizziness. She attributes her falls to poor circulation in her legs and weakness in her knees and ankles, which causes her to trip and reach for support quickly. She also reports that she loses her balance often, especially when getting up from a standing position.   Recent life events include the passing of her husband, which caused her to miss her last primary care appointment. The patient currently lives with her daughter and a friend in a home without stairs. She has an upcoming appointment with her primary care provider scheduled for September 15th.  The following portions of the patient's history were reviewed and updated as appropriate: allergies, current medications, past family history, past medical history, past social history, past surgical history, and problem list.        Past Medical History:  Diagnosis Date   Allergy    THINK I GOT THEM   Anxiety    Arthritis    BODY FULL OF ARTHRITIS   Asthma    Bronchitis    Cancer (HCC)    RIGHT LEG SQUAMOUS CELL   Closed fracture of left distal radius 10/25/2018   COPD (chronic obstructive  pulmonary disease) (HCC)    Depression    GERD (gastroesophageal reflux disease)    Headache    Hypercholesteremia    Hypertension    IBS (irritable bowel syndrome)    Pre-diabetes    Sleep apnea    Tobacco abuse    Vitamin D  deficiency     Patient Active Problem List   Diagnosis Date Noted   Prediabetes 08/03/2022   Fracture of radial shaft, with ulna, right, open 06/17/2022   Open wound of right forearm due to dog bite 06/17/2022   Fracture of radial shaft, with ulna, left, open 06/17/2022   Skin lesion of chest wall 03/24/2022   Need for influenza vaccination 03/24/2022   Stress at home 12/22/2021   Slow transit constipation 12/22/2021   Colitis 03/17/2021   Sepsis (HCC) 03/17/2021   AKI (acute kidney injury) (HCC) 03/17/2021   Lumbar radiculopathy 12/17/2020   Degenerative spondylolisthesis 11/13/2020   Pain of lumbar spine 11/13/2020   Subjective memory complaints 02/21/2019   Frequency of urination- pt thinks due to HCTZ BP med 02/21/2019   Increased frequency of urination 02/21/2019   Mixed hyperlipidemia 11/13/2018   Hyperlipidemia 11/13/2018   Closed fracture of left distal radius 10/25/2018   Closed fracture of distal end of left radius 10/25/2018   Sleep difficulties- poor sleep hygeine 07/12/2018   Chronic pain disorder 07/12/2018   Chronic joint pain 07/12/2018   Chronic pain syndrome 07/12/2018  Arthralgia 07/12/2018   Difficulty sleeping 07/12/2018   OSA (obstructive sleep apnea) 04/30/2018   Chronic fatigue 04/30/2018   Lung nodule seen on imaging study 04/30/2018   Vitamin D  deficiency 04/30/2018   Fatigue 04/30/2018   Lung nodule 04/30/2018   Obstructive sleep apnea syndrome 04/30/2018   Elevated LDL cholesterol level 04/11/2018   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   GERD without esophagitis 04/11/2018   Obesity, Class I, BMI 30-34.9 04/11/2018   Glucose intolerance (impaired glucose tolerance) 04/11/2018   Irritable bowel syndrome 04/11/2018    Chronic bilateral upper abdominal pain 04/11/2018   Panic attacks 04/11/2018   Mood disorder (HCC) 04/11/2018   Hypokalemia 04/11/2018   Environmental and seasonal allergies 04/11/2018   Chronic abdominal pain 04/11/2018   Asthma 04/11/2018   Chronic obstructive pulmonary disease (HCC) 04/11/2018   Elevated low density lipoprotein (LDL) cholesterol level 04/11/2018   Predisposition to allergic reaction 04/11/2018   Impaired glucose tolerance 04/11/2018   Gastroesophageal reflux disease 04/11/2018   Class 1 obesity 04/11/2018   Panic attack 04/11/2018   Tobacco user 04/11/2018   Elevated coronary artery calcium  score 12/01/2014   Calcification of coronary artery 12/01/2014   Essential hypertension 07/11/2013   Depression with anxiety 07/11/2013   Pure hypercholesterolemia 07/11/2013   Mixed anxiety and depressive disorder 07/11/2013   Benign essential hypertension 07/11/2013   Overweight 06/15/2013   Nicotine dependence 06/15/2013    Past Surgical History:  Procedure Laterality Date   APPENDECTOMY     COLONOSCOPY  08/26/2015   Colonic polyps status post polypectomy. Minimal sigmoid diverticulosis.    ESOPHAGOGASTRODUODENOSCOPY  02/25/2011   Large hiatal hernia otherwise normal EGD.   HARDWARE REMOVAL Right 08/16/2022   Procedure: PLATE REMOVAL;  Surgeon: Murrell Drivers, MD;  Location: [facility];  Service: Orthopedics;  Laterality: Right;   I & D EXTREMITY Right 06/17/2022   Procedure: IRRIGATION AND DEBRIDEMENT RIGHT UPPER EXTREMITY;  Surgeon: Murrell Drivers, MD;  Location: [facility];  Service: Orthopedics;  Laterality: Right;   OPEN REDUCTION INTERNAL FIXATION (ORIF) DISTAL RADIAL FRACTURE Left 10/25/2018   Procedure: OPEN REDUCTION INTERNAL FIXATION (ORIF)LEFT  DISTAL RADIAL FRACTURE;  Surgeon: Josefina Chew, MD;  Location: [facility];  Service: Orthopedics;  Laterality: Left;   OPEN REDUCTION INTERNAL FIXATION (ORIF) DISTAL RADIAL FRACTURE Right  07/28/2022   Procedure: OPEN REDUCTION INTERNAL FIXATION (ORIF) RIGHT DISTAL RADIUS FRACTURE CLOSED TREATMENT OF DISTAL ULNA FRACTURE;  Surgeon: Murrell Drivers, MD;  Location: [facility];  Service: Orthopedics;  Laterality: Right;  120 MIN   OPEN REDUCTION INTERNAL FIXATION (ORIF) DISTAL RADIAL FRACTURE Right 08/16/2022   Procedure: OPEN REDUCTION INTERNAL FIXATION (ORIF) RIGHT RADIUS FRACTURE;  Surgeon: Murrell Drivers, MD;  Location: [facility];  Service: Orthopedics;  Laterality: Right;  90 MIN   POLYPECTOMY     VAGINAL HYSTERECTOMY      OB History   No obstetric history on file.      Home Medications    Prior to Admission medications   Medication Sig Start Date End Date Taking? Authorizing Provider  albuterol  (PROVENTIL ) (2.5 MG/3ML) 0.083% nebulizer solution Take 3 mLs (2.5 mg total) by nebulization every 6 (six) hours as needed for wheezing or shortness of breath. 12/09/22  Yes Berneta Elsie Sayre, MD  albuterol  (VENTOLIN  HFA) 108 [phone] Base) MCG/ACT inhaler Inhale 2 puffs into the lungs every 6 (six) hours as needed for wheezing. 10/15/21  Yes Tanda Bleacher, MD  amLODipine  (NORVASC ) 5 MG tablet TAKE 1 TABLET  EVERY DAY 03/21/23  Yes Berneta Elsie Sayre, MD  buPROPion  (WELLBUTRIN  XL) 300 MG 24 hr tablet Take 1 tablet (300 mg total) by mouth daily. 06/08/23  Yes Berneta Elsie Sayre, MD  fluticasone  (FLONASE ) 50 MCG/ACT nasal spray Place 2 sprays into each nostril once a day 12/09/22  Yes Berneta Elsie Sayre, MD  Fluticasone -Umeclidin-Vilant (TRELEGY ELLIPTA ) 100-62.5-25 MCG/ACT AEPB Inhale 1 puff into the lungs daily. 07/05/23  Yes Olalere, Adewale A, MD  gabapentin  (NEURONTIN ) 300 MG capsule TAKE 1 CAPSULE BY MOUTH IN THE MORNING AND 1 IN THE EVENING AND 2 AT BEDTIME 12/30/22  Yes Webb, Padonda B, FNP  LORazepam  (ATIVAN ) 0.5 MG tablet Take 0.5 mg by mouth every 6 (six) hours as needed.   Yes [provider]  metFORMIN  (GLUCOPHAGE -XR) 500 MG 24 hr  tablet TAKE 1 TABLET EVERY DAY WITH BREAKFAST 03/28/23  Yes Berneta Elsie Sayre, MD  omeprazole  (PRILOSEC) 40 MG capsule TAKE 1 CAPSULE EVERY DAY 04/06/23  Yes Berneta Elsie Sayre, MD  polyethylene glycol powder (GLYCOLAX /MIRALAX ) 17 GM/SCOOP powder Dissolve 1 scoop of medication in water daily as needed for constipation. 12/22/21  Yes Berneta Elsie Sayre, MD  QUEtiapine  (SEROQUEL ) 50 MG tablet Take 1 tablet (50 mg total) by mouth at bedtime. 06/08/23  Yes Berneta Elsie Sayre, MD  rosuvastatin  (CRESTOR ) 40 MG tablet TAKE 1/2 TABLET AT BEDTIME 06/08/23  Yes Berneta Elsie Sayre, MD  sertraline  (ZOLOFT ) 100 MG tablet Take 2 tablets by mouth once daily 11/10/23  Yes Berneta Elsie Sayre, MD  acyclovir  (ZOVIRAX ) 200 MG capsule Take 200 mg by mouth daily as needed (for break out). 08/17/21   [provider]  buPROPion  (ZYBAN ) 150 MG 12 hr tablet Take 150 mg by mouth daily. Patient not taking: Reported on 02/02/2024    [provider]  Cyanocobalamin (VITAMIN B 12 PO) Take 1 tablet by mouth daily. Patient not taking: Reported on 02/02/2024    [provider]  hydrocortisone  (ANUSOL -HC) 2.5 % rectal cream Insert and apply rectally 2 times a day for 10 days Patient taking differently: Place 1 Application rectally daily as needed for hemorrhoids. 08/31/21     losartan  (COZAAR ) 100 MG tablet Take 1 tablet by mouth once daily Patient not taking: Reported on 02/02/2024 06/02/22   Berneta Elsie Sayre, MD  montelukast  (SINGULAIR ) 10 MG tablet Take 1 tablet (10 mg total) by mouth at bedtime. Patient not taking: Reported on 02/02/2024 04/04/22   Berneta Elsie Sayre, MD  Multiple Vitamin (MULTIVITAMIN WITH MINERALS) TABS tablet Take 1 tablet by mouth daily.    [provider]  Omega-3 Fatty Acids (FISH OIL) 1000 MG CPDR Take 1,000 mg by mouth daily.    [provider]  oxyCODONE  (ROXICODONE ) 5 MG immediate release tablet Take 1 tablet (5 mg total) by mouth  every 4 (four) hours as needed for up to 5 doses for severe pain (pain score 7-10). Patient not taking: Reported on 02/02/2024 08/16/23   Hoy Nidia FALCON, PA-C    Family History Family History  Problem Relation Age of Onset   CAD Mother 31   Alzheimer's disease Father 18   Diabetes Father    Colon cancer Neg Hx    Esophageal cancer Neg Hx    Colon polyps Neg Hx    Rectal cancer Neg Hx    Stomach cancer Neg Hx    Breast cancer Neg Hx     Social History Social History   Tobacco Use   Smoking status: Former  Current packs/day: 0.00    Average packs/day: 0.5 packs/day for 44.0 years (22.0 ttl pk-yrs)    Types: Cigarettes    Start date: 09/07/1978    Quit date: 09/07/2022    Years since quitting: 1.4    Passive exposure: Current   Smokeless tobacco: Never   Tobacco comments:    in process of quitting  Vaping Use   Vaping status: Former   Devices: did it for 6 months and stopped   Substance Use Topics   Alcohol use: Not Currently   Drug use: Not Currently    Types: Marijuana    Comment: 2 puffs per day-WHEN SICK, last time a month ago     Allergies   Penicillins, Lisinopril , Lovastatin, and Other   Review of Systems Review of Systems  Constitutional: Negative.   HENT: Negative.    Eyes:  Negative for visual disturbance.  Respiratory:  Negative for cough and shortness of breath.   Cardiovascular:  Positive for chest pain. Negative for palpitations and leg swelling.  Musculoskeletal:  Positive for arthralgias and gait problem (balance issues).  Neurological:  Positive for dizziness, weakness and headaches. Negative for numbness.  Psychiatric/Behavioral:  Positive for confusion (recent dx mild dementia).   All other systems reviewed and are negative.    Physical Exam Triage Vital Signs ED Triage Vitals  Encounter Vitals Group     BP 02/02/24 1432 123/72     Girls Systolic BP Percentile --      Girls Diastolic BP Percentile --      Boys Systolic BP  Percentile --      Boys Diastolic BP Percentile --      Pulse Rate 02/02/24 1432 68     Resp 02/02/24 1432 16     Temp 02/02/24 1432 98.2 F (36.8 C)     Temp Source 02/02/24 1432 Oral     SpO2 02/02/24 1432 94 %     Weight --      Height --      Head Circumference --      Peak Flow --      Pain Score 02/02/24 1445 3     Pain Loc --      Pain Education --      Exclude from Growth Chart --    No data found.  Updated Vital Signs BP 123/72 (BP Location: Left Arm)   Pulse 68   Temp 98.2 F (36.8 C) (Oral)   Resp 16   SpO2 94%   Visual Acuity Right Eye Distance:   Left Eye Distance:   Bilateral Distance:    Right Eye Near:   Left Eye Near:    Bilateral Near:     Physical Exam Vitals reviewed.  Constitutional:      General: She is awake. She is not in acute distress.    Appearance: Normal appearance. She is well-developed. She is not ill-appearing, toxic-appearing or diaphoretic.     Comments: Frail elderly woman, chronically ill appearing but in no acute distress  HENT:     Head: Normocephalic.     Right Ear: Hearing normal.     Left Ear: Hearing normal.     Nose: Nose normal.     Mouth/Throat:     Mouth: Mucous membranes are moist.  Eyes:     General: Vision grossly intact.     Conjunctiva/sclera: Conjunctivae normal.  Cardiovascular:     Rate and Rhythm: Normal rate and regular rhythm.     Heart sounds: Normal  heart sounds.  Pulmonary:     Effort: Pulmonary effort is normal.     Breath sounds: Normal breath sounds and air entry.  Abdominal:     Palpations: Abdomen is soft.  Musculoskeletal:        General: Normal range of motion.     Cervical back: Full passive range of motion without pain, normal range of motion and neck supple.     Right lower leg: No edema.     Left lower leg: No edema.  Skin:    General: Skin is warm and dry.  Neurological:     General: No focal deficit present.     Mental Status: She is alert and oriented to person, place, and  time.     GCS: GCS eye subscore is 4. GCS verbal subscore is 5. GCS motor subscore is 6.     Sensory: Sensation is intact. No sensory deficit.     Motor: Weakness (Generalized no focal areas of weakness) present.     Coordination: Romberg sign positive (Some swaying and loss of balance towards the end of the 20 seconds). Finger-Nose-Finger Test normal.     Gait: Gait is intact.  Psychiatric:        Mood and Affect: Mood normal.        Speech: Speech normal.        Behavior: Behavior is cooperative.      UC Treatments / Results  Labs (all labs ordered are listed, but only abnormal results are displayed) Labs Reviewed  POCT URINE DIPSTICK - Abnormal; Notable for the following components:      Result Value   Color, UA other (*)    Clarity, UA cloudy (*)    Bilirubin, UA small (*)    Ketones, POC UA trace (5) (*)    Spec Grav, UA >=1.030 (*)    POC PROTEIN,UA =30 (*)    All other components within normal limits  GLUCOSE, POCT (MANUAL RESULT ENTRY) - Normal  CBC WITH DIFFERENTIAL/PLATELET  COMPREHENSIVE METABOLIC PANEL WITH GFR    EKG   Radiology No results found.  Procedures Procedures (including critical care time)  Medications Ordered in UC Medications - No data to display  Initial Impression / Assessment and Plan / UC Course  I have reviewed the triage vital signs and the nursing notes.  Pertinent labs & imaging results that were available during my care of the patient were reviewed by me and considered in my medical decision making (see chart for details).     Patient is a 73 year old female with a recent diagnosis of mild dementia presenting with dizziness, balance issues, and two falls over the past two days--one associated with dizziness and the other without apparent cause. She denies recent illness, fever, shortness of breath, palpitations, vision changes, headaches, numbness, tingling, or speech difficulty. No injuries or new pain from the recent falls were  reported. MRI in March showed chronic small vessel ischemic changes since 2019 without evidence of acute stroke. Differential includes orthostatic hypotension, urinary tract infection, dehydration, electrolyte imbalance, and progression of small vessel disease. Urinalysis, CBC, and CMP ordered, and blood glucose checked in the office. Patient was educated on fall prevention strategies and advised to follow up with PCP as scheduled on September 15th. Regarding mild dementia, she remains able to recall three words after a short delay on cognitive screening. MRI findings are unchanged from 2019. She will continue follow-up with her primary care provider for ongoing management and maintain home safety measures,  including living with family and avoiding stair use. ED precautions discussed for new neurological symptoms, chest pain, or inability to walk safely.  Today's evaluation has revealed no signs of a dangerous process. Discussed diagnosis with patient and/or guardian. Patient and/or guardian aware of their diagnosis, possible red flag symptoms to watch out for and need for close follow up. Patient and/or guardian understands verbal and written discharge instructions. Patient and/or guardian comfortable with plan and disposition.  Patient and/or guardian has a clear mental status at this time, good insight into illness (after discussion and teaching) and has clear judgment to make decisions regarding their care  Documentation was completed with the aid of voice recognition software. Transcription may contain typographical errors. Final Clinical Impressions(s) / UC Diagnoses   Final diagnoses:  Dizziness  Balance problem  Frequent falls  Cerebrovascular small vessel disease  Age-related cognitive decline  Mild dehydration     Discharge Instructions      You were seen today for dizziness, balance problems, and recent falls. Your urinalysis was negative for urinary tract infection. You do have mild  dehydration, which may be contributing to your symptoms. It is important to drink plenty of fluids throughout the day so that your urine stays clear, pale, and yellow. Continue eating regular meals and avoid skipping food, as this can also worsen dizziness. Take your time when standing up from sitting or lying down, and use stable support if needed to prevent falls. Make sure your home environment is safe by keeping walkways clear, using good lighting, and wearing shoes with good grip. You have a scheduled follow-up with your primary care provider on September 15th, and you should keep that appointment for ongoing evaluation and management. Seek medical attention immediately if you experience new or worsening dizziness, weakness, numbness, speech changes, vision changes, chest pain, shortness of breath, severe headache, confusion, or if you have another fall that causes injury.      ED Prescriptions   None    PDMP not reviewed this encounter.   Iola Lukes, [place] 02/02/24 [id number]

## 2024-02-02 NOTE — Discharge Instructions (Addendum)
 You were seen today for dizziness, balance problems, and recent falls. Your urinalysis was negative for urinary tract infection. You do have mild dehydration, which may be contributing to your symptoms. It is important to drink plenty of fluids throughout the day so that your urine stays clear, pale, and yellow. Continue eating regular meals and avoid skipping food, as this can also worsen dizziness. Take your time when standing up from sitting or lying down, and use stable support if needed to prevent falls. Make sure your home environment is safe by keeping walkways clear, using good lighting, and wearing shoes with good grip. You have a scheduled follow-up with your primary care provider on September 15th, and you should keep that appointment for ongoing evaluation and management. Seek medical attention immediately if you experience new or worsening dizziness, weakness, numbness, speech changes, vision changes, chest pain, shortness of breath, severe headache, confusion, or if you have another fall that causes injury.

## 2024-02-03 LAB — CBC WITH DIFFERENTIAL/PLATELET
Basophils Absolute: 0.1 x10E3/uL (ref 0.0–0.2)
Basos: 1 %
EOS (ABSOLUTE): 0.1 x10E3/uL (ref 0.0–0.4)
Eos: 2 %
Hematocrit: 45.2 % (ref 34.0–46.6)
Hemoglobin: 14.2 g/dL (ref 11.1–15.9)
Immature Grans (Abs): 0 x10E3/uL (ref 0.0–0.1)
Immature Granulocytes: 0 %
Lymphocytes Absolute: 1.3 x10E3/uL (ref 0.7–3.1)
Lymphs: 22 %
MCH: 29.5 pg (ref 26.6–33.0)
MCHC: 31.4 g/dL — ABNORMAL LOW (ref 31.5–35.7)
MCV: 94 fL (ref 79–97)
Monocytes Absolute: 0.5 x10E3/uL (ref 0.1–0.9)
Monocytes: 8 %
Neutrophils Absolute: 4 x10E3/uL (ref 1.4–7.0)
Neutrophils: 67 %
Platelets: 187 x10E3/uL (ref 150–450)
RBC: 4.81 x10E6/uL (ref 3.77–5.28)
RDW: 13 % (ref 11.7–15.4)
WBC: 6 x10E3/uL (ref 3.4–10.8)

## 2024-02-03 LAB — COMPREHENSIVE METABOLIC PANEL WITH GFR
ALT: 15 IU/L (ref 0–32)
AST: 23 IU/L (ref 0–40)
Albumin: 4.3 g/dL (ref 3.8–4.8)
Alkaline Phosphatase: 90 IU/L (ref 44–121)
BUN/Creatinine Ratio: 19 (ref 12–28)
BUN: 18 mg/dL (ref 8–27)
Bilirubin Total: 0.3 mg/dL (ref 0.0–1.2)
CO2: 23 mmol/L (ref 20–29)
Calcium: 9.4 mg/dL (ref 8.7–10.3)
Chloride: 107 mmol/L — ABNORMAL HIGH (ref 96–106)
Creatinine, Ser: 0.97 mg/dL (ref 0.57–1.00)
Globulin, Total: 2.3 g/dL (ref 1.5–4.5)
Glucose: 78 mg/dL (ref 70–99)
Potassium: 4.5 mmol/L (ref 3.5–5.2)
Sodium: 144 mmol/L (ref 134–144)
Total Protein: 6.6 g/dL (ref 6.0–8.5)
eGFR: 61 mL/min/1.73 (ref >59)

## 2024-02-05 ENCOUNTER — Ambulatory Visit (HOSPITAL_COMMUNITY): Payer: Self-pay

## 2024-02-07 ENCOUNTER — Other Ambulatory Visit: Payer: Self-pay | Admitting: Family Medicine

## 2024-02-07 DIAGNOSIS — F418 Other specified anxiety disorders: Secondary | ICD-10-CM

## 2024-02-08 DIAGNOSIS — N1831 Chronic kidney disease, stage 3a: Secondary | ICD-10-CM | POA: Diagnosis not present

## 2024-02-08 DIAGNOSIS — E559 Vitamin D deficiency, unspecified: Secondary | ICD-10-CM | POA: Diagnosis not present

## 2024-02-08 DIAGNOSIS — R5383 Other fatigue: Secondary | ICD-10-CM | POA: Diagnosis not present

## 2024-02-08 DIAGNOSIS — F01A0 Vascular dementia, mild, without behavioral disturbance, psychotic disturbance, mood disturbance, and anxiety: Secondary | ICD-10-CM | POA: Diagnosis not present

## 2024-02-08 DIAGNOSIS — R296 Repeated falls: Secondary | ICD-10-CM | POA: Diagnosis not present

## 2024-02-08 DIAGNOSIS — E1142 Type 2 diabetes mellitus with diabetic polyneuropathy: Secondary | ICD-10-CM | POA: Diagnosis not present

## 2024-02-08 DIAGNOSIS — Z79899 Other long term (current) drug therapy: Secondary | ICD-10-CM | POA: Diagnosis not present

## 2024-02-08 DIAGNOSIS — F330 Major depressive disorder, recurrent, mild: Secondary | ICD-10-CM | POA: Diagnosis not present

## 2024-02-08 DIAGNOSIS — I129 Hypertensive chronic kidney disease with stage 1 through stage 4 chronic kidney disease, or unspecified chronic kidney disease: Secondary | ICD-10-CM | POA: Diagnosis not present

## 2024-02-08 DIAGNOSIS — J449 Chronic obstructive pulmonary disease, unspecified: Secondary | ICD-10-CM | POA: Diagnosis not present

## 2024-02-08 DIAGNOSIS — J302 Other seasonal allergic rhinitis: Secondary | ICD-10-CM | POA: Diagnosis not present

## 2024-02-08 DIAGNOSIS — E669 Obesity, unspecified: Secondary | ICD-10-CM | POA: Diagnosis not present

## 2024-02-23 ENCOUNTER — Ambulatory Visit: Admitting: Physician Assistant

## 2024-02-23 ENCOUNTER — Ambulatory Visit

## 2024-02-26 ENCOUNTER — Encounter: Payer: Self-pay | Admitting: Physician Assistant

## 2024-03-04 ENCOUNTER — Ambulatory Visit: Admitting: Pulmonary Disease

## 2024-03-04 ENCOUNTER — Encounter: Payer: Self-pay | Admitting: Pulmonary Disease

## 2024-03-04 ENCOUNTER — Telehealth: Payer: Self-pay | Admitting: Pulmonary Disease

## 2024-03-04 VITALS — BP 113/71 | HR 72 | Temp 98.4°F | Ht 62.0 in | Wt 163.4 lb

## 2024-03-04 DIAGNOSIS — R918 Other nonspecific abnormal finding of lung field: Secondary | ICD-10-CM | POA: Diagnosis not present

## 2024-03-04 DIAGNOSIS — J441 Chronic obstructive pulmonary disease with (acute) exacerbation: Secondary | ICD-10-CM

## 2024-03-04 DIAGNOSIS — G479 Sleep disorder, unspecified: Secondary | ICD-10-CM | POA: Diagnosis not present

## 2024-03-04 DIAGNOSIS — J42 Unspecified chronic bronchitis: Secondary | ICD-10-CM

## 2024-03-04 DIAGNOSIS — J449 Chronic obstructive pulmonary disease, unspecified: Secondary | ICD-10-CM | POA: Diagnosis not present

## 2024-03-04 DIAGNOSIS — F17210 Nicotine dependence, cigarettes, uncomplicated: Secondary | ICD-10-CM | POA: Diagnosis not present

## 2024-03-04 LAB — PULMONARY FUNCTION TEST
DL/VA % pred: 92 %
DL/VA: 3.88 ml/min/mmHg/L
DLCO cor % pred: 75 %
DLCO cor: 13.54 ml/min/mmHg
DLCO unc % pred: 77 %
DLCO unc: 13.86 ml/min/mmHg
FEF 25-75 Post: 1.28 L/s
FEF 25-75 Pre: 0.54 L/s
FEF2575-%Change-Post: 136 %
FEF2575-%Pred-Post: 80 %
FEF2575-%Pred-Pre: 34 %
FEV1-%Change-Post: 31 %
FEV1-%Pred-Post: 77 %
FEV1-%Pred-Pre: 59 %
FEV1-Post: 1.52 L
FEV1-Pre: 1.16 L
FEV1FVC-%Change-Post: 3 %
FEV1FVC-%Pred-Pre: 78 %
FEV6-%Change-Post: 29 %
FEV6-%Pred-Post: 98 %
FEV6-%Pred-Pre: 76 %
FEV6-Post: 2.45 L
FEV6-Pre: 1.89 L
FEV6FVC-%Change-Post: 2 %
FEV6FVC-%Pred-Post: 103 %
FEV6FVC-%Pred-Pre: 101 %
FVC-%Change-Post: 26 %
FVC-%Pred-Post: 95 %
FVC-%Pred-Pre: 75 %
FVC-Post: 2.48 L
FVC-Pre: 1.96 L
Post FEV1/FVC ratio: 61 %
Post FEV6/FVC ratio: 99 %
Pre FEV1/FVC ratio: 59 %
Pre FEV6/FVC Ratio: 97 %
RV % pred: 112 %
RV: 2.44 L
TLC % pred: 102 %
TLC: 4.85 L

## 2024-03-04 NOTE — Progress Notes (Signed)
 Full PFT performed today.

## 2024-03-04 NOTE — Patient Instructions (Signed)
 Follow-up in 6 months  Continue Trelegy  Continue albuterol  as needed  Graded activities as tolerated  Call us  with significant concerns  Continue to work on quitting smoking  I did place a referral for the pharmacist to assist

## 2024-03-04 NOTE — Progress Notes (Signed)
 Tiffany Murphy    [id number]    [birth year]  Primary Care Physician:Harris, Daphne, NP  Referring Physician: Neda Jennet LABOR, MD [address] [place],  [place] [address]  Chief complaint:   Patient being seen for follow-up for COPD  HPI:  Has been doing relatively well Was able to stay off cigarettes for about 14 months, lost her spouse and started smoking again  She felt really good when she was off cigarettes for a while  Continues to use Trelegy regularly Occasional use of albuterol   Occasional cough, not bringing up any secretions  Willing to try to quit again  Diagnosed with mild sleep apnea in the past Did use CPAP for a while but has not used CPAP for few years now and does not want to go back to using CPAP Not concerned about sleep apnea at present  She has a history of hypertension, mood disorder, panic attacks Hyperlipidemia Lung nodule noted in the past  History of chronic fatigue  Outpatient Encounter Medications as of 03/04/2024  Medication Sig   acyclovir  (ZOVIRAX ) 200 MG capsule Take 200 mg by mouth daily as needed (for break out).   albuterol  (PROVENTIL ) (2.5 MG/3ML) 0.083% nebulizer solution Take 3 mLs (2.5 mg total) by nebulization every 6 (six) hours as needed for wheezing or shortness of breath.   albuterol  (VENTOLIN  HFA) 108 (90 Base) MCG/ACT inhaler Inhale 2 puffs into the lungs every 6 (six) hours as needed for wheezing.   amLODipine  (NORVASC ) 5 MG tablet TAKE 1 TABLET EVERY DAY   buPROPion  (WELLBUTRIN  XL) 300 MG 24 hr tablet Take 1 tablet (300 mg total) by mouth daily.   buPROPion  (ZYBAN ) 150 MG 12 hr tablet Take 150 mg by mouth daily.   Cyanocobalamin (VITAMIN B 12 PO) Take 1 tablet by mouth daily.   fluticasone  (FLONASE ) 50 MCG/ACT nasal spray Place 2 sprays into each nostril once a day   Fluticasone -Umeclidin-Vilant (TRELEGY ELLIPTA ) 100-62.5-25 MCG/ACT AEPB Inhale 1 puff into the lungs daily.   gabapentin  (NEURONTIN ) 300  MG capsule TAKE 1 CAPSULE BY MOUTH IN THE MORNING AND 1 IN THE EVENING AND 2 AT BEDTIME   hydrocortisone  (ANUSOL -HC) 2.5 % rectal cream Insert and apply rectally 2 times a day for 10 days (Patient taking differently: Place 1 Application rectally daily as needed for hemorrhoids.)   LORazepam  (ATIVAN ) 0.5 MG tablet Take 0.5 mg by mouth every 6 (six) hours as needed.   losartan  (COZAAR ) 100 MG tablet Take 1 tablet by mouth once daily   metFORMIN  (GLUCOPHAGE -XR) 500 MG 24 hr tablet TAKE 1 TABLET EVERY DAY WITH BREAKFAST   montelukast  (SINGULAIR ) 10 MG tablet Take 1 tablet (10 mg total) by mouth at bedtime.   Multiple Vitamin (MULTIVITAMIN WITH MINERALS) TABS tablet Take 1 tablet by mouth daily.   Omega-3 Fatty Acids (FISH OIL) 1000 MG CPDR Take 1,000 mg by mouth daily.   omeprazole  (PRILOSEC) 40 MG capsule TAKE 1 CAPSULE EVERY DAY   polyethylene glycol powder (GLYCOLAX /MIRALAX ) 17 GM/SCOOP powder Dissolve 1 scoop of medication in water daily as needed for constipation.   QUEtiapine  (SEROQUEL ) 50 MG tablet Take 1 tablet (50 mg total) by mouth at bedtime.   rosuvastatin  (CRESTOR ) 40 MG tablet TAKE 1/2 TABLET AT BEDTIME   sertraline  (ZOLOFT ) 100 MG tablet Take 2 tablets by mouth once daily   oxyCODONE  (ROXICODONE ) 5 MG immediate release tablet Take 1 tablet (5 mg total) by mouth every 4 (four) hours  as needed for up to 5 doses for severe pain (pain score 7-10). (Patient not taking: Reported on 02/02/2024)   No facility-administered encounter medications on file as of 03/04/2024.    Allergies as of 03/04/2024 - Review Complete 03/04/2024  Allergen Reaction Noted   Penicillins Hives and Shortness Of Breath 08/05/2012   Lisinopril  Other (See Comments) 12/05/2019   Lovastatin Other (See Comments) 07/11/2013   Other Other (See Comments) 07/11/2013    Past Medical History:  Diagnosis Date   Allergy    THINK I GOT THEM   Anxiety    Arthritis    BODY FULL OF ARTHRITIS   Asthma    Bronchitis     Cancer (HCC)    RIGHT LEG SQUAMOUS CELL   Closed fracture of left distal radius 10/25/2018   COPD (chronic obstructive pulmonary disease) (HCC)    Depression    GERD (gastroesophageal reflux disease)    Headache    Hypercholesteremia    Hypertension    IBS (irritable bowel syndrome)    Pre-diabetes    Sleep apnea    Tobacco abuse    Vitamin D  deficiency     Past Surgical History:  Procedure Laterality Date   APPENDECTOMY     COLONOSCOPY  08/26/2015   Colonic polyps status post polypectomy. Minimal sigmoid diverticulosis.    ESOPHAGOGASTRODUODENOSCOPY  02/25/2011   Large hiatal hernia otherwise normal EGD.   HARDWARE REMOVAL Right 08/16/2022   Procedure: PLATE REMOVAL;  Surgeon: Murrell Drivers, MD;  Location: [facility];  Service: Orthopedics;  Laterality: Right;   I & D EXTREMITY Right 06/17/2022   Procedure: IRRIGATION AND DEBRIDEMENT RIGHT UPPER EXTREMITY;  Surgeon: Murrell Drivers, MD;  Location: [facility];  Service: Orthopedics;  Laterality: Right;   OPEN REDUCTION INTERNAL FIXATION (ORIF) DISTAL RADIAL FRACTURE Left 10/25/2018   Procedure: OPEN REDUCTION INTERNAL FIXATION (ORIF)LEFT  DISTAL RADIAL FRACTURE;  Surgeon: Josefina Chew, MD;  Location: [facility];  Service: Orthopedics;  Laterality: Left;   OPEN REDUCTION INTERNAL FIXATION (ORIF) DISTAL RADIAL FRACTURE Right 07/28/2022   Procedure: OPEN REDUCTION INTERNAL FIXATION (ORIF) RIGHT DISTAL RADIUS FRACTURE CLOSED TREATMENT OF DISTAL ULNA FRACTURE;  Surgeon: Murrell Drivers, MD;  Location: [facility];  Service: Orthopedics;  Laterality: Right;  120 MIN   OPEN REDUCTION INTERNAL FIXATION (ORIF) DISTAL RADIAL FRACTURE Right 08/16/2022   Procedure: OPEN REDUCTION INTERNAL FIXATION (ORIF) RIGHT RADIUS FRACTURE;  Surgeon: Murrell Drivers, MD;  Location: [facility];  Service: Orthopedics;  Laterality: Right;  90 MIN   POLYPECTOMY     VAGINAL HYSTERECTOMY      Family History  Problem  Relation Age of Onset   CAD Mother 79   Alzheimer's disease Father 18   Diabetes Father    Colon cancer Neg Hx    Esophageal cancer Neg Hx    Colon polyps Neg Hx    Rectal cancer Neg Hx    Stomach cancer Neg Hx    Breast cancer Neg Hx     Social History   Socioeconomic History   Marital status: Married    Spouse name: Not on file   Number of children: 2   Years of education: Not on file   Highest education level: Not on file  Occupational History   Not on file  Tobacco Use   Smoking status: Former    Current packs/day: 0.00    Average packs/day: 0.5 packs/day for 44.0 years (22.0 ttl pk-yrs)    Types: Cigarettes  Start date: 09/07/1978    Quit date: 09/07/2022    Years since quitting: 1.4    Passive exposure: Current   Smokeless tobacco: Never   Tobacco comments:    in process of quitting  Vaping Use   Vaping status: Former   Devices: did it for 6 months and stopped   Substance and Sexual Activity   Alcohol use: Not Currently   Drug use: Not Currently    Types: Marijuana    Comment: 2 puffs per day-WHEN SICK, last time a month ago   Sexual activity: Not Currently  Other Topics Concern   Not on file  Social History Narrative   Works at [employer].    Social Drivers of Corporate investment banker Strain: Low Risk  (05/10/2022)   Overall Financial Resource Strain (CARDIA)    Difficulty of Paying Living Expenses: Not hard at all  Food Insecurity: No Food Insecurity (05/10/2022)   Hunger Vital Sign    Worried About Running Out of Food in the Last Year: Never true    Ran Out of Food in the Last Year: Never true  Transportation Needs: No Transportation Needs (05/10/2022)   PRAPARE - Administrator, Civil Service (Medical): No    Lack of Transportation (Non-Medical): No  Physical Activity: Inactive (05/10/2022)   Exercise Vital Sign    Days of Exercise per Week: 0 days    Minutes of Exercise per Session: 0 min  Stress: Stress Concern  Present (05/10/2022)   [employer] of Occupational Health - Occupational Stress Questionnaire    Feeling of Stress : To some extent  Social Connections: Not on file  Intimate Partner Violence: Not on file    Review of Systems  Constitutional:  Negative for fatigue.  HENT: Negative.    Eyes: Negative.   Respiratory:  Positive for apnea, cough and shortness of breath.   Cardiovascular: Negative.   Gastrointestinal: Negative.   Endocrine: Negative.   Genitourinary: Negative.   Neurological:  Negative for weakness.  Psychiatric/Behavioral:  Positive for sleep disturbance.   All other systems reviewed and are negative.   Vitals:   03/04/24 1533  BP: 113/71  Pulse: 72  Temp: 98.4 F (36.9 C)  SpO2: 95%   Physical Exam Constitutional:      Appearance: She is well-developed. She is obese.  HENT:     Head: Normocephalic and atraumatic.     Mouth/Throat:     Mouth: Mucous membranes are moist.  Eyes:     General:        Right eye: No discharge.        Left eye: No discharge.     Conjunctiva/sclera: Conjunctivae normal.     Pupils: Pupils are equal, round, and reactive to light.  Neck:     Thyroid : No thyromegaly.     Trachea: No tracheal deviation.  Cardiovascular:     Rate and Rhythm: Normal rate and regular rhythm.  Pulmonary:     Effort: Pulmonary effort is normal. No respiratory distress.     Breath sounds: No stridor. No wheezing or rhonchi.  Musculoskeletal:     Cervical back: No rigidity or tenderness.  Neurological:     Mental Status: She is alert.  Psychiatric:        Mood and Affect: Mood normal.    Data Reviewed: Report of a previous CT scan noted in record-nodules Most recent CT reviewed showing stable nodules-reviewed by myself  Sleep study results not available  Pulmonary function test reviewed with the patient today showing mild obstruction with significant bronchodilator response  Assessment:  .  Chronic obstructive pulmonary disease -  Unfortunately she is back to smoking - Willing to quit smoking again  Remains compliant with Trelegy  History of mild sleep disordered breathing - Does not want to use CPAP  Old granulomatous disease on CT scan  Plan/Recommendations:  Graded activities as tolerated  Continue Trelegy  Continue albuterol  as needed  Requested pharmacy to assist with smoking cessation  Follow-up in about 6 months  Encouraged to call with any significant concerns    Jennet Epley MD [facility] 03/04/2024, 3:37 PM  CC: Epley Jennet LABOR, MD

## 2024-03-04 NOTE — Patient Instructions (Signed)
 Full PFT performed today.

## 2024-03-05 NOTE — Telephone Encounter (Signed)
 Patient is now scheduled 9/17 w/ Pharmacist Aleck.

## 2024-03-05 NOTE — Telephone Encounter (Signed)
 Attempted to call patient. Left voicemail for patient to call back to get scheduled with pharmacy team.

## 2024-03-06 ENCOUNTER — Other Ambulatory Visit

## 2024-03-06 NOTE — Progress Notes (Deleted)
 Subjective Patient contacted by pulmonary pharmacist for smoking cessation counseling. See telephone note 03/04/24, referred by Dr. Neda. Patient quit for about 14 months, lost spouse recently and started smoking again.   PMH includes depression with anxiety, currently taking bupropion ***?, sertraline , quetiapine , and lorazepam  PRN. Previously seen by psychotherapy in 2023 due to life stressors at that time. *** See Resources message on 10/15/21 from [employer], LCSW with options for [facility] resources.    Social History   Tobacco Use  Smoking Status Former   Current packs/day: 0.00   Average packs/day: 0.5 packs/day for 44.0 years (22.0 ttl pk-yrs)   Types: Cigarettes   Start date: 09/07/1978   Quit date: 09/07/2022   Years since quitting: 1.4   Passive exposure: Current  Smokeless Tobacco Never  Tobacco Comments   in process of quitting     Tobacco Use History Age when started using tobacco on a daily basis ***. Type: cigarettes. Number of cigarettes per day ***, brand ***. Smokes first cigarette *** minutes after waking. {Does/does not:3044014::Does,Does not} wake at night to smoke Triggers include {hx nicotine triggers:311123}.  Quit Attempt History  Most recent quit attempt ***. Longest time ever been tobacco free ***. Methods tried in the past include {CHL AMB [facility] MEDICATIONS FOR SMOKING CESSATION:20759}.  Rates IMPORTANCE of quitting tobacco on 1-10 scale of ***. Rates READINESS of quitting tobacco on 1-10 scale of ***. Rates CONFIDENCE of quitting tobacco on 1-10 scale of ***. Motivators to quitting include ***; barriers include {smoking cessation barriers:18118}    Immunization History  Administered Date(s) Administered   Fluad Quad(high Dose 65+) 05/01/2020, 02/07/2021   INFLUENZA, HIGH DOSE SEASONAL PF 07/12/2018, 03/24/2022   Influenza,inj,Quad PF,6+ Mos 07/11/2013   Influenza-Unspecified 06/28/2023   PFIZER(Purple Top)SARS-COV-2 Vaccination 09/14/2019,  10/08/2019, 05/28/2020, 06/24/2021   Pfizer Covid-19 Vaccine Bivalent Booster 50yrs & up 02/07/2021   Pneumococcal Polysaccharide-23 05/01/2020, 06/24/2021   Tdap 06/20/2008, 10/21/2018, 06/17/2022   Zoster Recombinant(Shingrix ) 06/24/2021     Assessment and Plan  Smoking Cessation   Patient states they are ready to quit smoking.  Patient set quit date of ***.   Discussed smoking cessation agents: Chantix , Wellbutrin , and nicotine replacement therapies.  Patient is agreeable to ***.   Nicotine Patch  Patient counseled on purpose, proper use, and potential adverse effects, including mild itching or redness at the point of application, headache, trouble sleeping, and/or vivid dreams     Patch Schedule for >10 cigarettes daily Weeks 1-6: one 21 mg patch daily Weeks 7-8: one 14 mg patch daily Weeks 9-10: one 7 mg patch daily  Patch Schedule for <10 cigarettes daily Weeks 1 to 6: one nicotine patch (14 mg) daily. I will call and re-assess how you are doing at the end of 6 weeks to see how you are doing on 14 mg patch and if you are ready to decrease dose of patch. Weeks 7-8: one nicotine patch (7 mg) daily  Nicotine Lozenge Patient counseled on purpose, proper use, and potential adverse effects including nausea, hiccups, cough, and heartburn.  Instructed patient to use  2 mg unless the smoke within 30 minutes of waking up in which they should use 4 mg.  Lozenge dosing schedule Weeks 1 to 6: 1 lozenge every 1 to 2 hours (maximum: 5 lozenges every 6 hours; 20 lozenges/day); to increase chances of quitting, use at least 9 lozenges/day during the first 6 weeks Weeks 7 to 9: 1 lozenge every 2 to 4 hours (maximum: 5 lozenges every 6  hours; 20 lozenges/day) Weeks 10 to 12: 1 lozenge every 4 to 8 hours (maximum: 5 lozenges every 6 hours; 20 lozenges/day) Nicotine Gum Patient counseled on purpose, proper use, and potential adverse effects including jaw soreness and upset stomach if swallowing  saliva.  Instructed patient to use 4 mg if they smoke a pack a day or more and 2 mg if they smoke less than a pack a day.  Gum dosing schedule Weeks 1 to 6: Chew 1 piece of gum every 1 to 2 hours (maximum: 24 pieces/day); to increase chances of quitting, chew at least 9 pieces/day during the first 6 weeks Weeks 7 to 9: Chew 1 piece of gum every 2 to 4 hours (maximum: 24 pieces/day) Weeks 10 to 12: Chew 1 piece of gum every 4 to 8 hours (maximum: 24 pieces/day)  Bupropion  Initiated bupropion  ***. Patient with no PMH of seizures. Patient counseled on purpose, proper use, and potential adverse effects, including insomnia, and potential change in mood.   Varenicline  Initiated varenicline  titration of 0.5 mg by mouth once daily with food x3 days, then 0.5 mg by mouth twice daily with food x4 days, then 1 mg by mouth twice daily with food thereafter.  CrCL greater than 30 mL/min. Patient counseled on purpose, proper use, and potential adverse effects, including GI upset, and potential change in mood.   Provided information on 1 800-QUIT NOW support program.   Patient is not ready to quit smoking.  Reviewed STAR quit plan and smoking cessation agents Chantix , Wellbutrin , and nicotine replacement therapy.  Schedule follow up appointment ***.  Set goal to decrease by *** cigarettes every week till next appointment.  Behavioral interventions Patient was counseled on the importance of behavioral interventions to assist in tobacco cessation. *** Highest likelihood of achieving quit when behavioral interventions are COMBINED with pharmacological interventions.   We reviewed basic behavioral interventions, to include:  - Delay - wait 10 minutes and reevaluate urge to smoke,  - Distract - shift thoughts away from smoking,  - Drink water - can help reduce cravings,  - Deep breathing - helps relax you and improves ability to let go of thoughts of smoking,  - Discuss - discuss cravings with someone who  understand them, connect with a support group online or in the community, use an app to help you stay tobacco free  - Recognizing danger situations: Identifying events, internal states, or activities that increase the risk of smoking or relapse. Ex: being around other smokers, drinking alcohol, experiencing urges, being under time pressure/stress. - Developing coping skills: Identifying and practicing coping or problem-solving skills to deal with situations that may pose a risk of relapse. Ex: Learning to anticipate and avoid, temptation, learning cognitive strategies that will reduce negative moods, accomplishing lifestyle changes that reduce stress/improve QOL/produce pleasure, learning cognitive and behavioral skills to cope with smoking urges (e.g. distracting attention). Pt to work on developing 3-4 strategies/activities she/he could employ if cravings occur.  Medicare part B covers up to 8 sessions every 12 months of counseling to help stop smoking or using tobacco. Discussed location options where patient can receive additional counseling support during quit attempt. ***[facility] - accepts Medicare, not Medicaid. Patient may call to schedule for tobacco cessation counseling/psychotherapy support during quit attempt.  ***[facility] (Therapy only)  [address] [place], [place] [address] Phone: [phone] (Accepts Medicaid & Medicare)  Medication Reconciliation A drug regimen assessment was performed, including review of allergies, interactions,  disease-state management, dosing and immunization history. Medications were reviewed with the patient, including name, instructions, indication, goals of therapy, potential side effects, importance of adherence, and safe use.  Immunizations Patient is indicated for influenzae, pneumonia, and shingles vaccinations.   This appointment required *** minutes of patient care (this includes precharting, chart  review, review of results, face-to-face care, etc.).

## 2024-03-07 ENCOUNTER — Telehealth: Payer: Self-pay

## 2024-03-07 NOTE — Telephone Encounter (Signed)
 Copied from CRM [id number]. Topic: Appointments - Scheduling Inquiry for Clinic >> Mar 06, 2024 12:54 PM Lavanda D wrote: Reason for CRM: Patient is sick and would like to cancel her pharmacy appointment & reschedule next week, please call to reschedule when possible. Also note patient has been having phone issues and sometimes unable to answer.    Attempted to call patient 2X, no one answered. I left a voicemail and sent a MyChart message.

## 2024-03-11 ENCOUNTER — Other Ambulatory Visit: Payer: Self-pay | Admitting: Nurse Practitioner

## 2024-03-11 DIAGNOSIS — Z1231 Encounter for screening mammogram for malignant neoplasm of breast: Secondary | ICD-10-CM

## 2024-03-13 ENCOUNTER — Telehealth: Payer: Self-pay

## 2024-03-13 NOTE — Telephone Encounter (Signed)
 Copied from CRM #[id number]. Topic: Appointments - Scheduling Inquiry for Clinic >> Mar 12, 2024  3:00 PM Rozanna G wrote: Reason for CRM: Pt returning call to resch her Pharmacy appt.

## 2024-03-13 NOTE — Telephone Encounter (Signed)
 Called PT no answer left VM.

## 2024-03-14 ENCOUNTER — Encounter: Admitting: Family Medicine

## 2024-03-14 NOTE — Telephone Encounter (Signed)
 2nd attempt called pt no answer left VM

## 2024-03-15 NOTE — Telephone Encounter (Signed)
 Atc x3 No answer left VM

## 2024-03-18 NOTE — Telephone Encounter (Signed)
 Copied from CRM [id number]. Topic: Appointments - Scheduling Inquiry for Clinic >> Mar 15, 2024  3:13 PM Tiffany Murphy wrote: Reason for CRM: Patient is returning a call to [facility] to reschedule her Pharmacy appointment.

## 2024-03-18 NOTE — Telephone Encounter (Signed)
 Attempted to call patient. Left voicemail for patient to call office back to get scheduled with Pharmacy team.

## 2024-03-19 ENCOUNTER — Ambulatory Visit: Payer: Self-pay | Admitting: Pulmonary Disease

## 2024-03-19 ENCOUNTER — Ambulatory Visit
Admission: RE | Admit: 2024-03-19 | Discharge: 2024-03-19 | Disposition: A | Discharge status: Home / Self Care | Source: Ambulatory Visit | Attending: Nurse Practitioner | Admitting: Nurse Practitioner

## 2024-03-19 DIAGNOSIS — Z1231 Encounter for screening mammogram for malignant neoplasm of breast: Secondary | ICD-10-CM

## 2024-03-19 DIAGNOSIS — J441 Chronic obstructive pulmonary disease with (acute) exacerbation: Secondary | ICD-10-CM

## 2024-03-19 NOTE — Telephone Encounter (Signed)
 Patient scheduled.

## 2024-03-25 ENCOUNTER — Other Ambulatory Visit

## 2024-03-25 NOTE — Progress Notes (Deleted)
 Subjective Patient contacted by pulmonary pharmacist for smoking cessation counseling. See telephone note 03/04/24, referred by Dr. Neda. Patient quit for about 14 months, lost spouse recently and started smoking again.   PMH includes depression with anxiety, currently taking bupropion ***, sertraline , quetiapine , and lorazepam  PRN. Previously seen by psychotherapy in 2023 due to life stressors at that time. *** See Resources message on 10/15/21 from [employer], LCSW with options for [facility] resources.   Social History   Tobacco Use  Smoking Status Former   Current packs/day: 0.00   Average packs/day: 0.5 packs/day for 44.0 years (22.0 ttl pk-yrs)   Types: Cigarettes   Start date: 09/07/1978   Quit date: 09/07/2022   Years since quitting: 1.5   Passive exposure: Current  Smokeless Tobacco Never  Tobacco Comments   in process of quitting     Tobacco Use History Age when started using tobacco on a daily basis ***. Type: {Nicotine:3044014::cigarettes,cigar,pipe,electronic cigarettes,snus}. Number of cigarettes per day ***, brand ***. Smokes first cigarette *** minutes after waking. {Does/does not:3044014::Does,Does not} wake at night to smoke Triggers include {hx nicotine triggers:311123}.  Quit Attempt History  Most recent quit attempt ***. Longest time ever been tobacco free ***. Methods tried in the past include {CHL AMB [facility] MEDICATIONS FOR SMOKING CESSATION:20759}.  Rates IMPORTANCE of quitting tobacco on 1-10 scale of ***. Rates READINESS of quitting tobacco on 1-10 scale of ***. Rates CONFIDENCE of quitting tobacco on 1-10 scale of ***. Motivators to quitting include ***; barriers include {smoking cessation barriers:18118}    Immunization History  Administered Date(s) Administered   Fluad Quad(high Dose 65+) 05/01/2020, 02/07/2021   INFLUENZA, HIGH DOSE SEASONAL PF 07/12/2018, 03/24/2022   Influenza,inj,Quad PF,6+ Mos 07/11/2013   Influenza-Unspecified  06/28/2023   PFIZER(Purple Top)SARS-COV-2 Vaccination 09/14/2019, 10/08/2019, 05/28/2020, 06/24/2021   Pfizer Covid-19 Vaccine Bivalent Booster 66yrs & up 02/07/2021   Pneumococcal Polysaccharide-23 05/01/2020, 06/24/2021   Tdap 06/20/2008, 10/21/2018, 06/17/2022   Zoster Recombinant(Shingrix ) 06/24/2021     Assessment and Plan  Smoking Cessation   Patient states they are ready to quit smoking.  Patient set quit date of ***.   Discussed smoking cessation agent Chant, Wellbutrin , and nicotine replacement therapies.  Patient is agreeable to ***. Nicotine Patch  Patient counseled on purpose, proper use, and potential adverse effects, including mild itching or redness at the point of application, headache, trouble sleeping, and/or vivid dreams     Patch Schedule for >10 cigarettes daily Weeks 1-6: one 21 mg patch daily Weeks 7-8: one 14 mg patch daily Weeks 9-10: one 7 mg patch daily  Patch Schedule for <10 cigarettes daily Weeks 1 to 6: one nicotine patch (14 mg) daily. I will call and re-assess how you are doing at the end of 6 weeks to see how you are doing on 14 mg patch and if you are ready to decrease dose of patch. Weeks 7-8: one nicotine patch (7 mg) daily  Nicotine Lozenge Patient counseled on purpose, proper use, and potential adverse effects including nausea, hiccups, cough, and heartburn.  Instructed patient to use  2 mg unless the smoke within 30 minutes of waking up in which they should use 4 mg.  Lozenge dosing schedule Weeks 1 to 6: 1 lozenge every 1 to 2 hours (maximum: 5 lozenges every 6 hours; 20 lozenges/day); to increase chances of quitting, use at least 9 lozenges/day during the first 6 weeks Weeks 7 to 9: 1 lozenge every 2 to 4 hours (maximum: 5 lozenges every 6 hours; 20  lozenges/day) Weeks 10 to 12: 1 lozenge every 4 to 8 hours (maximum: 5 lozenges every 6 hours; 20 lozenges/day) Nicotine Gum Patient counseled on purpose, proper use, and potential adverse  effects including jaw soreness and upset stomach if swallowing saliva.  Instructed patient to use 4 mg if they smoke a pack a day or more and 2 mg if they smoke less than a pack a day.  Gum dosing schedule Weeks 1 to 6: Chew 1 piece of gum every 1 to 2 hours (maximum: 24 pieces/day); to increase chances of quitting, chew at least 9 pieces/day during the first 6 weeks Weeks 7 to 9: Chew 1 piece of gum every 2 to 4 hours (maximum: 24 pieces/day) Weeks 10 to 12: Chew 1 piece of gum every 4 to 8 hours (maximum: 24 pieces/day)   Provided information on 1 800-QUIT NOW support program.   Medication Reconciliation A drug regimen assessment was performed, including review of allergies, interactions, disease-state management, dosing and immunization history. Medications were reviewed with the patient, including name, instructions, indication, goals of therapy, potential side effects, importance of adherence, and safe use.  Immunizations Patient is indicated for influenzae, pneumonia, and shingles vaccinations.   This appointment required *** minutes of patient care (this includes precharting, chart review, review of results, face-to-face care, etc.).

## 2024-03-27 ENCOUNTER — Telehealth: Payer: Self-pay | Admitting: Acute Care

## 2024-03-27 NOTE — Telephone Encounter (Signed)
 Returned call to patient regarding an inquiry into lung cancer screening.  No answer. Left VM and direct extension to call back

## 2024-04-02 ENCOUNTER — Other Ambulatory Visit: Payer: Self-pay | Admitting: Pulmonary Disease

## 2024-04-02 ENCOUNTER — Telehealth: Payer: Self-pay

## 2024-04-02 ENCOUNTER — Other Ambulatory Visit

## 2024-04-02 ENCOUNTER — Encounter: Payer: Self-pay | Admitting: Pulmonary Disease

## 2024-04-02 NOTE — Telephone Encounter (Signed)
 I attempted to call patient and LVM - see phone note Appointment (No-show tobacco cessation). I left a contact number where she can return call to reschedule.   For rescheduling purposes, this can be routed to scheduling team.   Aleck Puls, PharmD, BCPS, CPP Clinical Pharmacist  [facility]

## 2024-04-02 NOTE — Telephone Encounter (Signed)
 Copied from CRM [id number]. Topic: Appointments - Scheduling Inquiry for Clinic >> Apr 02, 2024  9:53 AM Rozanna MATSU wrote: Reason for CRM: Pt has an appt with Pharm and she stated she is not feeling well and wnts to cancel/resch. Thanks  Routing to our Pharmacist so she is aware.

## 2024-04-02 NOTE — Telephone Encounter (Signed)
 Initial tobacco cessation visit 03/06/24 was cancelled (Patient). Rescheduled 03/25/24, (no show). Rescheduled today, 04/02/24 (no-show).   ATC patient to offer phone or video appt. LVMTCB.   Aleck Puls, PharmD, BCPS, CPP Clinical Pharmacist  [facility]

## 2024-04-02 NOTE — Progress Notes (Signed)
 Nebulizer medications should be with albuterol 

## 2024-04-02 NOTE — Progress Notes (Deleted)
 Subjective Patient contacted by pulmonary pharmacist for smoking cessation counseling. See telephone note 03/04/24, referred by Dr. Neda. Patient quit for about 14 months, lost spouse recently and started smoking again.   PMH includes depression with anxiety, currently taking bupropion ***, sertraline , quetiapine , and lorazepam  PRN. Previously seen by psychotherapy in 2023 due to life stressors at that time. *** See Resources message on 10/15/21 from [employer], LCSW with options for [facility] resources.   Prior tobacco cessation medication fills:  - Chantix  (Jan 2021 - ***) -   Social History   Tobacco Use  Smoking Status Former   Current packs/day: 0.00   Average packs/day: 0.5 packs/day for 44.0 years (22.0 ttl pk-yrs)   Types: Cigarettes   Start date: 09/07/1978   Quit date: 09/07/2022   Years since quitting: 1.5   Passive exposure: Current  Smokeless Tobacco Never  Tobacco Comments   in process of quitting     Tobacco Use History Age when started using tobacco on a daily basis ***. Type: {Nicotine:3044014::cigarettes,cigar,pipe,electronic cigarettes,snus}. Number of cigarettes per day ***, brand ***. Smokes first cigarette *** minutes after waking. {Does/does not:3044014::Does,Does not} wake at night to smoke Triggers include {hx nicotine triggers:311123}.  Quit Attempt History  Most recent quit attempt ***. Longest time ever been tobacco free ***. Methods tried in the past include Varenicline  (Chantix ), ***.  Rates IMPORTANCE of quitting tobacco on 1-10 scale of ***. Rates READINESS of quitting tobacco on 1-10 scale of ***. Rates CONFIDENCE of quitting tobacco on 1-10 scale of ***. Motivators to quitting include ***; barriers include {smoking cessation barriers:18118}    Immunization History  Administered Date(s) Administered   Fluad Quad(high Dose 65+) 05/01/2020, 02/07/2021   INFLUENZA, HIGH DOSE SEASONAL PF 07/12/2018, 03/24/2022    Influenza,inj,Quad PF,6+ Mos 07/11/2013   Influenza-Unspecified 06/28/2023   PFIZER(Purple Top)SARS-COV-2 Vaccination 09/14/2019, 10/08/2019, 05/28/2020, 06/24/2021   Pfizer Covid-19 Vaccine Bivalent Booster 5yrs & up 02/07/2021   Pneumococcal Polysaccharide-23 05/01/2020, 06/24/2021   Tdap 06/20/2008, 10/21/2018, 06/17/2022   Zoster Recombinant(Shingrix ) 06/24/2021     Assessment and Plan  Smoking Cessation   Patient states they are ready to quit smoking.  Patient set quit date of ***.   Discussed smoking cessation agent Chant, Wellbutrin , and nicotine replacement therapies.  Patient is agreeable to ***.   Nicotine Patch  Patient counseled on purpose, proper use, and potential adverse effects, including mild itching or redness at the point of application, headache, trouble sleeping, and/or vivid dreams     Patch Schedule for >10 cigarettes daily Weeks 1-6: one 21 mg patch daily Weeks 7-8: one 14 mg patch daily Weeks 9-10: one 7 mg patch daily  Patch Schedule for <10 cigarettes daily Weeks 1 to 6: one nicotine patch (14 mg) daily. I will call and re-assess how you are doing at the end of 6 weeks to see how you are doing on 14 mg patch and if you are ready to decrease dose of patch. Weeks 7-8: one nicotine patch (7 mg) daily  Nicotine Lozenge Patient counseled on purpose, proper use, and potential adverse effects including nausea, hiccups, cough, and heartburn.  Instructed patient to use  2 mg unless the smoke within 30 minutes of waking up in which they should use 4 mg.  Lozenge dosing schedule Weeks 1 to 6: 1 lozenge every 1 to 2 hours (maximum: 5 lozenges every 6 hours; 20 lozenges/day); to increase chances of quitting, use at least 9 lozenges/day during the first 6 weeks Weeks 7 to 9: 1  lozenge every 2 to 4 hours (maximum: 5 lozenges every 6 hours; 20 lozenges/day) Weeks 10 to 12: 1 lozenge every 4 to 8 hours (maximum: 5 lozenges every 6 hours; 20 lozenges/day) Nicotine  Gum Patient counseled on purpose, proper use, and potential adverse effects including jaw soreness and upset stomach if swallowing saliva.  Instructed patient to use 4 mg if they smoke a pack a day or more and 2 mg if they smoke less than a pack a day.  Gum dosing schedule Weeks 1 to 6: Chew 1 piece of gum every 1 to 2 hours (maximum: 24 pieces/day); to increase chances of quitting, chew at least 9 pieces/day during the first 6 weeks Weeks 7 to 9: Chew 1 piece of gum every 2 to 4 hours (maximum: 24 pieces/day) Weeks 10 to 12: Chew 1 piece of gum every 4 to 8 hours (maximum: 24 pieces/day)   Provided information on 1 800-QUIT NOW support program.   Medication Reconciliation A drug regimen assessment was performed, including review of allergies, interactions, disease-state management, dosing and immunization history. Medications were reviewed with the patient, including name, instructions, indication, goals of therapy, potential side effects, importance of adherence, and safe use.  Immunizations Patient is indicated for influenzae, pneumonia, and shingles vaccinations.   This appointment required *** minutes of patient care (this includes precharting, chart review, review of results, face-to-face care, etc.).

## 2024-04-03 NOTE — Telephone Encounter (Signed)
 ATC 1x Left VM Sent LTR

## 2024-04-03 NOTE — Telephone Encounter (Signed)
 Patient is scheduled 10/16 virtually w/ Pharmacist Aleck. NFN.

## 2024-04-04 ENCOUNTER — Ambulatory Visit

## 2024-04-04 DIAGNOSIS — Z720 Tobacco use: Secondary | ICD-10-CM

## 2024-04-04 NOTE — Progress Notes (Signed)
 Subjective Patient is contacted by [facility] pharmacy team for smoking cessation counseling at the request of her pulmonologist, Dr. Neda. Patient quit smoking for about 14 months, then lost spouse in May 2025 and restarted smoking.   PMH includes depression with anxiety, currently taking bupropion  in combination with other antidepressants. Previously engaged in psychotherapy in 2023. Her primary care provider is at [facility] Oscar Lesches, NP).   Today, patient reports she is on day 5 of being tobacco free. She quit on 03/31/24 due to feeling unwell the past few days. Finds cigarettes nasty. Endorses urges. Looking for support to sustain quit.   Social History   Tobacco Use  Smoking Status Former   Current packs/day: 0.00   Average packs/day: 0.5 packs/day for 44.0 years (22.0 ttl pk-yrs)   Types: Cigarettes   Start date: 09/07/1978   Quit date: 09/07/2022   Years since quitting: 1.5   Passive exposure: Current  Smokeless Tobacco Never  Tobacco Comments   in process of quitting     Tobacco Use History Type: cigarettes. Number of cigarettes per day: 12 Smokes first cigarette within 30 minutes after waking. Triggers include emotional: (stress), environmental (daughter smokes around her), habitual (with cup of coffee, after eating).  Quit Attempt History  Most recent quit attempt lasted 14 months Methods tried in the past include:  Varenicline  (Chantix ) - recalls side effects limited ongoing use, but does not recall specific side effect Nicotine lozenges - reports these were effective Nicotine patch - endorses difficulty keeping the patch on (not adhesive enough) Zyban  - currently taking bupropion  for mental health Rates CONFIDENCE of quitting tobacco on 1-10 scale: 7. Motivators to quitting include better health (feeling better), finds cigarettes nasty; barriers include under a lot of stress now and daughter (who lives in  the household) smokes around her, feels she has limited support system    Immunization History  Administered Date(s) Administered   Fluad Quad(high Dose 65+) 05/01/2020, 02/07/2021   INFLUENZA, HIGH DOSE SEASONAL PF 07/12/2018, 03/24/2022   Influenza,inj,Quad PF,6+ Mos 07/11/2013   Influenza-Unspecified 06/28/2023   PFIZER(Purple Top)SARS-COV-2 Vaccination 09/14/2019, 10/08/2019, 05/28/2020, 06/24/2021   Pfizer Covid-19 Vaccine Bivalent Booster 72yrs & up 02/07/2021   Pneumococcal Polysaccharide-23 05/01/2020, 06/24/2021   Tdap 06/20/2008, 10/21/2018, 06/17/2022   Zoster Recombinant(Shingrix ) 06/24/2021     Assessment and Plan  Smoking Cessation   Patient states she quit smoking. Today is day 5 of tobacco free. She is looking for support to sustain quit attempt.   Discussed smoking cessation agent Chant, Wellbutrin , and nicotine replacement therapies. Patient is agreeable to start nicotine replacement therapies.  Nicotine Patch  Patient counseled on purpose, proper use, and potential adverse effects, including mild itching or redness at the point of application, headache, trouble sleeping, and/or vivid dreams     Usual Patch Schedule for >10 cigarettes daily - patient desires to trial 14mg  patch first instead of 21mg  patch Weeks 1-6: one 21 mg patch daily Weeks 7-8: one 14 mg patch daily Weeks 9-10: one 7 mg patch daily  Nicotine Lozenge Patient counseled on purpose, proper use, and potential adverse effects including nausea, hiccups, cough, and heartburn.  Instructed patient to use 4 mg lozenge due to first cigarette is within 30 minutes of waking up.  Medication Reconciliation A drug regimen assessment was performed, including review of allergies, interactions, disease-state management, dosing and immunization history. Medications were reviewed with the patient, including name, instructions, indication, goals of therapy, potential side effects,  importance of adherence, and safe  use.  Of note, patient brought forward the following concerns during this visit -  - She has had 6 falls in the last month. Denies LOC. Unable to specify details of moments leading up to falls, such as if she was dizzy or tripped. Falls happen in various places (yard, house, etc.). She has not gone to Urgent Care or ER, nor has she reported this to PCP.  - Chest pain off and on in the last month, more common in the last week. No chest pain currently. Describes as sharp pain right around her heart rated level 5/10 pain.  - Generally feeling sick or unwell the last week or so, but has a hard time describing symptoms.   This PharmD called PCP office [phone]) to report the above concerns. Staff member reported patient last seen at their office in August 2025. No upcoming visits scheduled. Provider will reach out to patient and get appointment scheduled. This PharmD also encouraged patient to call her PCP and report her concerns.   PLAN: - Patient to contact PCP regarding recent frequent falls, transient chest pain, and feeling unwell.  - PharmD contact PCP office after this visit via phone to discuss above. - After more urgent symptoms are addressed with PCP, tentatively plan to start nicotine replacement therapy as follows: nicotine 14mg  patch once daily (remove at night if sleep interruptions occur) + nicotine 4mg  lozenge q2h PRN cravings/urges - Will hold off on starting nicotine replacement therapy until other symptoms described above can be addressed with PCP.  - PharmD to contact patient in 1 week to check in with patient.   Patient verbalizes understanding and agreement with plan.   This appointment required 60 minutes of patient care (this includes precharting, chart review, review of results, face-to-face care, etc.).  Aleck Puls, PharmD, BCPS, CPP Clinical Pharmacist  [facility]

## 2024-04-19 NOTE — Progress Notes (Signed)
 ATC patient to follow-up after she saw her primary care provider, unable to view outside records - LVMTCB

## 2024-04-22 ENCOUNTER — Encounter: Payer: Self-pay | Admitting: Radiology

## 2024-04-25 MED ORDER — NICOTINE POLACRILEX 4 MG MT LOZG
4.0000 mg | LOZENGE | OROMUCOSAL | 0 refills | Status: AC | PRN
Start: 2024-04-25 — End: ?

## 2024-04-25 MED ORDER — NICOTINE 14 MG/24HR TD PT24: 14.0000 mg | MEDICATED_PATCH | Freq: Every day | TRANSDERMAL | 0 refills | Status: AC

## 2024-04-25 NOTE — Addendum Note (Signed)
 Addended by: Enyah Moman L on: 04/25/2024 09:39 AM   Modules accepted: Orders

## 2024-04-25 NOTE — Progress Notes (Signed)
 Spoke to patient - she reports she was able to discuss her frequent falls and chest pain with PCP. She reports the chest pain started after falling on her sternum. It has improved. Her PCP ordered a chest x-ray that patient is calling to get scheduled. Patient denies ongoing cardiovascular work-up.   Rx for nicotine patch + lozenge placed. Also encouraged to call [facility]. [phone].   Educated that if she has chest pain or palpitations after starting NRT, remove patch. Gave ER precautions for s/sx of heart attack.   PharmD will call to check in on tobacco cessation progress in 4 weeks.

## 2024-04-26 ENCOUNTER — Other Ambulatory Visit: Payer: Self-pay | Admitting: Pulmonary Disease

## 2024-04-26 MED ORDER — IPRATROPIUM-ALBUTEROL 0.5-2.5 (3) MG/3ML IN SOLN: 3.0000 mL | RESPIRATORY_TRACT | 1 refills | Status: AC | PRN

## 2024-05-01 ENCOUNTER — Telehealth: Payer: Self-pay | Admitting: *Deleted

## 2024-05-01 NOTE — Telephone Encounter (Signed)
 Fax received from [facility] for prescription for Albuterol /Ipratropium neb solution.  Script completed/signed by Dr. Neda and faxed to [phone] on 04/16/2024.  Received confirmation fax that it was sent successfully.  Sent to scan.

## 2024-05-07 ENCOUNTER — Ambulatory Visit (INDEPENDENT_AMBULATORY_CARE_PROVIDER_SITE_OTHER)

## 2024-05-07 ENCOUNTER — Encounter: Payer: Self-pay | Admitting: Emergency Medicine

## 2024-05-07 ENCOUNTER — Ambulatory Visit
Admission: EM | Admit: 2024-05-07 | Discharge: 2024-05-07 | Disposition: A | Discharge status: Home / Self Care | Attending: Family Medicine | Admitting: Family Medicine

## 2024-05-07 DIAGNOSIS — R0602 Shortness of breath: Secondary | ICD-10-CM | POA: Diagnosis not present

## 2024-05-07 DIAGNOSIS — J209 Acute bronchitis, unspecified: Secondary | ICD-10-CM

## 2024-05-07 DIAGNOSIS — R051 Acute cough: Secondary | ICD-10-CM | POA: Diagnosis not present

## 2024-05-07 DIAGNOSIS — H66001 Acute suppurative otitis media without spontaneous rupture of ear drum, right ear: Secondary | ICD-10-CM

## 2024-05-07 MED ORDER — AZITHROMYCIN 250 MG PO TABS: ORAL_TABLET | ORAL | 0 refills | Status: DC

## 2024-05-07 MED ORDER — BENZONATATE 100 MG PO CAPS: 100.0000 mg | ORAL_CAPSULE | Freq: Three times a day (TID) | ORAL | 0 refills | Status: DC

## 2024-05-07 MED ORDER — IPRATROPIUM-ALBUTEROL 0.5-2.5 (3) MG/3ML IN SOLN
3.0000 mL | Freq: Once | RESPIRATORY_TRACT | Status: AC
  Administered 2024-05-07: 3 mL via RESPIRATORY_TRACT

## 2024-05-07 MED ORDER — METHYLPREDNISOLONE SODIUM SUCC 125 MG IJ SOLR
80.0000 mg | Freq: Once | INTRAMUSCULAR | Status: AC
  Administered 2024-05-07: 80 mg via INTRAMUSCULAR

## 2024-05-07 NOTE — Discharge Instructions (Addendum)
 EKG done today.  This shows normal sinus rhythm.  Chest x-ray done today.  Final evaluation by the radiologist does not show pneumonia or other acute process.  Symptoms and physical exam findings are most consistent with bronchitis with upper respiratory infection and right otitis media.  We have treated with the following: DuoNeb breathing treatment done today in clinic due to significant wheezing and decreased breath sounds in the bases bilateral Medrol  injection given today. This is a steroid to help with airway inflammation Azithromycin  250mg  Take 2 tablets today and the 1 tablet daily for 4 more days.  Benzonatate (tessalon) 100 mg every 8 hours as needed for cough.   Continue albuterol  nebulizing treatments and albuterol  inhaler as needed for shortness of breath and wheezing. Make sure to stay hydrated by drinking plenty of water. Return to urgent care or PCP if symptoms worsen or fail to resolve.

## 2024-05-07 NOTE — ED Triage Notes (Signed)
 Pt c/o productive cough (white/gray) for approx 12 days  Also c/o upper chest pain x's 12 days  Pain increases with coughing

## 2024-05-07 NOTE — ED Provider Notes (Signed)
 [facility]    CSN: [id number] Arrival date & time: 05/07/24  1722      History   Chief Complaint Chief Complaint  Patient presents with   Chest Pain   Cough    HPI Tiffany Murphy is a 73 y.o. female.   73 year old female who presents urgent care with complaints of cough, shortness of breath, fatigue and chest pain.  She reports that this started about 12 days ago.  She has had a productive cough with gray-whitish thick phlegm.  She has noted wheezing.  She does have a nebulizer at home and has been using it but did not use it today.  She is also having chest pain associated with this from coughing.  She denies having any fevers or chills.  She denies nausea, vomiting, abdominal pain, dysuria, hematuria.  She is able to eat and drink although somewhat less than usual.  She is having ear pain bilateral as well   Chest Pain Associated symptoms: cough, fatigue and shortness of breath   Associated symptoms: no abdominal pain, no back pain, no fever, no palpitations and no vomiting   Cough Associated symptoms: chest pain and shortness of breath   Associated symptoms: no chills, no ear pain, no fever, no rash and no sore throat     Past Medical History:  Diagnosis Date   Allergy    THINK I GOT THEM   Anxiety    Arthritis    BODY FULL OF ARTHRITIS   Asthma    Bronchitis    Cancer (HCC)    RIGHT LEG SQUAMOUS CELL   Closed fracture of left distal radius 10/25/2018   COPD (chronic obstructive pulmonary disease) (HCC)    Depression    GERD (gastroesophageal reflux disease)    Headache    Hypercholesteremia    Hypertension    IBS (irritable bowel syndrome)    Pre-diabetes    Sleep apnea    Tobacco abuse    Vitamin D  deficiency     Patient Active Problem List   Diagnosis Date Noted   Prediabetes 08/03/2022   Fracture of radial shaft, with ulna, right, open 06/17/2022   Open wound of right forearm due to dog bite 06/17/2022   Fracture of radial shaft,  with ulna, left, open 06/17/2022   Skin lesion of chest wall 03/24/2022   Need for influenza vaccination 03/24/2022   Stress at home 12/22/2021   Slow transit constipation 12/22/2021   Colitis 03/17/2021   Sepsis (HCC) 03/17/2021   AKI (acute kidney injury) 03/17/2021   Lumbar radiculopathy 12/17/2020   Degenerative spondylolisthesis 11/13/2020   Pain of lumbar spine 11/13/2020   Subjective memory complaints 02/21/2019   Frequency of urination- pt thinks due to HCTZ BP med 02/21/2019   Increased frequency of urination 02/21/2019   Mixed hyperlipidemia 11/13/2018   Hyperlipidemia 11/13/2018   Closed fracture of left distal radius 10/25/2018   Closed fracture of distal end of left radius 10/25/2018   Sleep difficulties- poor sleep hygeine 07/12/2018   Chronic pain disorder 07/12/2018   Chronic joint pain 07/12/2018   Chronic pain syndrome 07/12/2018   Arthralgia 07/12/2018   Difficulty sleeping 07/12/2018   OSA (obstructive sleep apnea) 04/30/2018   Chronic fatigue 04/30/2018   Lung nodule seen on imaging study 04/30/2018   Vitamin D  deficiency 04/30/2018   Fatigue 04/30/2018   Lung nodule 04/30/2018   Obstructive sleep apnea syndrome 04/30/2018   Elevated LDL cholesterol level 04/11/2018   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  GERD without esophagitis 04/11/2018   Obesity, Class I, BMI 30-34.9 04/11/2018   Glucose intolerance (impaired glucose tolerance) 04/11/2018   Irritable bowel syndrome 04/11/2018   Chronic bilateral upper abdominal pain 04/11/2018   Panic attacks 04/11/2018   Mood disorder 04/11/2018   Hypokalemia 04/11/2018   Environmental and seasonal allergies 04/11/2018   Chronic abdominal pain 04/11/2018   Asthma 04/11/2018   Chronic obstructive pulmonary disease (HCC) 04/11/2018   Elevated low density lipoprotein (LDL) cholesterol level 04/11/2018   Predisposition to allergic reaction 04/11/2018   Impaired glucose tolerance 04/11/2018   Gastroesophageal reflux  disease 04/11/2018   Class 1 obesity 04/11/2018   Panic attack 04/11/2018   Tobacco user 04/11/2018   Elevated coronary artery calcium  score 12/01/2014   Calcification of coronary artery 12/01/2014   Essential hypertension 07/11/2013   Depression with anxiety 07/11/2013   Pure hypercholesterolemia 07/11/2013   Mixed anxiety and depressive disorder 07/11/2013   Benign essential hypertension 07/11/2013   Overweight 06/15/2013   Nicotine dependence 06/15/2013    Past Surgical History:  Procedure Laterality Date   APPENDECTOMY     COLONOSCOPY  08/26/2015   Colonic polyps status post polypectomy. Minimal sigmoid diverticulosis.    ESOPHAGOGASTRODUODENOSCOPY  02/25/2011   Large hiatal hernia otherwise normal EGD.   HARDWARE REMOVAL Right 08/16/2022   Procedure: PLATE REMOVAL;  Surgeon: Murrell Drivers, MD;  Location: [facility];  Service: Orthopedics;  Laterality: Right;   I & D EXTREMITY Right 06/17/2022   Procedure: IRRIGATION AND DEBRIDEMENT RIGHT UPPER EXTREMITY;  Surgeon: Murrell Drivers, MD;  Location: [facility];  Service: Orthopedics;  Laterality: Right;   OPEN REDUCTION INTERNAL FIXATION (ORIF) DISTAL RADIAL FRACTURE Left 10/25/2018   Procedure: OPEN REDUCTION INTERNAL FIXATION (ORIF)LEFT  DISTAL RADIAL FRACTURE;  Surgeon: Josefina Chew, MD;  Location: [facility];  Service: Orthopedics;  Laterality: Left;   OPEN REDUCTION INTERNAL FIXATION (ORIF) DISTAL RADIAL FRACTURE Right 07/28/2022   Procedure: OPEN REDUCTION INTERNAL FIXATION (ORIF) RIGHT DISTAL RADIUS FRACTURE CLOSED TREATMENT OF DISTAL ULNA FRACTURE;  Surgeon: Murrell Drivers, MD;  Location: [facility];  Service: Orthopedics;  Laterality: Right;  120 MIN   OPEN REDUCTION INTERNAL FIXATION (ORIF) DISTAL RADIAL FRACTURE Right 08/16/2022   Procedure: OPEN REDUCTION INTERNAL FIXATION (ORIF) RIGHT RADIUS FRACTURE;  Surgeon: Murrell Drivers, MD;  Location: [facility];  Service: Orthopedics;   Laterality: Right;  90 MIN   POLYPECTOMY     VAGINAL HYSTERECTOMY      OB History   No obstetric history on file.      Home Medications    Prior to Admission medications   Medication Sig Start Date End Date Taking? Authorizing Provider  azithromycin  (ZITHROMAX ) 250 MG tablet Take first 2 tablets together, then 1 every day until finished. 05/07/24  Yes Chiann Goffredo A, PA-C  benzonatate (TESSALON) 100 MG capsule Take 1 capsule (100 mg total) by mouth every 8 (eight) hours. 05/07/24  Yes Dannika Hilgeman A, PA-C  ipratropium-albuterol  (DUONEB) 0.5-2.5 (3) MG/3ML SOLN Take 3 mLs by nebulization every 4 (four) hours as needed. 04/26/24   Olalere, Jennet LABOR, MD  acyclovir  (ZOVIRAX ) 200 MG capsule Take 200 mg by mouth daily as needed (for break out). 08/17/21   [provider]  albuterol  (PROVENTIL ) (2.5 MG/3ML) 0.083% nebulizer solution Take 3 mLs (2.5 mg total) by nebulization every 6 (six) hours as needed for wheezing or shortness of breath. 12/09/22   Berneta Elsie Sayre, MD  albuterol  (VENTOLIN  HFA) 108 [phone] Base) MCG/ACT  inhaler Inhale 2 puffs into the lungs every 6 (six) hours as needed for wheezing. 10/15/21   Tanda Bleacher, MD  amLODipine  (NORVASC ) 5 MG tablet TAKE 1 TABLET EVERY DAY 03/21/23   Berneta Elsie Sayre, MD  buPROPion  (WELLBUTRIN  XL) 300 MG 24 hr tablet Take 1 tablet (300 mg total) by mouth daily. 06/08/23   Berneta Elsie Sayre, MD  buPROPion  (ZYBAN ) 150 MG 12 hr tablet Take 150 mg by mouth daily.    [provider]  Cyanocobalamin (VITAMIN B 12 PO) Take 1 tablet by mouth daily.    [provider]  fluticasone  (FLONASE ) 50 MCG/ACT nasal spray Place 2 sprays into each nostril once a day 12/09/22   Berneta Elsie Sayre, MD  Fluticasone -Umeclidin-Vilant (TRELEGY ELLIPTA ) 100-62.5-25 MCG/ACT AEPB Inhale 1 puff into the lungs daily. 07/05/23   Olalere, Jennet LABOR, MD  gabapentin  (NEURONTIN ) 300 MG capsule TAKE 1 CAPSULE BY MOUTH IN THE MORNING AND  1 IN THE EVENING AND 2 AT BEDTIME 12/30/22   Webb, Padonda B, FNP  hydrocortisone  (ANUSOL -HC) 2.5 % rectal cream Insert and apply rectally 2 times a day for 10 days Patient taking differently: Place 1 Application rectally daily as needed for hemorrhoids. 08/31/21     LORazepam  (ATIVAN ) 0.5 MG tablet Take 0.5 mg by mouth every 6 (six) hours as needed. Patient not taking: Reported on 05/07/2024    [provider]  losartan  (COZAAR ) 100 MG tablet Take 1 tablet by mouth once daily 06/02/22   Berneta Elsie Sayre, MD  metFORMIN  (GLUCOPHAGE -XR) 500 MG 24 hr tablet TAKE 1 TABLET EVERY DAY WITH BREAKFAST 03/28/23   Berneta Elsie Sayre, MD  montelukast  (SINGULAIR ) 10 MG tablet Take 1 tablet (10 mg total) by mouth at bedtime. 04/04/22   Berneta Elsie Sayre, MD  Multiple Vitamin (MULTIVITAMIN WITH MINERALS) TABS tablet Take 1 tablet by mouth daily.    [provider]  nicotine (NICODERM CQ - DOSED IN MG/24 HOURS) 14 mg/24hr patch Place 1 patch (14 mg total) onto the skin daily. 04/25/24   Neda Jennet LABOR, MD  nicotine polacrilex (COMMIT) 4 MG lozenge Take 1 lozenge (4 mg total) by mouth every 2 (two) hours as needed for smoking cessation. Patient not taking: Reported on 05/07/2024 04/25/24   Neda Jennet LABOR, MD  Omega-3 Fatty Acids (FISH OIL) 1000 MG CPDR Take 1,000 mg by mouth daily.    [provider]  omeprazole  (PRILOSEC) 40 MG capsule TAKE 1 CAPSULE EVERY DAY 04/06/23   Berneta Elsie Sayre, MD  oxyCODONE  (ROXICODONE ) 5 MG immediate release tablet Take 1 tablet (5 mg total) by mouth every 4 (four) hours as needed for up to 5 doses for severe pain (pain score 7-10). Patient not taking: Reported on 02/02/2024 08/16/23   Meredith, Savannah F, PA-C  polyethylene glycol powder (GLYCOLAX /MIRALAX ) 17 GM/SCOOP powder Dissolve 1 scoop of medication in water daily as needed for constipation. 12/22/21   Berneta Elsie Sayre, MD  QUEtiapine  (SEROQUEL ) 50 MG tablet Take 1 tablet (50  mg total) by mouth at bedtime. 06/08/23   Berneta Elsie Sayre, MD  rosuvastatin  (CRESTOR ) 40 MG tablet TAKE 1/2 TABLET AT BEDTIME Patient not taking: Reported on 05/07/2024 06/08/23   Berneta Elsie Sayre, MD  sertraline  (ZOLOFT ) 100 MG tablet Take 2 tablets by mouth once daily 11/10/23   Berneta Elsie Sayre, MD    Family History Family History  Problem Relation Age of Onset   CAD Mother 26   Alzheimer's disease Father 62   Diabetes Father  Colon cancer Neg Hx    Esophageal cancer Neg Hx    Colon polyps Neg Hx    Rectal cancer Neg Hx    Stomach cancer Neg Hx    Breast cancer Neg Hx     Social History Social History   Tobacco Use   Smoking status: Former    Current packs/day: 0.00    Average packs/day: 0.5 packs/day for 44.0 years (22.0 ttl pk-yrs)    Types: Cigarettes    Start date: 09/07/1978    Quit date: 09/07/2022    Years since quitting: 1.6    Passive exposure: Current   Smokeless tobacco: Never   Tobacco comments:    in process of quitting  Vaping Use   Vaping status: Former   Devices: did it for 6 months and stopped   Substance Use Topics   Alcohol use: Not Currently   Drug use: Not Currently    Types: Marijuana    Comment: 2 puffs per day-WHEN SICK, last time a month ago     Allergies   Penicillins, Lisinopril , Lovastatin, and Other   Review of Systems Review of Systems  Constitutional:  Positive for fatigue. Negative for chills and fever.  HENT:  Positive for congestion. Negative for ear pain and sore throat.   Eyes:  Negative for pain and visual disturbance.  Respiratory:  Positive for cough and shortness of breath.   Cardiovascular:  Positive for chest pain. Negative for palpitations.  Gastrointestinal:  Negative for abdominal pain and vomiting.  Genitourinary:  Negative for dysuria and hematuria.  Musculoskeletal:  Negative for arthralgias and back pain.  Skin:  Negative for color change and rash.  Neurological:  Negative for seizures  and syncope.  All other systems reviewed and are negative.    Physical Exam Triage Vital Signs ED Triage Vitals  Encounter Vitals Group     BP 05/07/24 1746 119/75     Girls Systolic BP Percentile --      Girls Diastolic BP Percentile --      Boys Systolic BP Percentile --      Boys Diastolic BP Percentile --      Pulse Rate 05/07/24 1746 69     Resp 05/07/24 1746 18     Temp 05/07/24 1746 98.2 F (36.8 C)     Temp Source 05/07/24 1746 Oral     SpO2 05/07/24 1746 93 %     Weight 05/07/24 1748 160 lb (72.6 kg)     Height 05/07/24 1748 5' 2 (1.575 m)     Head Circumference --      Peak Flow --      Pain Score 05/07/24 1748 0     Pain Loc --      Pain Education --      Exclude from Growth Chart --    No data found.  Updated Vital Signs BP 119/75 (BP Location: Left Arm)   Pulse 69   Temp 98.2 F (36.8 C) (Oral)   Resp 18   Ht 5' 2 (1.575 m)   Wt 160 lb (72.6 kg)   SpO2 93%   BMI 29.26 kg/m   Visual Acuity Right Eye Distance:   Left Eye Distance:   Bilateral Distance:    Right Eye Near:   Left Eye Near:    Bilateral Near:     Physical Exam Vitals and nursing note reviewed.  Constitutional:      General: She is not in acute distress.    Appearance: She  is well-developed.  HENT:     Head: Normocephalic and atraumatic.     Right Ear: A middle ear effusion is present. Tympanic membrane is erythematous.     Left Ear: A middle ear effusion is present. Tympanic membrane is not erythematous.  Eyes:     Conjunctiva/sclera: Conjunctivae normal.  Cardiovascular:     Rate and Rhythm: Normal rate and regular rhythm.     Heart sounds: No murmur heard. Pulmonary:     Effort: Pulmonary effort is normal. No tachypnea or respiratory distress.     Breath sounds: Examination of the right-upper field reveals wheezing and rhonchi. Examination of the left-upper field reveals wheezing and rhonchi. Examination of the right-middle field reveals wheezing and rhonchi. Examination  of the left-middle field reveals wheezing and rhonchi. Examination of the right-lower field reveals decreased breath sounds. Examination of the left-lower field reveals decreased breath sounds. Decreased breath sounds, wheezing and rhonchi present.  Abdominal:     Palpations: Abdomen is soft.     Tenderness: There is no abdominal tenderness.  Musculoskeletal:        General: No swelling.     Cervical back: Neck supple.  Skin:    General: Skin is warm and dry.     Capillary Refill: Capillary refill takes less than 2 seconds.  Neurological:     Mental Status: She is alert.  Psychiatric:        Mood and Affect: Mood normal.      UC Treatments / Results  Labs (all labs ordered are listed, but only abnormal results are displayed) Labs Reviewed - No data to display  EKG   Radiology DG Chest 2 View Result Date: 05/07/2024 EXAM: 2 VIEW(S) XRAY OF THE CHEST 05/07/2024 06:22:03 PM COMPARISON: Chest x-ray 09/04/2022, CT chest 07/19/2018. CLINICAL HISTORY: shortness of breath, cough, chest pain FINDINGS: LUNGS AND PLEURA: Calcified right lower lobe granuloma. Redemonstration of calcified left upper lobe airspace opacity stable from 2024. No pleural effusion. No pneumothorax. HEART AND MEDIASTINUM: Aortic atherosclerosis. No acute abnormality of the cardiac silhouette. BONES AND SOFT TISSUES: Thoracic degenerative changes. IMPRESSION: 1. No acute cardiopulmonary abnormality. 2. Stable calcified left upper lobe airspace opacity. Electronically signed by: Morgane Naveau MD 05/07/2024 06:50 PM EST RP Workstation: [id number]    Procedures Procedures (including critical care time)  Medications Ordered in UC Medications  methylPREDNISolone  sodium succinate (SOLU-MEDROL ) 125 mg/2 mL injection 80 mg (80 mg Intramuscular Given 05/07/24 1829)  ipratropium-albuterol  (DUONEB) 0.5-2.5 (3) MG/3ML nebulizer solution 3 mL (3 mLs Nebulization Given 05/07/24 1830)    Initial Impression / Assessment and  Plan / UC Course  I have reviewed the triage vital signs and the nursing notes.  Pertinent labs & imaging results that were available during my care of the patient were reviewed by me and considered in my medical decision making (see chart for details).     Acute cough - Plan: DG Chest 2 View, DG Chest 2 View  Shortness of breath - Plan: DG Chest 2 View, DG Chest 2 View  Non-recurrent acute suppurative otitis media of right ear without spontaneous rupture of tympanic membrane  Acute bronchitis, unspecified organism   EKG done today.  This shows normal sinus rhythm.  Chest x-ray done today.  Final evaluation by the radiologist does not show pneumonia or other acute process.  Symptoms and physical exam findings are most consistent with bronchitis with upper respiratory infection and right otitis media.  We have treated with the following: DuoNeb breathing treatment done today  in clinic due to significant wheezing and decreased breath sounds in the bases bilateral Medrol  injection given today. This is a steroid to help with airway inflammation Azithromycin  250mg  Take 2 tablets today and the 1 tablet daily for 4 more days.  Benzonatate (tessalon) 100 mg every 8 hours as needed for cough.   Continue albuterol  nebulizing treatments and albuterol  inhaler as needed for shortness of breath and wheezing. Make sure to stay hydrated by drinking plenty of water. Return to urgent care or PCP if symptoms worsen or fail to resolve.    Final Clinical Impressions(s) / UC Diagnoses   Final diagnoses:  Acute cough  Shortness of breath  Non-recurrent acute suppurative otitis media of right ear without spontaneous rupture of tympanic membrane  Acute bronchitis, unspecified organism     Discharge Instructions      EKG done today.  This shows normal sinus rhythm.  Chest x-ray done today.  Final evaluation by the radiologist does not show pneumonia or other acute process.  Symptoms and physical exam  findings are most consistent with bronchitis with upper respiratory infection and right otitis media.  We have treated with the following: DuoNeb breathing treatment done today in clinic due to significant wheezing and decreased breath sounds in the bases bilateral Medrol  injection given today. This is a steroid to help with airway inflammation Azithromycin  250mg  Take 2 tablets today and the 1 tablet daily for 4 more days.  Benzonatate (tessalon) 100 mg every 8 hours as needed for cough.   Continue albuterol  nebulizing treatments and albuterol  inhaler as needed for shortness of breath and wheezing. Make sure to stay hydrated by drinking plenty of water. Return to urgent care or PCP if symptoms worsen or fail to resolve.       ED Prescriptions     Medication Sig Dispense Auth. Provider   azithromycin  (ZITHROMAX ) 250 MG tablet Take first 2 tablets together, then 1 every day until finished. 6 tablet Teresa Norris A, PA-C   benzonatate (TESSALON) 100 MG capsule Take 1 capsule (100 mg total) by mouth every 8 (eight) hours. 21 capsule Teresa Norris LABOR, [place]      PDMP not reviewed this encounter.   Teresa Norris LABOR, [place] 05/07/24 1910

## 2024-05-23 ENCOUNTER — Other Ambulatory Visit

## 2024-05-23 ENCOUNTER — Ambulatory Visit

## 2024-05-23 ENCOUNTER — Ambulatory Visit: Admitting: Physician Assistant

## 2024-05-23 ENCOUNTER — Encounter: Payer: Self-pay | Admitting: Physician Assistant

## 2024-05-23 VITALS — BP 139/62 | HR 70 | Resp 20 | Wt 161.0 lb

## 2024-05-23 DIAGNOSIS — R413 Other amnesia: Secondary | ICD-10-CM | POA: Diagnosis not present

## 2024-05-23 LAB — TSH: TSH: 3.14 m[IU]/L (ref 0.40–4.50)

## 2024-05-23 LAB — VITAMIN B12: Vitamin B-12: 740 pg/mL (ref 200–1100)
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
## 2024-05-24 ENCOUNTER — Ambulatory Visit: Payer: Self-pay | Admitting: Physician Assistant

## 2024-05-28 ENCOUNTER — Ambulatory Visit: Admitting: Dermatology

## 2024-05-31 ENCOUNTER — Other Ambulatory Visit: Payer: Self-pay | Admitting: Family Medicine

## 2024-05-31 DIAGNOSIS — F439 Reaction to severe stress, unspecified: Secondary | ICD-10-CM

## 2024-05-31 DIAGNOSIS — F418 Other specified anxiety disorders: Secondary | ICD-10-CM

## 2024-06-01 ENCOUNTER — Encounter (HOSPITAL_BASED_OUTPATIENT_CLINIC_OR_DEPARTMENT_OTHER): Payer: Self-pay | Admitting: Emergency Medicine

## 2024-06-01 ENCOUNTER — Other Ambulatory Visit: Payer: Self-pay

## 2024-06-01 ENCOUNTER — Emergency Department (HOSPITAL_BASED_OUTPATIENT_CLINIC_OR_DEPARTMENT_OTHER)

## 2024-06-01 ENCOUNTER — Ambulatory Visit
Admission: EM | Admit: 2024-06-01 | Discharge: 2024-06-01 | Disposition: A | Discharge status: Home / Self Care | Attending: Family Medicine | Admitting: Family Medicine

## 2024-06-01 ENCOUNTER — Emergency Department (HOSPITAL_BASED_OUTPATIENT_CLINIC_OR_DEPARTMENT_OTHER)
Admission: EM | Admit: 2024-06-01 | Discharge: 2024-06-01 | Disposition: A | Discharge status: Home / Self Care | Attending: Emergency Medicine | Admitting: Emergency Medicine

## 2024-06-01 DIAGNOSIS — R519 Headache, unspecified: Secondary | ICD-10-CM | POA: Insufficient documentation

## 2024-06-01 DIAGNOSIS — R059 Cough, unspecified: Secondary | ICD-10-CM | POA: Insufficient documentation

## 2024-06-01 DIAGNOSIS — R911 Solitary pulmonary nodule: Secondary | ICD-10-CM | POA: Insufficient documentation

## 2024-06-01 DIAGNOSIS — R531 Weakness: Secondary | ICD-10-CM

## 2024-06-01 DIAGNOSIS — R195 Other fecal abnormalities: Secondary | ICD-10-CM

## 2024-06-01 DIAGNOSIS — R42 Dizziness and giddiness: Secondary | ICD-10-CM | POA: Insufficient documentation

## 2024-06-01 DIAGNOSIS — R1030 Lower abdominal pain, unspecified: Secondary | ICD-10-CM | POA: Insufficient documentation

## 2024-06-01 DIAGNOSIS — Z79899 Other long term (current) drug therapy: Secondary | ICD-10-CM | POA: Insufficient documentation

## 2024-06-01 DIAGNOSIS — R197 Diarrhea, unspecified: Secondary | ICD-10-CM | POA: Insufficient documentation

## 2024-06-01 LAB — CBC
HCT: 39.9 % (ref 36.0–46.0)
Hemoglobin: 12.9 g/dL (ref 12.0–15.0)
MCH: 28.9 pg (ref 26.0–34.0)
MCHC: 32.3 g/dL (ref 30.0–36.0)
MCV: 89.3 fL (ref 80.0–100.0)
Platelets: 176 K/uL (ref 150–400)
RBC: 4.47 MIL/uL (ref 3.87–5.11)
RDW: 13.5 % (ref 11.5–15.5)
WBC: 8.3 K/uL (ref 4.0–10.5)
nRBC: 0 % (ref 0.0–0.2)

## 2024-06-01 LAB — URINALYSIS, ROUTINE W REFLEX MICROSCOPIC
Bilirubin Urine: NEGATIVE
Glucose, UA: NEGATIVE mg/dL
Hgb urine dipstick: NEGATIVE
Ketones, ur: NEGATIVE mg/dL
Nitrite: NEGATIVE
Protein, ur: NEGATIVE mg/dL
Specific Gravity, Urine: >=1.03 (ref 1.005–1.030)
pH: 6 (ref 5.0–8.0)

## 2024-06-01 LAB — COMPREHENSIVE METABOLIC PANEL WITH GFR
ALT: 15 U/L (ref 0–44)
AST: 26 U/L (ref 15–41)
Albumin: 4.2 g/dL (ref 3.5–5.0)
Alkaline Phosphatase: 75 U/L (ref 38–126)
Anion gap: 12 (ref 5–15)
BUN: 12 mg/dL (ref 8–23)
CO2: 26 mmol/L (ref 22–32)
Calcium: 9.3 mg/dL (ref 8.9–10.3)
Chloride: 108 mmol/L (ref 98–111)
Creatinine, Ser: 0.85 mg/dL (ref 0.44–1.00)
GFR, Estimated: >60 mL/min (ref >60)
Glucose, Bld: 108 mg/dL — ABNORMAL HIGH (ref 70–99)
Potassium: 3.7 mmol/L (ref 3.5–5.1)
Sodium: 146 mmol/L — ABNORMAL HIGH (ref 135–145)
Total Bilirubin: 0.3 mg/dL (ref 0.0–1.2)
Total Protein: 6.1 g/dL — ABNORMAL LOW (ref 6.5–8.1)

## 2024-06-01 LAB — RESP PANEL BY RT-PCR (RSV, FLU A&B, COVID)  RVPGX2
Influenza A by PCR: NEGATIVE
Influenza B by PCR: NEGATIVE
Resp Syncytial Virus by PCR: NEGATIVE
SARS Coronavirus 2 by RT PCR: NEGATIVE

## 2024-06-01 LAB — URINALYSIS, MICROSCOPIC (REFLEX)

## 2024-06-01 LAB — PROTIME-INR
INR: 0.9 (ref 0.8–1.2)
Prothrombin Time: 12.1 s (ref 11.4–15.2)

## 2024-06-01 LAB — LIPASE, BLOOD: Lipase: 29 U/L (ref 11–51)

## 2024-06-01 LAB — OCCULT BLOOD X 1 CARD TO LAB, STOOL: Fecal Occult Bld: NEGATIVE

## 2024-06-01 MED ORDER — IOHEXOL 300 MG/ML  SOLN
100.0000 mL | Freq: Once | INTRAMUSCULAR | Status: AC | PRN
  Administered 2024-06-01: 80 mL via INTRAVENOUS

## 2024-06-01 NOTE — Discharge Instructions (Addendum)
 Please note that your CT scan showed that you have incidental small pulmonary nodules or spots in your right lung.  You can follow-up with your primary care clinic for this.  If you do not have any risk factors for lung cancer, no further workup or testing is needed.  If you do have risk factors, you may need a repeat CT scan in 3 to 6 months.  Discussed this with your doctor's office who can review the imaging report.

## 2024-06-01 NOTE — ED Provider Notes (Signed)
 [facility] Provider Note   CSN: [id number] Arrival date & time: 06/01/24  1443     Patient presents with: Dizziness   Tiffany Murphy is a 73 y.o. female here with several complaints.  She reports being treated azithromycin  and steroids about 1-1/2 weeks ago for cough and congestion and symptom for viral infection.  She says that for the past 4 to 5 days she has noted some cramping lower abdominal pain.  She has noted dark stools, although this appeared to resolve today.  She has taken Pepto-Bismol at the start of her symptoms.  She does not take blood thinners and denies history of GI bleeding.  She says she has intermittent dizziness with the combination of lightheadedness and vertigo.  Her son at the bedside clarifies that the symptoms are also more chronic, she sees a neurologist for this, she also has chronic headaches, without significant change.  She said she was told to come into the ER because of her dark stools and dizziness.  Patient does note she had diarrhea significantly over this week that has slowed up, still has loose stools   HPI     Prior to Admission medications  Medication Sig Start Date End Date Taking? Authorizing Provider  acyclovir  (ZOVIRAX ) 200 MG capsule Take 200 mg by mouth daily as needed (for break out). 08/17/21   [provider]  albuterol  (PROVENTIL ) (2.5 MG/3ML) 0.083% nebulizer solution Take 3 mLs (2.5 mg total) by nebulization every 6 (six) hours as needed for wheezing or shortness of breath. 12/09/22   Berneta Elsie Sayre, MD  albuterol  (VENTOLIN  HFA) [phone] Base) MCG/ACT inhaler Inhale 2 puffs into the lungs every 6 (six) hours as needed for wheezing. 10/15/21   Tanda Bleacher, MD  amLODipine  (NORVASC ) 5 MG tablet TAKE 1 TABLET EVERY DAY 03/21/23   Berneta Elsie Sayre, MD  azithromycin  (ZITHROMAX ) 250 MG tablet Take first 2 tablets together, then 1 every day until finished. 05/07/24   White,  Elizabeth A, PA-C  benzonatate  (TESSALON ) 100 MG capsule Take 1 capsule (100 mg total) by mouth every 8 (eight) hours. 05/07/24   White, Almarie LABOR, PA-C  buPROPion  (WELLBUTRIN  XL) 300 MG 24 hr tablet Take 1 tablet (300 mg total) by mouth daily. 06/08/23   Berneta Elsie Sayre, MD  buPROPion  (ZYBAN ) 150 MG 12 hr tablet Take 150 mg by mouth daily.    [provider]  Cyanocobalamin  (VITAMIN B 12 PO) Take 1 tablet by mouth daily.    [provider]  donepezil (ARICEPT) 10 MG tablet Take 10 mg by mouth daily. 03/21/24   [provider]  fluticasone  (FLONASE ) 50 MCG/ACT nasal spray Place 2 sprays into each nostril once a day 12/09/22   Berneta Elsie Sayre, MD  Fluticasone -Umeclidin-Vilant (TRELEGY ELLIPTA ) 100-62.5-25 MCG/ACT AEPB Inhale 1 puff into the lungs daily. 07/05/23   Olalere, Jennet LABOR, MD  gabapentin  (NEURONTIN ) 300 MG capsule TAKE 1 CAPSULE BY MOUTH IN THE MORNING AND 1 IN THE EVENING AND 2 AT BEDTIME 12/30/22   Webb, Padonda B, FNP  hydrocortisone  (ANUSOL -HC) 2.5 % rectal cream Insert and apply rectally 2 times a day for 10 days 08/31/21     ipratropium-albuterol  (DUONEB) 0.5-2.5 (3) MG/3ML SOLN Take 3 mLs by nebulization every 4 (four) hours as needed. 04/26/24   Olalere, Jennet LABOR, MD  LORazepam  (ATIVAN ) 0.5 MG tablet Take 0.5 mg by mouth every 6 (six) hours as needed.    [provider]  losartan  (  COZAAR ) 100 MG tablet Take 1 tablet by mouth once daily 06/02/22   Berneta Elsie Sayre, MD  metFORMIN  (GLUCOPHAGE -XR) 500 MG 24 hr tablet TAKE 1 TABLET EVERY DAY WITH BREAKFAST 03/28/23   Berneta Elsie Sayre, MD  montelukast  (SINGULAIR ) 10 MG tablet Take 1 tablet (10 mg total) by mouth at bedtime. 04/04/22   Berneta Elsie Sayre, MD  Multiple Vitamin (MULTIVITAMIN WITH MINERALS) TABS tablet Take 1 tablet by mouth daily.    [provider]  nicotine  (NICODERM CQ  - DOSED IN MG/24 HOURS) 14 mg/24hr patch Place 1 patch (14 mg total) onto the skin  daily. Patient not taking: Reported on 05/23/2024 04/25/24   Neda Jennet LABOR, MD  nicotine  polacrilex (COMMIT) 4 MG lozenge Take 1 lozenge (4 mg total) by mouth every 2 (two) hours as needed for smoking cessation. 04/25/24   Olalere, Jennet LABOR, MD  Omega-3 Fatty Acids (FISH OIL) 1000 MG CPDR Take 1,000 mg by mouth daily.    [provider]  omeprazole  (PRILOSEC) 40 MG capsule TAKE 1 CAPSULE EVERY DAY 04/06/23   Berneta Elsie Sayre, MD  oxyCODONE  (ROXICODONE ) 5 MG immediate release tablet Take 1 tablet (5 mg total) by mouth every 4 (four) hours as needed for up to 5 doses for severe pain (pain score 7-10). Patient not taking: Reported on 05/23/2024 08/16/23   Meredith, Savannah F, PA-C  polyethylene glycol powder (GLYCOLAX /MIRALAX ) 17 GM/SCOOP powder Dissolve 1 scoop of medication in water daily as needed for constipation. 12/22/21   Berneta Elsie Sayre, MD  QUEtiapine  (SEROQUEL ) 50 MG tablet Take 1 tablet (50 mg total) by mouth at bedtime. 06/08/23   Berneta Elsie Sayre, MD  rosuvastatin  (CRESTOR ) 40 MG tablet TAKE 1/2 TABLET AT BEDTIME 06/08/23   Berneta Elsie Sayre, MD  sertraline  (ZOLOFT ) 100 MG tablet Take 2 tablets by mouth once daily 11/10/23   Berneta Elsie Sayre, MD    Allergies: Penicillins, Lisinopril , Lovastatin, and Other    Review of Systems  Updated Vital Signs BP (!) 152/56 (BP Location: Right Arm)   Pulse (!) 59   Temp 98.4 F (36.9 C) (Oral)   Resp 18   Ht 5' 2 (1.575 m)   Wt 72.6 kg   SpO2 95%   BMI 29.27 kg/m   Physical Exam Constitutional:      General: She is not in acute distress. HENT:     Head: Normocephalic and atraumatic.  Eyes:     Conjunctiva/sclera: Conjunctivae normal.     Pupils: Pupils are equal, round, and reactive to light.  Cardiovascular:     Rate and Rhythm: Normal rate and regular rhythm.  Pulmonary:     Effort: Pulmonary effort is normal. No respiratory distress.  Abdominal:     General: There is no distension.      Tenderness: There is abdominal tenderness.  Genitourinary:    Comments: No melena noted on rectal exam Skin:    General: Skin is warm and dry.  Neurological:     General: No focal deficit present.     Mental Status: She is alert. Mental status is at baseline.  Psychiatric:        Mood and Affect: Mood normal.        Behavior: Behavior normal.     (all labs ordered are listed, but only abnormal results are displayed) Labs Reviewed  COMPREHENSIVE METABOLIC PANEL WITH GFR - Abnormal; Notable for the following components:      Result Value   Sodium 146 (*)  Glucose, Bld 108 (*)    Total Protein 6.1 (*)    All other components within normal limits  URINALYSIS, ROUTINE W REFLEX MICROSCOPIC - Abnormal; Notable for the following components:   APPearance HAZY (*)    Leukocytes,Ua SMALL (*)    All other components within normal limits  URINALYSIS, MICROSCOPIC (REFLEX) - Abnormal; Notable for the following components:   Bacteria, UA RARE (*)    All other components within normal limits  RESP PANEL BY RT-PCR (RSV, FLU A&B, COVID)  [id number]  CBC  PROTIME-INR  LIPASE, BLOOD  OCCULT BLOOD X 1 CARD TO LAB, STOOL    EKG: EKG Interpretation Date/Time:  Saturday June 01 2024 15:45:03 EST Ventricular Rate:  57 PR Interval:  159 QRS Duration:  89 QT Interval:  455 QTC Calculation: 443 R Axis:   33  Text Interpretation: Sinus rhythm Confirmed by Cottie Cough [phone]) on 06/01/2024 3:51:11 PM  Radiology: CT ABDOMEN PELVIS W CONTRAST Result Date: 06/01/2024 CLINICAL DATA:  Diverticulitis, complication suspected EXAM: CT ABDOMEN AND PELVIS WITH CONTRAST TECHNIQUE: Multidetector CT imaging of the abdomen and pelvis was performed using the standard protocol following bolus administration of intravenous contrast. RADIATION DOSE REDUCTION: This exam was performed according to the departmental dose-optimization program which includes automated exposure control, adjustment of the mA and/or  kV according to patient size and/or use of iterative reconstruction technique. CONTRAST:  80mL OMNIPAQUE  IOHEXOL  300 MG/ML  SOLN COMPARISON:  March 17, 2021 FINDINGS: Lower chest: Scattered calcified pulmonary nodules. Two adjacent noncalcified RIGHT lower lobe pulmonary nodule measuring 5 x 3 mm (series 5, image 2). Hepatobiliary: Favored hypoattenuation of the liver. Scattered punctate calc ossific densities throughout the liver. Gallbladder is unremarkable. No extrahepatic biliary ductal dilation. Pancreas: Mildly prominent pancreatic duct measuring 3 mm in the pancreatic body, nonspecific. No focal pancreatic lesion or peripancreatic fat stranding identified. Spleen: Spleen is the upper limits of normal spanning 13 cm. Multiple punctate calcific densities consistent with sequela of prior granulomatous infection. Adrenals/Urinary Tract: Adrenal glands are unremarkable. Kidneys enhance symmetrically. No hydronephrosis. No obstructing nephrolithiasis. Bladder is decompressed and unremarkable. Stomach/Bowel: No evidence of bowel obstruction. Moderate hiatal hernia. Appendix is surgically absent. Fluid-filled colon. Diffuse rugal prominence of the stomach. Vascular/Lymphatic: Severe atherosclerotic calcifications of the nonaneurysmal abdominal aorta. No lymphadenopathy identified. Reproductive: Status post hysterectomy. No adnexal masses. Other: Small fat containing LEFT inguinal hernia. Musculoskeletal: Predominately facet arthropathy of the lumbar spine. Osteopenia. IMPRESSION: 1. Fluid-filled colon as can be seen in the setting of diarrheal illness. 2. Diffuse rugal prominence of the stomach as can be seen in the setting of gastritis. 3. Moderate hiatal hernia 4. Two adjacent noncalcified RIGHT lower lobe pulmonary nodule measuring 5 x 3 mm. No follow-up needed if patient is low-risk (and has no known or suspected primary neoplasm). Non-contrast chest CT can be considered in 12 months if patient is high-risk.  This recommendation follows the consensus statement: Guidelines for Management of Incidental Pulmonary Nodules Detected on CT Images: From the Fleischner Society 2017; Radiology 2017; [id number]. Aortic Atherosclerosis (ICD10-I70.0). Electronically Signed   By: Corean Salter M.D.   On: 06/01/2024 16:38     Procedures   Medications Ordered in the ED  iohexol  (OMNIPAQUE ) 300 MG/ML solution 100 mL (80 mLs Intravenous Contrast Given 06/01/24 1609)                                    Medical Decision Making  Amount and/or Complexity of Data Reviewed Labs: ordered. Radiology: ordered. ECG/medicine tests: ordered.  Risk Prescription drug management.   This patient presents to the ED with concern for constellation of symptoms, including dizziness and headache which appear chronic, as well as abdominal pain and dark stools.. This involves an extensive number of treatment options, and is a complaint that carries with it a high risk of complications and morbidity.  The differential diagnosis includes colitis versus diverticulitis versus viral syndrome including COVID and influenza versus other intra-abdominal process.  Headache and dizziness features appear to be chronic per supplement has provided by the patient and her son at the bedside.  Red flag features for stroke, doubt subarachnoid hemorrhage or meningitis.  No indication for LP or CT imaging or neuroimaging of the brain at this time   Additional history obtained from the patient's son at the bedside  I ordered and personally interpreted labs.  The pertinent results include: No emergent finding  CT abdomen pelvis was ordered, personally viewed interpreted, notable for no emergent findings, incidental pulmonary nodule noted but the patient is low risk  The patient was maintained on a cardiac monitor.  I personally viewed and interpreted the cardiac monitored which showed an underlying rhythm of: Sinus rhythm  Per my interpretation  the patient's ECG shows no acute ischemia   I have reviewed the patients home medicines and have made adjustments as needed  The patient and her son are quite comfortable with going home.  She does not appear clinically dehydrated.  I suspect her dizziness is a chronic ongoing issue.  I do not think she needs any further workup in the ED.  I suspect she may have had a viral illness that is causing some residual diarrhea but I do not see an indication for antibiotics, no evidence of colitis or diverticulitis.  Dispostion:  After consideration of the diagnostic results and the patients response to treatment, I feel that the patent would benefit from close outpatient follow-up.      Final diagnoses:  Dizziness  Diarrhea, unspecified type  Pulmonary nodule    ED Discharge Orders     None          Cottie Donnice PARAS, MD 06/01/24 1730

## 2024-06-01 NOTE — ED Triage Notes (Addendum)
 Pt presents with complaints of SOB, dizziness, weakness, racing heart, headaches, and black stools. Stools were black for 4-5 days, this has now resolved per pt. Other symptoms listed are still present. Hx of IBS. Was recently dx with dementia. Has intermittent episodes of stomach pain/hardness. Rates as a 4/10 at this time. Pepto-Bismol taken three days ago at home with improvement/relief in pain. None taken recently.

## 2024-06-01 NOTE — ED Triage Notes (Signed)
 Pt reports dizziness, HA, abd pain and black stools x 4-5 days

## 2024-06-01 NOTE — ED Provider Notes (Signed)
 [facility]    CSN: [id number] Arrival date & time: 06/01/24  1359      History   Chief Complaint Chief Complaint  Patient presents with   Dizziness   Melena    HPI Kelvin Burpee is a 73 y.o. female.   HPI 73 year old female presents with dizziness/lightheadedness, weakness, and dark stools for 4-5 days.  Patient is accompanied by her son today.  PMH significant for tobacco use, chronic bronchitis, and IBS.  Past Medical History:  Diagnosis Date   Allergy    THINK I GOT THEM   Anxiety    Arthritis    BODY FULL OF ARTHRITIS   Asthma    Bronchitis    Cancer (HCC)    RIGHT LEG SQUAMOUS CELL   Closed fracture of left distal radius 10/25/2018   COPD (chronic obstructive pulmonary disease) (HCC)    Depression    GERD (gastroesophageal reflux disease)    Headache    Hypercholesteremia    Hypertension    IBS (irritable bowel syndrome)    Pre-diabetes    Sleep apnea    Tobacco abuse    Vitamin D  deficiency     Patient Active Problem List   Diagnosis Date Noted   Prediabetes 08/03/2022   Fracture of radial shaft, with ulna, right, open 06/17/2022   Open wound of right forearm due to dog bite 06/17/2022   Fracture of radial shaft, with ulna, left, open 06/17/2022   Skin lesion of chest wall 03/24/2022   Need for influenza vaccination 03/24/2022   Stress at home 12/22/2021   Slow transit constipation 12/22/2021   Colitis 03/17/2021   Sepsis (HCC) 03/17/2021   AKI (acute kidney injury) 03/17/2021   Lumbar radiculopathy 12/17/2020   Degenerative spondylolisthesis 11/13/2020   Pain of lumbar spine 11/13/2020   Subjective memory complaints 02/21/2019   Frequency of urination- pt thinks due to HCTZ BP med 02/21/2019   Increased frequency of urination 02/21/2019   Mixed hyperlipidemia 11/13/2018   Hyperlipidemia 11/13/2018   Closed fracture of left distal radius 10/25/2018   Closed fracture of distal end of left radius 10/25/2018   Sleep  difficulties- poor sleep hygeine 07/12/2018   Chronic pain disorder 07/12/2018   Chronic joint pain 07/12/2018   Chronic pain syndrome 07/12/2018   Arthralgia 07/12/2018   Difficulty sleeping 07/12/2018   OSA (obstructive sleep apnea) 04/30/2018   Chronic fatigue 04/30/2018   Lung nodule seen on imaging study 04/30/2018   Vitamin D  deficiency 04/30/2018   Fatigue 04/30/2018   Lung nodule 04/30/2018   Obstructive sleep apnea syndrome 04/30/2018   Elevated LDL cholesterol level 04/11/2018   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   GERD without esophagitis 04/11/2018   Obesity, Class I, BMI 30-34.9 04/11/2018   Glucose intolerance (impaired glucose tolerance) 04/11/2018   Irritable bowel syndrome 04/11/2018   Chronic bilateral upper abdominal pain 04/11/2018   Panic attacks 04/11/2018   Mood disorder 04/11/2018   Hypokalemia 04/11/2018   Environmental and seasonal allergies 04/11/2018   Chronic abdominal pain 04/11/2018   Asthma 04/11/2018   Chronic obstructive pulmonary disease (HCC) 04/11/2018   Elevated low density lipoprotein (LDL) cholesterol level 04/11/2018   Predisposition to allergic reaction 04/11/2018   Impaired glucose tolerance 04/11/2018   Gastroesophageal reflux disease 04/11/2018   Class 1 obesity 04/11/2018   Panic attack 04/11/2018   Tobacco user 04/11/2018   Elevated coronary artery calcium  score 12/01/2014   Calcification of coronary artery 12/01/2014   Essential hypertension 07/11/2013   Depression  with anxiety 07/11/2013   Pure hypercholesterolemia 07/11/2013   Mixed anxiety and depressive disorder 07/11/2013   Benign essential hypertension 07/11/2013   Overweight 06/15/2013   Nicotine  dependence 06/15/2013    Past Surgical History:  Procedure Laterality Date   APPENDECTOMY     COLONOSCOPY  08/26/2015   Colonic polyps status post polypectomy. Minimal sigmoid diverticulosis.    ESOPHAGOGASTRODUODENOSCOPY  02/25/2011   Large hiatal hernia otherwise  normal EGD.   HARDWARE REMOVAL Right 08/16/2022   Procedure: PLATE REMOVAL;  Surgeon: Murrell Drivers, MD;  Location: [facility];  Service: Orthopedics;  Laterality: Right;   I & D EXTREMITY Right 06/17/2022   Procedure: IRRIGATION AND DEBRIDEMENT RIGHT UPPER EXTREMITY;  Surgeon: Murrell Drivers, MD;  Location: [facility];  Service: Orthopedics;  Laterality: Right;   OPEN REDUCTION INTERNAL FIXATION (ORIF) DISTAL RADIAL FRACTURE Left 10/25/2018   Procedure: OPEN REDUCTION INTERNAL FIXATION (ORIF)LEFT  DISTAL RADIAL FRACTURE;  Surgeon: Josefina Chew, MD;  Location: [facility];  Service: Orthopedics;  Laterality: Left;   OPEN REDUCTION INTERNAL FIXATION (ORIF) DISTAL RADIAL FRACTURE Right 07/28/2022   Procedure: OPEN REDUCTION INTERNAL FIXATION (ORIF) RIGHT DISTAL RADIUS FRACTURE CLOSED TREATMENT OF DISTAL ULNA FRACTURE;  Surgeon: Murrell Drivers, MD;  Location: [facility];  Service: Orthopedics;  Laterality: Right;  120 MIN   OPEN REDUCTION INTERNAL FIXATION (ORIF) DISTAL RADIAL FRACTURE Right 08/16/2022   Procedure: OPEN REDUCTION INTERNAL FIXATION (ORIF) RIGHT RADIUS FRACTURE;  Surgeon: Murrell Drivers, MD;  Location: [facility];  Service: Orthopedics;  Laterality: Right;  90 MIN   POLYPECTOMY     VAGINAL HYSTERECTOMY      OB History   No obstetric history on file.      Home Medications    Prior to Admission medications  Medication Sig Start Date End Date Taking? Authorizing Provider  acyclovir  (ZOVIRAX ) 200 MG capsule Take 200 mg by mouth daily as needed (for break out). 08/17/21   [provider]  albuterol  (PROVENTIL ) (2.5 MG/3ML) 0.083% nebulizer solution Take 3 mLs (2.5 mg total) by nebulization every 6 (six) hours as needed for wheezing or shortness of breath. 12/09/22   Berneta Elsie Sayre, MD  albuterol  (VENTOLIN  HFA) 108 [phone]) MCG/ACT inhaler Inhale 2 puffs into the lungs every 6 (six) hours as needed for wheezing. 10/15/21    Tanda Bleacher, MD  amLODipine  (NORVASC ) 5 MG tablet TAKE 1 TABLET EVERY DAY 03/21/23   Berneta Elsie Sayre, MD  azithromycin  (ZITHROMAX ) 250 MG tablet Take first 2 tablets together, then 1 every day until finished. 05/07/24   White, Elizabeth A, PA-C  benzonatate  (TESSALON ) 100 MG capsule Take 1 capsule (100 mg total) by mouth every 8 (eight) hours. 05/07/24   White, Elizabeth A, PA-C  buPROPion  (WELLBUTRIN  XL) 300 MG 24 hr tablet Take 1 tablet (300 mg total) by mouth daily. 06/08/23   Berneta Elsie Sayre, MD  buPROPion  (ZYBAN ) 150 MG 12 hr tablet Take 150 mg by mouth daily.    [provider]  Cyanocobalamin  (VITAMIN B 12 PO) Take 1 tablet by mouth daily.    [provider]  donepezil (ARICEPT) 10 MG tablet Take 10 mg by mouth daily. 03/21/24   [provider]  fluticasone  (FLONASE ) 50 MCG/ACT nasal spray Place 2 sprays into each nostril once a day 12/09/22   Berneta Elsie Sayre, MD  Fluticasone -Umeclidin-Vilant (TRELEGY ELLIPTA ) 100-62.5-25 MCG/ACT AEPB Inhale 1 puff into the lungs daily. 07/05/23   Neda Jennet LABOR, MD  gabapentin  (NEURONTIN )  300 MG capsule TAKE 1 CAPSULE BY MOUTH IN THE MORNING AND 1 IN THE EVENING AND 2 AT BEDTIME 12/30/22   Webb, Padonda B, FNP  hydrocortisone  (ANUSOL -HC) 2.5 % rectal cream Insert and apply rectally 2 times a day for 10 days 08/31/21     ipratropium-albuterol  (DUONEB) 0.5-2.5 (3) MG/3ML SOLN Take 3 mLs by nebulization every 4 (four) hours as needed. 04/26/24   Olalere, Jennet LABOR, MD  LORazepam  (ATIVAN ) 0.5 MG tablet Take 0.5 mg by mouth every 6 (six) hours as needed.    [provider]  losartan  (COZAAR ) 100 MG tablet Take 1 tablet by mouth once daily 06/02/22   Berneta Elsie Sayre, MD  metFORMIN  (GLUCOPHAGE -XR) 500 MG 24 hr tablet TAKE 1 TABLET EVERY DAY WITH BREAKFAST 03/28/23   Berneta Elsie Sayre, MD  montelukast  (SINGULAIR ) 10 MG tablet Take 1 tablet (10 mg total) by mouth at bedtime. 04/04/22   Berneta Elsie Sayre, MD  Multiple Vitamin (MULTIVITAMIN WITH MINERALS) TABS tablet Take 1 tablet by mouth daily.    [provider]  nicotine  (NICODERM CQ  - DOSED IN MG/24 HOURS) 14 mg/24hr patch Place 1 patch (14 mg total) onto the skin daily. Patient not taking: Reported on 05/23/2024 04/25/24   Neda Jennet LABOR, MD  nicotine  polacrilex (COMMIT) 4 MG lozenge Take 1 lozenge (4 mg total) by mouth every 2 (two) hours as needed for smoking cessation. 04/25/24   Olalere, Jennet LABOR, MD  Omega-3 Fatty Acids (FISH OIL) 1000 MG CPDR Take 1,000 mg by mouth daily.    [provider]  omeprazole  (PRILOSEC) 40 MG capsule TAKE 1 CAPSULE EVERY DAY 04/06/23   Berneta Elsie Sayre, MD  oxyCODONE  (ROXICODONE ) 5 MG immediate release tablet Take 1 tablet (5 mg total) by mouth every 4 (four) hours as needed for up to 5 doses for severe pain (pain score 7-10). Patient not taking: Reported on 05/23/2024 08/16/23   Meredith, Savannah F, PA-C  polyethylene glycol powder (GLYCOLAX /MIRALAX ) 17 GM/SCOOP powder Dissolve 1 scoop of medication in water daily as needed for constipation. 12/22/21   Berneta Elsie Sayre, MD  QUEtiapine  (SEROQUEL ) 50 MG tablet Take 1 tablet (50 mg total) by mouth at bedtime. 06/08/23   Berneta Elsie Sayre, MD  rosuvastatin  (CRESTOR ) 40 MG tablet TAKE 1/2 TABLET AT BEDTIME 06/08/23   Berneta Elsie Sayre, MD  sertraline  (ZOLOFT ) 100 MG tablet Take 2 tablets by mouth once daily 11/10/23   Berneta Elsie Sayre, MD    Family History Family History  Problem Relation Age of Onset   CAD Mother 68   Alzheimer's disease Father 69   Diabetes Father    Colon cancer Neg Hx    Esophageal cancer Neg Hx    Colon polyps Neg Hx    Rectal cancer Neg Hx    Stomach cancer Neg Hx    Breast cancer Neg Hx     Social History Social History[1]   Allergies   Penicillins, Lisinopril , Lovastatin, and Other   Review of Systems Review of Systems  Constitutional:  Positive for fever.   Respiratory:  Positive for shortness of breath.   Gastrointestinal:  Positive for blood in stool.  Neurological:  Positive for dizziness, weakness and light-headedness.  All other systems reviewed and are negative.    Physical Exam Triage Vital Signs ED Triage Vitals  Encounter Vitals Group     BP 06/01/24 1410 (!) 150/66     Girls Systolic BP Percentile --      Girls Diastolic BP  Percentile --      Boys Systolic BP Percentile --      Boys Diastolic BP Percentile --      Pulse Rate 06/01/24 1410 65     Resp 06/01/24 1410 16     Temp 06/01/24 1410 98.4 F (36.9 C)     Temp Source 06/01/24 1410 Oral     SpO2 06/01/24 1410 93 %     Weight --      Height --      Head Circumference --      Peak Flow --      Pain Score 06/01/24 1408 4     Pain Loc --      Pain Education --      Exclude from Growth Chart --    No data found.  Updated Vital Signs BP (!) 150/66 (BP Location: Right Arm)   Pulse 65   Temp 98.4 F (36.9 C) (Oral)   Resp 16   Ht 5' 2 (1.575 m)   Wt 160 lb (72.6 kg)   SpO2 93%   BMI 29.26 kg/m   Visual Acuity Right Eye Distance:   Left Eye Distance:   Bilateral Distance:    Right Eye Near:   Left Eye Near:    Bilateral Near:     Physical Exam Vitals and nursing note reviewed.  Constitutional:      Appearance: Normal appearance. She is normal weight. She is ill-appearing.  HENT:     Head: Normocephalic and atraumatic.     Right Ear: Tympanic membrane, ear canal and external ear normal.     Left Ear: Tympanic membrane, ear canal and external ear normal.     Nose: Nose normal.     Mouth/Throat:     Mouth: Mucous membranes are moist.     Pharynx: Oropharynx is clear.  Eyes:     Extraocular Movements: Extraocular movements intact.     Conjunctiva/sclera: Conjunctivae normal.     Pupils: Pupils are equal, round, and reactive to light.  Cardiovascular:     Rate and Rhythm: Normal rate and regular rhythm.     Pulses: Normal pulses.     Heart  sounds: Normal heart sounds. No murmur heard. Pulmonary:     Effort: Pulmonary effort is normal.     Breath sounds: Normal breath sounds. No wheezing, rhonchi or rales.  Musculoskeletal:        General: Normal range of motion.  Skin:    General: Skin is warm and dry.  Neurological:     General: No focal deficit present.     Mental Status: She is alert and oriented to person, place, and time. Mental status is at baseline.  Psychiatric:        Mood and Affect: Mood normal.        Behavior: Behavior normal.      UC Treatments / Results  Labs (all labs ordered are listed, but only abnormal results are displayed) Labs Reviewed - No data to display  EKG   Radiology No results found.  Procedures Procedures (including critical care time)  Medications Ordered in UC Medications - No data to display  Initial Impression / Assessment and Plan / UC Course  I have reviewed the triage vital signs and the nursing notes.  Pertinent labs & imaging results that were available during my care of the patient were reviewed by me and considered in my medical decision making (see chart for details).     MDM: 1.  Dizziness-advised patient/son go to [facility] ED now for further evaluation of dizziness; 2.  Dark stools-advised patient/son go to [facility] ED now for further evaluation of dark stools.  Patient/son agreed and verbalized understanding of these instructions and this plan of care today. Final Clinical Impressions(s) / UC Diagnoses   Final diagnoses:  Weakness  Dark stools  Dizziness     Discharge Instructions      Advised patient/son to go to [facility] now for further evaluation of dizziness/lightheadedness, dark-colored stools, and weakness.     ED Prescriptions   None    PDMP not reviewed this encounter.    [1]  Social History Tobacco Use   Smoking status: Former    Current packs/day: 0.00     Average packs/day: 0.5 packs/day for 44.0 years (22.0 ttl pk-yrs)    Types: Cigarettes    Start date: 09/07/1978    Quit date: 09/07/2022    Years since quitting: 1.7    Passive exposure: Current   Smokeless tobacco: Never   Tobacco comments:    in process of quitting  Vaping Use   Vaping status: Former   Devices: did it for 6 months and stopped   Substance Use Topics   Alcohol use: Not Currently   Drug use: Not Currently    Types: Marijuana    Comment: 2 puffs per day-WHEN SICK, last time a month ago     Teddy Sharper, FNP 06/01/24 1430

## 2024-06-01 NOTE — Discharge Instructions (Addendum)
 Advised patient/son to go to [facility] ED now for further evaluation of dizziness/lightheadedness, dark-colored stools, and weakness.

## 2024-06-01 NOTE — ED Notes (Signed)
 Patient transferred from waiting room to ED treatment room. Assuming pt care at this time.

## 2024-06-01 NOTE — ED Notes (Signed)
 Patient is being discharged from the Urgent Care and sent to the Emergency Department via private vehicle with son. Per Ozell Major NP, patient is in need of higher level of care due to weakness and dark stools. Patient is aware and verbalizes understanding of plan of care.   Vitals:   06/01/24 1410  BP: (!) 150/66  Pulse: 65  Resp: 16  Temp: 98.4 F (36.9 C)  SpO2: 93%

## 2024-06-21 ENCOUNTER — Encounter: Admitting: Family Medicine

## 2024-06-24 ENCOUNTER — Ambulatory Visit: Payer: Self-pay

## 2024-06-24 ENCOUNTER — Institutional Professional Consult (permissible substitution): Admitting: Psychology

## 2024-07-02 ENCOUNTER — Telehealth: Payer: Self-pay

## 2024-07-02 NOTE — Telephone Encounter (Signed)
 Copied from CRM #[id number]. Topic: Clinical - Medical Advice >> Jun 28, 2024  1:49 PM Carlyon D wrote: Reason for CRM: PT is calling stated she received the flu shot and still got the flu, so she is questiong why she still got it after getting the flu shot and wants clinical input on this.  pt is not an established pt yet but has a TOC appt 02/16 with Dr. Prentiss. Please call pt if acceptable

## 2024-07-04 ENCOUNTER — Encounter: Admitting: Psychology

## 2024-07-24 ENCOUNTER — Institutional Professional Consult (permissible substitution): Payer: Self-pay | Admitting: Psychology

## 2024-07-24 ENCOUNTER — Ambulatory Visit: Payer: Self-pay

## 2024-07-25 ENCOUNTER — Emergency Department (HOSPITAL_COMMUNITY)

## 2024-07-25 ENCOUNTER — Emergency Department (HOSPITAL_COMMUNITY)
Admission: EM | Admit: 2024-07-25 | Discharge: 2024-07-25 | Disposition: A | Discharge status: Home / Self Care | Source: Home / Self Care | Attending: Emergency Medicine | Admitting: Emergency Medicine

## 2024-07-25 ENCOUNTER — Other Ambulatory Visit: Payer: Self-pay

## 2024-07-25 DIAGNOSIS — R0602 Shortness of breath: Secondary | ICD-10-CM

## 2024-07-25 LAB — PRO BRAIN NATRIURETIC PEPTIDE: Pro Brain Natriuretic Peptide: 139 pg/mL

## 2024-07-25 LAB — TROPONIN T, HIGH SENSITIVITY
Troponin T High Sensitivity: 10 ng/L (ref 0–19)
Troponin T High Sensitivity: 10 ng/L (ref 0–19)

## 2024-07-25 LAB — CBC
HCT: 40 % (ref 36.0–46.0)
Hemoglobin: 13 g/dL (ref 12.0–15.0)
MCH: 29.3 pg (ref 26.0–34.0)
MCHC: 32.5 g/dL (ref 30.0–36.0)
MCV: 90.3 fL (ref 80.0–100.0)
Platelets: 152 10*3/uL (ref 150–400)
RBC: 4.43 MIL/uL (ref 3.87–5.11)
RDW: 14.3 % (ref 11.5–15.5)
WBC: 5.8 10*3/uL (ref 4.0–10.5)
nRBC: 0 % (ref 0.0–0.2)

## 2024-07-25 LAB — LIPASE, BLOOD: Lipase: 22 U/L (ref 11–51)

## 2024-07-25 LAB — BASIC METABOLIC PANEL WITH GFR
Anion gap: 11 (ref 5–15)
BUN: 12 mg/dL (ref 8–23)
CO2: 25 mmol/L (ref 22–32)
Calcium: 9 mg/dL (ref 8.9–10.3)
Chloride: 110 mmol/L (ref 98–111)
Creatinine, Ser: 0.79 mg/dL (ref 0.44–1.00)
GFR, Estimated: >60 mL/min
Glucose, Bld: 93 mg/dL (ref 70–99)
Potassium: 3.5 mmol/L (ref 3.5–5.1)
Sodium: 146 mmol/L — ABNORMAL HIGH (ref 135–145)

## 2024-07-25 MED ORDER — LACTATED RINGERS IV BOLUS
1000.0000 mL | Freq: Once | INTRAVENOUS | Status: AC
  Administered 2024-07-25: 1000 mL via INTRAVENOUS

## 2024-07-25 MED ORDER — IPRATROPIUM-ALBUTEROL 0.5-2.5 (3) MG/3ML IN SOLN
3.0000 mL | Freq: Once | RESPIRATORY_TRACT | Status: AC
  Administered 2024-07-25: 3 mL via RESPIRATORY_TRACT
  Filled 2024-07-25: qty 3

## 2024-07-25 MED ORDER — IOHEXOL 350 MG/ML SOLN
75.0000 mL | Freq: Once | INTRAVENOUS | Status: AC | PRN
  Administered 2024-07-25: 75 mL via INTRAVENOUS

## 2024-07-25 NOTE — ED Notes (Signed)
 Spoke with patient daughter Madelin on the phone. Tammy states she will come pick up the patient in about 15-30 minutes because she lives in [place].

## 2024-07-25 NOTE — Discharge Instructions (Signed)
 Thank you for letting us  take care of you today  You came today for evaluation of headache, belly pain as well as shortness of breath.  We did blood work as well as imaging that was reassuring.  We recommend that you follow-up with your primary care doctor for further assessment.  Please come back to the emergency department if you have persistent or worsening symptoms.

## 2024-07-25 NOTE — ED Triage Notes (Signed)
 Patient bib [facility] from home with complaints of shob, chest pain during cough and inspiration, nausea, diarrhea, loss of appetite, no water for past 2 weeks due to pipes freezing. Abdominal pain,   Lost 10-12lb in past week   Dementia, flu 2x since January, COPD   Given en route:  400 LR 4mg  zofran  Duoneb

## 2024-07-25 NOTE — ED Provider Notes (Signed)
 Supervised resident visit.  Patient here with shortness of breath generalized weakness.  Some abdominal pain.  She is well-appearing.  Symptoms have been going on for a while.  She has been nauseous but not vomiting.  Has had flu illness here recently.  She looks well on exam.  Clear breath sounds.  No signs of volume overload.  She has had blood work that showed no significant leukocytosis anemia or electrolyte abnormality.  Troponin normal.  proBNP normal.  Lipase normal.  CT scan abdomen pelvis showed no acute findings.  Chest x-ray also with no pneumonia or pneumothorax.  I have no concern for PE.  She is not tachycardic not short of breath.  Mostly her discomfort was in her abdomen.  Overall continue supportive care at home but I have no concern for acute process otherwise.  No concern for ACS PE or infectious process at this time.  Discharged in good condition.  Recommend follow-up with primary care.  This chart was dictated using voice recognition software.  Despite best efforts to proofread,  errors can occur which can change the documentation meaning.    Ruthe Cornet, DO 07/25/24 2155

## 2024-07-25 NOTE — ED Provider Notes (Signed)
 " [facility] AT [facility] Provider Note   CSN: [id number] Arrival date & time: 07/25/24  1244     Patient presents with: Shortness of Breath   Tiffany Murphy is a 74 y.o. female with past medical history notable for COPD, otherwise feels well dementia, hypertension, anxiety, depression who presents today for evaluation of multiple complaints.  Patient is complaining about chronic headache for which she has had on and off for the past several months.  Describes intermittent bifrontal headache without any associated visual changes, peripheral weakness or numbness.  Denies any speech changes or confusions.  Additionally patient reports chronic shortness of breath.  Patient reports that she has been short of breath since being diagnosed with COPD several years ago.  Patient had quit smoking for several years but unfortunately recently started smoking for the past few months and is since had worsening shortness of breath.  Has had mild cough.  Denies any frank chest pain.  Patient also describes generalized abdominal discomfort in the setting of 1 week of diarrhea.  Denies any hematemesis, melena, hematochezia.  Has had mild viral type symptoms throughout the month of January.   Shortness of Breath      Prior to Admission medications  Medication Sig Start Date End Date Taking? Authorizing Provider  acyclovir  (ZOVIRAX ) 200 MG capsule Take 200 mg by mouth daily as needed (for break out). 08/17/21   [provider]  albuterol  (PROVENTIL ) (2.5 MG/3ML) 0.083% nebulizer solution Take 3 mLs (2.5 mg total) by nebulization every 6 (six) hours as needed for wheezing or shortness of breath. 12/09/22   Berneta Elsie Sayre, MD  albuterol  (VENTOLIN  HFA) 108 [phone] Base) MCG/ACT inhaler Inhale 2 puffs into the lungs every 6 (six) hours as needed for wheezing. 10/15/21   Tanda Bleacher, MD  amLODipine  (NORVASC ) 5 MG tablet TAKE 1 TABLET EVERY DAY 03/21/23   Berneta Elsie Sayre,  MD  Cyanocobalamin  (VITAMIN B 12 PO) Take 1 tablet by mouth daily.    [provider]  donepezil (ARICEPT) 10 MG tablet Take 10 mg by mouth daily. 03/21/24   [provider]  fluticasone  (FLONASE ) 50 MCG/ACT nasal spray Place 2 sprays into each nostril once a day 12/09/22   Berneta Elsie Sayre, MD  Fluticasone -Umeclidin-Vilant (TRELEGY ELLIPTA ) 100-62.5-25 MCG/ACT AEPB Inhale 1 puff into the lungs daily. 07/05/23   Olalere, Jennet LABOR, MD  gabapentin  (NEURONTIN ) 300 MG capsule TAKE 1 CAPSULE BY MOUTH IN THE MORNING AND 1 IN THE EVENING AND 2 AT BEDTIME 12/30/22   Webb, Padonda B, FNP  ipratropium-albuterol  (DUONEB) 0.5-2.5 (3) MG/3ML SOLN Take 3 mLs by nebulization every 4 (four) hours as needed. 04/26/24   Olalere, Jennet LABOR, MD  LORazepam  (ATIVAN ) 0.5 MG tablet Take 0.5 mg by mouth every 6 (six) hours as needed.    [provider]  metFORMIN  (GLUCOPHAGE ) 500 MG tablet Take 500 mg by mouth every morning. 02/27/24   [provider]  montelukast  (SINGULAIR ) 10 MG tablet Take 1 tablet (10 mg total) by mouth at bedtime. 04/04/22   Berneta Elsie Sayre, MD  Multiple Vitamin (MULTIVITAMIN WITH MINERALS) TABS tablet Take 1 tablet by mouth daily.    [provider]  nicotine  (NICODERM CQ  - DOSED IN MG/24 HOURS) 14 mg/24hr patch Place 1 patch (14 mg total) onto the skin daily. Patient not taking: Reported on 05/23/2024 04/25/24   Neda Jennet LABOR, MD  nicotine  polacrilex (COMMIT) 4 MG lozenge Take 1 lozenge (4 mg total) by mouth  every 2 (two) hours as needed for smoking cessation. 04/25/24   Olalere, Jennet LABOR, MD  Omega-3 Fatty Acids (FISH OIL) 1000 MG CPDR Take 1,000 mg by mouth daily.    [provider]  omeprazole  (PRILOSEC) 40 MG capsule TAKE 1 CAPSULE EVERY DAY 04/06/23   Berneta Elsie Sayre, MD  polyethylene glycol powder (GLYCOLAX /MIRALAX ) 17 GM/SCOOP powder Dissolve 1 scoop of medication in water daily as needed for constipation. 12/22/21   Berneta Elsie Sayre, MD  QUEtiapine  (SEROQUEL ) 50 MG tablet TAKE 1 TABLET BY MOUTH AT BEDTIME 06/02/24   Prentiss Frieze, DO  rosuvastatin  (CRESTOR ) 40 MG tablet TAKE 1/2 TABLET AT BEDTIME 06/08/23   Berneta Elsie Sayre, MD  sertraline  (ZOLOFT ) 100 MG tablet Take 2 tablets by mouth once daily 11/10/23   Berneta Elsie Sayre, MD    Allergies: Penicillins, Lisinopril , Lovastatin, and Other    Review of Systems  Respiratory:  Positive for shortness of breath.     Updated Vital Signs BP (!) 137/54   Pulse 69   Temp 98 F (36.7 C) (Oral)   Resp 18   Ht 5' 2 (1.575 m)   Wt 65.2 kg   SpO2 93%   BMI 26.28 kg/m   Physical Exam Constitutional:      General: She is not in acute distress. HENT:     Head: Normocephalic and atraumatic.     Mouth/Throat:     Mouth: Mucous membranes are moist.  Eyes:     Pupils: Pupils are equal, round, and reactive to light.  Cardiovascular:     Rate and Rhythm: Normal rate and regular rhythm.  Pulmonary:     Effort: Pulmonary effort is normal.     Breath sounds: No decreased breath sounds.  Abdominal:     Palpations: Abdomen is soft.  Musculoskeletal:        General: Normal range of motion.     Cervical back: Normal range of motion.     Right lower leg: No edema.     Left lower leg: No edema.  Skin:    General: Skin is warm.     Capillary Refill: Capillary refill takes less than 2 seconds.  Neurological:     General: No focal deficit present.     Mental Status: She is alert.  Psychiatric:        Mood and Affect: Mood normal.     (all labs ordered are listed, but only abnormal results are displayed) Labs Reviewed  BASIC METABOLIC PANEL WITH GFR - Abnormal; Notable for the following components:      Result Value   Sodium 146 (*)    All other components within normal limits  CBC  PRO BRAIN NATRIURETIC PEPTIDE  LIPASE, BLOOD  URINALYSIS, ROUTINE W REFLEX MICROSCOPIC  TROPONIN T, HIGH SENSITIVITY  TROPONIN T, HIGH SENSITIVITY     EKG: EKG Interpretation Date/Time:  Thursday July 25 2024 12:51:29 EST Ventricular Rate:  67 PR Interval:  152 QRS Duration:  74 QT Interval:  448 QTC Calculation: 473 R Axis:   21  Text Interpretation: Normal sinus rhythm Normal ECG When compared with ECG of 01-Jun-2024 15:45, PREVIOUS ECG IS PRESENT Confirmed by Garrick Charleston [phone]) on 07/25/2024 12:56:28 PM  Radiology: DG Chest 2 View Result Date: 07/25/2024 CLINICAL DATA:  Shortness of breath, chest pain EXAM: CHEST - 2 VIEW COMPARISON:  May 07, 2024 FINDINGS: The heart size and mediastinal contours are within normal limits. Both lungs are clear. The visualized skeletal structures are unremarkable.  IMPRESSION: No active cardiopulmonary disease. Electronically Signed   By: Lynwood Landy Raddle M.D.   On: 07/25/2024 14:03    Procedures   Medications Ordered in the ED - No data to display                                 Medical Decision Making Amount and/or Complexity of Data Reviewed Labs: ordered. Radiology: ordered.  Risk Prescription drug management.   Patient is a 74 year old female who presents today for evaluation multiple complaints.  On initial assessment patient was noted to be hemodynamically stable and afebrile.  On my bedside assessment patient was noted to be resting comfortably without acute distress.  Physical examination with trace expiratory wheezing heard on my pulmonary auscultation.  Respiratory effort otherwise unlabored.  Nonfocal neurologic examination.  Mild diffuse tenderness to palpation without any rigidity, rebound or guarding.  Reviewed patient's laboratory evaluation without any acute findings including CBC, metabolic panel, lipase, troponin, BNP.  CT abdomen pelvis without any evidence of acute abnormalities.  Chest x-ray also without any evidence of acute cardiac or pulmonary abnormalities.  Patient remained clinically and hemodynamically stable during time in the emergency  department..  Low concern at this time for any acute process related to her presenting symptoms.  Headache seems to be chronic in nature without any evidence of CVA, ICH, acute trauma.  Low concerns at this time for any acute intra-abdominal pathology such as pancreatitis, pyelonephritis, cholecystitis related to her abdominal pain.  Patient remained clinically and hemodynamically stable on room air during time in the emergency department without any increase of work of breathing.  Patient feels stable for discharge at this point.  Patient has help at home with her grandchildren and does have home health nurse as well as home health PT.  Patient currently has difficulty obtaining water due to pursed pipes in her home from recent cold weather however does have bottled water available.   Final diagnoses:  Shortness of breath    ED Discharge Orders     None          Laurita Sieving, MD 07/25/24 2332    Ruthe Cornet, DO 07/25/24 2341  "

## 2024-07-25 NOTE — ED Provider Triage Note (Signed)
 Emergency Medicine Provider Triage Evaluation Note  Tiffany Murphy , a 74 y.o. female  was evaluated in triage.  Pt complains of chest pain, shortness of breath, abdominal pain.  Notes symptoms ongoing for a while.  She believes this has been 2 to 3 weeks.  Also notes weight loss.  States she has been nauseous but has not vomited.  Notes water at her house and states feels like this is making her worse.  States had flu twice in January.  Review of Systems  Positive: Shortness of breath, chest pain, abdominal pain, nausea, diarrhea Negative: Fevers, headache, dizziness, syncope  Physical Exam  BP (!) 137/54   Pulse 69   Temp 98 F (36.7 C) (Oral)   Resp 18   Ht 5' 2 (1.575 m)   Wt 65.2 kg   SpO2 93%   BMI 26.28 kg/m  Gen:   Awake, no distress   Resp:  Normal effort  MSK:   Moves extremities without difficulty  Other:    Medical Decision Making  Medically screening exam initiated at 1:55 PM.  Appropriate orders placed.  Tiffany Murphy was informed that the remainder of the evaluation will be completed by another provider, this initial triage assessment does not replace that evaluation, and the importance of remaining in the ED until their evaluation is complete.     Tiffany Thersia RAMAN, PA-C 07/25/24 1401

## 2024-07-25 NOTE — Progress Notes (Signed)
 Tiffany Murphy is a 74 yo f with recently diagnosed dementia who presented to the ED for evaluation of SOB, CP, cough, and N/D.   CSW initially consulted d/t concerning reports of patient living at home, alone, without water, and with sudden weight loss.   CSW met with patient at bedside. She is A&O x4. She shares she lives with her daughter Tiffany Murphy, granddaughter, and occasionally their partners. They both recently moved in to help her after she received a dx of dementia in December. She does endorse some conflict between family members as they adjust to their living situation, but confirms she is well-cared for. The water is currently shut off at the home d/t the pipes freezing, but someone in the family is a nutritional therapist and they are working on addressing the situation. They have been able to go to a nearby family member's house for water, showers, dishes, etc.   CSW called patient's son, Tiffany Murphy. He is currently in [place] but normally lives here. He confirms the above, shares no concerns about patient returning home with current family support.   CSW called patient's daughter, Tiffany Murphy. She confirms she will be able to pick the patient up tonight and stay with her if/when she is discharged.   Nursing/EDP updated.   Patient also requesting resources for placement service, states family dynamics have stressed her and she is thinking about moving into a ILF/ALF. Resources for [facility] provided and reviewed. Encouraged her to discuss with her family, though she fears they may be upset upon learning she is thinking about this. She was tearful during this discussion. Support provided.

## 2024-07-31 ENCOUNTER — Encounter: Payer: Self-pay | Admitting: Psychology

## 2024-08-01 ENCOUNTER — Ambulatory Visit: Admitting: Dermatology

## 2024-08-05 ENCOUNTER — Encounter: Admitting: Family Medicine

## 2024-08-16 ENCOUNTER — Ambulatory Visit

## 2024-08-16 ENCOUNTER — Institutional Professional Consult (permissible substitution): Admitting: Psychology

## 2024-08-23 ENCOUNTER — Encounter: Admitting: Psychology

## 2024-09-03 ENCOUNTER — Ambulatory Visit: Admitting: Pulmonary Disease

## 2024-11-21 ENCOUNTER — Ambulatory Visit: Admitting: Physician Assistant
